# Patient Record
Sex: Female | Born: 1940 | Race: White | Hispanic: No | Smoking: Former smoker
Health system: Southern US, Community
[De-identification: ages and names within clinical notes are randomized; demographics above are authoritative.]

## PROBLEM LIST (undated history)

## (undated) DIAGNOSIS — C73 Malignant neoplasm of thyroid gland: Secondary | ICD-10-CM

## (undated) DIAGNOSIS — I10 Essential (primary) hypertension: Secondary | ICD-10-CM

## (undated) DIAGNOSIS — J45909 Unspecified asthma, uncomplicated: Secondary | ICD-10-CM

## (undated) DIAGNOSIS — M1990 Unspecified osteoarthritis, unspecified site: Secondary | ICD-10-CM

## (undated) DIAGNOSIS — K625 Hemorrhage of anus and rectum: Secondary | ICD-10-CM

## (undated) DIAGNOSIS — K900 Celiac disease: Principal | ICD-10-CM

## (undated) DIAGNOSIS — J449 Chronic obstructive pulmonary disease, unspecified: Secondary | ICD-10-CM

## (undated) DIAGNOSIS — D649 Anemia, unspecified: Secondary | ICD-10-CM

## (undated) HISTORY — PX: ABDOMINAL HYSTERECTOMY: SHX81

## (undated) HISTORY — PX: TONSILLECTOMY: SUR1361

## (undated) HISTORY — PX: CHOLECYSTECTOMY: SHX55

## (undated) HISTORY — PX: BREAST EXCISIONAL BIOPSY: SUR124

## (undated) HISTORY — DX: Celiac disease: K90.0

## (undated) SURGERY — Surgical Case
Anesthesia: *Unknown

---

## 1999-09-28 ENCOUNTER — Encounter: Admission: RE | Admit: 1999-09-28 | Discharge: 1999-09-28 | Payer: Self-pay | Admitting: Family Medicine

## 1999-09-28 ENCOUNTER — Encounter: Payer: Self-pay | Admitting: Family Medicine

## 2000-08-07 ENCOUNTER — Encounter: Admission: RE | Admit: 2000-08-07 | Discharge: 2000-08-07 | Payer: Self-pay | Admitting: Family Medicine

## 2000-08-07 ENCOUNTER — Encounter: Payer: Self-pay | Admitting: Family Medicine

## 2002-03-12 ENCOUNTER — Encounter: Admission: RE | Admit: 2002-03-12 | Discharge: 2002-03-12 | Payer: Self-pay | Admitting: Family Medicine

## 2002-03-12 ENCOUNTER — Encounter: Payer: Self-pay | Admitting: Family Medicine

## 2002-07-08 ENCOUNTER — Encounter (INDEPENDENT_AMBULATORY_CARE_PROVIDER_SITE_OTHER): Payer: Self-pay | Admitting: *Deleted

## 2002-07-08 ENCOUNTER — Ambulatory Visit (HOSPITAL_COMMUNITY): Admission: RE | Admit: 2002-07-08 | Discharge: 2002-07-08 | Payer: Self-pay | Admitting: Gastroenterology

## 2004-02-25 ENCOUNTER — Encounter: Admission: RE | Admit: 2004-02-25 | Discharge: 2004-02-25 | Payer: Self-pay | Admitting: Family Medicine

## 2004-10-26 ENCOUNTER — Encounter: Admission: RE | Admit: 2004-10-26 | Discharge: 2004-10-26 | Payer: Self-pay | Admitting: Family Medicine

## 2005-11-17 ENCOUNTER — Encounter: Admission: RE | Admit: 2005-11-17 | Discharge: 2005-11-17 | Payer: Self-pay | Admitting: Family Medicine

## 2006-02-13 ENCOUNTER — Encounter: Admission: RE | Admit: 2006-02-13 | Discharge: 2006-02-13 | Payer: Self-pay | Admitting: Family Medicine

## 2006-12-14 ENCOUNTER — Encounter: Admission: RE | Admit: 2006-12-14 | Discharge: 2006-12-14 | Payer: Self-pay | Admitting: Family Medicine

## 2007-12-11 ENCOUNTER — Encounter: Admission: RE | Admit: 2007-12-11 | Discharge: 2007-12-11 | Payer: Self-pay | Admitting: Family Medicine

## 2007-12-25 ENCOUNTER — Other Ambulatory Visit: Admission: RE | Admit: 2007-12-25 | Discharge: 2007-12-25 | Payer: Self-pay | Admitting: Interventional Radiology

## 2007-12-25 ENCOUNTER — Encounter: Admission: RE | Admit: 2007-12-25 | Discharge: 2007-12-25 | Payer: Self-pay | Admitting: Family Medicine

## 2007-12-25 ENCOUNTER — Encounter (INDEPENDENT_AMBULATORY_CARE_PROVIDER_SITE_OTHER): Payer: Self-pay | Admitting: Interventional Radiology

## 2008-01-16 ENCOUNTER — Encounter: Admission: RE | Admit: 2008-01-16 | Discharge: 2008-01-16 | Payer: Self-pay | Admitting: Family Medicine

## 2008-03-25 ENCOUNTER — Encounter (INDEPENDENT_AMBULATORY_CARE_PROVIDER_SITE_OTHER): Payer: Self-pay | Admitting: Surgery

## 2008-03-25 ENCOUNTER — Ambulatory Visit (HOSPITAL_COMMUNITY): Admission: RE | Admit: 2008-03-25 | Discharge: 2008-03-26 | Payer: Self-pay | Admitting: Surgery

## 2008-03-31 ENCOUNTER — Encounter: Admission: RE | Admit: 2008-03-31 | Discharge: 2008-03-31 | Payer: Self-pay | Admitting: Family Medicine

## 2008-06-08 ENCOUNTER — Encounter (HOSPITAL_COMMUNITY): Admission: RE | Admit: 2008-06-08 | Discharge: 2008-09-02 | Payer: Self-pay | Admitting: Internal Medicine

## 2008-08-11 ENCOUNTER — Encounter: Admission: RE | Admit: 2008-08-11 | Discharge: 2008-08-11 | Payer: Self-pay | Admitting: Internal Medicine

## 2008-10-05 ENCOUNTER — Encounter: Admission: RE | Admit: 2008-10-05 | Discharge: 2008-10-05 | Payer: Self-pay | Admitting: Internal Medicine

## 2008-11-26 ENCOUNTER — Ambulatory Visit (HOSPITAL_BASED_OUTPATIENT_CLINIC_OR_DEPARTMENT_OTHER): Admission: RE | Admit: 2008-11-26 | Discharge: 2008-11-26 | Payer: Self-pay | Admitting: Orthopedic Surgery

## 2009-02-04 ENCOUNTER — Ambulatory Visit (HOSPITAL_COMMUNITY): Admission: RE | Admit: 2009-02-04 | Discharge: 2009-02-05 | Payer: Self-pay | Admitting: Obstetrics and Gynecology

## 2009-03-02 ENCOUNTER — Encounter: Admission: RE | Admit: 2009-03-02 | Discharge: 2009-03-02 | Payer: Self-pay | Admitting: Family Medicine

## 2009-04-28 ENCOUNTER — Encounter: Admission: RE | Admit: 2009-04-28 | Discharge: 2009-06-04 | Payer: Self-pay | Admitting: Sports Medicine

## 2009-04-29 ENCOUNTER — Encounter: Admission: RE | Admit: 2009-04-29 | Discharge: 2009-04-29 | Payer: Self-pay | Admitting: Internal Medicine

## 2009-06-19 ENCOUNTER — Emergency Department (HOSPITAL_COMMUNITY): Admission: EM | Admit: 2009-06-19 | Discharge: 2009-06-19 | Payer: Self-pay | Admitting: Emergency Medicine

## 2009-07-21 ENCOUNTER — Ambulatory Visit (HOSPITAL_BASED_OUTPATIENT_CLINIC_OR_DEPARTMENT_OTHER): Admission: RE | Admit: 2009-07-21 | Discharge: 2009-07-22 | Payer: Self-pay | Admitting: Orthopedic Surgery

## 2009-09-03 ENCOUNTER — Encounter: Admission: RE | Admit: 2009-09-03 | Discharge: 2009-09-23 | Payer: Self-pay | Admitting: Orthopedic Surgery

## 2009-09-27 ENCOUNTER — Encounter: Admission: RE | Admit: 2009-09-27 | Discharge: 2009-10-01 | Payer: Self-pay | Admitting: Orthopedic Surgery

## 2009-10-25 ENCOUNTER — Encounter (HOSPITAL_COMMUNITY): Admission: RE | Admit: 2009-10-25 | Discharge: 2009-12-29 | Payer: Self-pay | Admitting: Internal Medicine

## 2010-03-03 ENCOUNTER — Encounter: Admission: RE | Admit: 2010-03-03 | Discharge: 2010-03-03 | Payer: Self-pay | Admitting: Advanced Practice Midwife

## 2010-07-19 ENCOUNTER — Encounter: Admission: RE | Admit: 2010-07-19 | Discharge: 2010-07-19 | Payer: Self-pay | Admitting: Orthopedic Surgery

## 2010-07-20 ENCOUNTER — Ambulatory Visit (HOSPITAL_BASED_OUTPATIENT_CLINIC_OR_DEPARTMENT_OTHER): Admission: RE | Admit: 2010-07-20 | Discharge: 2010-07-20 | Payer: Self-pay | Admitting: Orthopedic Surgery

## 2010-08-01 ENCOUNTER — Encounter
Admission: RE | Admit: 2010-08-01 | Discharge: 2010-09-20 | Payer: Self-pay | Source: Home / Self Care | Attending: Orthopedic Surgery | Admitting: Orthopedic Surgery

## 2010-09-27 ENCOUNTER — Encounter: Admission: RE | Admit: 2010-09-27 | Payer: Self-pay | Source: Home / Self Care | Admitting: Orthopedic Surgery

## 2010-10-16 ENCOUNTER — Encounter: Payer: Self-pay | Admitting: Internal Medicine

## 2010-11-18 ENCOUNTER — Other Ambulatory Visit: Payer: Self-pay | Admitting: Internal Medicine

## 2010-11-18 DIAGNOSIS — C73 Malignant neoplasm of thyroid gland: Secondary | ICD-10-CM

## 2010-11-24 ENCOUNTER — Ambulatory Visit
Admission: RE | Admit: 2010-11-24 | Discharge: 2010-11-24 | Disposition: A | Discharge status: Home / Self Care | Payer: Medicare Other | Source: Ambulatory Visit | Attending: Internal Medicine | Admitting: Internal Medicine

## 2010-11-24 DIAGNOSIS — C73 Malignant neoplasm of thyroid gland: Secondary | ICD-10-CM

## 2010-12-07 LAB — BASIC METABOLIC PANEL
Calcium: 8.7 mg/dL (ref 8.4–10.5)
Creatinine, Ser: 0.71 mg/dL (ref 0.4–1.2)
GFR calc Af Amer: >60 mL/min (ref >60)
GFR calc non Af Amer: >60 mL/min (ref >60)
Glucose, Bld: 132 mg/dL — ABNORMAL HIGH (ref 70–99)
Sodium: 138 mEq/L (ref 135–145)

## 2010-12-07 LAB — POCT HEMOGLOBIN-HEMACUE: Hemoglobin: 10.5 g/dL — ABNORMAL LOW (ref 12.0–15.0)

## 2010-12-14 LAB — THYROGLOBULIN LEVEL
Antithyroglobulin Ab: <20 IU/mL (ref <20)
Thyroglobulin: <0.2 ng/mL — ABNORMAL LOW (ref 2.0–35.0)

## 2010-12-29 LAB — BASIC METABOLIC PANEL
Chloride: 104 mEq/L (ref 96–112)
GFR calc non Af Amer: >60 mL/min (ref >60)

## 2010-12-30 LAB — URINE MICROSCOPIC-ADD ON

## 2010-12-30 LAB — DIFFERENTIAL
Eosinophils Absolute: 0.2 10*3/uL (ref 0.0–0.7)
Lymphocytes Relative: 34 % (ref 12–46)
Lymphs Abs: 2.5 10*3/uL (ref 0.7–4.0)
Monocytes Relative: 9 % (ref 3–12)
Neutrophils Relative %: 54 % (ref 43–77)

## 2010-12-30 LAB — BASIC METABOLIC PANEL
Chloride: 98 mEq/L (ref 96–112)
Creatinine, Ser: 0.65 mg/dL (ref 0.4–1.2)
GFR calc Af Amer: >60 mL/min (ref >60)
Potassium: 3.7 mEq/L (ref 3.5–5.1)

## 2010-12-30 LAB — URINALYSIS, ROUTINE W REFLEX MICROSCOPIC
Bilirubin Urine: NEGATIVE
Glucose, UA: NEGATIVE mg/dL
Ketones, ur: NEGATIVE mg/dL
pH: 6.5 (ref 5.0–8.0)

## 2010-12-30 LAB — CBC
MCV: 74.2 fL — ABNORMAL LOW (ref 78.0–100.0)
RBC: 4.73 MIL/uL (ref 3.87–5.11)
WBC: 7.3 10*3/uL (ref 4.0–10.5)

## 2011-01-03 LAB — CBC
HCT: 34.8 % — ABNORMAL LOW (ref 36.0–46.0)
HCT: 38.7 % (ref 36.0–46.0)
Hemoglobin: 11.9 g/dL — ABNORMAL LOW (ref 12.0–15.0)
Hemoglobin: 13.1 g/dL (ref 12.0–15.0)
MCHC: 33.8 g/dL (ref 30.0–36.0)
MCV: 85.3 fL (ref 78.0–100.0)
RBC: 4.54 MIL/uL (ref 3.87–5.11)
WBC: 11.3 10*3/uL — ABNORMAL HIGH (ref 4.0–10.5)

## 2011-01-03 LAB — COMPREHENSIVE METABOLIC PANEL
BUN: 11 mg/dL (ref 6–23)
CO2: 32 mEq/L (ref 19–32)
Calcium: 9 mg/dL (ref 8.4–10.5)
Creatinine, Ser: 0.75 mg/dL (ref 0.4–1.2)
GFR calc non Af Amer: >60 mL/min (ref >60)
Glucose, Bld: 105 mg/dL — ABNORMAL HIGH (ref 70–99)
Total Bilirubin: 0.7 mg/dL (ref 0.3–1.2)

## 2011-01-05 LAB — BASIC METABOLIC PANEL
BUN: 13 mg/dL (ref 6–23)
Chloride: 101 mEq/L (ref 96–112)
Glucose, Bld: 93 mg/dL (ref 70–99)
Potassium: 3.9 mEq/L (ref 3.5–5.1)
Sodium: 137 mEq/L (ref 135–145)

## 2011-01-05 LAB — POCT HEMOGLOBIN-HEMACUE: Hemoglobin: 12.8 g/dL (ref 12.0–15.0)

## 2011-02-07 NOTE — Op Note (Signed)
NAME:  Angelica [name], Angelica [name]                 ACCOUNT NO.:  [id number]   MEDICAL RECORD NO.:  [id number]          PATIENT TYPE:  OIB   LOCATION:  3887                          FACILITY:  [facility]   PHYSICIAN:  Charles A. Delcambre, MDDATE OF BIRTH:  09-08-1941   DATE OF PROCEDURE:  02/04/2009  DATE OF DISCHARGE:                               OPERATIVE REPORT   PREOPERATIVE DIAGNOSIS:  Cystocele.   POSTOPERATIVE DIAGNOSIS:  Cystocele.   PROCEDURE:  Anterior repair.   SURGEON:  Charles A. Bing Matter, MD.   ASSISTANT:  Dr. Landry Mellow.   ANESTHESIA:  General, via the endotracheal route.   FINDINGS:  Large cystocele.   SPECIMENS:  None.   COMPLICATIONS:  None.   ESTIMATED BLOOD LOSS:  Less than or equal to 50 mL.   Instrument, sponge, and needle counts were correct x2.   DESCRIPTION OF PROCEDURE:  The patient was taken to the operating room  and placed in supine position.  General anesthetic was induced without  difficulty and she was then placed in dorsal lithotomy position.  Sterile prep and drape was undertaken.  Foley catheter was placed.  Cuff  was grasped with Allis clamps.  10 mL of 1% lidocaine with epinephrine  was injected subcutaneously in the vaginal mucosa for hemostasis.  A  knife was used to cut across from corner to corner and bladder mucosa  was undermined with Metzenbaum scissors.  Vaginal mucosa was incised in  the midline, bladder was not injured.  The bladder was taken off the  vaginal mucosa with the knife and Metzenbaum scissors, easily reducing  the hard cystocele and without injury to the bladder.  Endopelvic fascia  was then implicated with 7 interrupted 2-0 Vicryl sutures giving a nice  support to the bladder and reduction of the  cystocele.  The bladder mucosa was then excised and the bladder mucosa  was closed with running, locking 2-0 Vicryl with good hemostasis.  1  inch gauze with Estrace cream was placed in the vagina for pack.  The  patient was awakened,  extubated, and taken to recovery with physician in  attendance having tolerated the procedure well.      Charles A. Bing Matter, MD  Electronically Signed     CAD/MEDQ  D:  02/04/2009  T:  02/05/2009  Job:  [id number]

## 2011-02-07 NOTE — Op Note (Signed)
NAME:  DELORIA, BRASSFIELD                 ACCOUNT NO.:  [id number]   MEDICAL RECORD NO.:  [id number]          PATIENT TYPE:  OIB   LOCATION:  5123                         FACILITY:  [facility]   PHYSICIAN:  Earnstine Regal, MD      DATE OF BIRTH:  [birth year]   DATE OF PROCEDURE:  03/25/2008  DATE OF DISCHARGE:                               OPERATIVE REPORT   PREOPERATIVE DIAGNOSES:  Left thyroid nodule with cytologic atypia,  thyroid goiter.   POSTOPERATIVE DIAGNOSES:  Left thyroid nodule with cytologic atypia,  thyroid goiter.   PROCEDURE:  Total thyroidectomy.   SURGEON:  Earnstine Regal, MD, FACS   ASSISTANT:  Sharyn Dross, RNFA   ANESTHESIA:  General per Dr. Lillia Abed.   ESTIMATED BLOOD LOSS:  Minimal.   PREPARATION:  Betadine.   COMPLICATIONS:  None.   INDICATIONS:  The patient is a 70 year old white female from [place],  [place].  She is referred by Dr. Osvaldo Human for evaluation of  thyroid goiter with dominant left side thyroid nodule.  This had been  found on routine physical examination.  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  The patient was referred for  definitive diagnosis by thyroidectomy.   BODY OF REPORT:  Procedure was done in OR #60 at the [facility].  The patient was brought to the operating room,  placed in supine position on the operating room table.  Following  administration of general anesthesia, the patient was positioned and  then prepped and draped in the usual strict aseptic fashion.  After  ascertaining that an adequate level of anesthesia been achieved, a  Kocher incision was made with a #15 blade.  Dissection was carried  through subcutaneous tissues and platysma.  Hemostasis was obtained with  the electrocautery.  Skin flaps were elevated cephalad and caudad from  the thyroid notch to the sternal notch.  A Muller  self-retaining  retractor was placed for exposure.  The strap muscles were incised in  the midline.  Dissection was begun on the left side of the neck.  Left  thyroid lobe was markedly enlarged.  It contains several nodules, some  of which were quite hard and firm.  Using the gentle blunt dissection,  the lobe was mobilized.  Venous tributaries are divided between medium  hemoclips.  Superior pole vessels were dissected out and ligated in  continuity with 2-0 silk ties and divided with the harmonic scalpel.  The inferior venous tributaries were ligated in continuity with 2-0 silk  ties and divided with the harmonic scalpel.  Branches of the inferior  thyroid artery are divided between small and medium Ligaclip with the  harmonic scalpel.  Superior and inferior parathyroid glands were  identified and preserved.  Gland was rolled anteriorly.  Ligament of  Gwenlyn Found was transected with the electrocautery.  Gland was mobilized up  and onto the anterior surface of the trachea.  There was a small  pyramidal lobe which was included with the isthmus.  The isthmus was  mobilized across the midline.   Next, we turned our attention to the right thyroid lobe.  The right  thyroid lobe was much smaller.  It has subtle nodularity but no dominant  masses.  Lobe was gently mobilized with blunt dissection.  Superior pole  vessels were divided between medium Ligaclip with the harmonic scalpel.  Gland was rolled anteriorly.  Inferior venous tributaries were ligated  in continuity with 2-0 silk ties and divided with the harmonic scalpel.  Branches of the inferior thyroid artery were divided between small  Ligaclip with the harmonic scalpel.  Superior both parathyroid glands on  the right were identified and preserved on their vascular pedicle.  Ligament of Gwenlyn Found was transected with the electrocautery.  The remainder  of the gland was excised off the trachea using the harmonic scalpel.  The gland was completely  excised.  The suture was used to mark the left  superior pole.  Neck was irrigated with warm saline.  Surgicel was  placed in the base of the wound bilaterally.  Good hemostasis was noted.  Strap muscles were reapproximated in the midline with interrupted 3-0  Vicryl sutures.  Platysma was closed with interrupted 3-0 Vicryl  sutures.  Skin was closed with a running 4-0 Monocryl subcuticular  suture.  Wound was washed and dried.  Benzoin and Steri-Strips are  applied.  Sterile dressings were applied.  The patient was awakened from  anesthesia and brought to the recovery room in stable condition.  The  patient tolerated the procedure well.      Earnstine Regal, MD  Electronically Signed     TMG/MEDQ  D:  03/25/2008  T:  03/25/2008  Job:  [id number]   cc:   Osvaldo Human, M.D.

## 2011-02-07 NOTE — Op Note (Signed)
NAME:  Angelica [name], Angelica [name]                 ACCOUNT NO.:  [id number]   MEDICAL RECORD NO.:  [id number]          PATIENT TYPE:  AMB   LOCATION:  DSC                          FACILITY:  [facility]   PHYSICIAN:  Daryll Brod, M.D.       DATE OF BIRTH:  20-Aug-1941   DATE OF PROCEDURE:  DATE OF DISCHARGE:                               OPERATIVE REPORT   PREOPERATIVE DIAGNOSIS:  Carpal tunnel syndrome, right hand.   POSTOPERATIVE DIAGNOSIS:  Carpal tunnel syndrome, right hand.   OPERATION:  Decompression of right median nerve.   SURGEON:  Daryll Brod, MD   ASSISTANT:  None.   ANESTHESIA:  Forearm-based IV regional.   ANESTHESIOLOGIST:  Ala Dach, MD   HISTORY:  The patient is a 70 year old female with a history of carpal  tunnel syndrome, EMG nerve conductions positive which has not responded  to conservative treatment.  She has elected to undergo surgical  decompression of the median nerve.  Pre, peri, and postoperative course  have been discussed along with risks and complications.  She is aware  that there is no guarantee with surgery, possibility of infection,  recurrence of injury to arteries, nerves, tendons, complete relief of  symptoms, dystrophy.  In the preoperative area, the patient is seen, the  extremity marked by both the patient and surgeon, antibiotic given.   PROCEDURE:  The patient was brought to the operating room where a  forearm-based IV regional anesthetic was carried out without difficulty.  She was prepped using DuraPrep, supine position with the right arm free.  A time-out was taken.  A longitudinal incision was made in the palm,  carried down through subcutaneous tissue.  Bleeders were  electrocauterized.  Palmar fascia was split.  Superficial palmar arch  identified.  The flexor tendon to the ring little finger identified to  the ulnar side of the median nerve.  The carpal retinaculum was incised  with sharp dissection.  A right angle and Sewell retractor were  placed  between skin and forearm fascia.  The fascia released for approximately  a centimeter and half proximal to the wrist crease under direct vision.  Area of compression to the nerve was apparent with an hourglass  deformity.  No further lesions were identified.  The wound was  irrigated.  The skin was  then closed with interrupted 5-0 Vicryl Rapide sutures.  A sterile  compressive dressing and splint to the wrist was applied.  The patient  tolerated the procedure well and was taken to the recovery room for  observation in satisfactory condition.  She will be discharged home to  return to the [facility] in 1 week on Vicodin.           ______________________________  Daryll Brod, M.D.     GK/MEDQ  D:  11/26/2008  T:  11/27/2008  Job:  [id number]   cc:   Osvaldo Human, M.D.

## 2011-02-14 ENCOUNTER — Other Ambulatory Visit: Payer: Self-pay | Admitting: Family Medicine

## 2011-02-14 DIAGNOSIS — Z1231 Encounter for screening mammogram for malignant neoplasm of breast: Secondary | ICD-10-CM

## 2011-02-14 NOTE — Op Note (Signed)
NAME:  Angelica [name], Angelica [name]                           ACCOUNT NO.:  [id number]   MEDICAL RECORD NO.:  [id number]                   PATIENT TYPE:  AMB   LOCATION:  ENDO                                 FACILITY:  [facility]   PHYSICIAN:  Earle Gell, M.D.                DATE OF BIRTH:  1940/10/15   DATE OF PROCEDURE:  07/08/2002  DATE OF DISCHARGE:                                 OPERATIVE REPORT   PROCEDURE:  Screening colonoscopy with rectal polypectomy.   PROCEDURE INDICATION:  [name] is [name] 70 year old female born  12-19-1940.  [name] is scheduled to undergo her first screening  colonoscopy with polypectomy to prevent colon cancer.  I discussed with her  the complications associated with colonoscopy and polypectomy, including [name]  15 per 1,000 risk of bleeding and 4 per 1,000 risk of colon perforation  requiring surgical repair.  [name] has signed the operative permit.   ENDOSCOPIST:  Earle Gell, M.D.   PREMEDICATION:  Versed 7 mg, fentanyl 50 mcg.   ENDOSCOPE:  Olympus pediatric colonoscope.   PROCEDURE:  After obtaining informed consent, [name] was placed in the  left lateral decubitus position.  I administered intravenous fentanyl and  intravenous Versed to achieve conscious sedation for the procedure.  The  patient's blood pressure, oxygen saturation, and cardiac rhythm were  monitored throughout the procedure and documented in the medical record.   Anal inspection was normal.  Digital rectal exam was normal.  The Olympus  pediatric video colonoscope was introduced into the rectum and advanced to  the cecum.  Colonic preparation for the exam today was excellent.   RECTUM:  [name] 3 mm sessile polyp was removed from the distal rectum with the  electrocautery snare after saline lift; two 1 mm sessile polyps were removed  from the proximal rectum with the cold biopsy forceps.  All polyps were  submitted in one bottle for pathologic evaluation.   SIGMOID  COLON AND DESCENDING COLON:  Left colonic diverticulosis.   SPLENIC FLEXURE:  Normal.   TRANSVERSE COLON:  Normal.   HEPATIC FLEXURE:  Normal.   ASCENDING COLON:  Normal.   CECUM AND ILEOCECAL VALVE:  Normal.   ASSESSMENT:  1. Left colonic diverticulosis.  2. Three small sessile polyps were removed from the rectum and submitted in     one bottle for pathologic evaluation.   RECOMMENDATIONS:  If rectal polyps return neoplastic pathologically, Ms.  Angelica [name] should undergo [name] repeat colonoscopy in 3-5 years.                                               Earle Gell, M.D.    MJ/MEDQ  D:  07/08/2002  T:  07/08/2002  Job:  [id number]   cc:  Osvaldo Human, MD

## 2011-02-27 ENCOUNTER — Ambulatory Visit
Admission: RE | Admit: 2011-02-27 | Discharge: 2011-02-27 | Disposition: A | Discharge status: Home / Self Care | Payer: Medicare Other | Source: Ambulatory Visit | Attending: Family Medicine | Admitting: Family Medicine

## 2011-02-27 DIAGNOSIS — Z1231 Encounter for screening mammogram for malignant neoplasm of breast: Secondary | ICD-10-CM

## 2011-03-06 ENCOUNTER — Ambulatory Visit: Payer: Medicare Other

## 2011-06-22 LAB — CBC
HCT: 43.7
Hemoglobin: 14.8
RBC: 4.87
RDW: 13.6

## 2011-06-22 LAB — DIFFERENTIAL
Basophils Absolute: 0.1
Eosinophils Relative: 4
Lymphocytes Relative: 38
Monocytes Absolute: 0.7
Monocytes Relative: 8
Neutro Abs: 4.2

## 2011-06-22 LAB — BASIC METABOLIC PANEL
BUN: 11
Chloride: 99
GFR calc non Af Amer: >60
Glucose, Bld: 100 — ABNORMAL HIGH
Potassium: 3.5
Sodium: 138

## 2011-06-22 LAB — URINALYSIS, ROUTINE W REFLEX MICROSCOPIC
Glucose, UA: NEGATIVE
Hgb urine dipstick: NEGATIVE
Protein, ur: NEGATIVE
Specific Gravity, Urine: 1.016
pH: 6

## 2011-06-22 LAB — CALCIUM
Calcium: 9
Calcium: 9

## 2011-06-22 LAB — CALCITONIN: Calcitonin: <2 pg/mL (ref <13.0)

## 2011-10-04 DIAGNOSIS — J441 Chronic obstructive pulmonary disease with (acute) exacerbation: Secondary | ICD-10-CM | POA: Diagnosis not present

## 2011-11-02 DIAGNOSIS — D509 Iron deficiency anemia, unspecified: Secondary | ICD-10-CM | POA: Diagnosis not present

## 2011-11-06 ENCOUNTER — Other Ambulatory Visit: Payer: Self-pay | Admitting: Internal Medicine

## 2011-11-06 DIAGNOSIS — C73 Malignant neoplasm of thyroid gland: Secondary | ICD-10-CM

## 2011-11-10 ENCOUNTER — Ambulatory Visit
Admission: RE | Admit: 2011-11-10 | Discharge: 2011-11-10 | Disposition: A | Discharge status: Home / Self Care | Payer: Medicare Other | Source: Ambulatory Visit | Attending: Internal Medicine | Admitting: Internal Medicine

## 2011-11-10 DIAGNOSIS — C73 Malignant neoplasm of thyroid gland: Secondary | ICD-10-CM | POA: Diagnosis not present

## 2011-11-10 DIAGNOSIS — Z09 Encounter for follow-up examination after completed treatment for conditions other than malignant neoplasm: Secondary | ICD-10-CM | POA: Diagnosis not present

## 2011-11-13 DIAGNOSIS — C73 Malignant neoplasm of thyroid gland: Secondary | ICD-10-CM | POA: Diagnosis not present

## 2011-11-13 DIAGNOSIS — E890 Postprocedural hypothyroidism: Secondary | ICD-10-CM | POA: Diagnosis not present

## 2011-11-15 DIAGNOSIS — C73 Malignant neoplasm of thyroid gland: Secondary | ICD-10-CM | POA: Diagnosis not present

## 2011-11-15 DIAGNOSIS — Z833 Family history of diabetes mellitus: Secondary | ICD-10-CM | POA: Diagnosis not present

## 2011-11-15 DIAGNOSIS — R21 Rash and other nonspecific skin eruption: Secondary | ICD-10-CM | POA: Diagnosis not present

## 2011-11-15 DIAGNOSIS — E890 Postprocedural hypothyroidism: Secondary | ICD-10-CM | POA: Diagnosis not present

## 2011-11-15 DIAGNOSIS — R7301 Impaired fasting glucose: Secondary | ICD-10-CM | POA: Diagnosis not present

## 2012-01-08 DIAGNOSIS — F329 Major depressive disorder, single episode, unspecified: Secondary | ICD-10-CM | POA: Diagnosis not present

## 2012-01-08 DIAGNOSIS — E7800 Pure hypercholesterolemia, unspecified: Secondary | ICD-10-CM | POA: Diagnosis not present

## 2012-01-08 DIAGNOSIS — I10 Essential (primary) hypertension: Secondary | ICD-10-CM | POA: Diagnosis not present

## 2012-01-08 DIAGNOSIS — E669 Obesity, unspecified: Secondary | ICD-10-CM | POA: Diagnosis not present

## 2012-02-12 ENCOUNTER — Other Ambulatory Visit: Payer: Self-pay | Admitting: Family Medicine

## 2012-02-12 DIAGNOSIS — Z1231 Encounter for screening mammogram for malignant neoplasm of breast: Secondary | ICD-10-CM

## 2012-02-29 ENCOUNTER — Ambulatory Visit
Admission: RE | Admit: 2012-02-29 | Discharge: 2012-02-29 | Disposition: A | Discharge status: Home / Self Care | Payer: Medicare Other | Source: Ambulatory Visit | Attending: Family Medicine | Admitting: Family Medicine

## 2012-02-29 DIAGNOSIS — Z1231 Encounter for screening mammogram for malignant neoplasm of breast: Secondary | ICD-10-CM

## 2012-05-14 DIAGNOSIS — R7301 Impaired fasting glucose: Secondary | ICD-10-CM | POA: Diagnosis not present

## 2012-05-14 DIAGNOSIS — E890 Postprocedural hypothyroidism: Secondary | ICD-10-CM | POA: Diagnosis not present

## 2012-05-14 DIAGNOSIS — C73 Malignant neoplasm of thyroid gland: Secondary | ICD-10-CM | POA: Diagnosis not present

## 2012-05-17 DIAGNOSIS — Z833 Family history of diabetes mellitus: Secondary | ICD-10-CM | POA: Diagnosis not present

## 2012-05-17 DIAGNOSIS — R0789 Other chest pain: Secondary | ICD-10-CM | POA: Diagnosis not present

## 2012-05-17 DIAGNOSIS — C73 Malignant neoplasm of thyroid gland: Secondary | ICD-10-CM | POA: Diagnosis not present

## 2012-05-17 DIAGNOSIS — R002 Palpitations: Secondary | ICD-10-CM | POA: Diagnosis not present

## 2012-05-17 DIAGNOSIS — R7301 Impaired fasting glucose: Secondary | ICD-10-CM | POA: Diagnosis not present

## 2012-05-17 DIAGNOSIS — E890 Postprocedural hypothyroidism: Secondary | ICD-10-CM | POA: Diagnosis not present

## 2012-06-12 DIAGNOSIS — R002 Palpitations: Secondary | ICD-10-CM | POA: Diagnosis not present

## 2012-06-12 DIAGNOSIS — R0789 Other chest pain: Secondary | ICD-10-CM | POA: Diagnosis not present

## 2012-06-12 DIAGNOSIS — E7800 Pure hypercholesterolemia, unspecified: Secondary | ICD-10-CM | POA: Diagnosis not present

## 2012-06-12 DIAGNOSIS — R9431 Abnormal electrocardiogram [ECG] [EKG]: Secondary | ICD-10-CM | POA: Diagnosis not present

## 2012-06-12 DIAGNOSIS — J449 Chronic obstructive pulmonary disease, unspecified: Secondary | ICD-10-CM | POA: Diagnosis not present

## 2012-06-12 DIAGNOSIS — I10 Essential (primary) hypertension: Secondary | ICD-10-CM | POA: Diagnosis not present

## 2012-06-20 DIAGNOSIS — R002 Palpitations: Secondary | ICD-10-CM | POA: Diagnosis not present

## 2012-06-20 DIAGNOSIS — R9431 Abnormal electrocardiogram [ECG] [EKG]: Secondary | ICD-10-CM | POA: Diagnosis not present

## 2012-06-20 DIAGNOSIS — R0789 Other chest pain: Secondary | ICD-10-CM | POA: Diagnosis not present

## 2012-06-20 DIAGNOSIS — J449 Chronic obstructive pulmonary disease, unspecified: Secondary | ICD-10-CM | POA: Diagnosis not present

## 2012-06-20 DIAGNOSIS — E7800 Pure hypercholesterolemia, unspecified: Secondary | ICD-10-CM | POA: Diagnosis not present

## 2012-06-20 DIAGNOSIS — I10 Essential (primary) hypertension: Secondary | ICD-10-CM | POA: Diagnosis not present

## 2012-06-28 DIAGNOSIS — C73 Malignant neoplasm of thyroid gland: Secondary | ICD-10-CM | POA: Diagnosis not present

## 2012-07-03 ENCOUNTER — Encounter (HOSPITAL_COMMUNITY): Payer: Self-pay | Admitting: Emergency Medicine

## 2012-07-03 ENCOUNTER — Inpatient Hospital Stay (HOSPITAL_COMMUNITY)
Admission: EM | Admit: 2012-07-03 | Discharge: 2012-07-06 | DRG: 812 | Disposition: A | Discharge status: Home / Self Care | Payer: Medicare Other | Attending: Internal Medicine | Admitting: Internal Medicine

## 2012-07-03 DIAGNOSIS — K449 Diaphragmatic hernia without obstruction or gangrene: Secondary | ICD-10-CM | POA: Diagnosis present

## 2012-07-03 DIAGNOSIS — E876 Hypokalemia: Secondary | ICD-10-CM | POA: Diagnosis not present

## 2012-07-03 DIAGNOSIS — J449 Chronic obstructive pulmonary disease, unspecified: Secondary | ICD-10-CM | POA: Diagnosis present

## 2012-07-03 DIAGNOSIS — Z85850 Personal history of malignant neoplasm of thyroid: Secondary | ICD-10-CM

## 2012-07-03 DIAGNOSIS — D649 Anemia, unspecified: Secondary | ICD-10-CM | POA: Diagnosis not present

## 2012-07-03 DIAGNOSIS — E669 Obesity, unspecified: Secondary | ICD-10-CM | POA: Diagnosis not present

## 2012-07-03 DIAGNOSIS — Z9981 Dependence on supplemental oxygen: Secondary | ICD-10-CM

## 2012-07-03 DIAGNOSIS — K921 Melena: Secondary | ICD-10-CM | POA: Diagnosis not present

## 2012-07-03 DIAGNOSIS — R531 Weakness: Secondary | ICD-10-CM

## 2012-07-03 DIAGNOSIS — Z66 Do not resuscitate: Secondary | ICD-10-CM | POA: Diagnosis present

## 2012-07-03 DIAGNOSIS — K219 Gastro-esophageal reflux disease without esophagitis: Secondary | ICD-10-CM | POA: Diagnosis present

## 2012-07-03 DIAGNOSIS — K5730 Diverticulosis of large intestine without perforation or abscess without bleeding: Secondary | ICD-10-CM | POA: Diagnosis present

## 2012-07-03 DIAGNOSIS — D509 Iron deficiency anemia, unspecified: Secondary | ICD-10-CM | POA: Diagnosis not present

## 2012-07-03 DIAGNOSIS — R079 Chest pain, unspecified: Secondary | ICD-10-CM | POA: Diagnosis not present

## 2012-07-03 DIAGNOSIS — R9430 Abnormal result of cardiovascular function study, unspecified: Secondary | ICD-10-CM | POA: Diagnosis not present

## 2012-07-03 DIAGNOSIS — J4489 Other specified chronic obstructive pulmonary disease: Secondary | ICD-10-CM | POA: Diagnosis present

## 2012-07-03 DIAGNOSIS — R5383 Other fatigue: Secondary | ICD-10-CM

## 2012-07-03 DIAGNOSIS — E7800 Pure hypercholesterolemia, unspecified: Secondary | ICD-10-CM | POA: Diagnosis not present

## 2012-07-03 DIAGNOSIS — I10 Essential (primary) hypertension: Secondary | ICD-10-CM

## 2012-07-03 HISTORY — DX: Chronic obstructive pulmonary disease, unspecified: J44.9

## 2012-07-03 HISTORY — DX: Unspecified asthma, uncomplicated: J45.909

## 2012-07-03 HISTORY — DX: Unspecified osteoarthritis, unspecified site: M19.90

## 2012-07-03 HISTORY — DX: Anemia, unspecified: D64.9

## 2012-07-03 HISTORY — DX: Essential (primary) hypertension: I10

## 2012-07-03 LAB — COMPREHENSIVE METABOLIC PANEL
ALT: 11 U/L (ref 0–35)
BUN: 11 mg/dL (ref 6–23)
CO2: 28 mEq/L (ref 19–32)
Calcium: 9.2 mg/dL (ref 8.4–10.5)
Creatinine, Ser: 0.68 mg/dL (ref 0.50–1.10)
GFR calc Af Amer: >90 mL/min (ref >90)
GFR calc non Af Amer: 85 mL/min — ABNORMAL LOW (ref >90)
Glucose, Bld: 101 mg/dL — ABNORMAL HIGH (ref 70–99)
Sodium: 137 mEq/L (ref 135–145)

## 2012-07-03 LAB — CBC
HCT: 28 % — ABNORMAL LOW (ref 36.0–46.0)
Hemoglobin: 7.5 g/dL — ABNORMAL LOW (ref 12.0–15.0)
MCH: 17.4 pg — ABNORMAL LOW (ref 26.0–34.0)
MCV: 65.1 fL — ABNORMAL LOW (ref 78.0–100.0)
RBC: 4.3 MIL/uL (ref 3.87–5.11)

## 2012-07-03 LAB — PRO B NATRIURETIC PEPTIDE: Pro B Natriuretic peptide (BNP): 73.7 pg/mL (ref 0–125)

## 2012-07-03 LAB — OCCULT BLOOD, POC DEVICE: Fecal Occult Bld: POSITIVE

## 2012-07-03 MED ORDER — PANTOPRAZOLE SODIUM 40 MG IV SOLR
40.0000 mg | Freq: Once | INTRAVENOUS | Status: AC
  Administered 2012-07-03: 40 mg via INTRAVENOUS
  Filled 2012-07-03: qty 40

## 2012-07-03 NOTE — ED Notes (Signed)
Pt states she went to her cardiologist for pre-op on Friday for a cardiac cath, the doctor cancelled procedure and referred her to ED to receive blood because she was anemic. States her hemoglobin is "7 something." Denies dizziness.

## 2012-07-03 NOTE — ED Notes (Signed)
Pt denies vomiting blood or having any blood in her stool

## 2012-07-03 NOTE — H&P (Signed)
PCP:   Mayra Neer, MD    Chief Complaint:   Abnormal labs  HPI: Angelica [name] is a 71 y.o. female   has a past medical history of Hypertension; Arthritis; Asthma; Cancer; COPD (chronic obstructive pulmonary disease); and Thyroid disease.   Presented with  She has had some shortness of breath for good while and was getting ready to get cardiac catheterization done to evaluate that. Some screening labs were done showing hg of 7.5. She never had any blood in stool denies black stool except if she eats chocolate. She is hemoccult positive.  Denies any weight loss. No abdominal pain. Last time she had a colonoscopy she cannot recollect thinks she had hx of polyps.    She has been eating lots of ICE and has craving for liver.   Review of Systems:    Pertinent positives include: fatigue, shortness of breath at rest.  dyspnea on exertion,   Constitutional:  No weight loss, night sweats, Fevers, chills, weight loss  HEENT:  No headaches, Difficulty swallowing,Tooth/dental problems,Sore throat,  No sneezing, itching, ear ache, nasal congestion, post nasal drip,  Cardio-vascular:  No chest pain, Orthopnea, PND, anasarca, dizziness, palpitations.no Bilateral lower extremity swelling  GI:  No heartburn, indigestion, abdominal pain, nausea, vomiting, diarrhea, change in bowel habits, loss of appetite, melena, blood in stool, hematemesis Resp:  No excess mucus, no productive cough, No non-productive cough, No coughing up of blood.No change in color of mucus.No wheezing. Skin:  no rash or lesions. No jaundice GU:  no dysuria, change in color of urine, no urgency or frequency. No straining to urinate.  No flank pain.  Musculoskeletal:  No joint pain or no joint swelling. No decreased range of motion. No back pain.  Psych:  No change in mood or affect. No depression or anxiety. No memory loss.  Neuro: no localizing neurological complaints, no tingling, no weakness, no double vision, no  gait abnormality, no slurred speech, no confusion  Otherwise ROS are negative except for above, 10 systems were reviewed  Past Medical History: Past Medical History  Diagnosis Date  . Hypertension   . Arthritis   . Asthma   . Cancer     thyroid  . COPD (chronic obstructive pulmonary disease)   . Thyroid disease    Past Surgical History  Procedure Date  . Abdominal hysterectomy   . Tonsillectomy   . Cholecystectomy      Medications: Prior to Admission medications   Medication Sig Start Date End Date Taking? Authorizing Provider  amLODipine (NORVASC) 2.5 MG tablet Take 2.5 mg by mouth daily.   Yes Historical Provider, MD  levothyroxine (SYNTHROID, LEVOTHROID) 125 MCG tablet Take 125 mcg by mouth daily.   Yes Historical Provider, MD    Allergies:   Allergies  Allergen Reactions  . Fosamax (Alendronate Sodium)   . Other     Pt states she has several other allergies but can't recall the names   . Penicillins   . Sulfa Antibiotics     Social History:  Ambulatory  independently  Lives at  [place] and [place] [phone] call in case of emergency   reports that she quit smoking about 4 years ago. She has never used smokeless tobacco. She reports that she does not drink alcohol or use illicit drugs.   Family History: family history includes Heart disease in her mother and Kidney failure in her father.    Physical Exam: Patient Vitals for the past 24 hrs:  BP Temp Temp src Pulse Resp SpO2  07/03/12 2330 129/80 mmHg 98 F (36.7 C) Axillary 89  20  -  07/03/12 2315 140/67 mmHg - - 84  24  98 %  07/03/12 2306 141/80 mmHg 97.8 F (36.6 C) Oral 88  20  -  07/03/12 2231 141/80 mmHg - - 97  - -  07/03/12 2230 140/76 mmHg - - 90  - -  07/03/12 2230 140/76 mmHg - - 95  24  96 %  07/03/12 2228 135/67 mmHg - - 82  - -  07/03/12 2145 115/79 mmHg - - 85  26  98 %  07/03/12 1833 - 97.7 F (36.5 C) Oral - - -  07/03/12 1830 185/75 mmHg - Oral 98  18   96 %    1. General:  in No Acute distress 2. Psychological: Alert and Oriented 3. Head/ENT:   Moist  Mucous Membranes                          Head Non traumatic, neck supple                          Normal Dentition 4. SKIN: normal  Skin turgor,  Skin clean Dry and intact no rash, appears pale 5. Heart: Regular rate and rhythm no Murmur, Rub or gallop 6. Lungs: Clear to auscultation bilaterally, no wheezes or crackles   7. Abdomen: Soft, non-tender, Non distended 8. Lower extremities: no clubbing, cyanosis, or edema 9. Neurologically Grossly intact, moving all 4 extremities equally 10. MSK: Normal range of motion  body mass index is unknown because there is no height or weight on file.   Labs on Admission:   [facility] 07/03/12 1836  NA 137  K 3.3*  CL 99  CO2 28  GLUCOSE 101*  BUN 11  CREATININE 0.68  CALCIUM 9.2  MG --  PHOS --    Basename 07/03/12 1836  AST 17  ALT 11  ALKPHOS 77  BILITOT 0.6  PROT 7.3  ALBUMIN 3.7   No results found for this basename: LIPASE:2,AMYLASE:2 in the last 72 hours  Basename 07/03/12 1836  WBC 8.6  NEUTROABS --  HGB 7.5*  HCT 28.0*  MCV 65.1*  PLT 427*    Basename 07/03/12 2123  CKTOTAL --  CKMB --  CKMBINDEX --  TROPONINI <0.30   No results found for this basename: TSH,T4TOTAL,FREET3,T3FREE,THYROIDAB in the last 72 hours No results found for this basename: VITAMINB12:2,FOLATE:2,FERRITIN:2,TIBC:2,IRON:2,RETICCTPCT:2 in the last 72 hours No results found for this basename: HGBA1C    CrCl is unknown because there is no height on file for the current visit. ABG No results found for this basename: phart, pco2, po2, hco3, tco2, acidbasedef, o2sat     No results found for this basename: DDIMER     Other results:  I have pearsonaly reviewed this: ECG REPORT  Rate: 80  Rhythm: NSR ST&T Change: t wave inv in lead A1  BNP 73  Cultures: No results found for this basename: sdes, specrequest, cult, reptstatus         Radiological Exams on Admission: No results found.  Chart has been reviewed  Assessment/Plan  71 yo w no sig prior hx here with sever symptomatic anemia and hemoccult pos stool  Present on Admission:  .Anemia - patient is actively being transfused currently will give total of 2 units and follow HG, would initiate protonix IV BID,  suspect upper source. Please call GI in AM, given degree of anemia will watch in step down.  Hypokalemia - will replace .Hypertension - hold norvasc .Blood in stool - Call GI in AM or sooner if decompensates, NPO   Prophylaxis: SCD Protonix  CODE STATUS: DNR/ DNI as per patient  Other plan as per orders.  I have spent a total of 55 min on this admission  Suesan Mohrmann 07/03/2012, 11:57 PM

## 2012-07-03 NOTE — ED Provider Notes (Signed)
History     CSN: [id number]  Arrival date & time 07/03/12  1820   First MD Initiated Contact with Patient 07/03/12 2046      Chief Complaint  Patient presents with  . Referral    (Consider location/radiation/quality/duration/timing/severity/associated sxs/prior treatment) HPI Comments: Sent by Dr. Anne Fu with anemia.  Patient was being prepared for cardiac cath to "see if she had a heart attack in the past".  Hb was 7.5.  She denies chest pain, SOB, dizziness, lightheadedness, fever, chills, vomiting.  She denies any blood in her stools.  No history of GI bleeding.   The history is provided by the patient.    Past Medical History  Diagnosis Date  . Hypertension   . Arthritis   . Asthma   . Cancer     thyroid  . COPD (chronic obstructive pulmonary disease)   . Thyroid disease     Past Surgical History  Procedure Date  . Abdominal hysterectomy   . Tonsillectomy   . Cholecystectomy     Family History  Problem Relation Age of Onset  . Heart disease Mother   . Kidney failure Father     History  Substance Use Topics  . Smoking status: Former Smoker    Quit date: 07/03/2008  . Smokeless tobacco: Never Used  . Alcohol Use: No    OB History    Grav Para Term Preterm Abortions TAB SAB Ect Mult Living                  Review of Systems  Constitutional: Negative for fever, activity change and appetite change.  HENT: Negative for congestion and rhinorrhea.   Respiratory: Positive for shortness of breath. Negative for cough and chest tightness.   Cardiovascular: Negative for chest pain.  Gastrointestinal: Negative for nausea, vomiting, abdominal pain and blood in stool.  Genitourinary: Negative for dysuria, vaginal bleeding and vaginal discharge.  Musculoskeletal: Negative for back pain.  Skin: Negative for rash.  Neurological: Positive for light-headedness. Negative for dizziness, weakness and headaches.    Allergies  Fosamax; Other; Penicillins; and Sulfa  antibiotics  Home Medications   No current outpatient prescriptions on file.  BP 138/91  Pulse 81  Temp 98.6 F (37 C) (Oral)  Resp 13  Ht 5\' 2"  (1.575 m)  Wt 211 lb 10.3 oz (96 kg)  BMI 38.71 kg/m2  SpO2 98%  Physical Exam  Constitutional: She is oriented to person, place, and time. She appears well-developed and well-nourished. No distress.  HENT:  Head: Normocephalic and atraumatic.  Mouth/Throat: Oropharynx is clear and moist. No oropharyngeal exudate.  Eyes: Conjunctivae normal and EOM are normal. Pupils are equal, round, and reactive to light.  Neck: Normal range of motion. Neck supple.  Cardiovascular: Normal rate, regular rhythm and normal heart sounds.   No murmur heard. Pulmonary/Chest: Effort normal and breath sounds normal. No respiratory distress.  Abdominal: Soft. There is no tenderness. There is no rebound and no guarding.  Genitourinary: Guaiac positive stool.       Small external hemorrhoids. No fissures.   Neurological: She is alert and oriented to person, place, and time. No cranial nerve deficit.  Skin: Skin is warm.    ED Course  Procedures (including critical care time)  Labs Reviewed  CBC - Abnormal; Notable for the following:    Hemoglobin 7.5 (*)     HCT 28.0 (*)     MCV 65.1 (*)     MCH 17.4 (*)  MCHC 26.8 (*)     RDW 17.9 (*)     Platelets 427 (*)     All other components within normal limits  COMPREHENSIVE METABOLIC PANEL - Abnormal; Notable for the following:    Potassium 3.3 (*)     Glucose, Bld 101 (*)     GFR calc non Af Amer 85 (*)     All other components within normal limits  TYPE AND SCREEN  ABO/RH  TROPONIN I  PRO B NATRIURETIC PEPTIDE  OCCULT BLOOD, POC DEVICE  PREPARE RBC (CROSSMATCH)  MRSA PCR SCREENING  MAGNESIUM  PHOSPHORUS  TSH  COMPREHENSIVE METABOLIC PANEL  CBC  CBC  CBC  CBC   No results found.   1. Anemia   2. Weakness   3. Hypertension   4. Hypokalemia   5. Blood in stool       MDM    Anemia with chronic SOB.  Vitals stable. No distress  Hb 7.5 from baseline of 11, but no checks in 2 years.  FOBT positive. PPI, transfuse given SOB. Admission d/w hospitalist. GI to consult in AM.       Date: 07/03/2012  Rate: 80  Rhythm: normal sinus rhythm  QRS Axis: normal  Intervals: normal  ST/T Wave abnormalities: nonspecific ST/T changes  Conduction Disutrbances:none  Narrative Interpretation:   Old EKG Reviewed: unchanged    Glynn Octave, MD 07/04/12 [phone]

## 2012-07-03 NOTE — ED Notes (Signed)
Pt not in room.

## 2012-07-04 DIAGNOSIS — J449 Chronic obstructive pulmonary disease, unspecified: Secondary | ICD-10-CM

## 2012-07-04 DIAGNOSIS — I10 Essential (primary) hypertension: Secondary | ICD-10-CM | POA: Diagnosis not present

## 2012-07-04 DIAGNOSIS — D649 Anemia, unspecified: Secondary | ICD-10-CM | POA: Diagnosis not present

## 2012-07-04 DIAGNOSIS — Z66 Do not resuscitate: Secondary | ICD-10-CM | POA: Diagnosis present

## 2012-07-04 DIAGNOSIS — D509 Iron deficiency anemia, unspecified: Secondary | ICD-10-CM | POA: Diagnosis present

## 2012-07-04 DIAGNOSIS — K219 Gastro-esophageal reflux disease without esophagitis: Secondary | ICD-10-CM | POA: Diagnosis present

## 2012-07-04 DIAGNOSIS — R5381 Other malaise: Secondary | ICD-10-CM

## 2012-07-04 DIAGNOSIS — Z85850 Personal history of malignant neoplasm of thyroid: Secondary | ICD-10-CM

## 2012-07-04 DIAGNOSIS — E876 Hypokalemia: Secondary | ICD-10-CM | POA: Diagnosis not present

## 2012-07-04 DIAGNOSIS — R5383 Other fatigue: Secondary | ICD-10-CM | POA: Diagnosis present

## 2012-07-04 DIAGNOSIS — K921 Melena: Secondary | ICD-10-CM | POA: Diagnosis present

## 2012-07-04 DIAGNOSIS — J4489 Other specified chronic obstructive pulmonary disease: Secondary | ICD-10-CM

## 2012-07-04 DIAGNOSIS — Z9981 Dependence on supplemental oxygen: Secondary | ICD-10-CM | POA: Diagnosis not present

## 2012-07-04 DIAGNOSIS — K5730 Diverticulosis of large intestine without perforation or abscess without bleeding: Secondary | ICD-10-CM | POA: Diagnosis not present

## 2012-07-04 DIAGNOSIS — K449 Diaphragmatic hernia without obstruction or gangrene: Secondary | ICD-10-CM | POA: Diagnosis not present

## 2012-07-04 LAB — CBC
HCT: 30.9 % — ABNORMAL LOW (ref 36.0–46.0)
MCH: 19.9 pg — ABNORMAL LOW (ref 26.0–34.0)
MCH: 19.6 pg — ABNORMAL LOW (ref 26.0–34.0)
MCH: 20.2 pg — ABNORMAL LOW (ref 26.0–34.0)
MCHC: 29.1 g/dL — ABNORMAL LOW (ref 30.0–36.0)
MCHC: 28.8 g/dL — ABNORMAL LOW (ref 30.0–36.0)
MCV: 68.4 fL — ABNORMAL LOW (ref 78.0–100.0)
MCV: 68.5 fL — ABNORMAL LOW (ref 78.0–100.0)
Platelets: 356 10*3/uL (ref 150–400)
Platelets: 354 10*3/uL (ref 150–400)
RBC: 4.74 MIL/uL (ref 3.87–5.11)
RDW: 20.2 % — ABNORMAL HIGH (ref 11.5–15.5)
RDW: 20 % — ABNORMAL HIGH (ref 11.5–15.5)
RDW: 20.4 % — ABNORMAL HIGH (ref 11.5–15.5)

## 2012-07-04 LAB — COMPREHENSIVE METABOLIC PANEL
ALT: 10 U/L (ref 0–35)
AST: 13 U/L (ref 0–37)
Alkaline Phosphatase: 70 U/L (ref 39–117)
GFR calc Af Amer: >90 mL/min (ref >90)
Glucose, Bld: 99 mg/dL (ref 70–99)
Potassium: 3.3 mEq/L — ABNORMAL LOW (ref 3.5–5.1)
Sodium: 141 mEq/L (ref 135–145)
Total Protein: 6.6 g/dL (ref 6.0–8.3)

## 2012-07-04 LAB — MAGNESIUM: Magnesium: 1.9 mg/dL (ref 1.5–2.5)

## 2012-07-04 MED ORDER — POTASSIUM CHLORIDE 20 MEQ/15ML (10%) PO LIQD
ORAL | Status: AC
  Filled 2012-07-04: qty 30

## 2012-07-04 MED ORDER — GUAIFENESIN-DM 100-10 MG/5ML PO SYRP
5.0000 mL | ORAL_SOLUTION | ORAL | Status: DC | PRN
  Filled 2012-07-04: qty 5

## 2012-07-04 MED ORDER — POTASSIUM CHLORIDE 10 MEQ/100ML IV SOLN
INTRAVENOUS | Status: AC
  Filled 2012-07-04: qty 100

## 2012-07-04 MED ORDER — HYDROCODONE-ACETAMINOPHEN 5-325 MG PO TABS: 1.0000 | ORAL_TABLET | ORAL | Status: DC | PRN

## 2012-07-04 MED ORDER — DOCUSATE SODIUM 100 MG PO CAPS
100.0000 mg | ORAL_CAPSULE | Freq: Two times a day (BID) | ORAL | Status: DC
  Administered 2012-07-04 – 2012-07-06 (×5): 100 mg via ORAL
  Filled 2012-07-04 (×8): qty 1

## 2012-07-04 MED ORDER — ACETAMINOPHEN 325 MG PO TABS: 650.0000 mg | ORAL_TABLET | Freq: Four times a day (QID) | ORAL | Status: DC | PRN

## 2012-07-04 MED ORDER — SODIUM CHLORIDE 0.9 % IV SOLN: INTRAVENOUS | Status: DC

## 2012-07-04 MED ORDER — PANTOPRAZOLE SODIUM 40 MG IV SOLR
40.0000 mg | Freq: Once | INTRAVENOUS | Status: AC
  Administered 2012-07-04: 40 mg via INTRAVENOUS
  Filled 2012-07-04: qty 40

## 2012-07-04 MED ORDER — ACETAMINOPHEN 650 MG RE SUPP: 650.0000 mg | Freq: Four times a day (QID) | RECTAL | Status: DC | PRN

## 2012-07-04 MED ORDER — ALUM & MAG HYDROXIDE-SIMETH 200-200-20 MG/5ML PO SUSP: 30.0000 mL | Freq: Four times a day (QID) | ORAL | Status: DC | PRN

## 2012-07-04 MED ORDER — ALBUTEROL SULFATE (5 MG/ML) 0.5% IN NEBU: 2.5000 mg | INHALATION_SOLUTION | RESPIRATORY_TRACT | Status: DC | PRN

## 2012-07-04 MED ORDER — ONDANSETRON HCL 4 MG/2ML IJ SOLN: 4.0000 mg | Freq: Three times a day (TID) | INTRAMUSCULAR | Status: AC | PRN

## 2012-07-04 MED ORDER — PEG 3350-KCL-NA BICARB-NACL 420 G PO SOLR
4000.0000 mL | Freq: Once | ORAL | Status: AC
  Administered 2012-07-04: 4000 mL via ORAL
  Filled 2012-07-04 (×2): qty 4000

## 2012-07-04 MED ORDER — SODIUM CHLORIDE 0.9 % IV SOLN
INTRAVENOUS | Status: DC
  Administered 2012-07-04: 06:00:00 via INTRAVENOUS

## 2012-07-04 MED ORDER — SODIUM CHLORIDE 0.9 % IV SOLN
INTRAVENOUS | Status: DC
  Administered 2012-07-04 – 2012-07-05 (×2): via INTRAVENOUS

## 2012-07-04 MED ORDER — POTASSIUM CHLORIDE CRYS ER 20 MEQ PO TBCR
40.0000 meq | EXTENDED_RELEASE_TABLET | Freq: Once | ORAL | Status: DC
  Filled 2012-07-04: qty 2

## 2012-07-04 MED ORDER — ONDANSETRON HCL 4 MG PO TABS: 4.0000 mg | ORAL_TABLET | Freq: Four times a day (QID) | ORAL | Status: DC | PRN

## 2012-07-04 MED ORDER — ONDANSETRON HCL 4 MG/2ML IJ SOLN: 4.0000 mg | Freq: Four times a day (QID) | INTRAMUSCULAR | Status: DC | PRN

## 2012-07-04 MED ORDER — POTASSIUM CHLORIDE 10 MEQ/100ML IV SOLN
10.0000 meq | INTRAVENOUS | Status: AC
  Administered 2012-07-04 (×3): 10 meq via INTRAVENOUS
  Filled 2012-07-04 (×3): qty 100

## 2012-07-04 MED ORDER — POTASSIUM CHLORIDE 10 MEQ/100ML IV SOLN
10.0000 meq | INTRAVENOUS | Status: AC
  Administered 2012-07-04 (×2): 10 meq via INTRAVENOUS
  Filled 2012-07-04 (×2): qty 100

## 2012-07-04 MED ORDER — PANTOPRAZOLE SODIUM 40 MG IV SOLR
40.0000 mg | Freq: Two times a day (BID) | INTRAVENOUS | Status: DC
  Administered 2012-07-04 – 2012-07-05 (×4): 40 mg via INTRAVENOUS
  Filled 2012-07-04 (×6): qty 40

## 2012-07-04 MED ORDER — SODIUM CHLORIDE 0.9 % IJ SOLN
3.0000 mL | Freq: Two times a day (BID) | INTRAMUSCULAR | Status: DC
  Administered 2012-07-04 – 2012-07-05 (×4): 3 mL via INTRAVENOUS

## 2012-07-04 MED ORDER — LEVOTHYROXINE SODIUM 125 MCG PO TABS
125.0000 ug | ORAL_TABLET | Freq: Every day | ORAL | Status: DC
  Administered 2012-07-04 – 2012-07-06 (×3): 125 ug via ORAL
  Filled 2012-07-04 (×3): qty 1

## 2012-07-04 MED ORDER — SODIUM CHLORIDE 0.9 % IJ SOLN
INTRAMUSCULAR | Status: AC
  Filled 2012-07-04: qty 10

## 2012-07-04 NOTE — Progress Notes (Signed)
[facility] Progress Note [place] TEAM 1 - Stepdown/ICU TEAM   Angelica [name] WLN:[id number] DOB: [birth year] DOA: 07/03/2012 PCP: Mayra Neer, MD  Brief narrative: 71 year old female patient with multiple medical problems including total thyroidectomy in 2012 for thyroid cancer as well as COPD on nocturnal oxygen. Patient had been experiencing fatigue that was felt to be a cardiac ischemic equivalent and she was in the process of her preoperative evaluation for planned cardiac catheterization when she was found to have a hemoglobin of 7.5. She was sent to the ER for evaluation and was also found to have Hemoccult positive stools although denies frank red blood her stools. Because of concerns for GI bleeding patient was admitted to the step down unit for further monitoring and treatment  Assessment/Plan: Principal Problem:  *Chronic - Iron deficiency anemia (symptomatic)/Hemoccult positive *She is status post 2 units of packed red blood cells since admission and follow-up hemoglobin is up to 9.3 *Today patient endorses that she had previously been placed on iron supplementation by her primary care physician in the past year but because her blood counts have improved she stopped the iron tablets *Prior to admission has not had any frank red bleeding and has had none since admission *Gastroenterology/Dr. Oletta Lamas has evaluated the patient and is planning on proceeding with EGD and colonoscopy on 07/05/2012 *Follow CBC every 12 hours  Active Problems:  GERD *Continue PPI *Patient endorses issues with difficulty swallowing and occasional postprandial emesis   Hypokalemia *Oral and IV repletion *Follow electrolytes   Hypertension *Blood pressure well controlled *Home Norvasc on hold   Fatigue *Felt to be an ischemic equivalent and an outpatient cardiac catheterization is pending. Dr. Marlou Porch with cardiology has made a courtesy visit with the patient today and is recommending  deferring catheterization until anemia issue resolved and patient can followup with him as an outpatient regarding timing of catheterization *Fatigue could also be warning sign of her anemia   History of thyroid cancer- s/p total thyroidectomy 2012 *TSH was obtained at admission and as would be expected is very low at 0.044 *Continue home Synthroid   COPD (chronic obstructive pulmonary disease)- on nocturnal oxygen at home *Compensated without wheezing or dyspnea   DVT prophylaxis: SCDs since admitted with heme positive stool Code Status: DO NOT RESUSCITATE Family Communication: Spoke directly to patient Disposition Plan: Transfer to floor  Consultants: Gastroenterology  Procedures: None  Antibiotics: None  HPI/Subjective: Currently denying chest pain or shortness of breath. No black or bloody stools. Complaining of being hungry.   Objective: Blood pressure 136/53, pulse 70, temperature 98.7 F (37.1 C), temperature source Oral, resp. rate 19, height 5' 2"  (1.575 m), weight 96 kg (211 lb 10.3 oz), SpO2 94.00%.  Intake/Output Summary (Last 24 hours) at 07/04/12 1431 Last data filed at 07/04/12 1200  Gross per 24 hour  Intake   1076 ml  Output    750 ml  Net    326 ml     Exam: General: No acute respiratory distress Lungs: Clear to auscultation bilaterally without wheezes or crackles Cardiovascular: Regular rate and rhythm without murmur gallop or rub normal S1 and S2 Abdomen: Nontender, nondistended, soft, bowel sounds positive, no rebound, no ascites, no appreciable mass Musculoskeletal: No significant cyanosis, clubbing of bilateral lower extremities Neurological: Patient is alert and oriented x3, moves all extremities x4 and ambulates easily in the room, exam nonfocal  Data Reviewed: Basic Metabolic Panel:  Lab 74/08/14 0455 07/03/12 1836  NA 141 137  K 3.3* 3.3*  CL 101 99  CO2 30 28  GLUCOSE 99 101*  BUN 9 11  CREATININE 0.71 0.68  CALCIUM 9.1 9.2  MG  1.9 --  PHOS 4.7* --   Liver Function Tests:  Lab 07/04/12 0455 07/03/12 1836  AST 13 17  ALT 10 11  ALKPHOS 70 77  BILITOT 1.3* 0.6  PROT 6.6 7.3  ALBUMIN 3.5 3.7   No results found for this basename: LIPASE:5,AMYLASE:5 in the last 168 hours No results found for this basename: AMMONIA:5 in the last 168 hours CBC:  Lab 07/04/12 1005 07/04/12 0455 07/03/12 1836  WBC 8.2 8.7 8.6  NEUTROABS -- -- --  HGB 9.3* 9.0* 7.5*  HCT 32.3* 30.9* 28.0*  MCV 68.1* 68.4* 65.1*  PLT 356 367 427*   Cardiac Enzymes:  Lab 07/03/12 2123  CKTOTAL --  CKMB --  CKMBINDEX --  TROPONINI <0.30   BNP (last 3 results)  Basename 07/03/12 2123  PROBNP 73.7   CBG: No results found for this basename: GLUCAP:5 in the last 168 hours  Recent Results (from the past 240 hour(s))  MRSA PCR SCREENING     Status: Normal   Collection Time   07/04/12  1:53 AM      Component Value Range Status Comment   MRSA by PCR NEGATIVE  NEGATIVE Final      Studies:  Recent x-ray studies have been reviewed in detail by the Attending Physician  Scheduled Meds:  Reviewed in detail by the Attending Physician   Erin Hearing, ANP [facility] Office  [phone] Pager [phone]  On-Call/Text Page:      [email or web address]      password [email or web address]  If 7PM-7AM, please contact night-coverage [email or web address] Password [email or web address] 07/04/2012, 2:31 PM   LOS: 1 day   I have examined the patient, reviewed the chart and agree with the above note which I have modified.   Debbe Odea, MD [phone]

## 2012-07-04 NOTE — Progress Notes (Signed)
Courtesy visit  Appreciate hospitalist team care  Transfusion  GI consult.   Cath can wait until anemia issue resolved.   Will eval as outpatient for timing on cath.  Dr. Brigitte Pulse (PCP) notified.

## 2012-07-04 NOTE — Progress Notes (Signed)
07/04/2012 5:07 PM Barbara Cower  [id number]  Report called to Liborio Nixon, RN on 5500. VSS. Transferred with belongings to new room 5531.  Celesta Gentile 10/10/20135:07 PM

## 2012-07-04 NOTE — Progress Notes (Signed)
Utilization review completed.  

## 2012-07-04 NOTE — Progress Notes (Signed)
Angelica [name] is a 71 y.o. female patient who transferred  from 3311 awake, alert  & orientated  X 3, DNR, VSS - Blood pressure 188/89, pulse 79, temperature 98.2 F (36.8 C), temperature source Oral, resp. rate 20, height 5\' 2"  (1.575 m), weight 96 kg (211 lb 10.3 oz), SpO2 99.00%., R/A, no c/o shortness of breath, no c/o chest pain, no distress noted.   IV site WDL: antecubital left NSL & right forearm, condition patent and no redness with a transparent dsg that's clean dry and intact.  Allergies:   Allergies  Allergen Reactions  . Fosamax (Alendronate Sodium)   . Other     Pt states she has several other allergies but can't recall the names   . Penicillins   . Sulfa Antibiotics      Past Medical History  Diagnosis Date  . Hypertension   . Arthritis   . Asthma   . Cancer     thyroid  . COPD (chronic obstructive pulmonary disease)   . Thyroid disease     Pt orientation to unit, room and routine. SR up x 2, fall risk assessment complete with Patient and family verbalizing understanding of risks associated with falls. Pt verbalizes an understanding of how to use the call bell and to call for help before getting out of bed.  Skin, clean-dry- intact without evidence of bruising, or skin tears.   No evidence of skin break down noted on exam. no rashes    Will cont to monitor and assist as needed.  Joana Reamer, RN 07/04/2012 6:21 PM

## 2012-07-04 NOTE — Consult Note (Signed)
[facility] CONSULT Reason for consult: Anemia and heme positive stool Referring Physician: [facility]. Cardiologist: Dr. Anne Fu. PCP: Dr. Jarrett Ables is an 71 y.o. female.  HPI:  A very nice woman who has had a fairly long history of anemia. She apparently was found to be anemic and started on iron this year and her ports that her blood count came back to normal and she stopped taking the iron. She saw Dr. Anne Fu for evaluation for fatigue and a question of heart disease. He felt she probably did have some prior cardiac disease. There was a question of an old infarct on EKG although the patient has no knowledge of previous infarct. Routine lab work revealed hemoglobin 7.2 with normal PT. Patient does have COPD and was admitted for transfusion. She was found to have positive stool Hemoccults. She does have a history of heartburn and takes Zantac twice a day which completely resolves her symptoms. She's never had prior EGD. She had a colonoscopy for screening purposes 4/09 by Dr. Laural Benes had diverticulosis and a small hyperplastic polyp. No family history of colon cancer or upper GI cancer. She adamantly denies any dysphagia, hematochezia, melena. She denies the use of any NSAIDs.  Past Medical History  Diagnosis Date  . Hypertension   . Arthritis   . Asthma   . Cancer     thyroid  . COPD (chronic obstructive pulmonary disease)   . Thyroid disease     Past Surgical History  Procedure Date  . Abdominal hysterectomy   . Tonsillectomy   . Cholecystectomy     Family History  Problem Relation Age of Onset  . Heart disease Mother   . Kidney failure Father     Social History:  reports that she quit smoking about 4 years ago. She has never used smokeless tobacco. She reports that she does not drink alcohol or use illicit drugs.  Allergies:  Allergies  Allergen Reactions  . Fosamax (Alendronate Sodium)   . Other     Pt states she has several other allergies but  can't recall the names   . Penicillins   . Sulfa Antibiotics     Medications;    . sodium chloride   Intravenous STAT  . docusate sodium  100 mg Oral BID  . levothyroxine  125 mcg Oral Daily  . pantoprazole (PROTONIX) IV  40 mg Intravenous Once  . pantoprazole (PROTONIX) IV  40 mg Intravenous Once  . pantoprazole (PROTONIX) IV  40 mg Intravenous Q12H  . potassium chloride  10 mEq Intravenous Q1 Hr x 2  . sodium chloride  3 mL Intravenous Q12H  . sodium chloride      . DISCONTD: sodium chloride   Intravenous STAT   PRN Meds acetaminophen, acetaminophen, albuterol, alum & mag hydroxide-simeth, guaiFENesin-dextromethorphan, HYDROcodone-acetaminophen, ondansetron (ZOFRAN) IV, ondansetron (ZOFRAN) IV, ondansetron Results for orders placed during the hospital encounter of 07/03/12 (from the past 48 hour(s))  CBC     Status: Abnormal   Collection Time   07/03/12  6:36 PM      Component Value Range Comment   WBC 8.6  4.0 - 10.5 K/uL    RBC 4.30  3.87 - 5.11 MIL/uL    Hemoglobin 7.5 (*) 12.0 - 15.0 g/dL    HCT 16.1 (*) 09.6 - 46.0 %    MCV 65.1 (*) 78.0 - 100.0 fL    MCH 17.4 (*) 26.0 - 34.0 pg    MCHC 26.8 (*) 30.0 - 36.0  g/dL    RDW 16.1 (*) 09.6 - 15.5 %    Platelets 427 (*) 150 - 400 K/uL   COMPREHENSIVE METABOLIC PANEL     Status: Abnormal   Collection Time   07/03/12  6:36 PM      Component Value Range Comment   Sodium 137  135 - 145 mEq/L    Potassium 3.3 (*) 3.5 - 5.1 mEq/L    Chloride 99  96 - 112 mEq/L    CO2 28  19 - 32 mEq/L    Glucose, Bld 101 (*) 70 - 99 mg/dL    BUN 11  6 - 23 mg/dL    Creatinine, Ser 0.45  0.50 - 1.10 mg/dL    Calcium 9.2  8.4 - 40.9 mg/dL    Total Protein 7.3  6.0 - 8.3 g/dL    Albumin 3.7  3.5 - 5.2 g/dL    AST 17  0 - 37 U/L    ALT 11  0 - 35 U/L    Alkaline Phosphatase 77  39 - 117 U/L    Total Bilirubin 0.6  0.3 - 1.2 mg/dL    GFR calc non Af Amer 85 (*) >90 mL/min    GFR calc Af Amer >90  >90 mL/min   TYPE AND SCREEN     Status:  Normal (Preliminary result)   Collection Time   07/03/12  6:37 PM      Component Value Range Comment   ABO/RH(D) A POS      Antibody Screen NEG      Sample Expiration 07/06/2012      Unit Number [id number]      Blood Component Type RED CELLS,LR      Unit division 00      Status of Unit ISSUED,FINAL      Transfusion Status OK TO TRANSFUSE      Crossmatch Result Compatible      Unit Number [id number]      Blood Component Type RED CELLS,LR      Unit division 00      Status of Unit ISSUED      Transfusion Status OK TO TRANSFUSE      Crossmatch Result Compatible     ABO/RH     Status: Normal   Collection Time   07/03/12  6:37 PM      Component Value Range Comment   ABO/RH(D) A POS     TROPONIN I     Status: Normal   Collection Time   07/03/12  9:23 PM      Component Value Range Comment   Troponin I <0.30  <0.30 ng/mL   PRO B NATRIURETIC PEPTIDE     Status: Normal   Collection Time   07/03/12  9:23 PM      Component Value Range Comment   Pro B Natriuretic peptide (BNP) 73.7  0 - 125 pg/mL   OCCULT BLOOD, POC DEVICE     Status: Normal   Collection Time   07/03/12  9:36 PM      Component Value Range Comment   Fecal Occult Bld POSITIVE     PREPARE RBC (CROSSMATCH)     Status: Normal   Collection Time   07/03/12 11:00 PM      Component Value Range Comment   Order Confirmation ORDER PROCESSED BY BLOOD BANK     MRSA PCR SCREENING     Status: Normal   Collection Time   07/04/12  1:53 AM  Component Value Range Comment   MRSA by PCR NEGATIVE  NEGATIVE   MAGNESIUM     Status: Normal   Collection Time   07/04/12  4:55 AM      Component Value Range Comment   Magnesium 1.9  1.5 - 2.5 mg/dL   PHOSPHORUS     Status: Abnormal   Collection Time   07/04/12  4:55 AM      Component Value Range Comment   Phosphorus 4.7 (*) 2.3 - 4.6 mg/dL   TSH     Status: Abnormal   Collection Time   07/04/12  4:55 AM      Component Value Range Comment   TSH 0.044 (*) 0.350 - 4.500 uIU/mL     COMPREHENSIVE METABOLIC PANEL     Status: Abnormal   Collection Time   07/04/12  4:55 AM      Component Value Range Comment   Sodium 141  135 - 145 mEq/L    Potassium 3.3 (*) 3.5 - 5.1 mEq/L    Chloride 101  96 - 112 mEq/L    CO2 30  19 - 32 mEq/L    Glucose, Bld 99  70 - 99 mg/dL    BUN 9  6 - 23 mg/dL    Creatinine, Ser 1.61  0.50 - 1.10 mg/dL    Calcium 9.1  8.4 - 09.6 mg/dL    Total Protein 6.6  6.0 - 8.3 g/dL    Albumin 3.5  3.5 - 5.2 g/dL    AST 13  0 - 37 U/L    ALT 10  0 - 35 U/L    Alkaline Phosphatase 70  39 - 117 U/L    Total Bilirubin 1.3 (*) 0.3 - 1.2 mg/dL    GFR calc non Af Amer 84 (*) >90 mL/min    GFR calc Af Amer >90  >90 mL/min   CBC     Status: Abnormal   Collection Time   07/04/12  4:55 AM      Component Value Range Comment   WBC 8.7  4.0 - 10.5 K/uL    RBC 4.52  3.87 - 5.11 MIL/uL    Hemoglobin 9.0 (*) 12.0 - 15.0 g/dL    HCT 04.5 (*) 40.9 - 46.0 %    MCV 68.4 (*) 78.0 - 100.0 fL    MCH 19.9 (*) 26.0 - 34.0 pg    MCHC 29.1 (*) 30.0 - 36.0 g/dL    RDW 81.1 (*) 91.4 - 15.5 %    Platelets 367  150 - 400 K/uL   CBC     Status: Abnormal   Collection Time   07/04/12 10:05 AM      Component Value Range Comment   WBC 8.2  4.0 - 10.5 K/uL    RBC 4.74  3.87 - 5.11 MIL/uL    Hemoglobin 9.3 (*) 12.0 - 15.0 g/dL    HCT 78.2 (*) 95.6 - 46.0 %    MCV 68.1 (*) 78.0 - 100.0 fL    MCH 19.6 (*) 26.0 - 34.0 pg    MCHC 28.8 (*) 30.0 - 36.0 g/dL    RDW 21.3 (*) 08.6 - 15.5 %    Platelets 356  150 - 400 K/uL     No results found. ROS: Constitutional: Marked weakness as noted above HEENT: Negative Cardiovascular: Denies chest pain cardiac workup noted above Respiratory: Prior smoker quit 4 years ago on home oxygen at night GI: As above GU: Musculoskeletal: Neuro/Psychiatric: Endocrine/Heme:  Blood pressure 136/53, pulse 70, temperature 98.7 F (37.1 C), temperature source Oral, resp. rate 19, height 5\' 2"  (1.575 m), weight 96 kg (211 lb  10.3 oz), SpO2 94.00%.  Physical exam:   Gen.-somewhat obese white female in a clear liquid lunch very talkative in no distress Lungs-clear Heart-regular in rhythm without murmurs or gallops Abdomen-obese but soft and nontender  Assessment: 1. Heme positive stools and anemia. Patient had colonoscopy 4 years ago. In view of the degree of her anemia and positive stools I think that should be repeated and we will plan on EGD at the same time.  Plan: We have discussed EGD and colonoscopy with the patient and her sister we'll plan on going ahead with this 11:30 tomorrow.   Tung Pustejovsky JR,Labrina Lines L 07/04/2012, 1:08 PM

## 2012-07-05 ENCOUNTER — Encounter (HOSPITAL_COMMUNITY)
Admission: EM | Disposition: A | Discharge status: Home / Self Care | Payer: Self-pay | Source: Home / Self Care | Attending: Internal Medicine

## 2012-07-05 ENCOUNTER — Inpatient Hospital Stay (HOSPITAL_BASED_OUTPATIENT_CLINIC_OR_DEPARTMENT_OTHER): Admission: RE | Admit: 2012-07-05 | Payer: Medicare Other | Source: Ambulatory Visit | Admitting: Cardiology

## 2012-07-05 ENCOUNTER — Encounter (HOSPITAL_COMMUNITY): Payer: Self-pay | Admitting: *Deleted

## 2012-07-05 ENCOUNTER — Encounter (HOSPITAL_BASED_OUTPATIENT_CLINIC_OR_DEPARTMENT_OTHER): Admission: RE | Payer: Self-pay | Source: Ambulatory Visit

## 2012-07-05 DIAGNOSIS — K921 Melena: Secondary | ICD-10-CM | POA: Diagnosis not present

## 2012-07-05 DIAGNOSIS — D649 Anemia, unspecified: Secondary | ICD-10-CM | POA: Diagnosis not present

## 2012-07-05 DIAGNOSIS — J449 Chronic obstructive pulmonary disease, unspecified: Secondary | ICD-10-CM | POA: Diagnosis not present

## 2012-07-05 DIAGNOSIS — E876 Hypokalemia: Secondary | ICD-10-CM | POA: Diagnosis not present

## 2012-07-05 DIAGNOSIS — D509 Iron deficiency anemia, unspecified: Secondary | ICD-10-CM | POA: Diagnosis not present

## 2012-07-05 DIAGNOSIS — K5730 Diverticulosis of large intestine without perforation or abscess without bleeding: Secondary | ICD-10-CM | POA: Diagnosis not present

## 2012-07-05 DIAGNOSIS — K449 Diaphragmatic hernia without obstruction or gangrene: Secondary | ICD-10-CM | POA: Diagnosis not present

## 2012-07-05 HISTORY — PX: ESOPHAGOGASTRODUODENOSCOPY: SHX5428

## 2012-07-05 HISTORY — PX: COLONOSCOPY: SHX5424

## 2012-07-05 LAB — CBC
MCV: 69.6 fL — ABNORMAL LOW (ref 78.0–100.0)
Platelets: 357 10*3/uL (ref 150–400)
RBC: 4.73 MIL/uL (ref 3.87–5.11)
RDW: 20.7 % — ABNORMAL HIGH (ref 11.5–15.5)
WBC: 7.8 10*3/uL (ref 4.0–10.5)

## 2012-07-05 LAB — TYPE AND SCREEN
ABO/RH(D): A POS
Unit division: 0

## 2012-07-05 LAB — BASIC METABOLIC PANEL
Calcium: 9.1 mg/dL (ref 8.4–10.5)
Creatinine, Ser: 0.69 mg/dL (ref 0.50–1.10)
GFR calc Af Amer: >90 mL/min (ref >90)
GFR calc non Af Amer: 84 mL/min — ABNORMAL LOW (ref >90)
Sodium: 142 mEq/L (ref 135–145)

## 2012-07-05 SURGERY — JV LEFT AND RIGHT HEART CATHETERIZATION WITH CORONARY ANGIOGRAM
Anesthesia: Moderate Sedation

## 2012-07-05 SURGERY — EGD (ESOPHAGOGASTRODUODENOSCOPY)
Anesthesia: Moderate Sedation

## 2012-07-05 MED ORDER — FENTANYL CITRATE 0.05 MG/ML IJ SOLN
INTRAMUSCULAR | Status: DC | PRN
  Administered 2012-07-05 (×3): 25 ug via INTRAVENOUS

## 2012-07-05 MED ORDER — FERROUS SULFATE 300 (60 FE) MG/5ML PO SYRP
300.0000 mg | ORAL_SOLUTION | Freq: Three times a day (TID) | ORAL | Status: DC
  Administered 2012-07-06 (×2): 300 mg via ORAL
  Filled 2012-07-05 (×6): qty 5

## 2012-07-05 MED ORDER — MIDAZOLAM HCL 5 MG/5ML IJ SOLN
INTRAMUSCULAR | Status: DC | PRN
  Administered 2012-07-05 (×4): 2 mg via INTRAVENOUS

## 2012-07-05 MED ORDER — MIDAZOLAM HCL 5 MG/ML IJ SOLN
INTRAMUSCULAR | Status: AC
  Filled 2012-07-05: qty 2

## 2012-07-05 MED ORDER — DIPHENHYDRAMINE HCL 50 MG/ML IJ SOLN
INTRAMUSCULAR | Status: AC
  Filled 2012-07-05: qty 1

## 2012-07-05 MED ORDER — FENTANYL CITRATE 0.05 MG/ML IJ SOLN
INTRAMUSCULAR | Status: AC
  Filled 2012-07-05: qty 4

## 2012-07-05 MED ORDER — BUTAMBEN-TETRACAINE-BENZOCAINE 2-2-14 % EX AERO
INHALATION_SPRAY | CUTANEOUS | Status: DC | PRN
  Administered 2012-07-05: 1 via TOPICAL

## 2012-07-05 NOTE — Op Note (Signed)
[facility] [address] [place], [address]   COLONOSCOPY PROCEDURE REPORT  PATIENT: Angelica [name], Angelica [name]  MR#: [id number] BIRTHDATE: [birth year] , 71  yrs. old GENDER: Female ENDOSCOPIST: Carman Ching, MD REFERRED BY:   [facility] PCP: Dr. Clelia Croft PROCEDURE DATE:  07/05/2012 PROCEDURE:   Colonoscopy ASA CLASS:   class II INDICATIONS:  iron deficiency anemia. Positive stool. EGD just before this procedure revealed massive hiatal hernia MEDICATIONS:   fentanyl 100 mcg Versed 8 mg IV  DESCRIPTION OF PROCEDURE: Procedure explained to the patient and consent obtained. Following the EGD a digital rectal exam was performed and the Pentax pediatric colonoscope was inserted due to her known history of diverticular disease. The prep was excellent we able to reach the cecum and the appendiceal orifice identified as well as the ileocecal valve. No AVMs were seen. The scope was withdrawn and the colon was free of tumors, masses, polyps or other gross lesions other than diverticulosis of moderate nature in the sigmoid and descending colon. Some hemorrhoids were seen in the rectum on retroflex view.     COMPLICATIONS: None  ENDOSCOPIC IMPRESSION: 1.Diverticulosis of the Left Colon 2. Hemorrhoids 3. Iron deficiency anemia/heme positive stools  RECOMMENDATIONS: 1. We'll go ahead and continue current therapy. Patient will need followup to make certain that her hemoglobin has improved.    _______________________________ Rosalie DoctorCarman Ching, MD 07/05/2012 12:51 PM   CC: Dr. Clelia Croft

## 2012-07-05 NOTE — Progress Notes (Signed)
[facility] PROGRESS NOTE Interim History: 71 year old female patient with multiple medical problems including total thyroidectomy in 2012 for thyroid cancer as well as COPD on nocturnal oxygen. Patient had been experiencing fatigue that was felt to be a cardiac ischemic equivalent and she was in the process of her preoperative evaluation for planned cardiac catheterization when she was found to have a hemoglobin of 7.5. She was sent to the ER for evaluation and was also found to have Hemoccult positive stools although denies frank red blood her stools. Because of concerns for GI bleeding patient was admitted to the step down unit for further monitoring and treatment  Assessment/Plan: Chronic anemia- symptomatic/Iron deficiency anemia (07/04/2012) (07/03/2012) -She is status post 2 units of packed red blood cells since admission and follow-up hemoglobin is up to 9.3. -she stopped the iron tablets at home. -Gastroenterology/Dr. Oletta Lamas has evaluated the patient and is planning on proceeding with EGD and colonoscopy on 07/05/2012. -Fecal occult positive. -Follow CBC every 12 hours.  Hypokalemia (07/03/2012) -Oral and IV repletion. -Follow electrolytes.  Hypertension (07/03/2012) -Blood pressure well controlled. -Home Norvasc on hold.  History of thyroid cancer- s/p total thyroidectomy 2012 (07/04/2012) -TSH was obtained at admission and as would be expected is very low at 0.044  -Continue home Synthroid  COPD (chronic obstructive pulmonary disease)- on nocturnal oxygen at home (07/04/2012) -Compensated without wheezing or dyspnea    Code Status: full Family Communication:  Disposition Plan: home   Consultants:  Dr. Oletta Lamas  Procedures:  Colonoscopy  Antibiotics:  none (indicate start date, and stop date if known)  HPI/Subjective: No complains  Objective: Filed Vitals:   07/04/12 1826 07/04/12 2220 07/05/12 0620 07/05/12 1103  BP:  133/93 109/62 163/99  Pulse:   77 62   Temp:  97.4 F (36.3 C) 98.3 F (36.8 C) 98 F (36.7 C)  TempSrc:  Oral Oral Oral  Resp:  20 20 21   Height: 5' 3"  (1.6 m)     Weight: 95.9 kg (211 lb 6.7 oz)     SpO2:  97% 96% 96%    Intake/Output Summary (Last 24 hours) at 07/05/12 1144 Last data filed at 07/05/12 0629  Gross per 24 hour  Intake 439.83 ml  Output    450 ml  Net -10.17 ml   Filed Weights   07/04/12 0211 07/04/12 1826  Weight: 96 kg (211 lb 10.3 oz) 95.9 kg (211 lb 6.7 oz)    Exam:  General: Alert, awake, oriented x3, in no acute distress.  HEENT: No bruits, no goiter.  Heart: Regular rate and rhythm, without murmurs, rubs, gallops.  Lungs: Good air movement, bilateral air movement.  Abdomen: Soft, nontender, nondistended, positive bowel sounds.  Neuro: Grossly intact, nonfocal.   Data Reviewed: Basic Metabolic Panel:  Lab 02/54/27 0620 07/04/12 0455 07/03/12 1836  NA 142 141 137  K 3.7 3.3* 3.3*  CL 104 101 99  CO2 27 30 28   GLUCOSE 104* 99 101*  BUN 7 9 11   CREATININE 0.69 0.71 0.68  CALCIUM 9.1 9.1 9.2  MG -- 1.9 --  PHOS -- 4.7* --   Liver Function Tests:  Lab 07/04/12 0455 07/03/12 1836  AST 13 17  ALT 10 11  ALKPHOS 70 77  BILITOT 1.3* 0.6  PROT 6.6 7.3  ALBUMIN 3.5 3.7   No results found for this basename: LIPASE:5,AMYLASE:5 in the last 168 hours No results found for this basename: AMMONIA:5 in the last 168 hours CBC:  Lab 07/04/12 2326 07/04/12 1005 07/04/12  0455 07/03/12 1836  WBC 8.4 8.2 8.7 8.6  NEUTROABS -- -- -- --  HGB 10.5* 9.3* 9.0* 7.5*  HCT 35.7* 32.3* 30.9* 28.0*  MCV 68.5* 68.1* 68.4* 65.1*  PLT 354 356 367 427*   Cardiac Enzymes:  Lab 07/03/12 2123  CKTOTAL --  CKMB --  CKMBINDEX --  TROPONINI <0.30   BNP (last 3 results)  Basename 07/03/12 2123  PROBNP 73.7   CBG: No results found for this basename: GLUCAP:5 in the last 168 hours  Recent Results (from the past 240 hour(s))  MRSA PCR SCREENING     Status: Normal   Collection Time    07/04/12  1:53 AM      Component Value Range Status Comment   MRSA by PCR NEGATIVE  NEGATIVE Final      Studies: No results found.  Scheduled Meds:   . docusate sodium  100 mg Oral BID  . levothyroxine  125 mcg Oral Daily  . pantoprazole (PROTONIX) IV  40 mg Intravenous Q12H  . polyethylene glycol-electrolytes  4,000 mL Oral Once  . potassium chloride  10 mEq Intravenous Q1 Hr x 4  . potassium chloride      . sodium chloride  3 mL Intravenous Q12H  . sodium chloride      . DISCONTD: sodium chloride   Intravenous STAT  . DISCONTD: potassium chloride  40 mEq Oral Once   Continuous Infusions:   . sodium chloride    . sodium chloride 20 mL/hr at 07/05/12 [place], [place]  [facility] Pager [phone]. If 8PM-8AM, please contact night-coverage at [email or web address], password [facility] 07/05/2012, 11:44 AM  LOS: 2 days

## 2012-07-05 NOTE — Op Note (Signed)
[facility] [address] [place], [address]   ENDOSCOPY PROCEDURE REPORT  PATIENT: [name], Angelica  MR#: [id number] BIRTHDATE: 1941/02/24 , 71  yrs. old GENDER: Female ENDOSCOPIST:Natane Heward Randa Evens, MD REFERRED BY:  [facility]. PCP: Dr. Cam Hai. Cardiologist: Dr. Anne Fu PROCEDURE DATE:  07/05/2012 PROCEDURE:   EGD and Biopsy ASA CLASS:   class II INDICATIONS:  woman with previous history of diverticulosis and hyperplastic polyps on colonoscopy presented with marked anemia. Apparently has had history of previous iron deficiency anemia in the past. MEDICATION:    fentanyl 75 mcg Versed 6 mg IV TOPICAL ANESTHETIC:    Cetacaine spray  DESCRIPTION OF PROCEDURE: Procedure explained to the patient consent obtained. In the left lateral decubitus position Pentax upper scope inserted blindly into the esophagus and advanced under direct visualization. The esophagus was normal. 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 The scope did pass with gentle pressure. There was a small amount of heme. She had a very large hiatal hernia with no obvious inflammation or Cameron ulcers. There was no active bleeding. Diaphragm located at 40 cm and GE junction at 32 cm. She does have an 8 cm hiatal hernia. Stomach was carefully examined in the forward and retroflex view and was found to be normal. The port channel was normal and was passed. The duodenal bulb and second duodenum were endoscopically normal. Several biopsies were taken to evaluate for celiac disease. The scope was withdrawn and the initial findings were confirmed.     COMPLICATIONS: None  ENDOSCOPIC IMPRESSION: 1. Iron deficiency anemia probably secondary to massive hiatal hernia. Celiac biopsies obtained 2. Massive hiatal hernia probably due to esophageal reflux.  RECOMMENDATIONS: 1. Will proceed with colonoscopy at this  time. 2. We'll go ahead and maximize her antireflux therapy.We'll Begin PPI therapy. 3. We will consider dilation of stricture in the future if patient develops clinical dysphagia.    _______________________________ Rosalie DoctorCarman Ching, MD 07/05/2012 12:45 PM CC: Dr. Clelia Croft    PATIENT NAME:  [name], Angelica MR#: [id number]

## 2012-07-05 NOTE — Plan of Care (Signed)
Problem: Phase I Progression Outcomes Goal: Initial discharge plan identified Outcome: Completed/Met Date Met:  07/05/12 To return home

## 2012-07-05 NOTE — H&P (View-Only) (Signed)
[facility] Progress Note [place] TEAM 1 - Stepdown/ICU TEAM   Angelica [name] YIA:[id number] DOB: Jun 02, 1941 DOA: 07/03/2012 PCP: Mayra Neer, MD  Brief narrative: 71 year old female patient with multiple medical problems including total thyroidectomy in 2012 for thyroid cancer as well as COPD on nocturnal oxygen. Patient had been experiencing fatigue that was felt to be a cardiac ischemic equivalent and she was in the process of her preoperative evaluation for planned cardiac catheterization when she was found to have a hemoglobin of 7.5. She was sent to the ER for evaluation and was also found to have Hemoccult positive stools although denies frank red blood her stools. Because of concerns for GI bleeding patient was admitted to the step down unit for further monitoring and treatment  Assessment/Plan: Principal Problem:  *Chronic - Iron deficiency anemia (symptomatic)/Hemoccult positive *She is status post 2 units of packed red blood cells since admission and follow-up hemoglobin is up to 9.3 *Today patient endorses that she had previously been placed on iron supplementation by her primary care physician in the past year but because her blood counts have improved she stopped the iron tablets *Prior to admission has not had any frank red bleeding and has had none since admission *Gastroenterology/Dr. Oletta Lamas has evaluated the patient and is planning on proceeding with EGD and colonoscopy on 07/05/2012 *Follow CBC every 12 hours  Active Problems:  GERD *Continue PPI *Patient endorses issues with difficulty swallowing and occasional postprandial emesis   Hypokalemia *Oral and IV repletion *Follow electrolytes   Hypertension *Blood pressure well controlled *Home Norvasc on hold   Fatigue *Felt to be an ischemic equivalent and an outpatient cardiac catheterization is pending. Dr. Marlou Porch with cardiology has made a courtesy visit with the patient today and is recommending  deferring catheterization until anemia issue resolved and patient can followup with him as an outpatient regarding timing of catheterization *Fatigue could also be warning sign of her anemia   History of thyroid cancer- s/p total thyroidectomy 2012 *TSH was obtained at admission and as would be expected is very low at 0.044 *Continue home Synthroid   COPD (chronic obstructive pulmonary disease)- on nocturnal oxygen at home *Compensated without wheezing or dyspnea   DVT prophylaxis: SCDs since admitted with heme positive stool Code Status: DO NOT RESUSCITATE Family Communication: Spoke directly to patient Disposition Plan: Transfer to floor  Consultants: Gastroenterology  Procedures: None  Antibiotics: None  HPI/Subjective: Currently denying chest pain or shortness of breath. No black or bloody stools. Complaining of being hungry.   Objective: Blood pressure 136/53, pulse 70, temperature 98.7 F (37.1 C), temperature source Oral, resp. rate 19, height 5' 2"  (1.575 m), weight 96 kg (211 lb 10.3 oz), SpO2 94.00%.  Intake/Output Summary (Last 24 hours) at 07/04/12 1431 Last data filed at 07/04/12 1200  Gross per 24 hour  Intake   1076 ml  Output    750 ml  Net    326 ml     Exam: General: No acute respiratory distress Lungs: Clear to auscultation bilaterally without wheezes or crackles Cardiovascular: Regular rate and rhythm without murmur gallop or rub normal S1 and S2 Abdomen: Nontender, nondistended, soft, bowel sounds positive, no rebound, no ascites, no appreciable mass Musculoskeletal: No significant cyanosis, clubbing of bilateral lower extremities Neurological: Patient is alert and oriented x3, moves all extremities x4 and ambulates easily in the room, exam nonfocal  Data Reviewed: Basic Metabolic Panel:  Lab 70/78/67 0455 07/03/12 1836  NA 141 137  K 3.3* 3.3*  CL 101 99  CO2 30 28  GLUCOSE 99 101*  BUN 9 11  CREATININE 0.71 0.68  CALCIUM 9.1 9.2  MG  1.9 --  PHOS 4.7* --   Liver Function Tests:  Lab 07/04/12 0455 07/03/12 1836  AST 13 17  ALT 10 11  ALKPHOS 70 77  BILITOT 1.3* 0.6  PROT 6.6 7.3  ALBUMIN 3.5 3.7   No results found for this basename: LIPASE:5,AMYLASE:5 in the last 168 hours No results found for this basename: AMMONIA:5 in the last 168 hours CBC:  Lab 07/04/12 1005 07/04/12 0455 07/03/12 1836  WBC 8.2 8.7 8.6  NEUTROABS -- -- --  HGB 9.3* 9.0* 7.5*  HCT 32.3* 30.9* 28.0*  MCV 68.1* 68.4* 65.1*  PLT 356 367 427*   Cardiac Enzymes:  Lab 07/03/12 2123  CKTOTAL --  CKMB --  CKMBINDEX --  TROPONINI <0.30   BNP (last 3 results)  Basename 07/03/12 2123  PROBNP 73.7   CBG: No results found for this basename: GLUCAP:5 in the last 168 hours  Recent Results (from the past 240 hour(s))  MRSA PCR SCREENING     Status: Normal   Collection Time   07/04/12  1:53 AM      Component Value Range Status Comment   MRSA by PCR NEGATIVE  NEGATIVE Final      Studies:  Recent x-ray studies have been reviewed in detail by the Attending Physician  Scheduled Meds:  Reviewed in detail by the Attending Physician   Erin Hearing, ANP [facility] Office  [phone] Pager [phone]  On-Call/Text Page:      [email or web address]      password [email or web address]  If 7PM-7AM, please contact night-coverage [email or web address] Password [email or web address] 07/04/2012, 2:31 PM   LOS: 1 day   I have examined the patient, reviewed the chart and agree with the above note which I have modified.   Debbe Odea, MD [phone]

## 2012-07-05 NOTE — Interval H&P Note (Signed)
History and Physical Interval Note:  07/05/2012 11:47 AM  Angelica [name]  has presented today for surgery, with the diagnosis of anemia pos stool  The various methods of treatment have been discussed with the patient and family. After consideration of risks, benefits and other options for treatment, the patient has consented to  Procedure(s) (LRB) with comments: ESOPHAGOGASTRODUODENOSCOPY (EGD) (N/A) COLONOSCOPY (N/A) as a surgical intervention .  The patient's history has been reviewed, patient examined, no change in status, stable for surgery.  I have reviewed the patient's chart and labs.  Questions were answered to the patient's satisfaction.     Duy Lemming JR,Dyshawn Cangelosi L

## 2012-07-06 DIAGNOSIS — R5381 Other malaise: Secondary | ICD-10-CM | POA: Diagnosis not present

## 2012-07-06 DIAGNOSIS — K921 Melena: Secondary | ICD-10-CM | POA: Diagnosis not present

## 2012-07-06 DIAGNOSIS — I10 Essential (primary) hypertension: Secondary | ICD-10-CM | POA: Diagnosis not present

## 2012-07-06 DIAGNOSIS — D509 Iron deficiency anemia, unspecified: Principal | ICD-10-CM

## 2012-07-06 DIAGNOSIS — D649 Anemia, unspecified: Secondary | ICD-10-CM | POA: Diagnosis not present

## 2012-07-06 MED ORDER — FERROUS SULFATE 300 (60 FE) MG/5ML PO SYRP: 300.0000 mg | ORAL_SOLUTION | Freq: Three times a day (TID) | ORAL | Status: DC

## 2012-07-06 MED ORDER — PANTOPRAZOLE SODIUM 40 MG PO PACK: 40.0000 mg | PACK | Freq: Two times a day (BID) | ORAL | Status: DC

## 2012-07-06 NOTE — Progress Notes (Signed)
Pt. discharge to floor,verbalized understanding of discharged instruction,medication,restriction,diet and follow up appointment.Baseline Vitals sign stable,Pt comfortable,no sign and symptom of distress. 

## 2012-07-06 NOTE — Discharge Summary (Signed)
Physician Discharge Summary  Angelica [name] QDI:[id number] DOB: Dec 23, 1940 DOA: 07/03/2012  PCP: Mayra Neer, MD  Admit date: 07/03/2012 Discharge date: 07/06/2012  Recommendations for Outpatient Follow-up:  1. CBC to see if hemoglobin is trending up. 2. Taking her iron supplements. 3. Follow up with Dr. Buford Dresser for repeated EGD  Discharge Diagnoses:  Principal Problem:  *Chronic anemia- symptomatic Active Problems:  Hypokalemia  Hypertension  Blood in stool  History of thyroid cancer- s/p total thyroidectomy 2012  Fatigue  Iron deficiency anemia  COPD (chronic obstructive pulmonary disease)- on nocturnal oxygen at home  GERD (gastroesophageal reflux disease)   Discharge Condition: Stable  Diet recommendation: Alert  Filed Weights   07/04/12 0211 07/04/12 1826  Weight: 96 kg (211 lb 10.3 oz) 95.9 kg (211 lb 6.7 oz)    History of present illness:  Angelica [name] is a 71 y.o. female has a past medical history of Hypertension; Arthritis; Asthma; Cancer; COPD (chronic obstructive pulmonary disease); and Thyroid disease.  Presented with She has had some shortness of breath for good while and was getting ready to get cardiac catheterization done to evaluate that. Some screening labs were done showing hg of 7.5. She never had any blood in stool denies black stool except if she eats chocolate. She is hemoccult positive.  Denies any weight loss. No abdominal pain. Last time she had a colonoscopy she cannot recollect thinks she had hx of polyps.  She has been eating lots of ICE and has craving for liver.    Hospital Course:  Chronic anemia- symptomatic/Iron deficiency anemia (07/04/2012) (07/03/2012) -She is status post 2 units of packed red blood cells since admission and follow-up hemoglobin is up. -she stopped the iron tablets at home.  -Gastroenterology/Dr. Oletta Lamas has evaluated the patient and is planning on proceeding with EGD and colonoscopy on 07/05/2012 that showed a hiatal  hernia,  biopsies were taking, GERD; diverticulosis and hemorrhoids. -Fecal occult positive.  -Follow CBC every 12 hours.   Hypokalemia (07/03/2012) -Oral repletion.  -Follow electrolytes.   Hypertension (07/03/2012) -Blood pressure well controlled.  -No changes were made  History of thyroid cancer- s/p total thyroidectomy 2012 (07/04/2012) -TSH was obtained at admission and as would be expected is very low at 0.044  -Continue home Synthroid   COPD (chronic obstructive pulmonary disease)- on nocturnal oxygen at home (07/04/2012) -Compensated without wheezing or dyspnea   Procedures:  EGD and colonoscopy 07/05/2012 (i.e. Studies not automatically included, echos, thoracentesis, etc; not x-rays)  Consultations:   Dr. Oletta Lamas  Discharge Exam: Filed Vitals:   07/05/12 1300 07/05/12 1406 07/05/12 2058 07/06/12 0540  BP: 138/92 119/72 122/79 117/48  Pulse:      Temp:  97.5 F (36.4 C) 98.1 F (36.7 C) 98.5 F (36.9 C)  TempSrc:  Oral Oral Oral  Resp: 20 20 16 20   Height:      Weight:      SpO2: 95% 95% 95% 94%    General: Awake alert and oriented x3 Cardiovascular: Regular rate and rhythm Respiratory: Good air movement clear to auscultation  Discharge Instructions  Discharge Orders    Future Orders Please Complete By Expires   Diet - low sodium heart healthy      Increase activity slowly          Medication List     As of 07/06/2012  7:05 AM    TAKE these medications         amLODipine 2.5 MG tablet   Commonly known as: NORVASC  Take 2.5 mg by mouth daily.      ferrous sulfate 300 (60 FE) MG/5ML syrup   Take 5 mLs (300 mg total) by mouth 3 (three) times daily with meals.      levothyroxine 125 MCG tablet   Commonly known as: SYNTHROID, LEVOTHROID   Take 125 mcg by mouth daily.      pantoprazole sodium 40 mg/20 mL Pack   Commonly known as: PROTONIX   Place 20 mLs (40 mg total) into feeding tube 2 (two) times daily.           Follow-up  Information    Follow up with SHAW,KIMBERLEE, MD. In 2 weeks.   Contact information:   [place]. [address] [place] [address] [phone]       Follow up with EDWARDS JR,JAMES L, MD. In 2 weeks. (hospital follow up)    Contact information:   [place]  [address] [phone]           The results of significant diagnostics from this hospitalization (including imaging, microbiology, ancillary and laboratory) are listed below for reference.    Significant Diagnostic Studies: No results found.  Microbiology: Recent Results (from the past 240 hour(s))  MRSA PCR SCREENING     Status: Normal   Collection Time   07/04/12  1:53 AM      Component Value Range Status Comment   MRSA by PCR NEGATIVE  NEGATIVE Final      Labs: Basic Metabolic Panel:  Lab 90/21/11 0620 07/04/12 0455 07/03/12 1836  NA 142 141 137  K 3.7 3.3* 3.3*  CL 104 101 99  CO2 27 30 28   GLUCOSE 104* 99 101*  BUN 7 9 11   CREATININE 0.69 0.71 0.68  CALCIUM 9.1 9.1 9.2  MG -- 1.9 --  PHOS -- 4.7* --   Liver Function Tests:  Lab 07/04/12 0455 07/03/12 1836  AST 13 17  ALT 10 11  ALKPHOS 70 77  BILITOT 1.3* 0.6  PROT 6.6 7.3  ALBUMIN 3.5 3.7   No results found for this basename: LIPASE:5,AMYLASE:5 in the last 168 hours No results found for this basename: AMMONIA:5 in the last 168 hours CBC:  Lab 07/05/12 1547 07/04/12 2326 07/04/12 1005 07/04/12 0455 07/03/12 1836  WBC 7.8 8.4 8.2 8.7 8.6  NEUTROABS -- -- -- -- --  HGB 9.4* 10.5* 9.3* 9.0* 7.5*  HCT 32.9* 35.7* 32.3* 30.9* 28.0*  MCV 69.6* 68.5* 68.1* 68.4* 65.1*  PLT 357 354 356 367 427*   Cardiac Enzymes:  Lab 07/03/12 2123  CKTOTAL --  CKMB --  CKMBINDEX --  TROPONINI <0.30   BNP: BNP (last 3 results)  Basename 07/03/12 2123  PROBNP 73.7   CBG: No results found for this basename: GLUCAP:5 in the last 168 hours  Time coordinating discharge: *40  minutes   Signed:  Charlynne Cousins  [facility] 07/06/2012, 7:05 AM

## 2012-07-07 NOTE — Progress Notes (Signed)
No NCM needs identified. Darnel Mchan RN CCM Case Mgmt phone [phone] 

## 2012-07-08 ENCOUNTER — Encounter (HOSPITAL_COMMUNITY): Payer: Self-pay | Admitting: Gastroenterology

## 2012-07-15 DIAGNOSIS — Z1331 Encounter for screening for depression: Secondary | ICD-10-CM | POA: Diagnosis not present

## 2012-07-15 DIAGNOSIS — K922 Gastrointestinal hemorrhage, unspecified: Secondary | ICD-10-CM | POA: Diagnosis not present

## 2012-07-15 DIAGNOSIS — E7800 Pure hypercholesterolemia, unspecified: Secondary | ICD-10-CM | POA: Diagnosis not present

## 2012-07-15 DIAGNOSIS — Z0000 Encounter for general adult medical examination without abnormal findings: Secondary | ICD-10-CM | POA: Diagnosis not present

## 2012-07-15 DIAGNOSIS — I10 Essential (primary) hypertension: Secondary | ICD-10-CM | POA: Diagnosis not present

## 2012-07-15 DIAGNOSIS — D649 Anemia, unspecified: Secondary | ICD-10-CM | POA: Diagnosis not present

## 2012-07-15 DIAGNOSIS — M810 Age-related osteoporosis without current pathological fracture: Secondary | ICD-10-CM | POA: Diagnosis not present

## 2012-07-18 DIAGNOSIS — D649 Anemia, unspecified: Secondary | ICD-10-CM | POA: Diagnosis not present

## 2012-07-18 DIAGNOSIS — K449 Diaphragmatic hernia without obstruction or gangrene: Secondary | ICD-10-CM | POA: Diagnosis not present

## 2012-08-19 DIAGNOSIS — E890 Postprocedural hypothyroidism: Secondary | ICD-10-CM | POA: Diagnosis not present

## 2012-08-27 DIAGNOSIS — M810 Age-related osteoporosis without current pathological fracture: Secondary | ICD-10-CM | POA: Diagnosis not present

## 2012-08-29 DIAGNOSIS — D509 Iron deficiency anemia, unspecified: Secondary | ICD-10-CM | POA: Diagnosis not present

## 2012-09-24 DIAGNOSIS — I10 Essential (primary) hypertension: Secondary | ICD-10-CM | POA: Diagnosis not present

## 2012-09-24 DIAGNOSIS — E669 Obesity, unspecified: Secondary | ICD-10-CM | POA: Diagnosis not present

## 2012-09-24 DIAGNOSIS — J449 Chronic obstructive pulmonary disease, unspecified: Secondary | ICD-10-CM | POA: Diagnosis not present

## 2012-09-24 DIAGNOSIS — D509 Iron deficiency anemia, unspecified: Secondary | ICD-10-CM | POA: Diagnosis not present

## 2012-09-24 DIAGNOSIS — R9439 Abnormal result of other cardiovascular function study: Secondary | ICD-10-CM | POA: Diagnosis not present

## 2012-09-26 ENCOUNTER — Other Ambulatory Visit: Payer: Self-pay | Admitting: Cardiology

## 2012-09-26 NOTE — H&P (Signed)
Office Visit     Patient: Angelica [name], Angelica [name] Provider: [name], MD  DOB: October 11, 1940 Age: 72 Y Sex: Female Date: 09/24/2012  Phone: [phone]   Address: [address], [place], [place]  Pcp: [name]       Subjective:     CC:    1. OV TO DISCUSS POSSIBLE CATH.        HPI:  General:  72 year old with abnormal nuclear stress test demonstrating a distal inferolateral wall ischemia with possible infarct. Her overall ejection fraction was normal on echocardiogram. She has a history of hypertension, COPD, obesity. Previously, cardiac catheterization was canceled because of anemia discovered during precardiac cath workup. Her hemoglobin was 7.2. This has been addressed. Dr. Oletta Lamas with gastroenterology. Overall cause of the patient's anemia is unclear. According to Dr. Brigitte Pulse, her stools are negative and she is on iron replacement. Her hemoglobin is now 13. overall she is not experiencing chest discomfort but she does have occasional bilateral arm/shoulder and occasional leg discomfort. She does feel better now that she is repleted from a hemoglobin standpoint. No active bleeding.  ECHO:  1. There is mild to moderate concentric left ventricle hypertrophy. 2. Left ventricular ejection fraction estimated by 2D at 60-65 percent. 3. There were no regional wall motion abnormalities. 4. Mild to moderate right ventricular dilatation. 5. Normal right ventricular systolic function. 6. Mild left atrial enlargement. 7. Mild right atrial enlargement. 8. Mildly thickened mitral valve. 9. Trace mitral valve regurgitation. 10. Trivial tricuspid regurgitation. 11. Moderately elevated estimated right ventricular systolic pressure. 12. Mild calcification of the aortic valve. 13. Analysis of mitral valve inflow, pulmonary vein Doppler and tissue Doppler suggests grade I diastolic dysfunction without elevated left atrial pressure. .        ROS:  The other elements of the review of  systems are negative (12 total elements).       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        Family History: Father: deceased Stroke, Hypertension Mother: deceased Diabetes Mellitus, CHF Paternal [place] Father: deceased Unknown Paternal [place] Mother: deceased Asthma Maternal Grand Father: deceased Stroke Maternal Grand Mother: deceased Stroke Brother 1: deceased Leukemia Brother2: deceased Liver problems Brother 3: deceased borderline Diabetes, kidney failure Sister 1: alive Lung problems, possible heart problems,Thyroid Cancer Sister 2: alive Hypertension, Hyperlipidemia 4 brother(s) .  Brother #4 -Living Hypertension;  Negative family hx of colon cancer, colon polyps or liver disease.       Social History:  General:  History of smoking cigarettes: Former smoker, Quit in year 2009.  no Smoking.  no Tobacco Exposure.  no Alcohol.  Caffeine: yes, 4-5 servings per day - tea.  no Recreational drug use.  no Exercise.  Occupation: unemployed, retired.  Marital Status: single, widowed.  Children: 2 son (both sons died - MVA, drowned).        Medications: Ferrous Sulfate 300 (60 Fe) MG/5ML Syrup 5 ml Twice a day, Pantoprazole Sodium 40 MG Tablet Delayed Release 1 tablet Once a day, Levothyroxine Sodium 125 MCG Tablet 1 tablet every morning on an empty stomach Once a day, Amlodipine Besylate 2.5 MG Tablet 1 tablet once a day, Medication List reviewed and reconciled with the patient       Allergies: Codeine Phosphate: rash: Allergy, Fosamax: rash: Allergy, Penicillin V  Potassium: rash: Allergy, Sulfamethoxazole: rash: Allergy, Nystatin: rash: Allergy.       Objective:     Vitals: Wt 209.6, Wt change 3.6 lb, Ht 62, BMI 38.33, Pulse sitting 82, BP sitting 140/90.        Examination:  General Examination:  GENERAL APPEARANCE alert, oriented, NAD, pleasant.  SKIN: normal, no rash.  HEENT: normal.  HEAD: [place]/AT.  EYES: EOMI, Conjunctiva clear.  NECK: supple, FROM, without evidence of thyromegaly, adenopathy, or bruits, no jugular venous distention (JVD).  LUNGS: clear to auscultation bilaterally, no wheezes, rhonchi, rales, regular breathing rate and effort.  HEART: regular rate and rhythm, no S3, S4, soft systolic right upper sternal border murmur or rub, point of maximul impulse (PMI) normal.  ABDOMEN: soft, non-tender/non-distended, bowel sounds present, no masses palpated, no bruit, obese.  EXTREMITIES: no clubbing, no edema, pulses 2 plus bilaterally.  NEUROLOGIC EXAM: non-focal exam, alert and oriented x 3.  PERIPHERAL PULSES: normal (2+) bilaterally.  LYMPH NODES: no cervical adenopathy.  PSYCH affect normal.  Exam performed in completion at prior consultation.       Assessment:     Assessment:  1. Other nonspecific abnormal cardiovascular system function study - 794.39 (Primary)  2. Essential hypertension, benign - 401.1  3. COPD, nos - 496  4. Obesity, unspecified - 278.00, Encouraged weight loss.  5. Iron deficiency anemia - 280.9    Plan:     1. Other nonspecific abnormal cardiovascular system function study  LAB: Basic Metabolic creatinine 0.8    GLUCOSE 106 70-99 - mg/dL H   BUN 14 6-26 - mg/dL    CREATININE 0.82 0.60-1.30 - mg/dl    eGFR (NON-AFRICAN AMERICAN) 68 >60 - calc    eGFR (AFRICAN AMERICAN) 83 >60 - calc    SODIUM 140 136-145 - mmol/L    POTASSIUM 4.0 3.5-5.5 - mmol/L    CHLORIDE 102 98-107 - mmol/L    C02 33 22-32 - mmol/L H   ANION GAP 9.7 6.0-20.0 - mmol/L    CALCIUM 9.3 8.6-10.3 - mg/dL     [facility] 09/24/2012 04:18:31 PM > okay for catheterization    LAB: CBC with Diff hemoglobin 13.8    WBC 6.2 4.0-11.0 - K/ul    RBC 5.23 4.20-5.40 - M/uL    HGB 13.8 12.0-16.0 - g/dL    HCT 42.5 37.0-47.0 - %    MCH  26.3 27.0-33.0 - pg L   MPV 7.8 7.5-10.7 - fL    MCV 81.2 81.0-99.0 - fL    MCHC 32.4 32.0-36.0 - g/dL    RDW 24.4 11.5-15.5 - % H   NRBC# 0.00 -    PLT 228 150-400 - K/uL    NEUT % 41.0 43.3-71.9 - % L   NRBC% 0.00 - %    LYMPH% 42.5 16.8-43.5 - %    MONO % 10.7 4.6-12.4 - %    EOS % 4.4 0.0-7.8 - %    BASO % 1.4 0.0-1.0 - % H   NEUT # 2.5 1.9-7.2 - K/uL    LYMPH# 2.60 1.10-2.70 - K/uL    MONO # 0.7 0.3-0.8 - K/uL    EOS # 0.3 0.0-0.6 - K/uL    BASO # 0.1 0.0-0.1 - K/uL     SKAINS,MARK 09/24/2012 04:18:13 PM > okay for catheterization    LAB: PT (Prothrombin Time) ([id number])    Prothrombin Time 11.4 9.1-12.0 - SEC    INR 1.1 0.8-1.2 -     Given the abnormality on the stress  test, we'll proceed with heart catheterization to ensure that she does not have any evidence of significant coronary artery disease. We will proceed with radial artery approach. Risks and benefits of cardiac catheterization have been reviewed including risk of stroke, heart attack, death, bleeding, renal impariment and arterial damage. There was ample oppurtuny to answer questions. Alternatives were discussed. Patient understands and wishes to proceed.        Procedures:  Venipuncture:  Venipuncture: Gasanova,Svetlana 09/24/2012 11:27:36 AM > , performed in right arm.        Immunizations:        Labs:        Procedure Codes: 82574 ECL BMP, 93552 ECL CBC PLATELET DIFF, 17471 BLOOD COLLECTION ROUTINE VENIPUNCTURE       Preventive:         Follow Up: post cath      Provider: [name], MD  Patient: Angelica [name], Angelica [name] DOB: 1941-03-12 Date: 09/24/2012

## 2012-09-27 ENCOUNTER — Inpatient Hospital Stay (HOSPITAL_BASED_OUTPATIENT_CLINIC_OR_DEPARTMENT_OTHER)
Admission: RE | Admit: 2012-09-27 | Discharge: 2012-09-27 | Disposition: A | Discharge status: Home / Self Care | Payer: Medicare Other | Source: Ambulatory Visit | Attending: Cardiology | Admitting: Cardiology

## 2012-09-27 ENCOUNTER — Encounter (HOSPITAL_BASED_OUTPATIENT_CLINIC_OR_DEPARTMENT_OTHER)
Admission: RE | Disposition: A | Discharge status: Home / Self Care | Payer: Self-pay | Source: Ambulatory Visit | Attending: Cardiology

## 2012-09-27 DIAGNOSIS — R9439 Abnormal result of other cardiovascular function study: Secondary | ICD-10-CM | POA: Diagnosis not present

## 2012-09-27 DIAGNOSIS — F17200 Nicotine dependence, unspecified, uncomplicated: Secondary | ICD-10-CM | POA: Insufficient documentation

## 2012-09-27 DIAGNOSIS — I059 Rheumatic mitral valve disease, unspecified: Secondary | ICD-10-CM | POA: Insufficient documentation

## 2012-09-27 DIAGNOSIS — I10 Essential (primary) hypertension: Secondary | ICD-10-CM | POA: Diagnosis not present

## 2012-09-27 DIAGNOSIS — J449 Chronic obstructive pulmonary disease, unspecified: Secondary | ICD-10-CM | POA: Insufficient documentation

## 2012-09-27 DIAGNOSIS — I079 Rheumatic tricuspid valve disease, unspecified: Secondary | ICD-10-CM | POA: Insufficient documentation

## 2012-09-27 DIAGNOSIS — I272 Pulmonary hypertension, unspecified: Secondary | ICD-10-CM

## 2012-09-27 DIAGNOSIS — J4489 Other specified chronic obstructive pulmonary disease: Secondary | ICD-10-CM | POA: Insufficient documentation

## 2012-09-27 DIAGNOSIS — I2789 Other specified pulmonary heart diseases: Secondary | ICD-10-CM | POA: Diagnosis not present

## 2012-09-27 DIAGNOSIS — E669 Obesity, unspecified: Secondary | ICD-10-CM | POA: Diagnosis not present

## 2012-09-27 DIAGNOSIS — I700 Atherosclerosis of aorta: Secondary | ICD-10-CM | POA: Insufficient documentation

## 2012-09-27 LAB — POCT I-STAT 3, ART BLOOD GAS (G3+)
Acid-Base Excess: 2 mmol/L (ref 0.0–2.0)
Bicarbonate: 27.3 mEq/L — ABNORMAL HIGH (ref 20.0–24.0)
O2 Saturation: 93 %
TCO2: 29 mmol/L (ref 0–100)

## 2012-09-27 LAB — POCT I-STAT 3, VENOUS BLOOD GAS (G3P V)
Acid-Base Excess: 2 mmol/L (ref 0.0–2.0)
Bicarbonate: 28.2 mEq/L — ABNORMAL HIGH (ref 20.0–24.0)

## 2012-09-27 SURGERY — JV LEFT HEART CATHETERIZATION WITH CORONARY ANGIOGRAM
Anesthesia: Moderate Sedation

## 2012-09-27 MED ORDER — SODIUM CHLORIDE 0.9 % IV SOLN: INTRAVENOUS | Status: DC

## 2012-09-27 MED ORDER — ACETAMINOPHEN 325 MG PO TABS: 650.0000 mg | ORAL_TABLET | ORAL | Status: DC | PRN

## 2012-09-27 MED ORDER — SODIUM CHLORIDE 0.9 % IV SOLN: 250.0000 mL | INTRAVENOUS | Status: DC | PRN

## 2012-09-27 MED ORDER — SODIUM CHLORIDE 0.9 % IJ SOLN: 3.0000 mL | Freq: Two times a day (BID) | INTRAMUSCULAR | Status: DC

## 2012-09-27 MED ORDER — SODIUM CHLORIDE 0.9 % IJ SOLN: 3.0000 mL | INTRAMUSCULAR | Status: DC | PRN

## 2012-09-27 MED ORDER — ASPIRIN 81 MG PO CHEW
324.0000 mg | CHEWABLE_TABLET | ORAL | Status: AC
  Administered 2012-09-27: 324 mg via ORAL

## 2012-09-27 MED ORDER — DIAZEPAM 5 MG PO TABS
5.0000 mg | ORAL_TABLET | ORAL | Status: AC
  Administered 2012-09-27: 5 mg via ORAL

## 2012-09-27 MED ORDER — SODIUM CHLORIDE 0.9 % IV SOLN: 1.0000 mL/kg/h | INTRAVENOUS | Status: DC

## 2012-09-27 MED ORDER — ONDANSETRON HCL 4 MG/2ML IJ SOLN: 4.0000 mg | Freq: Four times a day (QID) | INTRAMUSCULAR | Status: DC | PRN

## 2012-09-27 NOTE — OR Nursing (Signed)
Dr Skains at bedside to discuss results and treatment plan with pt and family 

## 2012-09-27 NOTE — OR Nursing (Signed)
Meal served 

## 2012-09-27 NOTE — Interval H&P Note (Signed)
History and Physical Interval Note:  09/27/2012 9:35 AM  Angelica [name]  has presented today for surgery, with the diagnosis of chest pain  The various methods of treatment have been discussed with the patient and family. After consideration of risks, benefits and other options for treatment, the patient has consented to  Procedure(s) (LRB) with comments: JV LEFT HEART CATHETERIZATION WITH CORONARY ANGIOGRAM (N/A) as a surgical intervention .  The patient's history has been reviewed, patient examined, no change in status, stable for surgery.  I have reviewed the patient's chart and labs.  Questions were answered to the patient's satisfaction.     Embry Huss

## 2012-09-27 NOTE — OR Nursing (Signed)
Discharge instructions reviewed and signed, pt stated understanding, ambulated in hall without difficulty, site level 0, transported to relative's car via wheelchair

## 2012-09-27 NOTE — CV Procedure (Signed)
CARDIAC CATHETERIZATION  PROCEDURE:  Right and left heart catheterization with selective coronary angiography, left ventriculogram, cardiac outputs, selective oxygen saturations.  INDICATIONS:  72 year old female with abnormal nuclear stress test demonstrating distal inferolateral wall ischemia with possible infarct pattern. Ejection fraction normal. Elevated estimated right ventricular systolic pressure 48 mmHg. Has a history of smoking, COPD, obesity, hypertension. Recent anemia that has been resolved.  The risks, benefits, and details of the procedure were explained to the patient, including stroke, heart attack, death, renal impairment, arterial damage, bleeding.  The patient verbalized understanding and wanted to proceed.  Informed written consent was obtained.  PROCEDURE TECHNIQUE:  After Xylocaine anesthesia and visualization of the femoral head via fluoroscopy, a 18F sheath was placed in the right femoral artery using the modified Seldinger technique.  A 7 French sheath was inserted into the right femoral vein using the modified Seldinger technique. Left coronary angiography was done using a Judkins L4 catheter.  Right coronary angiography was done using a Judkins R4 catheter. Multiple views with hand injection of Omnipaque were obtained. Left ventriculography was done using a pigtail catheter in the RAO position with power injection. Right heart catheterization was performed with a balloon tipped Swan-Ganz catheter traversing the right-sided heart structures to the wedge position. Oxygen saturation was drawn in the pulmonary artery as well as femoral artery. Hemodynamics were obtained. Cardiac outputs were obtained utilizing the Fick and thermodilution methods. Following the procedure, sheaths were removed and patient was hemodynamically stable.   CONTRAST:  Total of 70 ml.  FLOUROSCOPY TIME: 3.2 minutes.  COMPLICATIONS:  None.    HEMODYNAMICS:    Right atrium (RA): 6/2/2 mmHg Right  ventricle (RV): 27/4/7 mmHg Pulmonary artery (PA): 24/8/16 mmHg Pulmonary capillary wedge pressure (PCWP): 5/4/3 mmHg  Cardiac output: Fick 5.1 liters/min Cardiac index: Fick 2.6  PA saturation: 67 % FA saturation: 93 %   Aortic pressure: 152/82 mmHg LV pressure: 381 mmHg systolic; LVEDP 12 mmHg.   There was no gradient between the left ventricle and aorta.    ANGIOGRAPHIC DATA:    Left main: Branches into LAD and circumflex, no angiographically significant disease  Left anterior descending (LAD): 1 Maj. branching diagonal, LAD then continues to be tortuous to the apex. No angiographically significant disease present.   Circumflex artery (CIRC): There is one large obtuse marginal branch no significant luminal irregularities.  Right coronary artery (RCA): Dominant vessel giving rise to the posterior descending artery large acute marginal branch supplying lateral wall area tortuous vessel distally but no angiographically significant disease present.  LEFT VENTRICULOGRAM:  Left ventricular angiogram was done in the 30 RAO projection and revealed normal left ventricular wall motion and systolic function with an estimated ejection fraction of 60%.   IMPRESSIONS:  1. No angiographically significant coronary artery disease. 2. Normal left ventricular systolic function.  LVEDP 12 mmHg.  Ejection fraction 60%. 3. Normal right-sided heart pressures. 4. Normal cardiac output.  RECOMMENDATION:  Very reassuring cardiac catheterization. Reassuring right heart catheterization with no evidence of pulmonary hypertension as was suggested on echocardiogram. Continue to promote blood pressure with goal of less than 829 systolic. Continue with activity/exercise. She states that she is very active woman. Continue to monitor for signs of anemia. It is likely that her initial anemic episode precipitated her prior symptoms.

## 2012-09-27 NOTE — H&P (View-Only) (Signed)
Office Visit     Patient: Angelica [name], Loudenslager Provider: [name], MD  DOB: 03-28-1941 Age: 72 Y Sex: Female Date: 09/24/2012  Phone: [phone]   Address: [address], [place], [place]  Pcp: [name]       Subjective:     CC:    1. OV TO DISCUSS POSSIBLE CATH.        HPI:  General:  72 year old with abnormal nuclear stress test demonstrating a distal inferolateral wall ischemia with possible infarct. Her overall ejection fraction was normal on echocardiogram. She has a history of hypertension, COPD, obesity. Previously, cardiac catheterization was canceled because of anemia discovered during precardiac cath workup. Her hemoglobin was 7.2. This has been addressed. Dr. Oletta Lamas with gastroenterology. Overall cause of the patient's anemia is unclear. According to Dr. Brigitte Pulse, her stools are negative and she is on iron replacement. Her hemoglobin is now 13. overall she is not experiencing chest discomfort but she does have occasional bilateral arm/shoulder and occasional leg discomfort. She does feel better now that she is repleted from a hemoglobin standpoint. No active bleeding.  ECHO:  1. There is mild to moderate concentric left ventricle hypertrophy. 2. Left ventricular ejection fraction estimated by 2D at 60-65 percent. 3. There were no regional wall motion abnormalities. 4. Mild to moderate right ventricular dilatation. 5. Normal right ventricular systolic function. 6. Mild left atrial enlargement. 7. Mild right atrial enlargement. 8. Mildly thickened mitral valve. 9. Trace mitral valve regurgitation. 10. Trivial tricuspid regurgitation. 11. Moderately elevated estimated right ventricular systolic pressure. 12. Mild calcification of the aortic valve. 13. Analysis of mitral valve inflow, pulmonary vein Doppler and tissue Doppler suggests grade I diastolic dysfunction without elevated left atrial pressure. .        ROS:  The other elements of the review of  systems are negative (12 total elements).       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        Family History: Father: deceased Stroke, Hypertension Mother: deceased Diabetes Mellitus, CHF Paternal Downs Father: deceased Unknown Paternal [place] Mother: deceased Asthma Maternal Grand Father: deceased Stroke Maternal Grand Mother: deceased Stroke Brother 1: deceased Leukemia Brother2: deceased Liver problems Brother 3: deceased borderline Diabetes, kidney failure Sister 1: alive Lung problems, possible heart problems,Thyroid Cancer Sister 2: alive Hypertension, Hyperlipidemia 4 brother(s) .  Brother #4 -Living Hypertension;  Negative family hx of colon cancer, colon polyps or liver disease.       Social History:  General:  History of smoking cigarettes: Former smoker, Quit in year 2009.  no Smoking.  no Tobacco Exposure.  no Alcohol.  Caffeine: yes, 4-5 servings per day - tea.  no Recreational drug use.  no Exercise.  Occupation: unemployed, retired.  Marital Status: single, widowed.  Children: 2 son (both sons died - MVA, drowned).        Medications: Ferrous Sulfate 300 (60 Fe) MG/5ML Syrup 5 ml Twice a day, Pantoprazole Sodium 40 MG Tablet Delayed Release 1 tablet Once a day, Levothyroxine Sodium 125 MCG Tablet 1 tablet every morning on an empty stomach Once a day, Amlodipine Besylate 2.5 MG Tablet 1 tablet once a day, Medication List reviewed and reconciled with the patient       Allergies: Codeine Phosphate: rash: Allergy, Fosamax: rash: Allergy, Penicillin V  Potassium: rash: Allergy, Sulfamethoxazole: rash: Allergy, Nystatin: rash: Allergy.       Objective:     Vitals: Wt 209.6, Wt change 3.6 lb, Ht 62, BMI 38.33, Pulse sitting 82, BP sitting 140/90.        Examination:  General Examination:  GENERAL APPEARANCE alert, oriented, NAD, pleasant.  SKIN: normal, no rash.  HEENT: normal.  HEAD: [place]/AT.  EYES: EOMI, Conjunctiva clear.  NECK: supple, FROM, without evidence of thyromegaly, adenopathy, or bruits, no jugular venous distention (JVD).  LUNGS: clear to auscultation bilaterally, no wheezes, rhonchi, rales, regular breathing rate and effort.  HEART: regular rate and rhythm, no S3, S4, soft systolic right upper sternal border murmur or rub, point of maximul impulse (PMI) normal.  ABDOMEN: soft, non-tender/non-distended, bowel sounds present, no masses palpated, no bruit, obese.  EXTREMITIES: no clubbing, no edema, pulses 2 plus bilaterally.  NEUROLOGIC EXAM: non-focal exam, alert and oriented x 3.  PERIPHERAL PULSES: normal (2+) bilaterally.  LYMPH NODES: no cervical adenopathy.  PSYCH affect normal.  Exam performed in completion at prior consultation.       Assessment:     Assessment:  1. Other nonspecific abnormal cardiovascular system function study - 794.39 (Primary)  2. Essential hypertension, benign - 401.1  3. COPD, nos - 496  4. Obesity, unspecified - 278.00, Encouraged weight loss.  5. Iron deficiency anemia - 280.9    Plan:     1. Other nonspecific abnormal cardiovascular system function study  LAB: Basic Metabolic creatinine 0.8    GLUCOSE 106 70-99 - mg/dL H   BUN 14 6-26 - mg/dL    CREATININE 0.82 0.60-1.30 - mg/dl    eGFR (NON-AFRICAN AMERICAN) 68 >60 - calc    eGFR (AFRICAN AMERICAN) 83 >60 - calc    SODIUM 140 136-145 - mmol/L    POTASSIUM 4.0 3.5-5.5 - mmol/L    CHLORIDE 102 98-107 - mmol/L    C02 33 22-32 - mmol/L H   ANION GAP 9.7 6.0-20.0 - mmol/L    CALCIUM 9.3 8.6-10.3 - mg/dL     [facility] 09/24/2012 04:18:31 PM > okay for catheterization    LAB: CBC with Diff hemoglobin 13.8    WBC 6.2 4.0-11.0 - K/ul    RBC 5.23 4.20-5.40 - M/uL    HGB 13.8 12.0-16.0 - g/dL    HCT 42.5 37.0-47.0 - %    MCH  26.3 27.0-33.0 - pg L   MPV 7.8 7.5-10.7 - fL    MCV 81.2 81.0-99.0 - fL    MCHC 32.4 32.0-36.0 - g/dL    RDW 24.4 11.5-15.5 - % H   NRBC# 0.00 -    PLT 228 150-400 - K/uL    NEUT % 41.0 43.3-71.9 - % L   NRBC% 0.00 - %    LYMPH% 42.5 16.8-43.5 - %    MONO % 10.7 4.6-12.4 - %    EOS % 4.4 0.0-7.8 - %    BASO % 1.4 0.0-1.0 - % H   NEUT # 2.5 1.9-7.2 - K/uL    LYMPH# 2.60 1.10-2.70 - K/uL    MONO # 0.7 0.3-0.8 - K/uL    EOS # 0.3 0.0-0.6 - K/uL    BASO # 0.1 0.0-0.1 - K/uL     Ayva Veilleux 09/24/2012 04:18:13 PM > okay for catheterization    LAB: PT (Prothrombin Time) (882800)    Prothrombin Time 11.4 9.1-12.0 - SEC    INR 1.1 0.8-1.2 -     Given the abnormality on the stress  test, we'll proceed with heart catheterization to ensure that she does not have any evidence of significant coronary artery disease. We will proceed with radial artery approach. Risks and benefits of cardiac catheterization have been reviewed including risk of stroke, heart attack, death, bleeding, renal impariment and arterial damage. There was ample oppurtuny to answer questions. Alternatives were discussed. Patient understands and wishes to proceed.        Procedures:  Venipuncture:  Venipuncture: Gasanova,Svetlana 09/24/2012 11:27:36 AM > , performed in right arm.        Immunizations:        Labs:        Procedure Codes: 02890 ECL BMP, 22840 ECL CBC PLATELET DIFF, 69861 BLOOD COLLECTION ROUTINE VENIPUNCTURE       Preventive:         Follow Up: post cath      Provider: [name], MD  Patient: [place], Grieb DOB: [birth year] Date: 09/24/2012

## 2012-09-27 NOTE — OR Nursing (Signed)
Tegaderm dressing applied, site level 0, bedrest begins at 1030

## 2012-09-27 NOTE — OR Nursing (Signed)
+  Allen's test right hand 

## 2012-09-27 NOTE — Progress Notes (Signed)
Pt voided 200cc of amber color urine.  Tolerating fluids well.

## 2012-10-04 DIAGNOSIS — I10 Essential (primary) hypertension: Secondary | ICD-10-CM | POA: Diagnosis not present

## 2012-10-04 DIAGNOSIS — Z48812 Encounter for surgical aftercare following surgery on the circulatory system: Secondary | ICD-10-CM | POA: Diagnosis not present

## 2012-11-20 DIAGNOSIS — E890 Postprocedural hypothyroidism: Secondary | ICD-10-CM | POA: Diagnosis not present

## 2012-12-11 DIAGNOSIS — D509 Iron deficiency anemia, unspecified: Secondary | ICD-10-CM | POA: Diagnosis not present

## 2013-01-13 DIAGNOSIS — J449 Chronic obstructive pulmonary disease, unspecified: Secondary | ICD-10-CM | POA: Diagnosis not present

## 2013-01-13 DIAGNOSIS — M1990 Unspecified osteoarthritis, unspecified site: Secondary | ICD-10-CM | POA: Diagnosis not present

## 2013-01-13 DIAGNOSIS — D509 Iron deficiency anemia, unspecified: Secondary | ICD-10-CM | POA: Diagnosis not present

## 2013-01-13 DIAGNOSIS — E7800 Pure hypercholesterolemia, unspecified: Secondary | ICD-10-CM | POA: Diagnosis not present

## 2013-01-13 DIAGNOSIS — Z6838 Body mass index (BMI) 38.0-38.9, adult: Secondary | ICD-10-CM | POA: Diagnosis not present

## 2013-01-13 DIAGNOSIS — I10 Essential (primary) hypertension: Secondary | ICD-10-CM | POA: Diagnosis not present

## 2013-01-13 DIAGNOSIS — K222 Esophageal obstruction: Secondary | ICD-10-CM | POA: Diagnosis not present

## 2013-01-29 DIAGNOSIS — R21 Rash and other nonspecific skin eruption: Secondary | ICD-10-CM | POA: Diagnosis not present

## 2013-01-31 ENCOUNTER — Other Ambulatory Visit: Payer: Self-pay

## 2013-01-31 DIAGNOSIS — Z1231 Encounter for screening mammogram for malignant neoplasm of breast: Secondary | ICD-10-CM

## 2013-03-06 ENCOUNTER — Ambulatory Visit: Payer: Medicare Other

## 2013-03-06 ENCOUNTER — Ambulatory Visit
Admission: RE | Admit: 2013-03-06 | Discharge: 2013-03-06 | Disposition: A | Discharge status: Home / Self Care | Payer: Medicare Other | Source: Ambulatory Visit

## 2013-03-06 DIAGNOSIS — Z1231 Encounter for screening mammogram for malignant neoplasm of breast: Secondary | ICD-10-CM | POA: Diagnosis not present

## 2013-05-22 DIAGNOSIS — D509 Iron deficiency anemia, unspecified: Secondary | ICD-10-CM | POA: Diagnosis not present

## 2013-05-22 DIAGNOSIS — C73 Malignant neoplasm of thyroid gland: Secondary | ICD-10-CM | POA: Diagnosis not present

## 2013-05-23 DIAGNOSIS — E890 Postprocedural hypothyroidism: Secondary | ICD-10-CM | POA: Diagnosis not present

## 2013-05-23 DIAGNOSIS — Z833 Family history of diabetes mellitus: Secondary | ICD-10-CM | POA: Diagnosis not present

## 2013-05-23 DIAGNOSIS — C73 Malignant neoplasm of thyroid gland: Secondary | ICD-10-CM | POA: Diagnosis not present

## 2013-06-23 DIAGNOSIS — J069 Acute upper respiratory infection, unspecified: Secondary | ICD-10-CM | POA: Diagnosis not present

## 2013-06-23 DIAGNOSIS — I10 Essential (primary) hypertension: Secondary | ICD-10-CM | POA: Diagnosis not present

## 2013-06-23 DIAGNOSIS — D509 Iron deficiency anemia, unspecified: Secondary | ICD-10-CM | POA: Diagnosis not present

## 2013-06-23 DIAGNOSIS — E890 Postprocedural hypothyroidism: Secondary | ICD-10-CM | POA: Diagnosis not present

## 2013-06-23 DIAGNOSIS — J449 Chronic obstructive pulmonary disease, unspecified: Secondary | ICD-10-CM | POA: Diagnosis not present

## 2013-07-11 DIAGNOSIS — C73 Malignant neoplasm of thyroid gland: Secondary | ICD-10-CM | POA: Diagnosis not present

## 2013-08-25 DIAGNOSIS — E890 Postprocedural hypothyroidism: Secondary | ICD-10-CM | POA: Diagnosis not present

## 2013-09-26 DIAGNOSIS — D649 Anemia, unspecified: Secondary | ICD-10-CM | POA: Diagnosis not present

## 2013-09-26 DIAGNOSIS — I10 Essential (primary) hypertension: Secondary | ICD-10-CM | POA: Diagnosis not present

## 2013-09-26 DIAGNOSIS — L909 Atrophic disorder of skin, unspecified: Secondary | ICD-10-CM | POA: Diagnosis not present

## 2013-09-26 DIAGNOSIS — L919 Hypertrophic disorder of the skin, unspecified: Secondary | ICD-10-CM | POA: Diagnosis not present

## 2013-09-26 DIAGNOSIS — J449 Chronic obstructive pulmonary disease, unspecified: Secondary | ICD-10-CM | POA: Diagnosis not present

## 2013-09-26 DIAGNOSIS — M810 Age-related osteoporosis without current pathological fracture: Secondary | ICD-10-CM | POA: Diagnosis not present

## 2013-09-26 DIAGNOSIS — R7989 Other specified abnormal findings of blood chemistry: Secondary | ICD-10-CM | POA: Diagnosis not present

## 2013-10-22 ENCOUNTER — Telehealth: Payer: Self-pay | Admitting: Cardiology

## 2013-10-22 NOTE — Telephone Encounter (Signed)
Oxygen renewal

## 2013-11-06 DIAGNOSIS — D509 Iron deficiency anemia, unspecified: Secondary | ICD-10-CM | POA: Diagnosis not present

## 2013-11-06 DIAGNOSIS — K120 Recurrent oral aphthae: Secondary | ICD-10-CM | POA: Diagnosis not present

## 2013-11-07 ENCOUNTER — Telehealth: Payer: Self-pay | Admitting: Hematology and Oncology

## 2013-11-07 NOTE — Telephone Encounter (Signed)
C/D 11/07/13 for appt. 11/10/13

## 2013-11-07 NOTE — Telephone Encounter (Signed)
S/W [facility] W/CHRISTINA @ DR. SHAMLEFFER OFFCE AND GAVE NEW PATIENT APPT 02/16 @ 1:30 W/DR. [place].  REFERRING DR. SHAMLEFFER DX- CHRONIC HX IDA. FOBT NEG LAST YEAR, UNABLE TOLERATE ORAL IRON

## 2013-11-10 ENCOUNTER — Ambulatory Visit: Payer: Medicare Other

## 2013-11-10 ENCOUNTER — Ambulatory Visit (HOSPITAL_BASED_OUTPATIENT_CLINIC_OR_DEPARTMENT_OTHER): Payer: Medicare Other | Admitting: Hematology and Oncology

## 2013-11-10 ENCOUNTER — Encounter: Payer: Self-pay | Admitting: Hematology and Oncology

## 2013-11-10 ENCOUNTER — Ambulatory Visit (HOSPITAL_BASED_OUTPATIENT_CLINIC_OR_DEPARTMENT_OTHER): Payer: Medicare Other

## 2013-11-10 ENCOUNTER — Telehealth: Payer: Self-pay | Admitting: Hematology and Oncology

## 2013-11-10 VITALS — BP 140/86 | HR 84 | Temp 97.1°F | Resp 18 | Ht 63.0 in | Wt 222.1 lb

## 2013-11-10 DIAGNOSIS — D509 Iron deficiency anemia, unspecified: Secondary | ICD-10-CM | POA: Diagnosis not present

## 2013-11-10 DIAGNOSIS — R5383 Other fatigue: Secondary | ICD-10-CM

## 2013-11-10 DIAGNOSIS — R5381 Other malaise: Secondary | ICD-10-CM | POA: Diagnosis not present

## 2013-11-10 DIAGNOSIS — K219 Gastro-esophageal reflux disease without esophagitis: Secondary | ICD-10-CM | POA: Diagnosis not present

## 2013-11-10 DIAGNOSIS — D649 Anemia, unspecified: Secondary | ICD-10-CM | POA: Diagnosis not present

## 2013-11-10 LAB — CBC & DIFF AND RETIC
BASO%: 0.8 % (ref 0.0–2.0)
BASOS ABS: 0.1 10*3/uL (ref 0.0–0.1)
EOS%: 3.4 % (ref 0.0–7.0)
Eosinophils Absolute: 0.3 10*3/uL (ref 0.0–0.5)
HCT: 27.7 % — ABNORMAL LOW (ref 34.8–46.6)
HEMOGLOBIN: 7.6 g/dL — AB (ref 11.6–15.9)
Immature Retic Fract: 17.4 % — ABNORMAL HIGH (ref 1.60–10.00)
LYMPH%: 30.9 % (ref 14.0–49.7)
MCH: 20.1 pg — AB (ref 25.1–34.0)
MCHC: 27.4 g/dL — ABNORMAL LOW (ref 31.5–36.0)
MCV: 73.3 fL — AB (ref 79.5–101.0)
MONO#: 0.6 10*3/uL (ref 0.1–0.9)
MONO%: 7.9 % (ref 0.0–14.0)
NEUT#: 4.2 10*3/uL (ref 1.5–6.5)
NEUT%: 57 % (ref 38.4–76.8)
Platelets: 411 10*3/uL — ABNORMAL HIGH (ref 145–400)
RBC: 3.78 10*6/uL (ref 3.70–5.45)
RDW: 16.6 % — AB (ref 11.2–14.5)
RETIC CT ABS: 71.06 10*3/uL (ref 33.70–90.70)
Retic %: 1.88 % (ref 0.70–2.10)
WBC: 7.3 10*3/uL (ref 3.9–10.3)
lymph#: 2.3 10*3/uL (ref 0.9–3.3)

## 2013-11-10 LAB — COMPREHENSIVE METABOLIC PANEL (CC13)
ALBUMIN: 3.8 g/dL (ref 3.5–5.0)
ALK PHOS: 76 U/L (ref 40–150)
ALT: 17 U/L (ref 0–55)
AST: 19 U/L (ref 5–34)
Anion Gap: 11 mEq/L (ref 3–11)
BILIRUBIN TOTAL: 0.74 mg/dL (ref 0.20–1.20)
BUN: 13.2 mg/dL (ref 7.0–26.0)
CO2: 27 mEq/L (ref 22–29)
CREATININE: 0.8 mg/dL (ref 0.6–1.1)
Calcium: 9.1 mg/dL (ref 8.4–10.4)
Chloride: 103 mEq/L (ref 98–109)
Glucose: 116 mg/dl (ref 70–140)
Potassium: 3.7 mEq/L (ref 3.5–5.1)
Sodium: 141 mEq/L (ref 136–145)
Total Protein: 7.1 g/dL (ref 6.4–8.3)

## 2013-11-10 LAB — HOLD TUBE, BLOOD BANK

## 2013-11-10 LAB — LACTATE DEHYDROGENASE (CC13): LDH: 195 U/L (ref 125–245)

## 2013-11-10 LAB — IRON AND TIBC CHCC
%SAT: 3 % — ABNORMAL LOW (ref 21–57)
Iron: 16 ug/dL — ABNORMAL LOW (ref 41–142)
TIBC: 470 ug/dL — ABNORMAL HIGH (ref 236–444)
UIBC: 454 ug/dL — AB (ref 120–384)

## 2013-11-10 LAB — FERRITIN CHCC: FERRITIN: 4 ng/mL — AB (ref 9–269)

## 2013-11-10 NOTE — Progress Notes (Signed)
Checked in new patient with no financial issues. She said she goes to [facility] on [address] and can't think of her dr name. Brigitte Pulse works ptime now.

## 2013-11-10 NOTE — Telephone Encounter (Signed)
Gave pt appt for  md for 1 wk, sent pt to labs today

## 2013-11-11 LAB — GLIA (IGA/G) + TTG IGA
Gliadin IgA: 6.2 U/mL (ref <20)
Gliadin IgG: 69.1 U/mL — ABNORMAL HIGH (ref <20)
TISSUE TRANSGLUTAMINASE AB, IGA: 3.7 U/mL (ref <20)

## 2013-11-11 LAB — DIRECT ANTIGLOBULIN TEST (NOT AT ARMC)
DAT (Complement): NEGATIVE
DAT IgG: NEGATIVE

## 2013-11-11 LAB — SEDIMENTATION RATE: SED RATE: 13 mm/h (ref 0–22)

## 2013-11-11 NOTE — Progress Notes (Signed)
[facility] NOTE  Patient Care Team: Mayra Neer, MD as PCP - General (Family Medicine)  CHIEF COMPLAINTS/PURPOSE OF CONSULTATION:  Severe iron deficiency anemia, inability to tolerate oral ion supplements  HISTORY OF PRESENTING ILLNESS:  Angelica [name] 73 y.o. female is here because of progressive iron deficiency anemia. This patient has been anemic for many years. I was able to review her CBC from 2010 and noted that she had progressive microcytic anemia. In 2013, she has severe anemia requiring blood transfusion. She underwent EGD and colonoscopy which only showed a large hiatal hernia but no evidence of ulcer or source of bleeding. She had significant pica with excessive chewing of ice.  She denies recent chest pain on exertion, shortness of breath on minimal exertion, pre-syncopal episodes, or palpitations. She had not noticed any recent bleeding such as epistaxis, hematuria or hematochezia The patient denies over the counter NSAID ingestion. She is not on antiplatelets agents. Her last colonoscopy was in 2013 She had prior history of thyroid cancer and was treated with radioactive iodine treatment after surgery. Her age appropriate screening programs are up-to-date. She eats a variety of diet. She never donated blood  The patient was prescribed oral iron supplements and she takes liquid iron and that caused significant mouth sore. Recently she was put on another iron formula for cough which she take it with meals. She was referred here specifically for possible iron infusion due to inability to tolerate oral iron.  MEDICAL HISTORY:  Past Medical History  Diagnosis Date  . Hypertension   . Arthritis   . Asthma   . Cancer     thyroid  . COPD (chronic obstructive pulmonary disease)   . Thyroid disease   . Anemia     SURGICAL HISTORY: Past Surgical History  Procedure Laterality Date  . Abdominal hysterectomy    . Tonsillectomy    . Cholecystectomy     . Esophagogastroduodenoscopy  07/05/2012    Procedure: ESOPHAGOGASTRODUODENOSCOPY (EGD);  Surgeon: Winfield Cunas., MD;  Location: [facility] ENDOSCOPY;  Service: Endoscopy;  Laterality: N/A;  . Colonoscopy  07/05/2012    Procedure: COLONOSCOPY;  Surgeon: Winfield Cunas., MD;  Location: [facility] ENDOSCOPY;  Service: Endoscopy;  Laterality: N/A;    SOCIAL HISTORY: History   Social History  . Marital Status: Widowed    Spouse Name: N/A    Number of Children: N/A  . Years of Education: N/A   Occupational History  . Not on file.   Social History Main Topics  . Smoking status: Former Smoker    Quit date: 07/03/2008  . Smokeless tobacco: Never Used  . Alcohol Use: No  . Drug Use: No  . Sexual Activity: Not on file   Other Topics Concern  . Not on file   Social History Narrative  . No narrative on file    FAMILY HISTORY: Family History  Problem Relation Age of Onset  . Heart disease Mother   . Kidney failure Father   . Cancer Sister     thyroid cancer    ALLERGIES:  is allergic to other; fosamax; penicillins; and sulfa antibiotics.  MEDICATIONS:  Current Outpatient Prescriptions  Medication Sig Dispense Refill  . amLODipine (NORVASC) 2.5 MG tablet Take 2.5 mg by mouth daily.      . ferrous sulfate 300 (60 FE) MG/5ML syrup Take 5 mLs (300 mg total) by mouth 3 (three) times daily with meals.  150 mL  5  . levothyroxine (SYNTHROID, LEVOTHROID)  125 MCG tablet Take 125 mcg by mouth daily.      . pantoprazole sodium (PROTONIX) 40 mg/20 mL PACK Place 20 mLs (40 mg total) into feeding tube 2 (two) times daily.  60 each  3  . diazepam (VALIUM) 5 MG tablet Take 5 mg by mouth every 6 (six) hours as needed for anxiety.       No current facility-administered medications for this visit.    REVIEW OF SYSTEMS:   Constitutional: Denies fevers, chills or abnormal night sweats Eyes: Denies blurriness of vision, double vision or watery eyes Ears, nose, mouth, throat, and face: Denies  mucositis or sore throat Respiratory: Denies cough, dyspnea or wheezes Cardiovascular: Denies palpitation, chest discomfort or lower extremity swelling Gastrointestinal:  Denies nausea, heartburn or change in bowel habits Skin: Denies abnormal skin rashes Lymphatics: Denies new lymphadenopathy or easy bruising Neurological:Denies numbness, tingling or new weaknesses Behavioral/Psych: Mood is stable, no new changes  All other systems were reviewed with the patient and are negative.  PHYSICAL EXAMINATION: ECOG PERFORMANCE STATUS: 1 - Symptomatic but completely ambulatory  Filed Vitals:   11/10/13 1353  BP: 140/86  Pulse: 84  Temp: 97.1 F (36.2 C)  Resp: 18   Filed Weights   11/10/13 1353  Weight: 222 lb 1.6 oz (100.744 kg)    GENERAL:alert, no distress and comfortable. She is obese SKIN: skin color overall is pale, texture, turgor are normal, no rashes or significant lesions EYES: normal, conjunctiva are pale and non-injected, sclera clear OROPHARYNX:no exudate, no erythema and lips, buccal mucosa, and tongue normal  NECK: supple, noted thyroid surgery scar LYMPH:  no palpable lymphadenopathy in the cervical, axillary or inguinal LUNGS: clear to auscultation and percussion with normal breathing effort HEART: regular rate & rhythm and no murmurs and no lower extremity edema ABDOMEN:abdomen soft, non-tender and normal bowel sounds Musculoskeletal:no cyanosis of digits and no clubbing  PSYCH: alert & oriented x 3 with fluent speech NEURO: no focal motor/sensory deficits  LABORATORY DATA:  I have reviewed the data as listed Recent Results (from the past 2160 hour(s))  IRON AND TIBC [facility]     Status: Abnormal   Collection Time    11/10/13  2:49 PM      Result Value Ref Range   Iron 16 (*) 41 - 142 ug/dL   TIBC 470 (*) 236 - 444 ug/dL   UIBC 454 (*) 120 - 384 ug/dL   %SAT 3 (*) 21 - 57 %  HOLD TUBE, BLOOD BANK     Status: None   Collection Time    11/10/13  2:49 PM       Result Value Ref Range   Hold Tube, Blood Bank Blood Bank Order Cancelled    CBC & DIFF AND RETIC     Status: Abnormal   Collection Time    11/10/13  2:49 PM      Result Value Ref Range   WBC 7.3  3.9 - 10.3 10e3/uL   NEUT# 4.2  1.5 - 6.5 10e3/uL   HGB 7.6 (*) 11.6 - 15.9 g/dL   HCT 27.7 (*) 34.8 - 46.6 %   Platelets 411 (*) 145 - 400 10e3/uL   MCV 73.3 (*) 79.5 - 101.0 fL   MCH 20.1 (*) 25.1 - 34.0 pg   MCHC 27.4 (*) 31.5 - 36.0 g/dL   RBC 3.78  3.70 - 5.45 10e6/uL   RDW 16.6 (*) 11.2 - 14.5 %   lymph# 2.3  0.9 - 3.3 10e3/uL  MONO# 0.6  0.1 - 0.9 10e3/uL   Eosinophils Absolute 0.3  0.0 - 0.5 10e3/uL   Basophils Absolute 0.1  0.0 - 0.1 10e3/uL   NEUT% 57.0  38.4 - 76.8 %   LYMPH% 30.9  14.0 - 49.7 %   MONO% 7.9  0.0 - 14.0 %   EOS% 3.4  0.0 - 7.0 %   BASO% 0.8  0.0 - 2.0 %   Retic % 1.88  0.70 - 2.10 %   Retic Ct Abs 71.06  33.70 - 90.70 10e3/uL   Immature Retic Fract 17.40 (*) 1.60 - 10.00 %  COMPREHENSIVE METABOLIC PANEL (MY11)     Status: None   Collection Time    11/10/13  2:49 PM      Result Value Ref Range   Sodium 141  136 - 145 mEq/L   Potassium 3.7  3.5 - 5.1 mEq/L   Chloride 103  98 - 109 mEq/L   CO2 27  22 - 29 mEq/L   Glucose 116  70 - 140 mg/dl   BUN 13.2  7.0 - 26.0 mg/dL   Creatinine 0.8  0.6 - 1.1 mg/dL   Total Bilirubin 0.74  0.20 - 1.20 mg/dL   Alkaline Phosphatase 76  40 - 150 U/L   AST 19  5 - 34 U/L   ALT 17  0 - 55 U/L   Total Protein 7.1  6.4 - 8.3 g/dL   Albumin 3.8  3.5 - 5.0 g/dL   Calcium 9.1  8.4 - 10.4 mg/dL   Anion Gap 11  3 - 11 mEq/L  LACTATE DEHYDROGENASE (CC13)     Status: None   Collection Time    11/10/13  2:49 PM      Result Value Ref Range   LDH 195  125 - 245 U/L  FERRITIN [facility]     Status: Abnormal   Collection Time    11/10/13  2:49 PM      Result Value Ref Range   Ferritin 4 (*) 9 - 269 ng/ml   ASSESSMENT:  Severe microcytic anemia due to iron deficiency  PLAN #1 Anemia The cause is due to iron deficiency. I  suspect the patient had small GI bleed somewhere that it is not detected by conventional EGD or colonoscopy. She tolerated oral iron poorly. I discussed with her the risk and benefit of iron infusion. For some reason, she was under the impression that her doctor told her iron infusion is "not good for her". I discussed with her the risk and benefit of blood transfusion and she declined as well. She is currently trying the new oral iron formulation. I advised her to take oral iron supplement 30 minutes before food. I will order other additional panel to rule out celiac disease that could be causing her a mild absorption of iron from the diet. I will see her back next week for further assessment.

## 2013-11-13 ENCOUNTER — Telehealth: Payer: Self-pay | Admitting: *Deleted

## 2013-11-13 DIAGNOSIS — Z1211 Encounter for screening for malignant neoplasm of colon: Secondary | ICD-10-CM | POA: Diagnosis not present

## 2013-11-13 NOTE — Telephone Encounter (Signed)
Call from [place] at Dr. Quin Hoop office.  She requested office note faxed to them at fax [phone].  Note faxed.

## 2013-11-17 ENCOUNTER — Telehealth: Payer: Self-pay | Admitting: Hematology and Oncology

## 2013-11-17 ENCOUNTER — Encounter: Payer: Self-pay | Admitting: Hematology and Oncology

## 2013-11-17 ENCOUNTER — Ambulatory Visit (HOSPITAL_BASED_OUTPATIENT_CLINIC_OR_DEPARTMENT_OTHER): Payer: Medicare Other | Admitting: Hematology and Oncology

## 2013-11-17 ENCOUNTER — Telehealth: Payer: Self-pay | Admitting: *Deleted

## 2013-11-17 VITALS — BP 138/68 | HR 100 | Temp 97.6°F | Resp 20 | Ht 63.0 in | Wt 221.3 lb

## 2013-11-17 DIAGNOSIS — D508 Other iron deficiency anemias: Secondary | ICD-10-CM

## 2013-11-17 DIAGNOSIS — K909 Intestinal malabsorption, unspecified: Secondary | ICD-10-CM

## 2013-11-17 DIAGNOSIS — K900 Celiac disease: Secondary | ICD-10-CM | POA: Insufficient documentation

## 2013-11-17 DIAGNOSIS — D649 Anemia, unspecified: Secondary | ICD-10-CM

## 2013-11-17 HISTORY — DX: Celiac disease: K90.0

## 2013-11-17 NOTE — Progress Notes (Signed)
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 I was able to review her CBC from 2010 and noted that she had progressive microcytic anemia. In 2013, she has severe anemia requiring blood transfusion. She underwent EGD and colonoscopy which only showed a large hiatal hernia but no evidence of ulcer or source of bleeding. She had significant pica with excessive chewing of ice. Her last colonoscopy in 2013 was negative  INTERVAL HISTORY: Angelica [name] 73 y.o. female returns for further followup. She complained of profound fatigue. She is taking iron supplement twice a day and is causing her abdominqal discomfort. The patient denies any recent signs or symptoms of bleeding such as spontaneous epistaxis, hematuria or hematochezia. I have reviewed the past medical history, past surgical history, social history and family history with the patient and they are unchanged from previous note.  ALLERGIES:  is allergic to other; fosamax; penicillins; and sulfa antibiotics.  MEDICATIONS:  Current Outpatient Prescriptions  Medication Sig Dispense Refill  . amLODipine (NORVASC) 2.5 MG tablet Take 2.5 mg by mouth daily.      . diazepam (VALIUM) 5 MG tablet Take 5 mg by mouth every 6 (six) hours as needed for anxiety.      . ferrous sulfate 300 (60 FE) MG/5ML syrup Take 5 mLs (300 mg total) by mouth 3 (three) times daily with meals.  150 mL  5  . levothyroxine (SYNTHROID, LEVOTHROID) 125 MCG tablet Take 125 mcg by mouth daily.      . pantoprazole sodium (PROTONIX) 40 mg/20 mL PACK Place 20 mLs (40 mg total) into feeding tube 2 (two) times daily.  60 each  3   No current facility-administered medications for this visit.     REVIEW OF SYSTEMS:   Constitutional: Denies fevers, chills or night sweats Eyes: Denies  blurriness of vision Ears, nose, mouth, throat, and face: Denies mucositis or sore throat Respiratory: Denies cough, dyspnea or wheezes Cardiovascular: Denies palpitation, chest discomfort or lower extremity swelling Gastrointestinal:  Denies nausea, heartburn or change in bowel habits Skin: Denies abnormal skin rashes Lymphatics: Denies new lymphadenopathy or easy bruising Neurological:Denies numbness, tingling or new weaknesses Behavioral/Psych: Mood is stable, no new changes  All other systems were reviewed with the patient and are negative.  PHYSICAL EXAMINATION: ECOG PERFORMANCE STATUS: 1 - Symptomatic but completely ambulatory  Filed Vitals:   11/17/13 0947  BP: 138/68  Pulse: 100  Temp: 97.6 F (36.4 C)  Resp: 20   Filed Weights   11/17/13 0947  Weight: 221 lb 4.8 oz (100.381 kg)    GENERAL:alert, no distress and comfortable. She is morbidly obese and appear pale SKIN: skin color, texture, turgor are normal, no rashes or significant lesions Musculoskeletal:no cyanosis of digits and no clubbing  NEURO: alert & oriented x 3 with fluent speech, no focal motor/sensory deficits  LABORATORY DATA:  I have reviewed the data as listed No results found for this or any previous visit (from the past 48 hour(s)).  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 We discussed some of the risks, benefits, and alternatives of intravenous iron infusions. The patient is symptomatic from anemia and the iron level is critically low. She tolerated oral iron supplement poorly and desires to achieved higher levels of iron faster for adequate hematopoesis. Some of the side-effects to be expected including  risks of infusion reactions, phlebitis, headaches, nausea and fatigue.  The patient is willing to proceed. Patient education material was dispensed.  Goal is to keep ferritin level greater than 50 #2 celiac disease I will consult dietitian for gluten-free diet  I recommend she has a blood work checked with her primary care physician in 3 months and I will see her back in 6 months with repeat blood work. The patient is made aware that she may need further intravenous iron infusion in the future. All questions were answered. The patient knows to call the clinic with any problems, questions or concerns. No barriers to learning was detected.  I spent 25 minutes counseling the patient face to face. The total time spent in the appointment was 40 minutes and more than 50% was on counseling.     Seymour, [place], MD 11/17/2013 10:08 AM

## 2013-11-17 NOTE — Telephone Encounter (Signed)
Gave pt appt for lab and Md for August 2015, emailed michelle regarding IV Iron this Friday

## 2013-11-17 NOTE — Telephone Encounter (Signed)
Per staff message and POF I have scheduled appts. Advised scheduler that the dietitian will see her in chemo  JMW

## 2013-11-17 NOTE — Telephone Encounter (Signed)
Talked to pt and gave her appt for Friday IV Iron and nutrition appt

## 2013-11-19 DIAGNOSIS — C73 Malignant neoplasm of thyroid gland: Secondary | ICD-10-CM | POA: Diagnosis not present

## 2013-11-19 DIAGNOSIS — D509 Iron deficiency anemia, unspecified: Secondary | ICD-10-CM | POA: Diagnosis not present

## 2013-11-19 DIAGNOSIS — E890 Postprocedural hypothyroidism: Secondary | ICD-10-CM | POA: Diagnosis not present

## 2013-11-19 DIAGNOSIS — Z131 Encounter for screening for diabetes mellitus: Secondary | ICD-10-CM | POA: Diagnosis not present

## 2013-11-19 DIAGNOSIS — R7989 Other specified abnormal findings of blood chemistry: Secondary | ICD-10-CM | POA: Diagnosis not present

## 2013-11-20 ENCOUNTER — Telehealth: Payer: Self-pay | Admitting: Hematology and Oncology

## 2013-11-20 NOTE — Telephone Encounter (Signed)
pt called to r/s 2/27 appt to 3/4 due to weather - pt has new d/t

## 2013-11-21 ENCOUNTER — Encounter: Payer: Medicare Other | Admitting: Nutrition

## 2013-11-21 ENCOUNTER — Ambulatory Visit: Payer: Medicare Other

## 2013-11-24 DIAGNOSIS — E890 Postprocedural hypothyroidism: Secondary | ICD-10-CM | POA: Diagnosis not present

## 2013-11-24 DIAGNOSIS — C73 Malignant neoplasm of thyroid gland: Secondary | ICD-10-CM | POA: Diagnosis not present

## 2013-11-24 DIAGNOSIS — Z833 Family history of diabetes mellitus: Secondary | ICD-10-CM | POA: Diagnosis not present

## 2013-11-26 ENCOUNTER — Ambulatory Visit (HOSPITAL_BASED_OUTPATIENT_CLINIC_OR_DEPARTMENT_OTHER): Payer: Medicare Other

## 2013-11-26 ENCOUNTER — Ambulatory Visit: Payer: Medicare Other | Admitting: Nutrition

## 2013-11-26 VITALS — BP 116/67 | HR 88 | Temp 98.6°F | Resp 18

## 2013-11-26 DIAGNOSIS — D509 Iron deficiency anemia, unspecified: Secondary | ICD-10-CM | POA: Diagnosis not present

## 2013-11-26 MED ORDER — SODIUM CHLORIDE 0.9 % IV SOLN
1020.0000 mg | Freq: Once | INTRAVENOUS | Status: AC
  Administered 2013-11-26: 1020 mg via INTRAVENOUS
  Filled 2013-11-26: qty 34

## 2013-11-26 MED ORDER — SODIUM CHLORIDE 0.9 % IV SOLN
Freq: Once | INTRAVENOUS | Status: AC
  Administered 2013-11-26: 14:00:00 via INTRAVENOUS

## 2013-11-26 NOTE — Patient Instructions (Signed)

## 2013-11-26 NOTE — Progress Notes (Signed)
73 year old female diagnosed with iron deficiency anemia.  She is a patient of Dr. Alvy Bimler.  Past medical history includes celiac disease and PICA.  Medications include ferrous sulfate, Synthroid, and Protonix.  Labs were reviewed.  Height: 63 inches. Weight: 221.3 pounds. Usual body weight: 211 pounds January 2014. BMI: 39.21.  Patient referred for diet education secondary to new diagnosis of celiac disease.  Nutrition diagnosis: Food and nutrition related knowledge deficit related to new diagnosis of celiac disease as evidenced by no prior need for nutrition related information.  Intervention: Patient was educated on gluten-free diet.  I have reviewed diet with patient in detail.  I educated her on strategies for utilizing gluten-free flour for cooking and baking.  She was educated on gluten free products she could purchase in the grocery store. Patient educated to read labels to be sure products do not contain ingredients with gluten.  Teach back method used.  Diet education fact sheet provided.   Monitoring, evaluation, goals: Patient will tolerate gluten-free diet for improved symptoms from celiac disease.  Next visit: Patient to contact me with questions.

## 2013-11-26 NOTE — Progress Notes (Signed)
Pt here for first time Feraheme.  Pt tolerated infusion with complaints of occasional nose itching;  Complained one episode of mild tightness in chest but resolved quickly.  VSs stable, O2 sat 98 - 100 on room air.  Pt was monitored very closely during entire iron transfusion.  Pt completed infusion without further complaints.  Pt was monitored for 45 minutes post transfusion.  Pt aware of having lab checked with her primary md in 3 months as instructed by Dr. Alvy Bimler.   Pt understood if symptoms of SOB worsens, pt needs to go to ER for further evaluation. Pt was stable at discharge with niece to home via wheelchair.

## 2014-01-30 DIAGNOSIS — K900 Celiac disease: Secondary | ICD-10-CM | POA: Diagnosis not present

## 2014-01-30 DIAGNOSIS — L259 Unspecified contact dermatitis, unspecified cause: Secondary | ICD-10-CM | POA: Diagnosis not present

## 2014-01-30 DIAGNOSIS — R7309 Other abnormal glucose: Secondary | ICD-10-CM | POA: Diagnosis not present

## 2014-01-30 DIAGNOSIS — J449 Chronic obstructive pulmonary disease, unspecified: Secondary | ICD-10-CM | POA: Diagnosis not present

## 2014-01-30 DIAGNOSIS — I10 Essential (primary) hypertension: Secondary | ICD-10-CM | POA: Diagnosis not present

## 2014-03-09 ENCOUNTER — Other Ambulatory Visit: Payer: Self-pay

## 2014-03-09 DIAGNOSIS — Z1231 Encounter for screening mammogram for malignant neoplasm of breast: Secondary | ICD-10-CM

## 2014-03-17 ENCOUNTER — Ambulatory Visit
Admission: RE | Admit: 2014-03-17 | Discharge: 2014-03-17 | Disposition: A | Discharge status: Home / Self Care | Payer: Medicare Other | Source: Ambulatory Visit

## 2014-03-17 ENCOUNTER — Encounter (INDEPENDENT_AMBULATORY_CARE_PROVIDER_SITE_OTHER): Payer: Self-pay

## 2014-03-17 DIAGNOSIS — Z1231 Encounter for screening mammogram for malignant neoplasm of breast: Secondary | ICD-10-CM | POA: Diagnosis not present

## 2014-03-24 DIAGNOSIS — J449 Chronic obstructive pulmonary disease, unspecified: Secondary | ICD-10-CM | POA: Diagnosis not present

## 2014-04-08 ENCOUNTER — Other Ambulatory Visit: Payer: Self-pay | Admitting: Internal Medicine

## 2014-04-08 ENCOUNTER — Ambulatory Visit
Admission: RE | Admit: 2014-04-08 | Discharge: 2014-04-08 | Disposition: A | Discharge status: Home / Self Care | Payer: Medicare Other | Source: Ambulatory Visit | Attending: Internal Medicine | Admitting: Internal Medicine

## 2014-04-08 DIAGNOSIS — R5381 Other malaise: Secondary | ICD-10-CM

## 2014-04-08 DIAGNOSIS — R5383 Other fatigue: Principal | ICD-10-CM

## 2014-04-08 DIAGNOSIS — H01009 Unspecified blepharitis unspecified eye, unspecified eyelid: Secondary | ICD-10-CM | POA: Diagnosis not present

## 2014-04-08 DIAGNOSIS — R059 Cough, unspecified: Secondary | ICD-10-CM | POA: Diagnosis not present

## 2014-04-08 DIAGNOSIS — R0602 Shortness of breath: Secondary | ICD-10-CM | POA: Diagnosis not present

## 2014-04-08 DIAGNOSIS — R05 Cough: Secondary | ICD-10-CM | POA: Diagnosis not present

## 2014-04-09 ENCOUNTER — Telehealth: Payer: Self-pay | Admitting: *Deleted

## 2014-04-09 ENCOUNTER — Ambulatory Visit (HOSPITAL_COMMUNITY)
Admission: RE | Admit: 2014-04-09 | Discharge: 2014-04-09 | Disposition: A | Discharge status: Home / Self Care | Payer: Medicare Other | Source: Ambulatory Visit | Attending: Hematology and Oncology | Admitting: Hematology and Oncology

## 2014-04-09 ENCOUNTER — Ambulatory Visit (HOSPITAL_BASED_OUTPATIENT_CLINIC_OR_DEPARTMENT_OTHER): Payer: Medicare Other

## 2014-04-09 ENCOUNTER — Other Ambulatory Visit: Payer: Self-pay | Admitting: Hematology and Oncology

## 2014-04-09 VITALS — BP 126/68 | HR 80 | Temp 98.6°F | Resp 20

## 2014-04-09 DIAGNOSIS — D509 Iron deficiency anemia, unspecified: Secondary | ICD-10-CM

## 2014-04-09 DIAGNOSIS — D649 Anemia, unspecified: Secondary | ICD-10-CM | POA: Diagnosis not present

## 2014-04-09 LAB — CBC & DIFF AND RETIC
BASO%: 0.4 % (ref 0.0–2.0)
BASOS ABS: 0 10*3/uL (ref 0.0–0.1)
EOS%: 8 % — ABNORMAL HIGH (ref 0.0–7.0)
Eosinophils Absolute: 0.7 10*3/uL — ABNORMAL HIGH (ref 0.0–0.5)
HCT: 25.7 % — ABNORMAL LOW (ref 34.8–46.6)
HEMOGLOBIN: 6.9 g/dL — AB (ref 11.6–15.9)
IMMATURE RETIC FRACT: 24.1 % — AB (ref 1.60–10.00)
LYMPH#: 2.5 10*3/uL (ref 0.9–3.3)
LYMPH%: 31 % (ref 14.0–49.7)
MCH: 17.9 pg — ABNORMAL LOW (ref 25.1–34.0)
MCHC: 26.8 g/dL — ABNORMAL LOW (ref 31.5–36.0)
MCV: 66.8 fL — AB (ref 79.5–101.0)
MONO#: 0.8 10*3/uL (ref 0.1–0.9)
MONO%: 10.1 % (ref 0.0–14.0)
NEUT#: 4.1 10*3/uL (ref 1.5–6.5)
NEUT%: 50.5 % (ref 38.4–76.8)
Platelets: 436 10*3/uL — ABNORMAL HIGH (ref 145–400)
RBC: 3.85 10*6/uL (ref 3.70–5.45)
RDW: 17.7 % — AB (ref 11.2–14.5)
RETIC CT ABS: 73.92 10*3/uL (ref 33.70–90.70)
Retic %: 1.92 % (ref 0.70–2.10)
WBC: 8.2 10*3/uL (ref 3.9–10.3)

## 2014-04-09 LAB — PREPARE RBC (CROSSMATCH)

## 2014-04-09 LAB — ABO/RH: ABO/RH(D): A POS

## 2014-04-09 MED ORDER — HEPARIN SOD (PORK) LOCK FLUSH 100 UNIT/ML IV SOLN
250.0000 [IU] | Freq: Once | INTRAVENOUS | Status: DC | PRN
  Filled 2014-04-09: qty 5

## 2014-04-09 MED ORDER — SODIUM CHLORIDE 0.9 % IV SOLN
Freq: Once | INTRAVENOUS | Status: AC
  Administered 2014-04-09: 14:00:00 via INTRAVENOUS

## 2014-04-09 MED ORDER — SODIUM CHLORIDE 0.9 % IV SOLN
1020.0000 mg | Freq: Once | INTRAVENOUS | Status: AC
  Administered 2014-04-09: 1020 mg via INTRAVENOUS
  Filled 2014-04-09: qty 34

## 2014-04-09 MED ORDER — SODIUM CHLORIDE 0.9 % IJ SOLN
10.0000 mL | INTRAMUSCULAR | Status: DC | PRN
  Filled 2014-04-09: qty 10

## 2014-04-09 MED ORDER — SODIUM CHLORIDE 0.9 % IV SOLN
Freq: Once | INTRAVENOUS | Status: AC
  Administered 2014-04-09: 15:00:00 via INTRAVENOUS

## 2014-04-09 NOTE — Patient Instructions (Signed)
Ferumoxytol injection What is this medicine? FERUMOXYTOL is an iron complex. Iron is used to make healthy red blood cells, which carry oxygen and nutrients throughout the body. This medicine is used to treat iron deficiency anemia in people with chronic kidney disease. This medicine may be used for other purposes; ask your health care provider or pharmacist if you have questions. COMMON BRAND NAME(S): Feraheme What should I tell my health care provider before I take this medicine? They need to know if you have any of these conditions: -anemia not caused by low iron levels -high levels of iron in the blood -magnetic resonance imaging (MRI) test scheduled -an unusual or allergic reaction to iron, other medicines, foods, dyes, or preservatives -pregnant or trying to get pregnant -breast-feeding How should I use this medicine? This medicine is for injection into a vein. It is given by a health care professional in a hospital or clinic setting. Talk to your pediatrician regarding the use of this medicine in children. Special care may be needed. Overdosage: If you think you've taken too much of this medicine contact a poison control center or emergency room at once. Overdosage: If you think you have taken too much of this medicine contact a poison control center or emergency room at once. NOTE: This medicine is only for you. Do not share this medicine with others. What if I miss a dose? It is important not to miss your dose. Call your doctor or health care professional if you are unable to keep an appointment. What may interact with this medicine? This medicine may interact with the following medications: -other iron products This list may not describe all possible interactions. Give your health care provider a list of all the medicines, herbs, non-prescription drugs, or dietary supplements you use. Also tell them if you smoke, drink alcohol, or use illegal drugs. Some items may interact with your  medicine. What should I watch for while using this medicine? Visit your doctor or healthcare professional regularly. Tell your doctor or healthcare professional if your symptoms do not start to get better or if they get worse. You may need blood work done while you are taking this medicine. You may need to follow a special diet. Talk to your doctor. Foods that contain iron include: whole grains/cereals, dried fruits, beans, or peas, leafy green vegetables, and organ meats (liver, kidney). What side effects may I notice from receiving this medicine? Side effects that you should report to your doctor or health care professional as soon as possible: -allergic reactions like skin rash, itching or hives, swelling of the face, lips, or tongue -breathing problems -changes in blood pressure -feeling faint or lightheaded, falls -fever or chills -flushing, sweating, or hot feelings -swelling of the ankles or feet Side effects that usually do not require medical attention (Report these to your doctor or health care professional if they continue or are bothersome.): -diarrhea -headache -nausea, vomiting -stomach pain This list may not describe all possible side effects. Call your doctor for medical advice about side effects. You may report side effects to FDA at 1-800-FDA-1088. Where should I keep my medicine? This drug is given in a hospital or clinic and will not be stored at home. NOTE: This sheet is a summary. It may not cover all possible information. If you have questions about this medicine, talk to your doctor, pharmacist, or health care provider.  2015, Elsevier/Gold Standard. (2012-04-26 15:23:36)  Blood Transfusion Information WHAT IS A BLOOD TRANSFUSION? A transfusion is the replacement  of blood or some of its parts. Blood is made up of multiple cells which provide different functions.  Red blood cells carry oxygen and are used for blood loss replacement.  White blood cells fight  against infection.  Platelets control bleeding.  Plasma helps clot blood.  Other blood products are available for specialized needs, such as hemophilia or other clotting disorders. BEFORE THE TRANSFUSION  Who gives blood for transfusions?   You may be able to donate blood to be used at a later date on yourself (autologous donation).  Relatives can be asked to donate blood. This is generally not any safer than if you have received blood from a stranger. The same precautions are taken to ensure safety when a relative's blood is donated.  Healthy volunteers who are fully evaluated to make sure their blood is safe. This is blood bank blood. Transfusion therapy is the safest it has ever been in the practice of medicine. Before blood is taken from a donor, a complete history is taken to make sure that person has no history of diseases nor engages in risky social behavior (examples are intravenous drug use or sexual activity with multiple partners). The donor's travel history is screened to minimize risk of transmitting infections, such as malaria. The donated blood is tested for signs of infectious diseases, such as HIV and hepatitis. The blood is then tested to be sure it is compatible with you in order to minimize the chance of a transfusion reaction. If you or a relative donates blood, this is often done in anticipation of surgery and is not appropriate for emergency situations. It takes many days to process the donated blood. RISKS AND COMPLICATIONS Although transfusion therapy is very safe and saves many lives, the main dangers of transfusion include:   Getting an infectious disease.  Developing a transfusion reaction. This is an allergic reaction to something in the blood you were given. Every precaution is taken to prevent this. The decision to have a blood transfusion has been considered carefully by your caregiver before blood is given. Blood is not given unless the benefits outweigh the  risks. AFTER THE TRANSFUSION  Right after receiving a blood transfusion, you will usually feel much better and more energetic. This is especially true if your red blood cells have gotten low (anemic). The transfusion raises the level of the red blood cells which carry oxygen, and this usually causes an energy increase.  The nurse administering the transfusion will monitor you carefully for complications. HOME CARE INSTRUCTIONS  No special instructions are needed after a transfusion. You may find your energy is better. Speak with your caregiver about any limitations on activity for underlying diseases you may have. SEEK MEDICAL CARE IF:   Your condition is not improving after your transfusion.  You develop redness or irritation at the intravenous (IV) site. SEEK IMMEDIATE MEDICAL CARE IF:  Any of the following symptoms occur over the next 12 hours:  Shaking chills.  You have a temperature by mouth above 102 F (38.9 C), not controlled by medicine.  Chest, back, or muscle pain.  People around you feel you are not acting correctly or are confused.  Shortness of breath or difficulty breathing.  Dizziness and fainting.  You get a rash or develop hives.  You have a decrease in urine output.  Your urine turns a dark color or changes to pink, red, or brown. Any of the following symptoms occur over the next 10 days:  You have a temperature  by mouth above 102 F (38.9 C), not controlled by medicine.  Shortness of breath.  Weakness after normal activity.  The white part of the eye turns yellow (jaundice).  You have a decrease in the amount of urine or are urinating less often.  Your urine turns a dark color or changes to pink, red, or brown. Document Released: 09/08/2000 Document Revised: 12/04/2011 Document Reviewed: 04/27/2008 [facility] Patient Information 2015 [place], [place]. This information is not intended to replace advice given to you by your health care provider. Make  sure you discuss any questions you have with your health care provider.

## 2014-04-09 NOTE — Telephone Encounter (Signed)
Angelica [name] reports pt Hgb 6.8 and Dr. Requests pt have transfusion by Dr. Alvy Bimler. Copy of labs received by fax and Dr. Alvy Bimler reviewed.  Dr. Alvy Bimler ordered one unit of blood and IV iron today.  Called pt at 12:05 pm and informed of order for transfusion and iron.  Asked if she can be here by 1 pm for lab for type and cross.  Pt states she will try to find transportation and call us if she cannot make it.  She is going to get ready now.

## 2014-04-10 LAB — TYPE AND SCREEN
ABO/RH(D): A POS
ANTIBODY SCREEN: NEGATIVE
Unit division: 0

## 2014-05-12 DIAGNOSIS — H0019 Chalazion unspecified eye, unspecified eyelid: Secondary | ICD-10-CM | POA: Diagnosis not present

## 2014-05-12 DIAGNOSIS — Z961 Presence of intraocular lens: Secondary | ICD-10-CM | POA: Diagnosis not present

## 2014-05-12 DIAGNOSIS — H04129 Dry eye syndrome of unspecified lacrimal gland: Secondary | ICD-10-CM | POA: Diagnosis not present

## 2014-05-12 DIAGNOSIS — H02839 Dermatochalasis of unspecified eye, unspecified eyelid: Secondary | ICD-10-CM | POA: Diagnosis not present

## 2014-05-18 ENCOUNTER — Ambulatory Visit (HOSPITAL_BASED_OUTPATIENT_CLINIC_OR_DEPARTMENT_OTHER): Payer: Medicare Other | Admitting: Hematology and Oncology

## 2014-05-18 ENCOUNTER — Encounter: Payer: Self-pay | Admitting: Hematology and Oncology

## 2014-05-18 ENCOUNTER — Telehealth: Payer: Self-pay | Admitting: Hematology and Oncology

## 2014-05-18 ENCOUNTER — Other Ambulatory Visit (HOSPITAL_BASED_OUTPATIENT_CLINIC_OR_DEPARTMENT_OTHER): Payer: Medicare Other

## 2014-05-18 VITALS — BP 129/82 | HR 70 | Temp 97.7°F | Resp 18 | Ht 63.0 in | Wt 209.8 lb

## 2014-05-18 DIAGNOSIS — D649 Anemia, unspecified: Secondary | ICD-10-CM

## 2014-05-18 DIAGNOSIS — K900 Celiac disease: Secondary | ICD-10-CM | POA: Diagnosis not present

## 2014-05-18 DIAGNOSIS — D509 Iron deficiency anemia, unspecified: Secondary | ICD-10-CM

## 2014-05-18 LAB — CBC & DIFF AND RETIC
BASO%: 1.2 % (ref 0.0–2.0)
BASOS ABS: 0.1 10*3/uL (ref 0.0–0.1)
EOS%: 8.2 % — ABNORMAL HIGH (ref 0.0–7.0)
Eosinophils Absolute: 0.6 10*3/uL — ABNORMAL HIGH (ref 0.0–0.5)
HCT: 40.9 % (ref 34.8–46.6)
HGB: 12.3 g/dL (ref 11.6–15.9)
IMMATURE RETIC FRACT: 10.2 % — AB (ref 1.60–10.00)
LYMPH#: 2.5 10*3/uL (ref 0.9–3.3)
LYMPH%: 33.2 % (ref 14.0–49.7)
MCH: 25.4 pg (ref 25.1–34.0)
MCHC: 30.1 g/dL — AB (ref 31.5–36.0)
MCV: 84.3 fL (ref 79.5–101.0)
MONO#: 0.7 10*3/uL (ref 0.1–0.9)
MONO%: 8.6 % (ref 0.0–14.0)
NEUT#: 3.7 10*3/uL (ref 1.5–6.5)
NEUT%: 48.8 % (ref 38.4–76.8)
Platelets: 227 10*3/uL (ref 145–400)
RBC: 4.85 10*6/uL (ref 3.70–5.45)
RETIC %: 0.96 % (ref 0.70–2.10)
Retic Ct Abs: 46.56 10*3/uL (ref 33.70–90.70)
WBC: 7.6 10*3/uL (ref 3.9–10.3)

## 2014-05-18 LAB — FERRITIN CHCC: Ferritin: 28 ng/ml (ref 9–269)

## 2014-05-18 NOTE — Telephone Encounter (Signed)
gv and printed appt sched and avs for pt for NOV

## 2014-05-18 NOTE — Assessment & Plan Note (Addendum)
I recommend the patient to appear to gluten free diet if possible. The patient is craving bread and felt that she may not be able to adhere to her diet.

## 2014-05-18 NOTE — Progress Notes (Signed)
[facility] NOTE  [name], [name], MD SUMMARY OF HEMATOLOGIC HISTORY: This patient has been anemic for many years. I was able to review her CBC from 2010 and noted that she had progressive microcytic anemia. In 2013, she has severe anemia requiring blood transfusion. She underwent EGD and colonoscopy which only showed a large hiatal hernia but no evidence of ulcer or source of bleeding. She had significant pica with excessive chewing of ice. Her last colonoscopy in 2013 was negative She received intravenous iron infusion on 11/26/2013 and 04/09/2014. She also received blood transfusion on 04/09/2014 for severe anemia. INTERVAL HISTORY: Angelica [name] 73 y.o. female returns for further followup. The patient denies any recent signs or symptoms of bleeding such as spontaneous epistaxis, hematuria or hematochezia. She had good energy level. Denies any pica.  I have reviewed the past medical history, past surgical history, social history and family history with the patient and they are unchanged from previous note.  ALLERGIES:  is allergic to other; fosamax; penicillins; and sulfa antibiotics.  MEDICATIONS:  Current Outpatient Prescriptions  Medication Sig Dispense Refill  . amLODipine (NORVASC) 2.5 MG tablet Take 2.5 mg by mouth daily.      . diazepam (VALIUM) 5 MG tablet Take 5 mg by mouth every 6 (six) hours as needed for anxiety.      Marland Kitchen levothyroxine (SYNTHROID, LEVOTHROID) 125 MCG tablet Take 125 mcg by mouth daily.      . pantoprazole sodium (PROTONIX) 40 mg/20 mL PACK Place 20 mLs (40 mg total) into feeding tube 2 (two) times daily.  60 each  3   No current facility-administered medications for this visit.     REVIEW OF SYSTEMS:   Constitutional: Denies fevers, chills or night sweats Eyes: Denies blurriness of vision Ears, nose, mouth, throat, and face: Denies mucositis or sore throat Respiratory: Denies cough, dyspnea or  wheezes Cardiovascular: Denies palpitation, chest discomfort or lower extremity swelling Gastrointestinal:  Denies nausea, heartburn or change in bowel habits Skin: Denies abnormal skin rashes Lymphatics: Denies new lymphadenopathy or easy bruising Neurological:Denies numbness, tingling or new weaknesses Behavioral/Psych: Mood is stable, no new changes  All other systems were reviewed with the patient and are negative.  PHYSICAL EXAMINATION: ECOG PERFORMANCE STATUS: 0 - Asymptomatic  Filed Vitals:   05/18/14 1258  BP: 129/82  Pulse: 70  Temp: 97.7 F (36.5 C)  Resp: 18   Filed Weights   05/18/14 1258  Weight: 209 lb 12.8 oz (95.165 kg)    GENERAL:alert, no distress and comfortable SKIN: skin color, texture, turgor are normal, no rashes or significant lesions EYES: normal, Conjunctiva are pink and non-injected, sclera clear Musculoskeletal:no cyanosis of digits and no clubbing  NEURO: alert & oriented x 3 with fluent speech, no focal motor/sensory deficits  LABORATORY DATA:  I have reviewed the data as listed Results for orders placed in visit on 05/18/14 (from the past 48 hour(s))  CBC & DIFF AND RETIC     Status: Abnormal   Collection Time    05/18/14 12:39 PM      Result Value Ref Range   WBC 7.6  3.9 - 10.3 10e3/uL   NEUT# 3.7  1.5 - 6.5 10e3/uL   HGB 12.3  11.6 - 15.9 g/dL   HCT 40.9  34.8 - 46.6 %   Platelets 227  145 - 400 10e3/uL   MCV 84.3  79.5 - 101.0 fL   MCH 25.4  25.1 - 34.0 pg   MCHC  30.1 (*) 31.5 - 36.0 g/dL   RBC 4.85  3.70 - 5.45 10e6/uL   lymph# 2.5  0.9 - 3.3 10e3/uL   MONO# 0.7  0.1 - 0.9 10e3/uL   Eosinophils Absolute 0.6 (*) 0.0 - 0.5 10e3/uL   Basophils Absolute 0.1  0.0 - 0.1 10e3/uL   NEUT% 48.8  38.4 - 76.8 %   LYMPH% 33.2  14.0 - 49.7 %   MONO% 8.6  0.0 - 14.0 %   EOS% 8.2 (*) 0.0 - 7.0 %   BASO% 1.2  0.0 - 2.0 %   Retic % 0.96  0.70 - 2.10 %   Retic Ct Abs 46.56  33.70 - 90.70 10e3/uL   Immature Retic Fract 10.20 (*) 1.60 - 10.00 %     Lab Results  Component Value Date   WBC 7.6 05/18/2014   HGB 12.3 05/18/2014   HCT 40.9 05/18/2014   MCV 84.3 05/18/2014   PLT 227 05/18/2014    ASSESSMENT & PLAN:  Iron deficiency anemia This has resolved with 2 doses of intravenous iron infusion. I recommend repeat blood work in 3 months along with iron studies. I recommend the patient to adhere to celiac disease diet if possible.  Celiac disease I recommend the patient to appear to gluten free diet if possible. The patient is craving bread and felt that she may not be able to adhere to her diet.    All questions were answered. The patient knows to call the clinic with any problems, questions or concerns. No barriers to learning was detected.  I spent 15 minutes counseling the patient face to face. The total time spent in the appointment was 20 minutes and more than 50% was on counseling.     Scalp Level, [place], MD 05/18/2014 1:15 PM

## 2014-05-18 NOTE — Assessment & Plan Note (Signed)
This has resolved with 2 doses of intravenous iron infusion. I recommend repeat blood work in 3 months along with iron studies. I recommend the patient to adhere to celiac disease diet if possible.

## 2014-05-27 DIAGNOSIS — E890 Postprocedural hypothyroidism: Secondary | ICD-10-CM | POA: Diagnosis not present

## 2014-05-27 DIAGNOSIS — C73 Malignant neoplasm of thyroid gland: Secondary | ICD-10-CM | POA: Diagnosis not present

## 2014-05-29 DIAGNOSIS — Z87442 Personal history of urinary calculi: Secondary | ICD-10-CM | POA: Diagnosis not present

## 2014-05-29 DIAGNOSIS — E890 Postprocedural hypothyroidism: Secondary | ICD-10-CM | POA: Diagnosis not present

## 2014-05-29 DIAGNOSIS — Z85850 Personal history of malignant neoplasm of thyroid: Secondary | ICD-10-CM | POA: Diagnosis not present

## 2014-06-02 DIAGNOSIS — Z87442 Personal history of urinary calculi: Secondary | ICD-10-CM | POA: Diagnosis not present

## 2014-06-04 ENCOUNTER — Other Ambulatory Visit (HOSPITAL_COMMUNITY): Payer: Self-pay | Admitting: Respiratory Therapy

## 2014-06-04 DIAGNOSIS — Z0000 Encounter for general adult medical examination without abnormal findings: Secondary | ICD-10-CM | POA: Diagnosis not present

## 2014-06-04 DIAGNOSIS — M810 Age-related osteoporosis without current pathological fracture: Secondary | ICD-10-CM | POA: Diagnosis not present

## 2014-06-04 DIAGNOSIS — R0989 Other specified symptoms and signs involving the circulatory and respiratory systems: Secondary | ICD-10-CM | POA: Diagnosis not present

## 2014-06-04 DIAGNOSIS — Z23 Encounter for immunization: Secondary | ICD-10-CM | POA: Diagnosis not present

## 2014-06-04 DIAGNOSIS — R0609 Other forms of dyspnea: Secondary | ICD-10-CM | POA: Diagnosis not present

## 2014-06-04 DIAGNOSIS — I10 Essential (primary) hypertension: Secondary | ICD-10-CM | POA: Diagnosis not present

## 2014-06-04 DIAGNOSIS — R0600 Dyspnea, unspecified: Secondary | ICD-10-CM

## 2014-06-04 DIAGNOSIS — Z1331 Encounter for screening for depression: Secondary | ICD-10-CM | POA: Diagnosis not present

## 2014-06-04 DIAGNOSIS — K900 Celiac disease: Secondary | ICD-10-CM | POA: Diagnosis not present

## 2014-06-16 ENCOUNTER — Ambulatory Visit (HOSPITAL_COMMUNITY)
Admission: RE | Admit: 2014-06-16 | Discharge: 2014-06-16 | Disposition: A | Discharge status: Home / Self Care | Payer: Medicare Other | Source: Ambulatory Visit | Attending: Internal Medicine | Admitting: Internal Medicine

## 2014-06-16 DIAGNOSIS — Z87891 Personal history of nicotine dependence: Secondary | ICD-10-CM | POA: Insufficient documentation

## 2014-06-16 DIAGNOSIS — J4489 Other specified chronic obstructive pulmonary disease: Secondary | ICD-10-CM | POA: Diagnosis not present

## 2014-06-16 DIAGNOSIS — J449 Chronic obstructive pulmonary disease, unspecified: Secondary | ICD-10-CM | POA: Diagnosis not present

## 2014-06-16 DIAGNOSIS — R0602 Shortness of breath: Secondary | ICD-10-CM | POA: Diagnosis present

## 2014-06-16 LAB — PULMONARY FUNCTION TEST
DL/VA % PRED: 122 %
DL/VA: 5.56 ml/min/mmHg/L
DLCO COR: 18.91 ml/min/mmHg
DLCO cor % pred: 87 %
DLCO unc % pred: 87 %
DLCO unc: 18.91 ml/min/mmHg
FEF 25-75 PRE: 0.72 L/s
FEF 25-75 Post: 1.13 L/sec
FEF2575-%CHANGE-POST: 56 %
FEF2575-%PRED-POST: 73 %
FEF2575-%Pred-Pre: 46 %
FEV1-%Change-Post: 15 %
FEV1-%PRED-POST: 77 %
FEV1-%PRED-PRE: 67 %
FEV1-Post: 1.49 L
FEV1-Pre: 1.29 L
FEV1FVC-%CHANGE-POST: 8 %
FEV1FVC-%Pred-Pre: 84 %
FEV6-%Change-Post: 5 %
FEV6-%PRED-POST: 87 %
FEV6-%Pred-Pre: 82 %
FEV6-PRE: 2.02 L
FEV6-Post: 2.13 L
FEV6FVC-%Change-Post: 0 %
FEV6FVC-%PRED-PRE: 104 %
FEV6FVC-%Pred-Post: 104 %
FVC-%Change-Post: 5 %
FVC-%PRED-PRE: 78 %
FVC-%Pred-Post: 83 %
FVC-POST: 2.14 L
FVC-Pre: 2.03 L
POST FEV6/FVC RATIO: 99 %
Post FEV1/FVC ratio: 69 %
Pre FEV1/FVC ratio: 64 %
Pre FEV6/FVC Ratio: 99 %
RV % PRED: 115 %
RV: 2.53 L
TLC % PRED: 95 %
TLC: 4.52 L

## 2014-06-16 MED ORDER — ALBUTEROL SULFATE (2.5 MG/3ML) 0.083% IN NEBU
2.5000 mg | INHALATION_SOLUTION | Freq: Once | RESPIRATORY_TRACT | Status: AC
  Administered 2014-06-16: 2.5 mg via RESPIRATORY_TRACT

## 2014-07-09 ENCOUNTER — Telehealth: Payer: Self-pay | Admitting: *Deleted

## 2014-07-09 ENCOUNTER — Telehealth: Payer: Self-pay | Admitting: Hematology and Oncology

## 2014-07-09 ENCOUNTER — Other Ambulatory Visit: Payer: Self-pay | Admitting: Hematology and Oncology

## 2014-07-09 NOTE — Telephone Encounter (Signed)
S/w pt confirmed labs for 10/16 per 10/15 POF..... Angelica [name]

## 2014-07-09 NOTE — Telephone Encounter (Signed)
Pt left VM states she feels like her "blood is low."  She asks if it can be checked?  Called pt back for more detail and she was not available.  Left her VM to call nurse back.

## 2014-07-09 NOTE — Telephone Encounter (Signed)
Informed pt will make lab appt for tomorrow. She requests around lunch time tomorrow if possible. Informed her scheduler will call her back w/ the time of appt.  She verbalized understanding.

## 2014-07-09 NOTE — Telephone Encounter (Signed)
Please make her a lab appt tomorrow

## 2014-07-10 ENCOUNTER — Other Ambulatory Visit (HOSPITAL_BASED_OUTPATIENT_CLINIC_OR_DEPARTMENT_OTHER): Payer: Medicare Other

## 2014-07-10 DIAGNOSIS — D509 Iron deficiency anemia, unspecified: Secondary | ICD-10-CM

## 2014-07-10 DIAGNOSIS — D649 Anemia, unspecified: Secondary | ICD-10-CM | POA: Diagnosis not present

## 2014-07-10 LAB — CBC & DIFF AND RETIC
BASO%: 1.1 % (ref 0.0–2.0)
Basophils Absolute: 0.1 10*3/uL (ref 0.0–0.1)
EOS%: 4.2 % (ref 0.0–7.0)
Eosinophils Absolute: 0.4 10*3/uL (ref 0.0–0.5)
HCT: 33.8 % — ABNORMAL LOW (ref 34.8–46.6)
HGB: 10.4 g/dL — ABNORMAL LOW (ref 11.6–15.9)
LYMPH%: 30.9 % (ref 14.0–49.7)
MCH: 24.6 pg — ABNORMAL LOW (ref 25.1–34.0)
MCHC: 30.9 g/dL — ABNORMAL LOW (ref 31.5–36.0)
MCV: 79.7 fL (ref 79.5–101.0)
MONO#: 0.9 10*3/uL (ref 0.1–0.9)
MONO%: 9.7 % (ref 0.0–14.0)
NEUT#: 4.9 10*3/uL (ref 1.5–6.5)
NEUT%: 54.1 % (ref 38.4–76.8)
Platelets: 429 10*3/uL — ABNORMAL HIGH (ref 145–400)
RBC: 4.24 10*6/uL (ref 3.70–5.45)
RDW: 19.6 % — ABNORMAL HIGH (ref 11.2–14.5)
WBC: 9 10*3/uL (ref 3.9–10.3)
lymph#: 2.8 10*3/uL (ref 0.9–3.3)
nRBC: 0 % (ref 0–0)

## 2014-07-10 LAB — RETICULOCYTES (CHCC)
ABS Retic: 63.5 10*3/uL (ref 19.0–186.0)
RBC.: 4.23 MIL/uL (ref 3.87–5.11)
Retic Ct Pct: 1.5 % (ref 0.4–2.3)

## 2014-07-10 LAB — FERRITIN CHCC: FERRITIN: 6 ng/mL — AB (ref 9–269)

## 2014-07-10 LAB — IRON AND TIBC CHCC
%SAT: 4 % — ABNORMAL LOW (ref 21–57)
Iron: 16 ug/dL — ABNORMAL LOW (ref 41–142)
TIBC: 425 ug/dL (ref 236–444)
UIBC: 409 ug/dL — ABNORMAL HIGH (ref 120–384)

## 2014-07-18 ENCOUNTER — Encounter: Payer: Self-pay | Admitting: *Deleted

## 2014-08-05 ENCOUNTER — Other Ambulatory Visit: Payer: Self-pay | Admitting: Hematology and Oncology

## 2014-08-17 ENCOUNTER — Other Ambulatory Visit: Payer: Self-pay | Admitting: Hematology and Oncology

## 2014-08-17 DIAGNOSIS — D509 Iron deficiency anemia, unspecified: Secondary | ICD-10-CM

## 2014-08-18 ENCOUNTER — Other Ambulatory Visit: Payer: Self-pay | Admitting: Hematology and Oncology

## 2014-08-18 ENCOUNTER — Other Ambulatory Visit: Payer: Self-pay | Admitting: *Deleted

## 2014-08-18 ENCOUNTER — Ambulatory Visit (HOSPITAL_COMMUNITY)
Admission: RE | Admit: 2014-08-18 | Discharge: 2014-08-18 | Disposition: A | Discharge status: Home / Self Care | Payer: Medicare Other | Source: Ambulatory Visit | Attending: Hematology and Oncology | Admitting: Hematology and Oncology

## 2014-08-18 ENCOUNTER — Other Ambulatory Visit (HOSPITAL_BASED_OUTPATIENT_CLINIC_OR_DEPARTMENT_OTHER): Payer: Medicare Other

## 2014-08-18 DIAGNOSIS — D509 Iron deficiency anemia, unspecified: Secondary | ICD-10-CM

## 2014-08-18 LAB — CBC & DIFF AND RETIC
BASO%: 0.5 % (ref 0.0–2.0)
Basophils Absolute: 0 10*3/uL (ref 0.0–0.1)
EOS ABS: 0.3 10*3/uL (ref 0.0–0.5)
EOS%: 4.4 % (ref 0.0–7.0)
HEMATOCRIT: 25.9 % — AB (ref 34.8–46.6)
HGB: 7 g/dL — ABNORMAL LOW (ref 11.6–15.9)
IMMATURE RETIC FRACT: 21.2 % — AB (ref 1.60–10.00)
LYMPH%: 24.5 % (ref 14.0–49.7)
MCH: 19.8 pg — ABNORMAL LOW (ref 25.1–34.0)
MCHC: 27 g/dL — ABNORMAL LOW (ref 31.5–36.0)
MCV: 73.2 fL — AB (ref 79.5–101.0)
MONO#: 0.6 10*3/uL (ref 0.1–0.9)
MONO%: 8.1 % (ref 0.0–14.0)
NEUT%: 62.5 % (ref 38.4–76.8)
NEUTROS ABS: 4.6 10*3/uL (ref 1.5–6.5)
PLATELETS: 458 10*3/uL — AB (ref 145–400)
RBC: 3.54 10*6/uL — ABNORMAL LOW (ref 3.70–5.45)
RDW: 18.5 % — ABNORMAL HIGH (ref 11.2–14.5)
Retic %: 2.57 % — ABNORMAL HIGH (ref 0.70–2.10)
Retic Ct Abs: 90.98 10*3/uL — ABNORMAL HIGH (ref 33.70–90.70)
WBC: 7.4 10*3/uL (ref 3.9–10.3)
lymph#: 1.8 10*3/uL (ref 0.9–3.3)
nRBC: 0 % (ref 0–0)

## 2014-08-18 LAB — PREPARE RBC (CROSSMATCH)

## 2014-08-18 LAB — FERRITIN CHCC: Ferritin: <4 ng/ml — ABNORMAL LOW (ref 9–269)

## 2014-08-18 LAB — HOLD TUBE, BLOOD BANK

## 2014-08-18 NOTE — Progress Notes (Signed)
S/w pt in lobby and gave results of CBC.  Informed her of need for 2 units of PRBCs transfusion tomorrow at [facility] at 9 am.  Gave pt a map of campus and directions to get to [facility].  Informed her she will also need IV Iron and we will have to call her with that appt.. She verbalized understanding.

## 2014-08-19 ENCOUNTER — Telehealth (HOSPITAL_COMMUNITY): Payer: Self-pay | Admitting: Hematology

## 2014-08-19 ENCOUNTER — Non-Acute Institutional Stay (HOSPITAL_COMMUNITY)
Admission: AD | Admit: 2014-08-19 | Discharge: 2014-08-19 | Disposition: A | Discharge status: Home / Self Care | Payer: Medicare Other | Source: Ambulatory Visit | Attending: Hematology and Oncology | Admitting: Hematology and Oncology

## 2014-08-19 DIAGNOSIS — D509 Iron deficiency anemia, unspecified: Secondary | ICD-10-CM | POA: Diagnosis not present

## 2014-08-19 LAB — TYPE AND SCREEN
ABO/RH(D): A POS
ANTIBODY SCREEN: NEGATIVE
Unit division: 0
Unit division: 0

## 2014-08-19 LAB — PREPARE RBC (CROSSMATCH)

## 2014-08-19 MED ORDER — SODIUM CHLORIDE 0.9 % IV SOLN
250.0000 mL | Freq: Once | INTRAVENOUS | Status: AC
  Administered 2014-08-19: 250 mL via INTRAVENOUS

## 2014-08-19 MED ORDER — SODIUM CHLORIDE 0.9 % IV SOLN
Freq: Once | INTRAVENOUS | Status: AC
  Administered 2014-08-19: 13:00:00 via INTRAVENOUS

## 2014-08-19 MED ORDER — SODIUM CHLORIDE 0.9 % IJ SOLN: 10.0000 mL | INTRAMUSCULAR | Status: DC | PRN

## 2014-08-19 MED ORDER — HEPARIN SOD (PORK) LOCK FLUSH 100 UNIT/ML IV SOLN: 500.0000 [IU] | Freq: Every day | INTRAVENOUS | Status: DC | PRN

## 2014-08-19 MED ORDER — SODIUM CHLORIDE 0.9 % IJ SOLN: 3.0000 mL | INTRAMUSCULAR | Status: DC | PRN

## 2014-08-19 MED ORDER — HEPARIN SOD (PORK) LOCK FLUSH 100 UNIT/ML IV SOLN: 250.0000 [IU] | INTRAVENOUS | Status: DC | PRN

## 2014-08-19 NOTE — Procedures (Signed)
  PCP: [name], MD   Associated Diagnosis: Anemia, iron deficiency  Procedure Note: Transfusion of 2 units PRBCs   Condition During Procedure: Pt tolerated well   Condition at Discharge: No complications noted   Clovia Cuff, [facility]

## 2014-08-19 NOTE — Telephone Encounter (Signed)
Opened in error

## 2014-08-19 NOTE — Progress Notes (Signed)
Patient arrived to [facility] without blue blood bank band.  Adult blood administration order set reordered because patient needs to be typed and crossed again.  Patient re-educated the importance of keeping blood band on until after administration.  Labs will be redrawn.  This RN will continue to monitor.

## 2014-08-19 NOTE — H&P (Signed)
The patient is here for transfusion only

## 2014-08-20 LAB — TYPE AND SCREEN
ABO/RH(D): A POS
Antibody Screen: NEGATIVE
UNIT DIVISION: 0
Unit division: 0

## 2014-08-24 ENCOUNTER — Ambulatory Visit (HOSPITAL_BASED_OUTPATIENT_CLINIC_OR_DEPARTMENT_OTHER): Payer: Medicare Other | Admitting: Hematology and Oncology

## 2014-08-24 ENCOUNTER — Telehealth: Payer: Self-pay | Admitting: Hematology and Oncology

## 2014-08-24 ENCOUNTER — Ambulatory Visit (HOSPITAL_BASED_OUTPATIENT_CLINIC_OR_DEPARTMENT_OTHER): Payer: Medicare Other

## 2014-08-24 VITALS — BP 126/73 | HR 73 | Temp 97.3°F | Resp 18 | Ht 63.0 in | Wt 212.9 lb

## 2014-08-24 DIAGNOSIS — R7989 Other specified abnormal findings of blood chemistry: Secondary | ICD-10-CM | POA: Diagnosis not present

## 2014-08-24 DIAGNOSIS — D509 Iron deficiency anemia, unspecified: Secondary | ICD-10-CM

## 2014-08-24 DIAGNOSIS — D75838 Other thrombocytosis: Secondary | ICD-10-CM | POA: Insufficient documentation

## 2014-08-24 MED ORDER — SODIUM CHLORIDE 0.9 % IV SOLN
1020.0000 mg | Freq: Once | INTRAVENOUS | Status: AC
  Administered 2014-08-24: 1020 mg via INTRAVENOUS
  Filled 2014-08-24: qty 34

## 2014-08-24 MED ORDER — SODIUM CHLORIDE 0.9 % IV SOLN
Freq: Once | INTRAVENOUS | Status: AC
  Administered 2014-08-24: 16:00:00 via INTRAVENOUS

## 2014-08-24 NOTE — Assessment & Plan Note (Signed)
This is multifactorial, likely due to ongoing GI bleed and malabsorption from celiac disease. Last week, she needed to get blood transfusion due to severe, symptomatic anemia. I will attempt to contact her gastroenterologist to see if repeat endoscopy would be appropriate. She complained of intermittent melena and I suspect she have ongoing GI bleed that warrant a repeat endoscopy at this point. I will repeat bloodwork again at the end of the month and continue IV iron infusion as needed to keep her ferritin greater than 50. I recommend the patient to adhere to celiac disease diet if possible.

## 2014-08-24 NOTE — Progress Notes (Signed)
[place] NOTE  Angelica [name], [name], MD SUMMARY OF HEMATOLOGIC HISTORY: This patient has been anemic for many years. I was able to review her CBC from 2010 and noted that she had progressive microcytic anemia. In 2013, she has severe anemia requiring blood transfusion. She underwent EGD and colonoscopy in 2013 which only showed a large hiatal hernia but no evidence of ulcer or source of bleeding. She had significant pica with excessive chewing of ice. Her last colonoscopy in 2013 was negative She received intravenous iron infusion on 11/26/2013 and 04/09/2014. She also received blood transfusion on 04/09/2014 and 08/19/14 for severe anemia. INTERVAL HISTORY: Angelica [name] 73 y.o. female returns for further follow-up. She complained of fatigue. She had excessive pica we chewing of ice. She noted intermittent melena but no hematochezia. The patient denies any recent signs or symptoms of bleeding such as spontaneous epistaxis, hematuria or hematochezia. She denies reflux symptoms. She denies taking over-the-counter nonsteroidal anti-inflammatory medications.  I have reviewed the past medical history, past surgical history, social history and family history with the patient and they are unchanged from previous note.  ALLERGIES:  is allergic to other; fosamax; penicillins; and sulfa antibiotics.  MEDICATIONS:  Current Outpatient Prescriptions  Medication Sig Dispense Refill  . amLODipine (NORVASC) 2.5 MG tablet Take 2.5 mg by mouth daily.    . diazepam (VALIUM) 5 MG tablet Take 5 mg by mouth every 6 (six) hours as needed for anxiety.    Marland Kitchen levothyroxine (SYNTHROID, LEVOTHROID) 125 MCG tablet Take 125 mcg by mouth daily.    . pantoprazole sodium (PROTONIX) 40 mg/20 mL PACK Place 20 mLs (40 mg total) into feeding tube 2 (two) times daily. 60 each 3   No current facility-administered medications for this visit.     REVIEW OF SYSTEMS:   Constitutional:  Denies fevers, chills or night sweats Eyes: Denies blurriness of vision Ears, nose, mouth, throat, and face: Denies mucositis or sore throat Respiratory: Denies cough, dyspnea or wheezes Cardiovascular: Denies palpitation, chest discomfort or lower extremity swelling Gastrointestinal:  Denies nausea, heartburn or change in bowel habits Skin: Denies abnormal skin rashes Lymphatics: Denies new lymphadenopathy or easy bruising Neurological:Denies numbness, tingling or new weaknesses Behavioral/Psych: Mood is stable, no new changes  All other systems were reviewed with the patient and are negative.  PHYSICAL EXAMINATION: ECOG PERFORMANCE STATUS: 0 - Asymptomatic  Filed Vitals:   08/24/14 1304  BP: 126/73  Pulse: 73  Temp: 97.3 F (36.3 C)  Resp: 18   Filed Weights   08/24/14 1249 08/24/14 1304  Weight: 212 lb 14.4 oz (96.571 kg) 212 lb 14.4 oz (96.571 kg)    GENERAL:alert, no distress and comfortable SKIN: skin color is pale, texture, turgor are normal, no rashes or significant lesions EYES: normal, Conjunctiva are pale and non-injected, sclera clear OROPHARYNX:no exudate, no erythema and lips, buccal mucosa, and tongue normal  NECK: supple, thyroid normal size, non-tender, without nodularity LYMPH:  no palpable lymphadenopathy in the cervical, axillary or inguinal LUNGS: clear to auscultation and percussion with normal breathing effort HEART: regular rate & rhythm and no murmurs and no lower extremity edema ABDOMEN:abdomen soft, non-tender and normal bowel sounds. Mild tenderness on deep palpation on the right upper quadrant with no rebound or guarding Musculoskeletal:no cyanosis of digits and no clubbing  NEURO: alert & oriented x 3 with fluent speech, no focal motor/sensory deficits  LABORATORY DATA:  I have reviewed the data as listed No results found for this or  any previous visit (from the past 48 hour(s)).  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 Last week, she needed to get blood transfusion due to severe, symptomatic anemia. I will attempt to contact her gastroenterologist to see if repeat endoscopy would be appropriate. She complained of intermittent melena and I suspect she have ongoing GI bleed that warrant a repeat endoscopy at this point. I will repeat bloodwork again at the end of the month and continue IV iron infusion as needed to keep her ferritin greater than 50. I recommend the patient to adhere to celiac disease diet if possible.    Reactive thrombocytosis This is likely related to severe iron deficiency anemia. I will recheck again at the end of next month.      All questions were answered. The patient knows to call the clinic with any problems, questions or concerns. No barriers to learning was detected.  I spent 30 minutes counseling the patient face to face. The total time spent in the appointment was 40 minutes and more than 50% was on counseling.     [place], Davis, MD 08/24/2014 3:02 PM

## 2014-08-24 NOTE — Patient Instructions (Signed)

## 2014-08-24 NOTE — Assessment & Plan Note (Signed)
This is likely related to severe iron deficiency anemia. I will recheck again at the end of next month.

## 2014-08-24 NOTE — Telephone Encounter (Signed)
Gave avs & cal for Dec.

## 2014-08-27 ENCOUNTER — Telehealth: Payer: Self-pay | Admitting: Hematology and Oncology

## 2014-08-27 ENCOUNTER — Other Ambulatory Visit: Payer: Self-pay | Admitting: Hematology and Oncology

## 2014-08-27 DIAGNOSIS — D509 Iron deficiency anemia, unspecified: Secondary | ICD-10-CM

## 2014-08-27 NOTE — Telephone Encounter (Signed)
s.w. pt sister and advised on todays appt for Dr.  Oletta Lamas @ 3pm..Marland KitchenMarland KitchenMarland Kitchenpt ok and aware

## 2014-08-28 ENCOUNTER — Telehealth: Payer: Self-pay | Admitting: Hematology and Oncology

## 2014-08-28 NOTE — Telephone Encounter (Signed)
Faxed pt medical records to [phone]

## 2014-09-08 DIAGNOSIS — D509 Iron deficiency anemia, unspecified: Secondary | ICD-10-CM | POA: Diagnosis not present

## 2014-09-10 ENCOUNTER — Other Ambulatory Visit: Payer: Self-pay | Admitting: Gastroenterology

## 2014-09-10 DIAGNOSIS — K3189 Other diseases of stomach and duodenum: Secondary | ICD-10-CM | POA: Diagnosis not present

## 2014-09-10 DIAGNOSIS — D509 Iron deficiency anemia, unspecified: Secondary | ICD-10-CM | POA: Diagnosis not present

## 2014-09-10 DIAGNOSIS — K449 Diaphragmatic hernia without obstruction or gangrene: Secondary | ICD-10-CM | POA: Diagnosis not present

## 2014-09-21 ENCOUNTER — Other Ambulatory Visit (HOSPITAL_BASED_OUTPATIENT_CLINIC_OR_DEPARTMENT_OTHER): Payer: Medicare Other

## 2014-09-21 ENCOUNTER — Encounter: Payer: Self-pay | Admitting: Hematology and Oncology

## 2014-09-21 ENCOUNTER — Telehealth: Payer: Self-pay | Admitting: Hematology and Oncology

## 2014-09-21 ENCOUNTER — Ambulatory Visit (HOSPITAL_BASED_OUTPATIENT_CLINIC_OR_DEPARTMENT_OTHER): Payer: Medicare Other | Admitting: Hematology and Oncology

## 2014-09-21 ENCOUNTER — Telehealth: Payer: Self-pay | Admitting: *Deleted

## 2014-09-21 VITALS — BP 154/74 | HR 67 | Temp 97.5°F | Resp 18 | Ht 63.0 in | Wt 216.3 lb

## 2014-09-21 DIAGNOSIS — D509 Iron deficiency anemia, unspecified: Secondary | ICD-10-CM | POA: Diagnosis not present

## 2014-09-21 DIAGNOSIS — R7989 Other specified abnormal findings of blood chemistry: Secondary | ICD-10-CM

## 2014-09-21 LAB — CBC & DIFF AND RETIC
BASO%: 0.5 % (ref 0.0–2.0)
BASOS ABS: 0 10*3/uL (ref 0.0–0.1)
EOS%: 4.2 % (ref 0.0–7.0)
Eosinophils Absolute: 0.3 10*3/uL (ref 0.0–0.5)
HCT: 37.3 % (ref 34.8–46.6)
HEMOGLOBIN: 11.2 g/dL — AB (ref 11.6–15.9)
Immature Retic Fract: 16 % — ABNORMAL HIGH (ref 1.60–10.00)
LYMPH%: 35.1 % (ref 14.0–49.7)
MCH: 26 pg (ref 25.1–34.0)
MCHC: 30 g/dL — ABNORMAL LOW (ref 31.5–36.0)
MCV: 86.5 fL (ref 79.5–101.0)
MONO#: 0.6 10*3/uL (ref 0.1–0.9)
MONO%: 10.7 % (ref 0.0–14.0)
NEUT#: 3 10*3/uL (ref 1.5–6.5)
NEUT%: 49.5 % (ref 38.4–76.8)
Platelets: 268 10*3/uL (ref 145–400)
RBC: 4.31 10*6/uL (ref 3.70–5.45)
RDW: 25.6 % — AB (ref 11.2–14.5)
RETIC %: 1.67 % (ref 0.70–2.10)
Retic Ct Abs: 71.98 10*3/uL (ref 33.70–90.70)
WBC: 6 10*3/uL (ref 3.9–10.3)
lymph#: 2.1 10*3/uL (ref 0.9–3.3)
nRBC: 0 % (ref 0–0)

## 2014-09-21 LAB — FERRITIN CHCC: Ferritin: 72 ng/ml (ref 9–269)

## 2014-09-21 LAB — IRON AND TIBC CHCC
%SAT: 12 % — AB (ref 21–57)
IRON: 40 ug/dL — AB (ref 41–142)
TIBC: 334 ug/dL (ref 236–444)
UIBC: 294 ug/dL (ref 120–384)

## 2014-09-21 NOTE — Telephone Encounter (Signed)
Notified of results below 

## 2014-09-21 NOTE — Telephone Encounter (Signed)
-----   Message from [name], MD sent at 09/21/2014  1:40 PM EST ----- Regarding: iron studies Please let her know ferritin/iron level is good. Checking cbc next month as scheduled. ----- Message -----    From: [facility]    Sent: 09/21/2014  12:42 PM      To: [name], MD

## 2014-09-21 NOTE — Telephone Encounter (Signed)
Per staff message and POF I have scheduled appts. Advised scheduler of appts. JMW  

## 2014-09-21 NOTE — Telephone Encounter (Signed)
Gave avs & cal for Jan-March.

## 2014-09-22 LAB — HOLD TUBE, BLOOD BANK

## 2014-09-22 NOTE — Progress Notes (Signed)
[place] NOTE  Angelica [name], [name], MD SUMMARY OF HEMATOLOGIC HISTORY:  This patient has been anemic for many years. I was able to review her CBC from 2010 and noted that she had progressive microcytic anemia. In 2013, she has severe anemia requiring blood transfusion. She underwent EGD and colonoscopy in 2013 which only showed a large hiatal hernia but no evidence of ulcer or source of bleeding. She had significant pica with excessive chewing of ice. Her last colonoscopy in 2013 was negative She received intravenous iron infusion on 11/26/2013, 04/09/2014 and 08/24/14. She also received blood transfusion on 04/09/2014 and 08/19/14 for severe anemia. INTERVAL HISTORY: Angelica [name] 73 y.o. female returns for further follow-up. She denies pica. She had recent EGD and colonoscopy. The patient denies any recent signs or symptoms of bleeding such as spontaneous epistaxis, hematuria or hematochezia.   I have reviewed the past medical history, past surgical history, social history and family history with the patient and they are unchanged from previous note.  ALLERGIES:  is allergic to other; fosamax; penicillins; and sulfa antibiotics.  MEDICATIONS:  Current Outpatient Prescriptions  Medication Sig Dispense Refill  . amLODipine (NORVASC) 2.5 MG tablet Take 2.5 mg by mouth daily.    . diazepam (VALIUM) 5 MG tablet Take 5 mg by mouth every 6 (six) hours as needed for anxiety.    Marland Kitchen levothyroxine (SYNTHROID, LEVOTHROID) 125 MCG tablet Take 125 mcg by mouth daily.    . pantoprazole sodium (PROTONIX) 40 mg/20 mL PACK Place 20 mLs (40 mg total) into feeding tube 2 (two) times daily. 60 each 3   No current facility-administered medications for this visit.     REVIEW OF SYSTEMS:   Constitutional: Denies fevers, chills or night sweats Eyes: Denies blurriness of vision Ears, nose, mouth, throat, and face: Denies mucositis or sore throat Respiratory: Denies  cough, dyspnea or wheezes Cardiovascular: Denies palpitation, chest discomfort or lower extremity swelling Gastrointestinal:  Denies nausea, heartburn or change in bowel habits Skin: Denies abnormal skin rashes Lymphatics: Denies new lymphadenopathy or easy bruising Neurological:Denies numbness, tingling or new weaknesses Behavioral/Psych: Mood is stable, no new changes  All other systems were reviewed with the patient and are negative.  PHYSICAL EXAMINATION: ECOG PERFORMANCE STATUS: 0 - Asymptomatic  Filed Vitals:   09/21/14 1338  BP: 154/74  Pulse: 67  Temp: 97.5 F (36.4 C)  Resp: 18   Filed Weights   09/21/14 1315 09/21/14 1338  Weight: 216 lb 4.8 oz (98.113 kg) 216 lb 4.8 oz (98.113 kg)    GENERAL:alert, no distress and comfortable SKIN: skin color, texture, turgor are normal, no rashes or significant lesions EYES: normal, Conjunctiva are pink and non-injected, sclera clear Musculoskeletal:no cyanosis of digits and no clubbing  NEURO: alert & oriented x 3 with fluent speech, no focal motor/sensory deficits  LABORATORY DATA:  I have reviewed the data as listed Results for orders placed or performed in visit on 09/21/14 (from the past 48 hour(s))  CBC & Diff and Retic     Status: Abnormal   Collection Time: 09/21/14 12:28 PM  Result Value Ref Range   WBC 6.0 3.9 - 10.3 10e3/uL   NEUT# 3.0 1.5 - 6.5 10e3/uL   HGB 11.2 (L) 11.6 - 15.9 g/dL   HCT 37.3 34.8 - 46.6 %   Platelets 268 145 - 400 10e3/uL   MCV 86.5 79.5 - 101.0 fL   MCH 26.0 25.1 - 34.0 pg   MCHC 30.0 (L) 31.5 -  36.0 g/dL   RBC 4.31 3.70 - 5.45 10e6/uL   RDW 25.6 (H) 11.2 - 14.5 %   lymph# 2.1 0.9 - 3.3 10e3/uL   MONO# 0.6 0.1 - 0.9 10e3/uL   Eosinophils Absolute 0.3 0.0 - 0.5 10e3/uL   Basophils Absolute 0.0 0.0 - 0.1 10e3/uL   NEUT% 49.5 38.4 - 76.8 %   LYMPH% 35.1 14.0 - 49.7 %   MONO% 10.7 0.0 - 14.0 %   EOS% 4.2 0.0 - 7.0 %   BASO% 0.5 0.0 - 2.0 %   nRBC 0 0 - 0 %   Retic % 1.67 0.70 - 2.10 %    Retic Ct Abs 71.98 33.70 - 90.70 10e3/uL   Immature Retic Fract 16.00 (H) 1.60 - 10.00 %  Iron and TIBC     Status: Abnormal   Collection Time: 09/21/14 12:28 PM  Result Value Ref Range   Iron 40 (L) 41 - 142 ug/dL   TIBC 334 236 - 444 ug/dL   UIBC 294 120 - 384 ug/dL   %SAT 12 (L) 21 - 57 %  Ferritin     Status: None   Collection Time: 09/21/14 12:28 PM  Result Value Ref Range   Ferritin 72 9 - 269 ng/ml  Hold Tube, Blood Bank     Status: None   Collection Time: 09/21/14 12:28 PM  Result Value Ref Range   Hold Tube, Blood Bank Blood Bank Order Cancelled     Lab Results  Component Value Date   WBC 6.0 09/21/2014   HGB 11.2* 09/21/2014   HCT 37.3 09/21/2014   MCV 86.5 09/21/2014   PLT 268 09/21/2014    ASSESSMENT & PLAN:  Iron deficiency anemia This is multifactorial, likely due to ongoing GI bleed  She had EGD and colonoscopy, reports not available. Apparently, according to the patient, she was told the results were good. Biopsy did not show any evidence of celiac disease. She is still mildly anemic but iron studies are adequate. I recommend she returns on a monthly basis for repeat blood work and to continue close monitoring of her iron level. The plan would be to get her iron level above 100 with IV iron and to avoid blood transfusion if needed. She agreed with the plan.   All questions were answered. The patient knows to call the clinic with any problems, questions or concerns. No barriers to learning was detected.  I spent 15 minutes counseling the patient face to face. The total time spent in the appointment was 20 minutes and more than 50% was on counseling.     [facility], [place], MD 09/22/2014 7:57 PM

## 2014-09-22 NOTE — Assessment & Plan Note (Signed)
This is multifactorial, likely due to ongoing GI bleed  She had EGD and colonoscopy, reports not available. Apparently, according to the patient, she was told the results were good. Biopsy did not show any evidence of celiac disease. She is still mildly anemic but iron studies are adequate. I recommend she returns on a monthly basis for repeat blood work and to continue close monitoring of her iron level. The plan would be to get her iron level above 100 with IV iron and to avoid blood transfusion if needed. She agreed with the plan.

## 2014-10-19 ENCOUNTER — Other Ambulatory Visit: Payer: Medicare Other

## 2014-10-19 ENCOUNTER — Telehealth: Payer: Self-pay | Admitting: Hematology and Oncology

## 2014-10-19 NOTE — Telephone Encounter (Signed)
pt called to r/s lab...done...pt ok and aware of new d.t

## 2014-10-21 DIAGNOSIS — D509 Iron deficiency anemia, unspecified: Secondary | ICD-10-CM | POA: Diagnosis not present

## 2014-10-22 ENCOUNTER — Telehealth: Payer: Self-pay | Admitting: *Deleted

## 2014-10-22 ENCOUNTER — Other Ambulatory Visit: Payer: Self-pay | Admitting: Hematology and Oncology

## 2014-10-22 ENCOUNTER — Other Ambulatory Visit (HOSPITAL_BASED_OUTPATIENT_CLINIC_OR_DEPARTMENT_OTHER): Payer: Medicare Other

## 2014-10-22 DIAGNOSIS — D509 Iron deficiency anemia, unspecified: Secondary | ICD-10-CM

## 2014-10-22 LAB — CBC & DIFF AND RETIC
BASO%: 0.3 % (ref 0.0–2.0)
Basophils Absolute: 0 10*3/uL (ref 0.0–0.1)
EOS ABS: 0.2 10*3/uL (ref 0.0–0.5)
EOS%: 4 % (ref 0.0–7.0)
HCT: 31.6 % — ABNORMAL LOW (ref 34.8–46.6)
HEMOGLOBIN: 9.2 g/dL — AB (ref 11.6–15.9)
Immature Retic Fract: 24 % — ABNORMAL HIGH (ref 1.60–10.00)
LYMPH%: 33 % (ref 14.0–49.7)
MCH: 24.7 pg — ABNORMAL LOW (ref 25.1–34.0)
MCHC: 29.1 g/dL — ABNORMAL LOW (ref 31.5–36.0)
MCV: 84.9 fL (ref 79.5–101.0)
MONO#: 0.5 10*3/uL (ref 0.1–0.9)
MONO%: 8.7 % (ref 0.0–14.0)
NEUT#: 3.3 10*3/uL (ref 1.5–6.5)
NEUT%: 54 % (ref 38.4–76.8)
Platelets: 294 10*3/uL (ref 145–400)
RBC: 3.72 10*6/uL (ref 3.70–5.45)
RDW: 21 % — ABNORMAL HIGH (ref 11.2–14.5)
RETIC %: 2.23 % — AB (ref 0.70–2.10)
Retic Ct Abs: 82.96 10*3/uL (ref 33.70–90.70)
WBC: 6.1 10*3/uL (ref 3.9–10.3)
lymph#: 2 10*3/uL (ref 0.9–3.3)

## 2014-10-22 LAB — HOLD TUBE, BLOOD BANK

## 2014-10-22 LAB — FERRITIN CHCC: FERRITIN: 12 ng/mL (ref 9–269)

## 2014-10-22 NOTE — Telephone Encounter (Signed)
Per staff message and POF I have scheduled appts. Advised scheduler of appts. JMW  

## 2014-10-22 NOTE — Telephone Encounter (Signed)
-----   Message from [name], MD sent at 10/22/2014 11:59 AM EST ----- Regarding: iron def anemia She needs repeat IV iron weekly X 2, please schedule tomorrow ----- Message -----    From: [facility]    Sent: 10/22/2014  11:32 AM      To: [name], MD

## 2014-10-22 NOTE — Telephone Encounter (Signed)
Informed pt of Ferritin level low and she is scheduled for IV iron tomorrow at 2:45 pm and the following Friday.  Pt verbalized understanding and will come for Iron tomorrow as scheduled.

## 2014-10-23 ENCOUNTER — Ambulatory Visit (HOSPITAL_BASED_OUTPATIENT_CLINIC_OR_DEPARTMENT_OTHER): Payer: Medicare Other

## 2014-10-23 VITALS — BP 123/71 | HR 87 | Temp 98.8°F | Resp 18

## 2014-10-23 DIAGNOSIS — D509 Iron deficiency anemia, unspecified: Secondary | ICD-10-CM | POA: Diagnosis not present

## 2014-10-23 DIAGNOSIS — D75838 Other thrombocytosis: Secondary | ICD-10-CM

## 2014-10-23 DIAGNOSIS — R7989 Other specified abnormal findings of blood chemistry: Secondary | ICD-10-CM

## 2014-10-23 MED ORDER — SODIUM CHLORIDE 0.9 % IV SOLN
510.0000 mg | Freq: Once | INTRAVENOUS | Status: AC
  Administered 2014-10-23: 510 mg via INTRAVENOUS
  Filled 2014-10-23: qty 17

## 2014-10-23 MED ORDER — SODIUM CHLORIDE 0.9 % IV SOLN
INTRAVENOUS | Status: DC
  Administered 2014-10-23: 15:00:00 via INTRAVENOUS

## 2014-10-23 NOTE — Patient Instructions (Signed)

## 2014-10-30 ENCOUNTER — Ambulatory Visit (HOSPITAL_BASED_OUTPATIENT_CLINIC_OR_DEPARTMENT_OTHER): Payer: Medicare Other

## 2014-10-30 DIAGNOSIS — D509 Iron deficiency anemia, unspecified: Secondary | ICD-10-CM

## 2014-10-30 MED ORDER — FERUMOXYTOL INJECTION 510 MG/17 ML
510.0000 mg | Freq: Once | INTRAVENOUS | Status: AC
  Administered 2014-10-30: 510 mg via INTRAVENOUS
  Filled 2014-10-30: qty 17

## 2014-10-30 NOTE — Patient Instructions (Signed)

## 2014-11-16 ENCOUNTER — Other Ambulatory Visit: Payer: Self-pay | Admitting: Hematology and Oncology

## 2014-11-16 ENCOUNTER — Telehealth: Payer: Self-pay | Admitting: *Deleted

## 2014-11-16 ENCOUNTER — Other Ambulatory Visit (HOSPITAL_BASED_OUTPATIENT_CLINIC_OR_DEPARTMENT_OTHER): Payer: Medicare Other

## 2014-11-16 DIAGNOSIS — D509 Iron deficiency anemia, unspecified: Secondary | ICD-10-CM

## 2014-11-16 LAB — CBC & DIFF AND RETIC
BASO%: 0.7 % (ref 0.0–2.0)
Basophils Absolute: 0 10*3/uL (ref 0.0–0.1)
EOS%: 3.2 % (ref 0.0–7.0)
Eosinophils Absolute: 0.2 10*3/uL (ref 0.0–0.5)
HCT: 34.7 % — ABNORMAL LOW (ref 34.8–46.6)
HEMOGLOBIN: 10.5 g/dL — AB (ref 11.6–15.9)
Immature Retic Fract: 13.8 % — ABNORMAL HIGH (ref 1.60–10.00)
LYMPH#: 1.8 10*3/uL (ref 0.9–3.3)
LYMPH%: 34.1 % (ref 14.0–49.7)
MCH: 28.1 pg (ref 25.1–34.0)
MCHC: 30.3 g/dL — ABNORMAL LOW (ref 31.5–36.0)
MCV: 92.8 fL (ref 79.5–101.0)
MONO#: 0.5 10*3/uL (ref 0.1–0.9)
MONO%: 8.6 % (ref 0.0–14.0)
NEUT%: 53.4 % (ref 38.4–76.8)
NEUTROS ABS: 2.9 10*3/uL (ref 1.5–6.5)
Platelets: 293 10*3/uL (ref 145–400)
RBC: 3.74 10*6/uL (ref 3.70–5.45)
RDW: 21.2 % — AB (ref 11.2–14.5)
RETIC %: 2.62 % — AB (ref 0.70–2.10)
Retic Ct Abs: 97.99 10*3/uL — ABNORMAL HIGH (ref 33.70–90.70)
WBC: 5.4 10*3/uL (ref 3.9–10.3)

## 2014-11-16 LAB — FERRITIN CHCC: Ferritin: 94 ng/ml (ref 9–269)

## 2014-11-16 LAB — HOLD TUBE, BLOOD BANK

## 2014-11-16 NOTE — Telephone Encounter (Signed)
Pt notified of message below. States she can come in for infusion on Friday

## 2014-11-16 NOTE — Telephone Encounter (Signed)
-----   Message from [name], MD sent at 11/16/2014  3:18 PM EST ----- Regarding: labs Per last discussion, we want ferritin >100. Her repeat labs showed she is still anemic and ferritin <100. Can she come in Friday for iv iron? ----- Message -----    From: [facility]    Sent: 11/16/2014   1:46 PM      To: [name], MD

## 2014-11-17 ENCOUNTER — Telehealth: Payer: Self-pay | Admitting: Hematology and Oncology

## 2014-11-17 NOTE — Telephone Encounter (Signed)
s.w. pt and advised on 2.26 appt....pt ok and aware

## 2014-11-20 ENCOUNTER — Ambulatory Visit (HOSPITAL_BASED_OUTPATIENT_CLINIC_OR_DEPARTMENT_OTHER): Payer: Medicare Other

## 2014-11-20 DIAGNOSIS — D509 Iron deficiency anemia, unspecified: Secondary | ICD-10-CM | POA: Diagnosis not present

## 2014-11-20 MED ORDER — SODIUM CHLORIDE 0.9 % IV SOLN
510.0000 mg | Freq: Once | INTRAVENOUS | Status: AC
  Administered 2014-11-20: 510 mg via INTRAVENOUS
  Filled 2014-11-20: qty 17

## 2014-11-20 MED ORDER — SODIUM CHLORIDE 0.9 % IV SOLN
Freq: Once | INTRAVENOUS | Status: AC
  Administered 2014-11-20: 12:00:00 via INTRAVENOUS

## 2014-11-20 MED ORDER — SODIUM CHLORIDE 0.9 % IV SOLN
510.0000 mg | Freq: Once | INTRAVENOUS | Status: DC
  Filled 2014-11-20: qty 17

## 2014-11-20 NOTE — Patient Instructions (Signed)

## 2014-11-27 ENCOUNTER — Ambulatory Visit (HOSPITAL_BASED_OUTPATIENT_CLINIC_OR_DEPARTMENT_OTHER): Payer: Medicare Other

## 2014-11-27 DIAGNOSIS — D509 Iron deficiency anemia, unspecified: Secondary | ICD-10-CM | POA: Diagnosis not present

## 2014-11-27 MED ORDER — FERUMOXYTOL INJECTION 510 MG/17 ML
510.0000 mg | Freq: Once | INTRAVENOUS | Status: AC
  Administered 2014-11-27: 510 mg via INTRAVENOUS
  Filled 2014-11-27: qty 17

## 2014-11-27 NOTE — Patient Instructions (Signed)

## 2014-12-03 DIAGNOSIS — I10 Essential (primary) hypertension: Secondary | ICD-10-CM | POA: Diagnosis not present

## 2014-12-03 DIAGNOSIS — J449 Chronic obstructive pulmonary disease, unspecified: Secondary | ICD-10-CM | POA: Diagnosis not present

## 2014-12-03 DIAGNOSIS — D500 Iron deficiency anemia secondary to blood loss (chronic): Secondary | ICD-10-CM | POA: Diagnosis not present

## 2014-12-14 ENCOUNTER — Other Ambulatory Visit (HOSPITAL_BASED_OUTPATIENT_CLINIC_OR_DEPARTMENT_OTHER): Payer: Medicare Other

## 2014-12-14 DIAGNOSIS — D509 Iron deficiency anemia, unspecified: Secondary | ICD-10-CM

## 2014-12-14 LAB — CBC & DIFF AND RETIC
BASO%: 0.5 % (ref 0.0–2.0)
Basophils Absolute: 0 10*3/uL (ref 0.0–0.1)
EOS%: 2.9 % (ref 0.0–7.0)
Eosinophils Absolute: 0.2 10*3/uL (ref 0.0–0.5)
HCT: 37.4 % (ref 34.8–46.6)
HGB: 11.7 g/dL (ref 11.6–15.9)
Immature Retic Fract: 11.4 % — ABNORMAL HIGH (ref 1.60–10.00)
LYMPH%: 30.1 % (ref 14.0–49.7)
MCH: 30.2 pg (ref 25.1–34.0)
MCHC: 31.3 g/dL — ABNORMAL LOW (ref 31.5–36.0)
MCV: 96.4 fL (ref 79.5–101.0)
MONO#: 0.5 10*3/uL (ref 0.1–0.9)
MONO%: 9.5 % (ref 0.0–14.0)
NEUT#: 3.2 10*3/uL (ref 1.5–6.5)
NEUT%: 57 % (ref 38.4–76.8)
PLATELETS: 281 10*3/uL (ref 145–400)
RBC: 3.88 10*6/uL (ref 3.70–5.45)
RDW: 18.5 % — AB (ref 11.2–14.5)
Retic %: 2.15 % — ABNORMAL HIGH (ref 0.70–2.10)
Retic Ct Abs: 83.42 10*3/uL (ref 33.70–90.70)
WBC: 5.6 10*3/uL (ref 3.9–10.3)
lymph#: 1.7 10*3/uL (ref 0.9–3.3)

## 2014-12-14 LAB — HOLD TUBE, BLOOD BANK

## 2014-12-14 LAB — FERRITIN CHCC: Ferritin: 153 ng/ml (ref 9–269)

## 2014-12-15 ENCOUNTER — Telehealth: Payer: Self-pay | Admitting: Hematology and Oncology

## 2014-12-15 ENCOUNTER — Telehealth: Payer: Self-pay | Admitting: *Deleted

## 2014-12-15 NOTE — Telephone Encounter (Signed)
Informed pt of labs good, no longer anemic and Iron level wnl.  Informed her Dr. Alvy Bimler says she doesn't need to come back for 3 months.  Expect a call from Scheduler for lab and office visit one week later to be done in June.  Pt verbalized understanding and asks what she should do if she starts feeling bad before June?  Instructed pt to call us if she has any changes or concerns, we can always schedule her to check her labs sooner if indicated.

## 2014-12-15 NOTE — Telephone Encounter (Signed)
spoke with patient and advised on March appt cx per MD staff....sched pt for June....patient ok and aware

## 2014-12-15 NOTE — Telephone Encounter (Signed)
-----   Message from [name], MD sent at 12/15/2014  7:35 AM EDT ----- Regarding: labs She is no longer anemic and iron studies are adequate. I suggest repeat labs 1 week before I see her in 3 months with iv feraheme same day. Can you call the patient if this is OK? If so, please proceed to cancel her appt this month and reschedule to 3 months

## 2014-12-21 ENCOUNTER — Ambulatory Visit: Payer: Medicare Other

## 2014-12-21 ENCOUNTER — Ambulatory Visit: Payer: Medicare Other | Admitting: Hematology and Oncology

## 2014-12-28 ENCOUNTER — Ambulatory Visit: Payer: Medicare Other

## 2015-02-02 DIAGNOSIS — D509 Iron deficiency anemia, unspecified: Secondary | ICD-10-CM | POA: Diagnosis not present

## 2015-02-02 DIAGNOSIS — K921 Melena: Secondary | ICD-10-CM | POA: Diagnosis not present

## 2015-02-02 DIAGNOSIS — R109 Unspecified abdominal pain: Secondary | ICD-10-CM | POA: Diagnosis not present

## 2015-02-10 ENCOUNTER — Other Ambulatory Visit: Payer: Self-pay

## 2015-02-10 DIAGNOSIS — Z1231 Encounter for screening mammogram for malignant neoplasm of breast: Secondary | ICD-10-CM

## 2015-03-03 ENCOUNTER — Other Ambulatory Visit: Payer: Self-pay | Admitting: Hematology and Oncology

## 2015-03-03 ENCOUNTER — Telehealth: Payer: Self-pay | Admitting: Hematology and Oncology

## 2015-03-03 ENCOUNTER — Telehealth: Payer: Self-pay | Admitting: *Deleted

## 2015-03-03 DIAGNOSIS — D509 Iron deficiency anemia, unspecified: Secondary | ICD-10-CM

## 2015-03-03 NOTE — Telephone Encounter (Signed)
I placed POF for labs, see me and iv infusion on Friday [place], please alert precert of urgent request and hope we can still give her iron on Friday

## 2015-03-03 NOTE — Telephone Encounter (Signed)
Patient aware of 6/10 appointments

## 2015-03-03 NOTE — Telephone Encounter (Signed)
Patient called requesting appointment moved up due to difficulty walking and trouble breathing.  "I know I have lab appointment 03-09-2015 but I don't think I can make it until then.  I think my iron level is low.  Dr. Oletta Lamas told me my iron = 24 when I saw him in May so I think it's worse.  I give out of breath easy and trouble walking without sitting down.  I give out going to the bath room and back."   Return number [phone].  This nurse will notify Dr. Alvy Bimler.  Patient aware it may be tomorrow before return call.  Advised to go to ER if symptoms do not improve or worsen before this office notifies her of further instructions.  Asked if we can view her lab results.  No but she may call [facility], ask Dr. Oletta Lamas to send lab results.

## 2015-03-05 ENCOUNTER — Telehealth: Payer: Self-pay | Admitting: Hematology and Oncology

## 2015-03-05 ENCOUNTER — Other Ambulatory Visit (HOSPITAL_BASED_OUTPATIENT_CLINIC_OR_DEPARTMENT_OTHER): Payer: Medicare Other

## 2015-03-05 ENCOUNTER — Ambulatory Visit (HOSPITAL_BASED_OUTPATIENT_CLINIC_OR_DEPARTMENT_OTHER): Payer: Medicare Other

## 2015-03-05 ENCOUNTER — Encounter: Payer: Self-pay | Admitting: Hematology and Oncology

## 2015-03-05 ENCOUNTER — Ambulatory Visit (HOSPITAL_BASED_OUTPATIENT_CLINIC_OR_DEPARTMENT_OTHER): Payer: Medicare Other | Admitting: Hematology and Oncology

## 2015-03-05 ENCOUNTER — Other Ambulatory Visit: Payer: Self-pay | Admitting: Hematology and Oncology

## 2015-03-05 ENCOUNTER — Ambulatory Visit (HOSPITAL_COMMUNITY)
Admission: RE | Admit: 2015-03-05 | Discharge: 2015-03-05 | Disposition: A | Discharge status: Home / Self Care | Payer: Medicare Other | Source: Ambulatory Visit | Attending: Hematology and Oncology | Admitting: Hematology and Oncology

## 2015-03-05 VITALS — BP 152/73 | HR 77 | Temp 97.5°F | Resp 19 | Ht 63.0 in | Wt 223.8 lb

## 2015-03-05 VITALS — BP 135/64 | HR 76 | Temp 98.8°F | Resp 18

## 2015-03-05 DIAGNOSIS — F411 Generalized anxiety disorder: Secondary | ICD-10-CM

## 2015-03-05 DIAGNOSIS — D509 Iron deficiency anemia, unspecified: Secondary | ICD-10-CM | POA: Diagnosis not present

## 2015-03-05 DIAGNOSIS — K909 Intestinal malabsorption, unspecified: Secondary | ICD-10-CM | POA: Diagnosis not present

## 2015-03-05 DIAGNOSIS — K922 Gastrointestinal hemorrhage, unspecified: Secondary | ICD-10-CM | POA: Diagnosis not present

## 2015-03-05 DIAGNOSIS — I10 Essential (primary) hypertension: Secondary | ICD-10-CM | POA: Diagnosis not present

## 2015-03-05 LAB — CBC & DIFF AND RETIC
BASO%: 0.6 % (ref 0.0–2.0)
Basophils Absolute: 0 10*3/uL (ref 0.0–0.1)
EOS%: 3.9 % (ref 0.0–7.0)
Eosinophils Absolute: 0.3 10*3/uL (ref 0.0–0.5)
HCT: 28.3 % — ABNORMAL LOW (ref 34.8–46.6)
HGB: 8.4 g/dL — ABNORMAL LOW (ref 11.6–15.9)
Immature Retic Fract: 21.2 % — ABNORMAL HIGH (ref 1.60–10.00)
LYMPH%: 32.3 % (ref 14.0–49.7)
MCH: 25.3 pg (ref 25.1–34.0)
MCHC: 29.7 g/dL — ABNORMAL LOW (ref 31.5–36.0)
MCV: 85.2 fL (ref 79.5–101.0)
MONO#: 0.7 10*3/uL (ref 0.1–0.9)
MONO%: 10.7 % (ref 0.0–14.0)
NEUT#: 3.5 10*3/uL (ref 1.5–6.5)
NEUT%: 52.5 % (ref 38.4–76.8)
PLATELETS: 308 10*3/uL (ref 145–400)
RBC: 3.32 10*6/uL — AB (ref 3.70–5.45)
RDW: 15.7 % — ABNORMAL HIGH (ref 11.2–14.5)
Retic %: 2.05 % (ref 0.70–2.10)
Retic Ct Abs: 68.06 10*3/uL (ref 33.70–90.70)
WBC: 6.7 10*3/uL (ref 3.9–10.3)
lymph#: 2.2 10*3/uL (ref 0.9–3.3)

## 2015-03-05 LAB — IRON AND TIBC CHCC
Iron: <11 ug/dL — ABNORMAL LOW (ref 41–142)
TIBC: 375 ug/dL (ref 236–444)

## 2015-03-05 LAB — PREPARE RBC (CROSSMATCH)

## 2015-03-05 LAB — FERRITIN CHCC: FERRITIN: 8 ng/mL — AB (ref 9–269)

## 2015-03-05 LAB — HOLD TUBE, BLOOD BANK

## 2015-03-05 MED ORDER — SODIUM CHLORIDE 0.9 % IV SOLN
510.0000 mg | Freq: Once | INTRAVENOUS | Status: AC
  Administered 2015-03-05: 510 mg via INTRAVENOUS
  Filled 2015-03-05: qty 17

## 2015-03-05 MED ORDER — SODIUM CHLORIDE 0.9 % IV SOLN
250.0000 mL | Freq: Once | INTRAVENOUS | Status: AC
  Administered 2015-03-05: 250 mL via INTRAVENOUS

## 2015-03-05 NOTE — Assessment & Plan Note (Signed)
This is multifactorial, likely due to ongoing GI bleed  She had EGD and colonoscopy The plan would be to get her iron level above 100 with IV iron and to give her blood transfusion in anticipation for her procedure next week. We discussed some of the risks, benefits, and alternatives of blood transfusions. The patient is symptomatic from anemia and the hemoglobin level is critically low.  Some of the side-effects to be expected including risks of transfusion reactions, chills, infection, syndrome of volume overload and risk of hospitalization from various reasons and the patient is willing to proceed and went ahead to sign consent today. The most likely cause of her anemia is due to chronic blood loss/malabsorption syndrome. We discussed some of the risks, benefits, and alternatives of intravenous iron infusions. The patient is symptomatic from anemia and the iron level is critically low. She tolerated oral iron supplement poorly and desires to achieved higher levels of iron faster for adequate hematopoesis. Some of the side-effects to be expected including risks of infusion reactions, phlebitis, headaches, nausea and fatigue.  The patient is willing to proceed. Patient education material was dispensed.  Goal is to keep ferritin level greater than 100

## 2015-03-05 NOTE — Patient Instructions (Signed)

## 2015-03-05 NOTE — Assessment & Plan Note (Signed)
Her blood pressure is high which is suspect is due to anxiety. I will monitor for that carefully.

## 2015-03-05 NOTE — Progress Notes (Signed)
[facility] OFFICE PROGRESS NOTE  Shamleffer, Herschell Dimes, MD SUMMARY OF HEMATOLOGIC HISTORY:  This patient has been anemic for many years. I was able to review her CBC from 2010 and noted that she had progressive microcytic anemia. In 2013, she has severe anemia requiring blood transfusion. She underwent EGD and colonoscopy in 2013 which only showed a large hiatal hernia but no evidence of ulcer or source of bleeding. She had significant pica with excessive chewing of ice. Her last colonoscopy in 2013 was negative She received intravenous iron infusion on 11/26/2013, 04/09/2014 and 08/24/14.  She also received blood transfusion on 04/09/2014 and 08/19/14 for severe anemia. From January 2016 to June 2016, she needed iron infusion on a monthly basis. INTERVAL HISTORY: Angelica [name] 74 y.o. female returns for further follow-up. She complained of weakness and shortness of breath. The patient denies any recent signs or symptoms of bleeding such as spontaneous epistaxis, hematuria or hematochezia. She has colonoscopy scheduled for next week for further evaluation of GI bleed.  I have reviewed the past medical history, past surgical history, social history and family history with the patient and they are unchanged from previous note.  ALLERGIES:  is allergic to other; fosamax; penicillins; and sulfa antibiotics.  MEDICATIONS:  Current Outpatient Prescriptions  Medication Sig Dispense Refill  . amLODipine (NORVASC) 2.5 MG tablet Take 2.5 mg by mouth daily.    . diazepam (VALIUM) 5 MG tablet Take 5 mg by mouth every 6 (six) hours as needed for anxiety.    Marland Kitchen levothyroxine (SYNTHROID, LEVOTHROID) 125 MCG tablet Take 125 mcg by mouth daily.    . pantoprazole sodium (PROTONIX) 40 mg/20 mL PACK Place 20 mLs (40 mg total) into feeding tube 2 (two) times daily. 60 each 3   No current facility-administered medications for this visit.   Facility-Administered Medications Ordered in  Other Visits  Medication Dose Route Frequency Provider Last Rate Last Dose  . 0.9 %  sodium chloride infusion  250 mL Intravenous Once [name], MD         REVIEW OF SYSTEMS:   Constitutional: Denies fevers, chills or night sweats Eyes: Denies blurriness of vision Ears, nose, mouth, throat, and face: Denies mucositis or sore throat Respiratory: Denies cough, dyspnea or wheezes Cardiovascular: Denies palpitation, chest discomfort or lower extremity swelling Gastrointestinal:  Denies nausea, heartburn or change in bowel habits Skin: Denies abnormal skin rashes Lymphatics: Denies new lymphadenopathy or easy bruising Neurological:Denies numbness, tingling or new weaknesses Behavioral/Psych: Mood is stable, no new changes  All other systems were reviewed with the patient and are negative.  PHYSICAL EXAMINATION: ECOG PERFORMANCE STATUS: 1 - Symptomatic but completely ambulatory  Filed Vitals:   03/05/15 0927  BP: 152/73  Pulse: 77  Temp: 97.5 F (36.4 C)  Resp: 19   Filed Weights   03/05/15 0927  Weight: 223 lb 12.8 oz (101.515 kg)    GENERAL:alert, no distress and comfortable. She is obese. She looks pale. SKIN: skin color, texture, turgor are normal, no rashes or significant lesions EYES: normal, Conjunctiva are pink and non-injected, sclera clear OROPHARYNX:no exudate, no erythema and lips, buccal mucosa, and tongue normal  NECK: supple, thyroid normal size, non-tender, without nodularity LYMPH:  no palpable lymphadenopathy in the cervical, axillary or inguinal LUNGS: clear to auscultation and percussion with normal breathing effort HEART: regular rate & rhythm and no murmurs and no lower extremity edema ABDOMEN:abdomen soft, non-tender and normal bowel sounds Musculoskeletal:no cyanosis of digits and no clubbing  NEURO: alert & oriented x 3 with fluent speech, no focal motor/sensory deficits  LABORATORY DATA:  I have reviewed the data as listed Results for orders  placed or performed in visit on 03/05/15 (from the past 48 hour(s))  CBC & Diff and Retic     Status: Abnormal   Collection Time: 03/05/15  9:07 AM  Result Value Ref Range   WBC 6.7 3.9 - 10.3 10e3/uL   NEUT# 3.5 1.5 - 6.5 10e3/uL   HGB 8.4 (L) 11.6 - 15.9 g/dL   HCT 28.3 (L) 34.8 - 46.6 %   Platelets 308 145 - 400 10e3/uL   MCV 85.2 79.5 - 101.0 fL   MCH 25.3 25.1 - 34.0 pg   MCHC 29.7 (L) 31.5 - 36.0 g/dL   RBC 3.32 (L) 3.70 - 5.45 10e6/uL   RDW 15.7 (H) 11.2 - 14.5 %   lymph# 2.2 0.9 - 3.3 10e3/uL   MONO# 0.7 0.1 - 0.9 10e3/uL   Eosinophils Absolute 0.3 0.0 - 0.5 10e3/uL   Basophils Absolute 0.0 0.0 - 0.1 10e3/uL   NEUT% 52.5 38.4 - 76.8 %   LYMPH% 32.3 14.0 - 49.7 %   MONO% 10.7 0.0 - 14.0 %   EOS% 3.9 0.0 - 7.0 %   BASO% 0.6 0.0 - 2.0 %   Retic % 2.05 0.70 - 2.10 %   Retic Ct Abs 68.06 33.70 - 90.70 10e3/uL   Immature Retic Fract 21.20 (H) 1.60 - 10.00 %  Ferritin     Status: Abnormal   Collection Time: 03/05/15  9:07 AM  Result Value Ref Range   Ferritin 8 (L) 9 - 269 ng/ml  Iron and TIBC     Status: Abnormal   Collection Time: 03/05/15  9:07 AM  Result Value Ref Range   Iron <11 (L) 41 - 142 ug/dL   TIBC 375 236 - 444 ug/dL   UIBC Not calculated due to Iron < 11 120 - 384 ug/dL   %SAT Not calculated due to Iron < linear range. 21 - 57 %  Hold Tube, Blood Bank     Status: None   Collection Time: 03/05/15  9:07 AM  Result Value Ref Range   Hold Tube, Blood Bank Type and Crossmatch Added     Lab Results  Component Value Date   WBC 6.7 03/05/2015   HGB 8.4* 03/05/2015   HCT 28.3* 03/05/2015   MCV 85.2 03/05/2015   PLT 308 03/05/2015    ASSESSMENT & PLAN:  Iron deficiency anemia This is multifactorial, likely due to ongoing GI bleed  She had EGD and colonoscopy The plan would be to get her iron level above 100 with IV iron and to give her blood transfusion in anticipation for her procedure next week. We discussed some of the risks, benefits, and alternatives  of blood transfusions. The patient is symptomatic from anemia and the hemoglobin level is critically low.  Some of the side-effects to be expected including risks of transfusion reactions, chills, infection, syndrome of volume overload and risk of hospitalization from various reasons and the patient is willing to proceed and went ahead to sign consent today. The most likely cause of her anemia is due to chronic blood loss/malabsorption syndrome. We discussed some of the risks, benefits, and alternatives of intravenous iron infusions. The patient is symptomatic from anemia and the iron level is critically low. She tolerated oral iron supplement poorly and desires to achieved higher levels of iron faster for adequate hematopoesis. Some of  the side-effects to be expected including risks of infusion reactions, phlebitis, headaches, nausea and fatigue.  The patient is willing to proceed. Patient education material was dispensed.  Goal is to keep ferritin level greater than 100  Essential hypertension Her blood pressure is high which is suspect is due to anxiety. I will monitor for that carefully.   All questions were answered. The patient knows to call the clinic with any problems, questions or concerns. No barriers to learning was detected.  I spent 25 minutes counseling the patient face to face. The total time spent in the appointment was 30 minutes and more than 50% was on counseling.     [facility], Alisyn Lequire, MD 6/10/201612:22 PM

## 2015-03-05 NOTE — Telephone Encounter (Signed)
Gave and printed appt sched and avs for pt for [facility]

## 2015-03-08 ENCOUNTER — Other Ambulatory Visit: Payer: Self-pay | Admitting: Gastroenterology

## 2015-03-08 LAB — TYPE AND SCREEN
ABO/RH(D): A POS
ANTIBODY SCREEN: NEGATIVE
UNIT DIVISION: 0

## 2015-03-08 NOTE — Addendum Note (Signed)
Addended by: Vernie Ammons. on: 03/08/2015 11:17 AM   Modules accepted: Orders

## 2015-03-09 ENCOUNTER — Other Ambulatory Visit: Payer: Medicare Other

## 2015-03-10 ENCOUNTER — Telehealth: Payer: Self-pay | Admitting: Hematology and Oncology

## 2015-03-10 NOTE — Telephone Encounter (Signed)
returned call and s.w. pt and cx 6.17 due to colonoscopy

## 2015-03-12 ENCOUNTER — Ambulatory Visit (HOSPITAL_COMMUNITY)
Admission: RE | Admit: 2015-03-12 | Discharge: 2015-03-12 | Disposition: A | Discharge status: Home / Self Care | Payer: Medicare Other | Source: Ambulatory Visit | Attending: Gastroenterology | Admitting: Gastroenterology

## 2015-03-12 ENCOUNTER — Encounter (HOSPITAL_COMMUNITY)
Admission: RE | Disposition: A | Discharge status: Home / Self Care | Payer: Self-pay | Source: Ambulatory Visit | Attending: Gastroenterology

## 2015-03-12 ENCOUNTER — Encounter (HOSPITAL_COMMUNITY): Payer: Self-pay

## 2015-03-12 ENCOUNTER — Other Ambulatory Visit: Payer: Medicare Other

## 2015-03-12 ENCOUNTER — Ambulatory Visit: Payer: Medicare Other

## 2015-03-12 DIAGNOSIS — D509 Iron deficiency anemia, unspecified: Secondary | ICD-10-CM | POA: Diagnosis not present

## 2015-03-12 DIAGNOSIS — K921 Melena: Secondary | ICD-10-CM | POA: Diagnosis not present

## 2015-03-12 DIAGNOSIS — K5730 Diverticulosis of large intestine without perforation or abscess without bleeding: Secondary | ICD-10-CM | POA: Insufficient documentation

## 2015-03-12 DIAGNOSIS — I10 Essential (primary) hypertension: Secondary | ICD-10-CM | POA: Diagnosis not present

## 2015-03-12 DIAGNOSIS — K449 Diaphragmatic hernia without obstruction or gangrene: Secondary | ICD-10-CM | POA: Diagnosis not present

## 2015-03-12 DIAGNOSIS — D649 Anemia, unspecified: Secondary | ICD-10-CM | POA: Diagnosis present

## 2015-03-12 DIAGNOSIS — Z79899 Other long term (current) drug therapy: Secondary | ICD-10-CM | POA: Insufficient documentation

## 2015-03-12 DIAGNOSIS — Z9981 Dependence on supplemental oxygen: Secondary | ICD-10-CM | POA: Insufficient documentation

## 2015-03-12 DIAGNOSIS — K219 Gastro-esophageal reflux disease without esophagitis: Secondary | ICD-10-CM | POA: Insufficient documentation

## 2015-03-12 DIAGNOSIS — Z87891 Personal history of nicotine dependence: Secondary | ICD-10-CM | POA: Diagnosis not present

## 2015-03-12 DIAGNOSIS — Z85850 Personal history of malignant neoplasm of thyroid: Secondary | ICD-10-CM | POA: Insufficient documentation

## 2015-03-12 DIAGNOSIS — J449 Chronic obstructive pulmonary disease, unspecified: Secondary | ICD-10-CM | POA: Diagnosis not present

## 2015-03-12 HISTORY — PX: HOT HEMOSTASIS: SHX5433

## 2015-03-12 HISTORY — PX: COLONOSCOPY: SHX5424

## 2015-03-12 SURGERY — COLONOSCOPY
Anesthesia: Moderate Sedation

## 2015-03-12 MED ORDER — MIDAZOLAM HCL 5 MG/ML IJ SOLN
INTRAMUSCULAR | Status: AC
Start: 2015-03-12 — End: 2015-03-12
  Filled 2015-03-12: qty 2

## 2015-03-12 MED ORDER — FENTANYL CITRATE (PF) 100 MCG/2ML IJ SOLN
INTRAMUSCULAR | Status: DC | PRN
  Administered 2015-03-12 (×4): 25 ug via INTRAVENOUS

## 2015-03-12 MED ORDER — FENTANYL CITRATE (PF) 100 MCG/2ML IJ SOLN
INTRAMUSCULAR | Status: AC
  Filled 2015-03-12: qty 2

## 2015-03-12 MED ORDER — DIPHENHYDRAMINE HCL 50 MG/ML IJ SOLN
INTRAMUSCULAR | Status: AC
  Filled 2015-03-12: qty 1

## 2015-03-12 MED ORDER — SODIUM CHLORIDE 0.9 % IV SOLN: INTRAVENOUS | Status: DC

## 2015-03-12 MED ORDER — MIDAZOLAM HCL 5 MG/5ML IJ SOLN
INTRAMUSCULAR | Status: DC | PRN
  Administered 2015-03-12 (×2): 2 mg via INTRAVENOUS
  Administered 2015-03-12 (×2): 1 mg via INTRAVENOUS
  Administered 2015-03-12: 2 mg via INTRAVENOUS
  Administered 2015-03-12: 1 mg via INTRAVENOUS

## 2015-03-12 NOTE — Op Note (Signed)
[facility] [place], [address]   COLONOSCOPY PROCEDURE REPORT  PATIENT: Angelica, [name]  MR#: [id number] BIRTHDATE: 02-Jun-1941 , 73  yrs. old GENDER: female ENDOSCOPIST: Laurence Spates, MD REFERRED BY: Dr    Kelton Pillar, Dr Alvy Bimler PROCEDURE DATE:  03-18-15 PROCEDURE:   Colonoscopy, diagnostic ASA CLASS:   Class II INDICATIONS:chronic iron deficiency anemia and heme occult positive stools.  Last colonoscopy 3 years ago, EGD shows large hiatal hernia procedure done with plans to use APC to fulgerate any lesions found. MEDICATIONS: Fentanyl 100 mcg IV and Versed 9 mg IV  DESCRIPTION OF PROCEDURE:   After the risks and benefits and of the procedure were explained, informed consent was obtained.  revealed no abnormalities of the rectum.    The Pentax Ped Colon [id number] endoscope was introduced through the anus and advanced to the cecum, which was identified by both the appendix and ileocecal valve .  The quality of the prep was excellent. .  The instrument was then slowly withdrawn as the colon was fully examined. Estimated blood loss is zero unless otherwise noted in this procedure report.     COLON FINDINGS: There was moderate diverticulosis noted in the sigmoid colon.   No polyps, AVMs, or other sources of bleeding were seen.     Retroflexed views revealed no abnormalities.     The scope was then withdrawn from the patient and the procedure completed.  WITHDRAWAL TIME: 15 minutes 0 seconds  COMPLICATIONS: There were no immediate complications. ENDOSCOPIC IMPRESSION: 1.   There was moderate diverticulosis noted in the sigmoid colon 2.   No polyps, AVMs, or other sources of bleeding were seen 3.    No bleeding source seen in the colon RECOMMENDATIONS: Will continue iron replacement and follow patient back in the office and one month.  REPEAT EXAM:  cc:    Dr Kelton Pillar, Dr Alvy Bimler  _______________________________ eSigned:  Laurence Spates, MD 03/18/15 11:10 AM   CPT CODES: ICD CODES:  The ICD and CPT codes recommended by this software are interpretations from the data that the clinical staff has captured with the software.  The verification of the translation of this report to the ICD and CPT codes and modifiers is the sole responsibility of the health care institution and practicing physician where this report was generated.  [employer] will not be held responsible for the validity of the ICD and CPT codes included on this report.  AMA assumes no liability for data contained or not contained herein. CPT is a Designer, television/film set of the [employer].   PATIENT NAME:  Angelica, [name] MR#: [id number]

## 2015-03-12 NOTE — Discharge Instructions (Addendum)
Continue the oral iron pills  Colonoscopy, Care After These instructions give you information on caring for yourself after your procedure. Your doctor may also give you more specific instructions. Call your doctor if you have any problems or questions after your procedure. HOME CARE  Do not drive for 24 hours.  Do not sign important papers or use machinery for 24 hours.  You may shower.  You may go back to your usual activities, but go slower for the first 24 hours.  Take rest breaks often during the first 24 hours.  Walk around or use warm packs on your belly (abdomen) if you have belly cramping or gas.  Drink enough fluids to keep your pee (urine) clear or pale yellow.  Resume your normal diet. Avoid heavy or fried foods.  Avoid drinking alcohol for 24 hours or as told by your doctor.  Only take medicines as told by your doctor. If a tissue sample (biopsy) was taken during the procedure:   Do not take aspirin or blood thinners for 7 days, or as told by your doctor.  Do not drink alcohol for 7 days, or as told by your doctor.  Eat soft foods for the first 24 hours. GET HELP IF: You still have a small amount of blood in your poop (stool) 2-3 days after the procedure. GET HELP RIGHT AWAY IF:  You have more than a small amount of blood in your poop.  You see clumps of tissue (blood clots) in your poop.  Your belly is puffy (swollen).  You feel sick to your stomach (nauseous) or throw up (vomit).  You have a fever.  You have belly pain that gets worse and medicine does not help. MAKE SURE YOU:  Understand these instructions.  Will watch your condition.  Will get help right away if you are not doing well or get worse. Document Released: 10/14/2010 Document Revised: 09/16/2013 Document Reviewed: 05/19/2013 [facility] Patient Information 2015 [place], [place]. This information is not intended to replace advice given to you by your health care provider. Make sure you  discuss any questions you have with your health care provider.

## 2015-03-12 NOTE — H&P (Addendum)
Subjective:   Patient is a 74 y.o. female presents with heme positive stools and chronic anemia. She has been on oral iron and has had have IV iron as well. Colonoscopy 2013 showed hemorrhoids a few diverticula. EGD 12/15 was normal other than a large hiatal hernia. Biopsies were negative for celiac disease. Due to the continued positive stool repeat colonoscopies performed. The patient is on nocturnal 02 at home due to COPD. Procedure including risks and benefits discussed in office.  Patient Active Problem List   Diagnosis Date Noted  . Reactive thrombocytosis 08/24/2014  . Celiac disease 11/17/2013  . Other nonspecific abnormal cardiovascular system function study 09/27/2012  . Pulmonary HTN 09/27/2012  . Blood in stool 07/04/2012  . History of thyroid cancer- s/p total thyroidectomy 2012 07/04/2012  . Fatigue 07/04/2012  . Iron deficiency anemia 07/04/2012  . COPD (chronic obstructive pulmonary disease)- on nocturnal oxygen at home 07/04/2012  . GERD (gastroesophageal reflux disease) 07/04/2012  . Chronic anemia- symptomatic 07/03/2012  . Hypokalemia 07/03/2012  . Essential hypertension 07/03/2012   Past Medical History  Diagnosis Date  . Hypertension   . Arthritis   . Asthma   . Cancer     thyroid  . COPD (chronic obstructive pulmonary disease)   . Thyroid disease   . Anemia   . Celiac disease 11/17/2013    Past Surgical History  Procedure Laterality Date  . Abdominal hysterectomy    . Tonsillectomy    . Cholecystectomy    . Esophagogastroduodenoscopy  07/05/2012    Procedure: ESOPHAGOGASTRODUODENOSCOPY (EGD);  Surgeon: Winfield Cunas., MD;  Location[facility] ENDOSCOPY;  Service: Endoscopy;  Laterality: N/A;  . Colonoscopy  07/05/2012    Procedure: COLONOSCOPY;  Surgeon: Winfield Cunas., MD;  Location: [facility] ENDOSCOPY;  Service: Endoscopy;  Laterality: N/A;    Prescriptions prior to admission  Medication Sig Dispense Refill Last Dose  . amLODipine (NORVASC) 2.5 MG  tablet Take 2.5 mg by mouth daily.   03/11/2015 at 1900  . levothyroxine (SYNTHROID, LEVOTHROID) 125 MCG tablet Take 125 mcg by mouth daily.   03/11/2015 at 0700  . pantoprazole sodium (PROTONIX) 40 mg/20 mL PACK Place 20 mLs (40 mg total) into feeding tube 2 (two) times daily. 60 each 3 Past Month at Unknown time  . diazepam (VALIUM) 5 MG tablet Take 5 mg by mouth every 6 (six) hours as needed for anxiety.   Unknown at Unknown time   Allergies  Allergen Reactions  . Other Hives    Pt states she has several other allergies but can't recall the names, pt recalled the medication to be codeine.  . Fosamax [Alendronate Sodium]   . Penicillins   . Sulfa Antibiotics     History  Substance Use Topics  . Smoking status: Former Smoker    Quit date: 07/03/2008  . Smokeless tobacco: Never Used  . Alcohol Use: No    Family History  Problem Relation Age of Onset  . Heart disease Mother   . Kidney failure Father   . Cancer Sister     thyroid cancer     Objective:   Patient Vitals for the past 8 hrs:  BP Temp Temp src Pulse Resp Height Weight  03/12/15 0903 (!) 171/105 mmHg 98.5 F (36.9 C) Oral 79 17 5\' 3"  (1.6 m) 101.152 kg (223 lb)         See MD Preop evaluation      Assessment:   1. Chronic intermittent heme positive stool and  iron deficiency anemia  Plan:   Repeat colonoscopy today with APC on standby to cauterize any AVMs that could be found.

## 2015-03-15 ENCOUNTER — Encounter (HOSPITAL_COMMUNITY): Payer: Self-pay | Admitting: Gastroenterology

## 2015-03-16 ENCOUNTER — Ambulatory Visit (HOSPITAL_BASED_OUTPATIENT_CLINIC_OR_DEPARTMENT_OTHER): Payer: Medicare Other

## 2015-03-16 ENCOUNTER — Telehealth: Payer: Self-pay | Admitting: Hematology and Oncology

## 2015-03-16 ENCOUNTER — Encounter: Payer: Self-pay | Admitting: Hematology and Oncology

## 2015-03-16 ENCOUNTER — Other Ambulatory Visit (HOSPITAL_BASED_OUTPATIENT_CLINIC_OR_DEPARTMENT_OTHER): Payer: Medicare Other

## 2015-03-16 ENCOUNTER — Ambulatory Visit (HOSPITAL_BASED_OUTPATIENT_CLINIC_OR_DEPARTMENT_OTHER): Payer: Medicare Other | Admitting: Hematology and Oncology

## 2015-03-16 VITALS — BP 175/96 | HR 70 | Temp 97.8°F | Resp 20 | Wt 221.0 lb

## 2015-03-16 DIAGNOSIS — D509 Iron deficiency anemia, unspecified: Secondary | ICD-10-CM | POA: Diagnosis present

## 2015-03-16 LAB — CBC & DIFF AND RETIC
BASO%: 0.5 % (ref 0.0–2.0)
Basophils Absolute: 0 10*3/uL (ref 0.0–0.1)
EOS ABS: 0.3 10*3/uL (ref 0.0–0.5)
EOS%: 4.8 % (ref 0.0–7.0)
HCT: 37.6 % (ref 34.8–46.6)
HGB: 11.2 g/dL — ABNORMAL LOW (ref 11.6–15.9)
Immature Retic Fract: 13.6 % — ABNORMAL HIGH (ref 1.60–10.00)
LYMPH%: 31.2 % (ref 14.0–49.7)
MCH: 26.4 pg (ref 25.1–34.0)
MCHC: 29.8 g/dL — ABNORMAL LOW (ref 31.5–36.0)
MCV: 88.5 fL (ref 79.5–101.0)
MONO#: 0.5 10*3/uL (ref 0.1–0.9)
MONO%: 8.4 % (ref 0.0–14.0)
NEUT%: 55.1 % (ref 38.4–76.8)
NEUTROS ABS: 3.4 10*3/uL (ref 1.5–6.5)
Platelets: 260 10*3/uL (ref 145–400)
RBC: 4.25 10*6/uL (ref 3.70–5.45)
RDW: 19.2 % — AB (ref 11.2–14.5)
Retic %: 2.63 % — ABNORMAL HIGH (ref 0.70–2.10)
Retic Ct Abs: 111.78 10*3/uL — ABNORMAL HIGH (ref 33.70–90.70)
WBC: 6.2 10*3/uL (ref 3.9–10.3)
lymph#: 1.9 10*3/uL (ref 0.9–3.3)
nRBC: 0 % (ref 0–0)

## 2015-03-16 LAB — HOLD TUBE, BLOOD BANK

## 2015-03-16 LAB — FERRITIN CHCC: Ferritin: 98 ng/ml (ref 9–269)

## 2015-03-16 MED ORDER — SODIUM CHLORIDE 0.9 % IV SOLN
Freq: Once | INTRAVENOUS | Status: AC
  Administered 2015-03-16: 13:00:00 via INTRAVENOUS

## 2015-03-16 MED ORDER — SODIUM CHLORIDE 0.9 % IV SOLN
510.0000 mg | Freq: Once | INTRAVENOUS | Status: AC
  Administered 2015-03-16: 510 mg via INTRAVENOUS
  Filled 2015-03-16: qty 17

## 2015-03-16 NOTE — Progress Notes (Signed)
[facility] OFFICE PROGRESS NOTE  Shamleffer, Herschell Dimes, MD SUMMARY OF HEMATOLOGIC HISTORY:  This patient has been anemic for many years. I was able to review her CBC from 2010 and noted that she had progressive microcytic anemia. In 2013, she has severe anemia requiring blood transfusion. She underwent EGD and colonoscopy in 2013 which only showed a large hiatal hernia but no evidence of ulcer or source of bleeding. She had significant pica with excessive chewing of ice. Her last colonoscopy in 2013 was negative She received intravenous iron infusion on 11/26/2013, 04/09/2014 and 08/24/14.  She also received blood transfusion on 04/09/2014 and 08/19/14 for severe anemia. From January 2016 to June 2016, she needed iron infusion on a monthly basis. On 03/05/2015, she received 1 unit of blood. On 03/12/2015, colonoscopy showed no evidence of active bleeding or hemorrhoids. INTERVAL HISTORY: Angelica [name] 74 y.o. female returns for  Further follow-up. She feels well. Denies any chest pain or shortness of breath. The patient denies any recent signs or symptoms of bleeding such as spontaneous epistaxis, hematuria or hematochezia.   I have reviewed the past medical history, past surgical history, social history and family history with the patient and they are unchanged from previous note.  ALLERGIES:  is allergic to other; fosamax; penicillins; and sulfa antibiotics.  MEDICATIONS:  Current Outpatient Prescriptions  Medication Sig Dispense Refill  . amLODipine (NORVASC) 2.5 MG tablet Take 2.5 mg by mouth daily.    . diazepam (VALIUM) 5 MG tablet Take 5 mg by mouth every 6 (six) hours as needed for anxiety.    Marland Kitchen levothyroxine (SYNTHROID, LEVOTHROID) 125 MCG tablet Take 125 mcg by mouth daily.    . pantoprazole sodium (PROTONIX) 40 mg/20 mL PACK Place 20 mLs (40 mg total) into feeding tube 2 (two) times daily. 60 each 3   No current facility-administered medications for  this visit.   Facility-Administered Medications Ordered in Other Visits  Medication Dose Route Frequency Provider Last Rate Last Dose  . [START ON 03/17/2015] ferumoxytol (FERAHEME) 510 mg in sodium chloride 0.9 % 100 mL IVPB  510 mg Intravenous Once [name], MD         REVIEW OF SYSTEMS:   Constitutional: Denies fevers, chills or night sweats Eyes: Denies blurriness of vision Ears, nose, mouth, throat, and face: Denies mucositis or sore throat Respiratory: Denies cough, dyspnea or wheezes Cardiovascular: Denies palpitation, chest discomfort or lower extremity swelling Gastrointestinal:  Denies nausea, heartburn or change in bowel habits Skin: Denies abnormal skin rashes Lymphatics: Denies new lymphadenopathy or easy bruising Neurological:Denies numbness, tingling or new weaknesses Behavioral/Psych: Mood is stable, no new changes  All other systems were reviewed with the patient and are negative.  PHYSICAL EXAMINATION: ECOG PERFORMANCE STATUS: 0 - Asymptomatic  Filed Vitals:   03/16/15 1248  BP: 175/96  Pulse: 70  Temp: 97.8 F (36.6 C)  Resp: 20   Filed Weights   03/16/15 1248  Weight: 221 lb (100.245 kg)    GENERAL:alert, no distress and comfortable SKIN: skin color, texture, turgor are normal, no rashes or significant lesions EYES: normal, Conjunctiva are pink and non-injected, sclera clear Musculoskeletal:no cyanosis of digits and no clubbing  NEURO: alert & oriented x 3 with fluent speech, no focal motor/sensory deficits  LABORATORY DATA:  I have reviewed the data as listed Results for orders placed or performed in visit on 03/16/15 (from the past 48 hour(s))  CBC & Diff and Retic     Status: Abnormal  Collection Time: 03/16/15 12:19 PM  Result Value Ref Range   WBC 6.2 3.9 - 10.3 10e3/uL   NEUT# 3.4 1.5 - 6.5 10e3/uL   HGB 11.2 (L) 11.6 - 15.9 g/dL   HCT 37.6 34.8 - 46.6 %   Platelets 260 145 - 400 10e3/uL   MCV 88.5 79.5 - 101.0 fL   MCH 26.4 25.1 -  34.0 pg   MCHC 29.8 (L) 31.5 - 36.0 g/dL   RBC 4.25 3.70 - 5.45 10e6/uL   RDW 19.2 (H) 11.2 - 14.5 %   lymph# 1.9 0.9 - 3.3 10e3/uL   MONO# 0.5 0.1 - 0.9 10e3/uL   Eosinophils Absolute 0.3 0.0 - 0.5 10e3/uL   Basophils Absolute 0.0 0.0 - 0.1 10e3/uL   NEUT% 55.1 38.4 - 76.8 %   LYMPH% 31.2 14.0 - 49.7 %   MONO% 8.4 0.0 - 14.0 %   EOS% 4.8 0.0 - 7.0 %   BASO% 0.5 0.0 - 2.0 %   nRBC 0 0 - 0 %   Retic % 2.63 (H) 0.70 - 2.10 %   Retic Ct Abs 111.78 (H) 33.70 - 90.70 10e3/uL   Immature Retic Fract 13.60 (H) 1.60 - 10.00 %  Hold Tube, Blood Bank     Status: None   Collection Time: 03/16/15 12:19 PM  Result Value Ref Range   Hold Tube, Blood Bank Blood Bank Order Cancelled     Lab Results  Component Value Date   WBC 6.2 03/16/2015   HGB 11.2* 03/16/2015   HCT 37.6 03/16/2015   MCV 88.5 03/16/2015   PLT 260 03/16/2015   ASSESSMENT & PLAN:  Iron deficiency anemia This is multifactorial, likely due to ongoing GI bleed  She had EGD and colonoscopy The plan would be to get her iron level above 100 with IV iron. We discussed some of the risks, benefits, and alternatives of intravenous iron infusions. The patient is symptomatic from anemia and the iron level is critically low. She tolerated oral iron supplement poorly and desires to achieved higher levels of iron faster for adequate hematopoesis. Some of the side-effects to be expected including risks of infusion reactions, phlebitis, headaches, nausea and fatigue.  The patient is willing to proceed. Patient education material was dispensed.  Goal is to keep ferritin level greater than 100 After IV iron today and next week, she will come back on a monthly basis for blood work monitoring and transfusion support or IV iron as indicated to keep her out of trouble. I will see her back in 3 months.   All questions were answered. The patient knows to call the clinic with any problems, questions or concerns. No barriers to learning was  detected.  I spent 15 minutes counseling the patient face to face. The total time spent in the appointment was 20 minutes and more than 50% was on counseling.     [facility], Dencil Cayson, MD 6/21/20161:02 PM

## 2015-03-16 NOTE — Patient Instructions (Signed)

## 2015-03-16 NOTE — Assessment & Plan Note (Signed)
This is multifactorial, likely due to ongoing GI bleed  She had EGD and colonoscopy The plan would be to get her iron level above 100 with IV iron. We discussed some of the risks, benefits, and alternatives of intravenous iron infusions. The patient is symptomatic from anemia and the iron level is critically low. She tolerated oral iron supplement poorly and desires to achieved higher levels of iron faster for adequate hematopoesis. Some of the side-effects to be expected including risks of infusion reactions, phlebitis, headaches, nausea and fatigue.  The patient is willing to proceed. Patient education material was dispensed.  Goal is to keep ferritin level greater than 100 After IV iron today and next week, she will come back on a monthly basis for blood work monitoring and transfusion support or IV iron as indicated to keep her out of trouble. I will see her back in 3 months.

## 2015-03-16 NOTE — Telephone Encounter (Signed)
Appointments made and patient will get a new avs in chemo with added appointments

## 2015-03-23 ENCOUNTER — Ambulatory Visit (HOSPITAL_BASED_OUTPATIENT_CLINIC_OR_DEPARTMENT_OTHER): Payer: Medicare Other

## 2015-03-23 VITALS — BP 108/64 | HR 76 | Temp 98.0°F | Resp 18

## 2015-03-23 DIAGNOSIS — D509 Iron deficiency anemia, unspecified: Secondary | ICD-10-CM | POA: Diagnosis present

## 2015-03-23 MED ORDER — SODIUM CHLORIDE 0.9 % IV SOLN
INTRAVENOUS | Status: DC
  Administered 2015-03-23: 14:00:00 via INTRAVENOUS

## 2015-03-23 MED ORDER — SODIUM CHLORIDE 0.9 % IV SOLN
510.0000 mg | Freq: Once | INTRAVENOUS | Status: AC
  Administered 2015-03-23: 510 mg via INTRAVENOUS
  Filled 2015-03-23: qty 17

## 2015-03-23 NOTE — Patient Instructions (Signed)

## 2015-03-31 ENCOUNTER — Ambulatory Visit
Admission: RE | Admit: 2015-03-31 | Discharge: 2015-03-31 | Disposition: A | Discharge status: Home / Self Care | Payer: Medicare Other | Source: Ambulatory Visit

## 2015-03-31 DIAGNOSIS — Z1231 Encounter for screening mammogram for malignant neoplasm of breast: Secondary | ICD-10-CM

## 2015-04-12 DIAGNOSIS — D509 Iron deficiency anemia, unspecified: Secondary | ICD-10-CM | POA: Diagnosis not present

## 2015-04-12 DIAGNOSIS — Z85850 Personal history of malignant neoplasm of thyroid: Secondary | ICD-10-CM | POA: Diagnosis not present

## 2015-04-12 DIAGNOSIS — J449 Chronic obstructive pulmonary disease, unspecified: Secondary | ICD-10-CM | POA: Diagnosis not present

## 2015-04-19 DIAGNOSIS — D509 Iron deficiency anemia, unspecified: Secondary | ICD-10-CM | POA: Diagnosis not present

## 2015-04-23 ENCOUNTER — Ambulatory Visit (HOSPITAL_BASED_OUTPATIENT_CLINIC_OR_DEPARTMENT_OTHER): Payer: Medicare Other

## 2015-04-23 ENCOUNTER — Telehealth: Payer: Self-pay | Admitting: *Deleted

## 2015-04-23 ENCOUNTER — Other Ambulatory Visit: Payer: Self-pay | Admitting: Hematology and Oncology

## 2015-04-23 DIAGNOSIS — D509 Iron deficiency anemia, unspecified: Secondary | ICD-10-CM | POA: Diagnosis present

## 2015-04-23 LAB — CBC & DIFF AND RETIC
BASO%: 0.4 % (ref 0.0–2.0)
BASOS ABS: 0 10*3/uL (ref 0.0–0.1)
EOS ABS: 0.2 10*3/uL (ref 0.0–0.5)
EOS%: 3.8 % (ref 0.0–7.0)
HEMATOCRIT: 36.5 % (ref 34.8–46.6)
HGB: 11.6 g/dL (ref 11.6–15.9)
Immature Retic Fract: 6.6 % (ref 1.60–10.00)
LYMPH#: 1.9 10*3/uL (ref 0.9–3.3)
LYMPH%: 34.7 % (ref 14.0–49.7)
MCH: 29.6 pg (ref 25.1–34.0)
MCHC: 31.8 g/dL (ref 31.5–36.0)
MCV: 93.1 fL (ref 79.5–101.0)
MONO#: 0.4 10*3/uL (ref 0.1–0.9)
MONO%: 7.6 % (ref 0.0–14.0)
NEUT#: 3 10*3/uL (ref 1.5–6.5)
NEUT%: 53.5 % (ref 38.4–76.8)
Platelets: 250 10*3/uL (ref 145–400)
RBC: 3.92 10*6/uL (ref 3.70–5.45)
RDW: 20.4 % — AB (ref 11.2–14.5)
RETIC CT ABS: 96.82 10*3/uL — AB (ref 33.70–90.70)
Retic %: 2.47 % — ABNORMAL HIGH (ref 0.70–2.10)
WBC: 5.6 10*3/uL (ref 3.9–10.3)

## 2015-04-23 LAB — FERRITIN CHCC: FERRITIN: 114 ng/mL (ref 9–269)

## 2015-04-23 LAB — HOLD TUBE, BLOOD BANK

## 2015-04-23 NOTE — Telephone Encounter (Signed)
Informed pt of Ferritin level good and she was already aware of CBC results good.  Keep lab appt next month as scheduled.  She verbalized understanding.

## 2015-04-23 NOTE — Telephone Encounter (Signed)
-----   Message from [name], MD sent at 04/23/2015  3:40 PM EDT ----- Regarding: ferritin Level is good, >100 Keep current lab schedule ----- Message -----    From: [facility] in [facility] Interface    Sent: 04/23/2015   1:41 PM      To: [name], MD

## 2015-05-10 DIAGNOSIS — D509 Iron deficiency anemia, unspecified: Secondary | ICD-10-CM | POA: Diagnosis not present

## 2015-05-10 DIAGNOSIS — K921 Melena: Secondary | ICD-10-CM | POA: Diagnosis not present

## 2015-05-24 ENCOUNTER — Other Ambulatory Visit (HOSPITAL_BASED_OUTPATIENT_CLINIC_OR_DEPARTMENT_OTHER): Payer: Medicare Other

## 2015-05-24 ENCOUNTER — Telehealth: Payer: Self-pay | Admitting: *Deleted

## 2015-05-24 ENCOUNTER — Other Ambulatory Visit: Payer: Self-pay | Admitting: Hematology and Oncology

## 2015-05-24 ENCOUNTER — Telehealth: Payer: Self-pay | Admitting: Hematology and Oncology

## 2015-05-24 DIAGNOSIS — D509 Iron deficiency anemia, unspecified: Secondary | ICD-10-CM

## 2015-05-24 LAB — CBC & DIFF AND RETIC
BASO%: 0.3 % (ref 0.0–2.0)
Basophils Absolute: 0 10*3/uL (ref 0.0–0.1)
EOS ABS: 0.2 10*3/uL (ref 0.0–0.5)
EOS%: 3.6 % (ref 0.0–7.0)
HCT: 37.3 % (ref 34.8–46.6)
HEMOGLOBIN: 11.8 g/dL (ref 11.6–15.9)
Immature Retic Fract: 9.9 % (ref 1.60–10.00)
LYMPH%: 34.7 % (ref 14.0–49.7)
MCH: 29.6 pg (ref 25.1–34.0)
MCHC: 31.6 g/dL (ref 31.5–36.0)
MCV: 93.5 fL (ref 79.5–101.0)
MONO#: 0.6 10*3/uL (ref 0.1–0.9)
MONO%: 9.6 % (ref 0.0–14.0)
NEUT#: 3.3 10*3/uL (ref 1.5–6.5)
NEUT%: 51.8 % (ref 38.4–76.8)
Platelets: 249 10*3/uL (ref 145–400)
RBC: 3.99 10*6/uL (ref 3.70–5.45)
RDW: 16.4 % — AB (ref 11.2–14.5)
RETIC %: 1.95 % (ref 0.70–2.10)
RETIC CT ABS: 77.81 10*3/uL (ref 33.70–90.70)
WBC: 6.3 10*3/uL (ref 3.9–10.3)
lymph#: 2.2 10*3/uL (ref 0.9–3.3)

## 2015-05-24 LAB — FERRITIN CHCC: Ferritin: 28 ng/ml (ref 9–269)

## 2015-05-24 LAB — HOLD TUBE, BLOOD BANK

## 2015-05-24 NOTE — Telephone Encounter (Signed)
Informed pt of need for IV iron per Dr. Alvy Bimler.  Order sent to Scheduler for IV iron this Friday 9/2 and next Friday 9/9.   Pt verbalized understanding and will expect a call from Scheduling w/ time of appt for IV iron.   She received copy of her lab work earlier today.

## 2015-05-24 NOTE — Telephone Encounter (Signed)
lvm for pt regarding to Sept appt.Marland KitchenMarland KitchenMarland Kitchen

## 2015-05-28 ENCOUNTER — Ambulatory Visit (HOSPITAL_BASED_OUTPATIENT_CLINIC_OR_DEPARTMENT_OTHER): Payer: Medicare Other

## 2015-05-28 VITALS — BP 141/75 | HR 82 | Temp 98.0°F | Resp 16

## 2015-05-28 DIAGNOSIS — D509 Iron deficiency anemia, unspecified: Secondary | ICD-10-CM | POA: Diagnosis present

## 2015-05-28 DIAGNOSIS — K922 Gastrointestinal hemorrhage, unspecified: Secondary | ICD-10-CM

## 2015-05-28 MED ORDER — SODIUM CHLORIDE 0.9 % IV SOLN
510.0000 mg | Freq: Once | INTRAVENOUS | Status: DC
  Filled 2015-05-28: qty 17

## 2015-05-28 MED ORDER — SODIUM CHLORIDE 0.9 % IV SOLN
Freq: Once | INTRAVENOUS | Status: AC
  Administered 2015-05-28: 14:00:00 via INTRAVENOUS

## 2015-05-28 MED ORDER — SODIUM CHLORIDE 0.9 % IV SOLN
510.0000 mg | Freq: Once | INTRAVENOUS | Status: AC
  Administered 2015-05-28: 510 mg via INTRAVENOUS
  Filled 2015-05-28: qty 17

## 2015-05-28 NOTE — Patient Instructions (Signed)

## 2015-06-04 ENCOUNTER — Ambulatory Visit (HOSPITAL_BASED_OUTPATIENT_CLINIC_OR_DEPARTMENT_OTHER): Payer: Medicare Other

## 2015-06-04 VITALS — BP 120/63 | HR 85 | Temp 98.0°F | Resp 18

## 2015-06-04 DIAGNOSIS — K922 Gastrointestinal hemorrhage, unspecified: Secondary | ICD-10-CM

## 2015-06-04 DIAGNOSIS — D509 Iron deficiency anemia, unspecified: Secondary | ICD-10-CM

## 2015-06-04 MED ORDER — SODIUM CHLORIDE 0.9 % IV SOLN
510.0000 mg | Freq: Once | INTRAVENOUS | Status: AC
  Administered 2015-06-04: 510 mg via INTRAVENOUS
  Filled 2015-06-04: qty 17

## 2015-06-04 NOTE — Patient Instructions (Signed)

## 2015-06-10 DIAGNOSIS — E890 Postprocedural hypothyroidism: Secondary | ICD-10-CM | POA: Diagnosis not present

## 2015-06-10 DIAGNOSIS — Z85850 Personal history of malignant neoplasm of thyroid: Secondary | ICD-10-CM | POA: Diagnosis not present

## 2015-06-21 ENCOUNTER — Other Ambulatory Visit (HOSPITAL_BASED_OUTPATIENT_CLINIC_OR_DEPARTMENT_OTHER): Payer: Medicare Other

## 2015-06-21 ENCOUNTER — Telehealth: Payer: Self-pay | Admitting: *Deleted

## 2015-06-21 ENCOUNTER — Other Ambulatory Visit: Payer: Self-pay | Admitting: *Deleted

## 2015-06-21 DIAGNOSIS — D509 Iron deficiency anemia, unspecified: Secondary | ICD-10-CM | POA: Diagnosis present

## 2015-06-21 LAB — CBC & DIFF AND RETIC
BASO%: 0.6 % (ref 0.0–2.0)
Basophils Absolute: 0 10*3/uL (ref 0.0–0.1)
EOS%: 3.5 % (ref 0.0–7.0)
Eosinophils Absolute: 0.2 10*3/uL (ref 0.0–0.5)
HCT: 38.6 % (ref 34.8–46.6)
HGB: 12.2 g/dL (ref 11.6–15.9)
Immature Retic Fract: 10.2 % — ABNORMAL HIGH (ref 1.60–10.00)
LYMPH%: 31.2 % (ref 14.0–49.7)
MCH: 30.3 pg (ref 25.1–34.0)
MCHC: 31.6 g/dL (ref 31.5–36.0)
MCV: 96 fL (ref 79.5–101.0)
MONO#: 0.6 10*3/uL (ref 0.1–0.9)
MONO%: 9.6 % (ref 0.0–14.0)
NEUT#: 3.6 10*3/uL (ref 1.5–6.5)
NEUT%: 55.1 % (ref 38.4–76.8)
Platelets: 254 10*3/uL (ref 145–400)
RBC: 4.02 10*6/uL (ref 3.70–5.45)
RDW: 15.8 % — ABNORMAL HIGH (ref 11.2–14.5)
Retic %: 2.39 % — ABNORMAL HIGH (ref 0.70–2.10)
Retic Ct Abs: 96.08 10*3/uL — ABNORMAL HIGH (ref 33.70–90.70)
WBC: 6.5 10*3/uL (ref 3.9–10.3)
lymph#: 2 10*3/uL (ref 0.9–3.3)

## 2015-06-21 LAB — FERRITIN CHCC: Ferritin: 207 ng/ml (ref 9–269)

## 2015-06-21 LAB — HOLD TUBE, BLOOD BANK

## 2015-06-21 NOTE — Telephone Encounter (Signed)
-----   Message from [name], MD sent at 06/21/2015  2:55 PM EDT ----- Regarding: labs are good I recommend cancelling her appt this week and reschedule to 2 months If OK, I will place POF

## 2015-06-21 NOTE — Telephone Encounter (Signed)
Informed pt of lab results and no need to keep appt this week.  Dr. Alvy Bimler recommends she return in 2 months with lab a few days before.  Pt agreed.  She will expect call from [place].

## 2015-06-22 ENCOUNTER — Telehealth: Payer: Self-pay | Admitting: Hematology and Oncology

## 2015-06-22 ENCOUNTER — Other Ambulatory Visit: Payer: Self-pay | Admitting: Hematology and Oncology

## 2015-06-22 NOTE — Telephone Encounter (Signed)
s.w. pt and advised on NOV appt....pt ok and aware

## 2015-06-24 ENCOUNTER — Ambulatory Visit: Payer: Medicare Other

## 2015-06-24 ENCOUNTER — Ambulatory Visit: Payer: Medicare Other | Admitting: Hematology and Oncology

## 2015-06-29 DIAGNOSIS — Z85850 Personal history of malignant neoplasm of thyroid: Secondary | ICD-10-CM | POA: Diagnosis not present

## 2015-06-29 DIAGNOSIS — K449 Diaphragmatic hernia without obstruction or gangrene: Secondary | ICD-10-CM | POA: Diagnosis not present

## 2015-06-29 DIAGNOSIS — J45909 Unspecified asthma, uncomplicated: Secondary | ICD-10-CM | POA: Diagnosis not present

## 2015-06-29 DIAGNOSIS — Z23 Encounter for immunization: Secondary | ICD-10-CM | POA: Diagnosis not present

## 2015-06-29 DIAGNOSIS — J449 Chronic obstructive pulmonary disease, unspecified: Secondary | ICD-10-CM | POA: Diagnosis not present

## 2015-06-29 DIAGNOSIS — I10 Essential (primary) hypertension: Secondary | ICD-10-CM | POA: Diagnosis not present

## 2015-06-29 DIAGNOSIS — D509 Iron deficiency anemia, unspecified: Secondary | ICD-10-CM | POA: Diagnosis not present

## 2015-06-29 DIAGNOSIS — K219 Gastro-esophageal reflux disease without esophagitis: Secondary | ICD-10-CM | POA: Diagnosis not present

## 2015-06-29 DIAGNOSIS — E890 Postprocedural hypothyroidism: Secondary | ICD-10-CM | POA: Diagnosis not present

## 2015-06-29 DIAGNOSIS — Z6839 Body mass index (BMI) 39.0-39.9, adult: Secondary | ICD-10-CM | POA: Diagnosis not present

## 2015-07-01 ENCOUNTER — Ambulatory Visit: Payer: Medicare Other

## 2015-07-06 ENCOUNTER — Telehealth: Payer: Self-pay | Admitting: Hematology and Oncology

## 2015-07-06 NOTE — Telephone Encounter (Signed)
Faxed pt medical records to [place]

## 2015-08-13 ENCOUNTER — Other Ambulatory Visit: Payer: Self-pay | Admitting: Hematology and Oncology

## 2015-08-16 ENCOUNTER — Other Ambulatory Visit: Payer: Self-pay | Admitting: Hematology and Oncology

## 2015-08-16 ENCOUNTER — Other Ambulatory Visit (HOSPITAL_BASED_OUTPATIENT_CLINIC_OR_DEPARTMENT_OTHER): Payer: Medicare Other

## 2015-08-16 ENCOUNTER — Other Ambulatory Visit: Payer: Medicare Other

## 2015-08-16 DIAGNOSIS — D509 Iron deficiency anemia, unspecified: Secondary | ICD-10-CM | POA: Diagnosis present

## 2015-08-16 LAB — CBC & DIFF AND RETIC
BASO%: 0.7 % (ref 0.0–2.0)
BASOS ABS: 0.1 10*3/uL (ref 0.0–0.1)
EOS ABS: 0.2 10*3/uL (ref 0.0–0.5)
EOS%: 3.5 % (ref 0.0–7.0)
HEMATOCRIT: 38.9 % (ref 34.8–46.6)
HEMOGLOBIN: 12.1 g/dL (ref 11.6–15.9)
Immature Retic Fract: 12.7 % — ABNORMAL HIGH (ref 1.60–10.00)
LYMPH%: 31.7 % (ref 14.0–49.7)
MCH: 28.3 pg (ref 25.1–34.0)
MCHC: 31.1 g/dL — ABNORMAL LOW (ref 31.5–36.0)
MCV: 91.1 fL (ref 79.5–101.0)
MONO#: 0.7 10*3/uL (ref 0.1–0.9)
MONO%: 9.4 % (ref 0.0–14.0)
NEUT#: 3.8 10*3/uL (ref 1.5–6.5)
NEUT%: 54.7 % (ref 38.4–76.8)
Platelets: 263 10*3/uL (ref 145–400)
RBC: 4.27 10*6/uL (ref 3.70–5.45)
RDW: 14.8 % — AB (ref 11.2–14.5)
Retic %: 1.5 % (ref 0.70–2.10)
Retic Ct Abs: 64.05 10*3/uL (ref 33.70–90.70)
WBC: 6.9 10*3/uL (ref 3.9–10.3)
lymph#: 2.2 10*3/uL (ref 0.9–3.3)

## 2015-08-16 LAB — FERRITIN CHCC: Ferritin: 19 ng/ml (ref 9–269)

## 2015-08-16 LAB — HOLD TUBE, BLOOD BANK

## 2015-08-23 ENCOUNTER — Other Ambulatory Visit: Payer: Self-pay | Admitting: Hematology and Oncology

## 2015-08-23 ENCOUNTER — Encounter: Payer: Self-pay | Admitting: Hematology and Oncology

## 2015-08-23 ENCOUNTER — Ambulatory Visit (HOSPITAL_BASED_OUTPATIENT_CLINIC_OR_DEPARTMENT_OTHER): Payer: Medicare Other | Admitting: Hematology and Oncology

## 2015-08-23 ENCOUNTER — Telehealth: Payer: Self-pay | Admitting: *Deleted

## 2015-08-23 ENCOUNTER — Ambulatory Visit (HOSPITAL_BASED_OUTPATIENT_CLINIC_OR_DEPARTMENT_OTHER): Payer: Medicare Other

## 2015-08-23 VITALS — BP 156/80 | HR 84 | Temp 97.9°F | Resp 18

## 2015-08-23 VITALS — BP 170/64 | HR 76 | Temp 97.8°F | Resp 18 | Ht 63.0 in | Wt 227.7 lb

## 2015-08-23 DIAGNOSIS — D509 Iron deficiency anemia, unspecified: Secondary | ICD-10-CM

## 2015-08-23 DIAGNOSIS — I10 Essential (primary) hypertension: Secondary | ICD-10-CM | POA: Diagnosis not present

## 2015-08-23 MED ORDER — SODIUM CHLORIDE 0.9 % IV SOLN
Freq: Once | INTRAVENOUS | Status: AC
  Administered 2015-08-23: 12:00:00 via INTRAVENOUS

## 2015-08-23 MED ORDER — SODIUM CHLORIDE 0.9 % IV SOLN
510.0000 mg | Freq: Once | INTRAVENOUS | Status: AC
  Administered 2015-08-23: 510 mg via INTRAVENOUS
  Filled 2015-08-23: qty 17

## 2015-08-23 NOTE — Progress Notes (Signed)
[name]  Angelica Hatchet, MD SUMMARY OF HEMATOLOGIC HISTORY:  This patient has been anemic for many years. I was able to review her CBC from 2010 and noted that she had progressive microcytic anemia. In 2013, she has severe anemia requiring blood transfusion. She underwent EGD and colonoscopy in 2013 which only showed a large hiatal hernia but no evidence of ulcer or source of bleeding. She had significant pica with excessive chewing of ice. Her last colonoscopy in 2013 was negative She received intravenous iron infusion on 11/26/2013, 04/09/2014 and 08/24/14.  She also received blood transfusion on 04/09/2014 and 08/19/14 for severe anemia. From January 2016 to June 2016, she needed iron infusion on a monthly basis. On 03/05/2015, she received 1 unit of blood. On 03/12/2015, colonoscopy showed no evidence of active bleeding or hemorrhoids.  from June to September 2016, she received multiple doses of IV iron INTERVAL HISTORY: Angelica [name] 74 y.o. female returns for  Further follow-up. She complained of fatigue. She does not tolerate oral iron supplement at all.  she denies pica. The patient denies any recent signs or symptoms of bleeding such as spontaneous epistaxis, hematuria or hematochezia.   I have reviewed the past medical history, past surgical history, social history and family history with the patient and they are unchanged from previous note.  ALLERGIES:  is allergic to other; fosamax; penicillins; and sulfa antibiotics.  MEDICATIONS:  Current Outpatient Prescriptions  Medication Sig Dispense Refill  . amLODipine (NORVASC) 2.5 MG tablet Take 2.5 mg by mouth daily.    . diazepam (VALIUM) 5 MG tablet Take 5 mg by mouth every 6 (six) hours as needed for anxiety.    Marland Kitchen levothyroxine (SYNTHROID, LEVOTHROID) 125 MCG tablet Take 125 mcg by mouth daily.    . pantoprazole sodium (PROTONIX) 40 mg/20 mL PACK Place 20 mLs (40 mg total) into  feeding tube 2 (two) times daily. 60 each 3   No current facility-administered medications for this visit.   Facility-Administered Medications Ordered in Other Visits  Medication Dose Route Frequency Provider Last Rate Last Dose  . ferumoxytol (FERAHEME) 510 mg in sodium chloride 0.9 % 100 mL IVPB  510 mg Intravenous Once [name], MD   510 mg at 08/23/15 1212     REVIEW OF SYSTEMS:   Constitutional: Denies fevers, chills or night sweats Eyes: Denies blurriness of vision Ears, nose, mouth, throat, and face: Denies mucositis or sore throat Respiratory: Denies cough, dyspnea or wheezes Cardiovascular: Denies palpitation, chest discomfort or lower extremity swelling Gastrointestinal:  Denies nausea, heartburn or change in bowel habits Skin: Denies abnormal skin rashes Lymphatics: Denies new lymphadenopathy or easy bruising Neurological:Denies numbness, tingling or new weaknesses Behavioral/Psych: Mood is stable, no new changes  All other systems were reviewed with the patient and are negative.  PHYSICAL EXAMINATION: ECOG PERFORMANCE STATUS: 0 - Asymptomatic  Filed Vitals:   08/23/15 1050  BP: 170/64  Pulse: 76  Temp: 97.8 F (36.6 C)  Resp: 18   Filed Weights   08/23/15 1050  Weight: 227 lb 11.2 oz (103.284 kg)    GENERAL:alert, no distress and comfortable SKIN: skin color, texture, turgor are normal, no rashes or significant lesions EYES: normal, Conjunctiva are pink and non-injected, sclera clear Musculoskeletal:no cyanosis of digits and no clubbing  NEURO: alert & oriented x 3 with fluent speech, no focal motor/sensory deficits  LABORATORY DATA:  I have reviewed the data as listed No results found for this or any previous  visit (from the past 48 hour(s)).  Lab Results  Component Value Date   WBC 6.9 08/16/2015   HGB 12.1 08/16/2015   HCT 38.9 08/16/2015   MCV 91.1 08/16/2015   PLT 263 08/16/2015    ASSESSMENT & PLAN:  Iron deficiency anemia This is  multifactorial, likely due to ongoing GI bleed  Technically she is not anemic today, her ferritin level dropped by almost 100 points since last iron infusion. If I do not give her IV iron today, chances are she will become anemic very soon. She had EGD and colonoscopy which showed no overt bleeding. We discussed some of the risks, benefits, and alternatives of intravenous iron infusions. The patient is symptomatic from anemia and the iron level is critically low. She tolerated oral iron supplement poorly and desires to achieved higher levels of iron faster for adequate hematopoesis. Some of the side-effects to be expected including risks of infusion reactions, phlebitis, headaches, nausea and fatigue.  The patient is willing to proceed. Patient education material was dispensed.  Goal is to keep ferritin level greater than 50  I plan to give her only one dose of IV iron and then stop as she feels well. I will recheck her blood count and see her back in 2 months  Essential hypertension Her blood pressure is high which is suspect is due to anxiety. I will monitor for that carefully.       All questions were answered. The patient knows to call the clinic with any problems, questions or concerns. No barriers to learning was detected.  I spent 15 minutes counseling the patient face to face. The total time spent in the appointment was 20 minutes and more than 50% was on counseling.     [facility], [place], MD 11/28/201612:23 PM

## 2015-08-23 NOTE — Telephone Encounter (Signed)
Per staff message and POF I have scheduled appts. Advised scheduler of appts. JMW  

## 2015-08-23 NOTE — Assessment & Plan Note (Addendum)
This is multifactorial, likely due to ongoing GI bleed  Technically she is not anemic today, her ferritin level dropped by almost 100 points since last iron infusion. If I do not give her IV iron today, chances are she will become anemic very soon. She had EGD and colonoscopy which showed no overt bleeding. We discussed some of the risks, benefits, and alternatives of intravenous iron infusions. The patient is symptomatic from anemia and the iron level is critically low. She tolerated oral iron supplement poorly and desires to achieved higher levels of iron faster for adequate hematopoesis. Some of the side-effects to be expected including risks of infusion reactions, phlebitis, headaches, nausea and fatigue.  The patient is willing to proceed. Patient education material was dispensed.  Goal is to keep ferritin level greater than 50  I plan to give her only one dose of IV iron and then stop as she feels well. I will recheck her blood count and see her back in 2 months

## 2015-08-23 NOTE — Patient Instructions (Signed)
Ferumoxytol injection °What is this medicine? °FERUMOXYTOL is an iron complex. Iron is used to make healthy red blood cells, which carry oxygen and nutrients throughout the body. This medicine is used to treat iron deficiency anemia in people with chronic kidney disease. °This medicine may be used for other purposes; ask your health care provider or pharmacist if you have questions. °What should I tell my health care provider before I take this medicine? °They need to know if you have any of these conditions: °-anemia not caused by low iron levels °-high levels of iron in the blood °-magnetic resonance imaging (MRI) test scheduled °-an unusual or allergic reaction to iron, other medicines, foods, dyes, or preservatives °-pregnant or trying to get pregnant °-breast-feeding °How should I use this medicine? °This medicine is for injection into a vein. It is given by a health care professional in a hospital or clinic setting. °Talk to your pediatrician regarding the use of this medicine in children. Special care may be needed. °Overdosage: If you think you have taken too much of this medicine contact a poison control center or emergency room at once. °NOTE: This medicine is only for you. Do not share this medicine with others. °What if I miss a dose? °It is important not to miss your dose. Call your doctor or health care professional if you are unable to keep an appointment. °What may interact with this medicine? °This medicine may interact with the following medications: °-other iron products °This list may not describe all possible interactions. Give your health care provider a list of all the medicines, herbs, non-prescription drugs, or dietary supplements you use. Also tell them if you smoke, drink alcohol, or use illegal drugs. Some items may interact with your medicine. °What should I watch for while using this medicine? °Visit your doctor or healthcare professional regularly. Tell your doctor or healthcare  professional if your symptoms do not start to get better or if they get worse. You may need blood work done while you are taking this medicine. °You may need to follow a special diet. Talk to your doctor. Foods that contain iron include: whole grains/cereals, dried fruits, beans, or peas, leafy green vegetables, and organ meats (liver, kidney). °What side effects may I notice from receiving this medicine? °Side effects that you should report to your doctor or health care professional as soon as possible: °-allergic reactions like skin rash, itching or hives, swelling of the face, lips, or tongue °-breathing problems °-changes in blood pressure °-feeling faint or lightheaded, falls °-fever or chills °-flushing, sweating, or hot feelings °-swelling of the ankles or feet °Side effects that usually do not require medical attention (Report these to your doctor or health care professional if they continue or are bothersome.): °-diarrhea °-headache °-nausea, vomiting °-stomach pain °This list may not describe all possible side effects. Call your doctor for medical advice about side effects. You may report side effects to FDA at 1-800-FDA-1088. °Where should I keep my medicine? °This drug is given in a hospital or clinic and will not be stored at home. °NOTE: This sheet is a summary. It may not cover all possible information. If you have questions about this medicine, talk to your doctor, pharmacist, or health care provider. °  °© 2016, Elsevier/Gold Standard. (2012-04-26 15:23:36) ° °Anemia, Nonspecific °Anemia is a condition in which the concentration of red blood cells or hemoglobin in the blood is below normal. Hemoglobin is a substance in red blood cells that carries oxygen to the tissues   of the body. Anemia results in not enough oxygen reaching these tissues.  °CAUSES  °Common causes of anemia include:  °· Excessive bleeding. Bleeding may be internal or external. This includes excessive bleeding from periods (in  women) or from the intestine.   °· Poor nutrition.   °· Chronic kidney, thyroid, and liver disease.  °· Bone marrow disorders that decrease red blood cell production. °· Cancer and treatments for cancer. °· HIV, AIDS, and their treatments. °· Spleen problems that increase red blood cell destruction. °· Blood disorders. °· Excess destruction of red blood cells due to infection, medicines, and autoimmune disorders. °SIGNS AND SYMPTOMS  °· Minor weakness.   °· Dizziness.   °· Headache. °· Palpitations.   °· Shortness of breath, especially with exercise.   °· Paleness. °· Cold sensitivity. °· Indigestion. °· Nausea. °· Difficulty sleeping. °· Difficulty concentrating. °Symptoms may occur suddenly or they may develop slowly.  °DIAGNOSIS  °Additional blood tests are often needed. These help your health care provider determine the best treatment. Your health care provider will check your stool for blood and look for other causes of blood loss.  °TREATMENT  °Treatment varies depending on the cause of the anemia. Treatment can include:  °· Supplements of iron, vitamin B12, or folic acid.   °· Hormone medicines.   °· A blood transfusion. This may be needed if blood loss is severe.   °· Hospitalization. This may be needed if there is significant continual blood loss.   °· Dietary changes. °· Spleen removal. °HOME CARE INSTRUCTIONS °Keep all follow-up appointments. It often takes many weeks to correct anemia, and having your health care provider check on your condition and your response to treatment is very important. °SEEK IMMEDIATE MEDICAL CARE IF:  °· You develop extreme weakness, shortness of breath, or chest pain.   °· You become dizzy or have trouble concentrating. °· You develop heavy vaginal bleeding.   °· You develop a rash.   °· You have bloody or black, tarry stools.   °· You faint.   °· You vomit up blood.   °· You vomit repeatedly.   °· You have abdominal pain. °· You have a fever or persistent symptoms for more  than 2-3 days.   °· You have a fever and your symptoms suddenly get worse.   °· You are dehydrated.   °MAKE SURE YOU: °· Understand these instructions. °· Will watch your condition. °· Will get help right away if you are not doing well or get worse. °  °This information is not intended to replace advice given to you by your health care provider. Make sure you discuss any questions you have with your health care provider. °  °Document Released: 10/19/2004 Document Revised: 05/14/2013 Document Reviewed: 03/07/2013 °Elsevier Interactive Patient Education ©2016 Elsevier Inc. ° ° °

## 2015-08-23 NOTE — Assessment & Plan Note (Signed)
Her blood pressure is high which is suspect is due to anxiety. I will monitor for that carefully.

## 2015-08-27 ENCOUNTER — Telehealth: Payer: Self-pay | Admitting: Hematology

## 2015-08-27 NOTE — Telephone Encounter (Signed)
Appointments complete per 11/28 pof. Called patient to confirmed and was unable to reach her. Schedule mailed for January and february. Also added an additional tx for 2/14 as fera is done in 2 doses 1 week apart.

## 2015-08-30 ENCOUNTER — Ambulatory Visit: Payer: Medicare Other

## 2015-09-15 ENCOUNTER — Other Ambulatory Visit: Payer: Self-pay | Admitting: Hematology and Oncology

## 2015-09-21 DIAGNOSIS — I10 Essential (primary) hypertension: Secondary | ICD-10-CM | POA: Diagnosis not present

## 2015-09-21 DIAGNOSIS — E890 Postprocedural hypothyroidism: Secondary | ICD-10-CM | POA: Diagnosis not present

## 2015-09-30 DIAGNOSIS — E559 Vitamin D deficiency, unspecified: Secondary | ICD-10-CM | POA: Diagnosis not present

## 2015-09-30 DIAGNOSIS — R5383 Other fatigue: Secondary | ICD-10-CM | POA: Diagnosis not present

## 2015-09-30 DIAGNOSIS — J3489 Other specified disorders of nose and nasal sinuses: Secondary | ICD-10-CM | POA: Diagnosis not present

## 2015-09-30 DIAGNOSIS — B079 Viral wart, unspecified: Secondary | ICD-10-CM | POA: Diagnosis not present

## 2015-09-30 DIAGNOSIS — R739 Hyperglycemia, unspecified: Secondary | ICD-10-CM | POA: Diagnosis not present

## 2015-09-30 DIAGNOSIS — M549 Dorsalgia, unspecified: Secondary | ICD-10-CM | POA: Diagnosis not present

## 2015-09-30 DIAGNOSIS — E538 Deficiency of other specified B group vitamins: Secondary | ICD-10-CM | POA: Diagnosis not present

## 2015-09-30 DIAGNOSIS — J449 Chronic obstructive pulmonary disease, unspecified: Secondary | ICD-10-CM | POA: Diagnosis not present

## 2015-09-30 DIAGNOSIS — Z1389 Encounter for screening for other disorder: Secondary | ICD-10-CM | POA: Diagnosis not present

## 2015-09-30 DIAGNOSIS — E890 Postprocedural hypothyroidism: Secondary | ICD-10-CM | POA: Diagnosis not present

## 2015-09-30 DIAGNOSIS — I10 Essential (primary) hypertension: Secondary | ICD-10-CM | POA: Diagnosis not present

## 2015-09-30 DIAGNOSIS — D509 Iron deficiency anemia, unspecified: Secondary | ICD-10-CM | POA: Diagnosis not present

## 2015-09-30 DIAGNOSIS — Z6841 Body Mass Index (BMI) 40.0 and over, adult: Secondary | ICD-10-CM | POA: Diagnosis not present

## 2015-10-06 ENCOUNTER — Telehealth: Payer: Self-pay | Admitting: *Deleted

## 2015-10-06 ENCOUNTER — Other Ambulatory Visit: Payer: Self-pay | Admitting: Hematology and Oncology

## 2015-10-06 NOTE — Telephone Encounter (Signed)
Per POF I have called and spoke with the patient. I gave her appt time for tomorrow.

## 2015-10-06 NOTE — Telephone Encounter (Signed)
Pt left VM states she feels like her blood is low. Would like to be checked.

## 2015-10-06 NOTE — Telephone Encounter (Signed)
I placed POF for labs tomorrow Will call her once results are available to see if she needs IV iron

## 2015-10-07 ENCOUNTER — Other Ambulatory Visit: Payer: Self-pay | Admitting: Hematology and Oncology

## 2015-10-07 ENCOUNTER — Telehealth: Payer: Self-pay | Admitting: *Deleted

## 2015-10-07 ENCOUNTER — Other Ambulatory Visit (HOSPITAL_BASED_OUTPATIENT_CLINIC_OR_DEPARTMENT_OTHER): Payer: Medicare Other

## 2015-10-07 DIAGNOSIS — D509 Iron deficiency anemia, unspecified: Secondary | ICD-10-CM

## 2015-10-07 DIAGNOSIS — E538 Deficiency of other specified B group vitamins: Secondary | ICD-10-CM | POA: Diagnosis not present

## 2015-10-07 LAB — CBC & DIFF AND RETIC
BASO%: 0.4 % (ref 0.0–2.0)
Basophils Absolute: 0 10*3/uL (ref 0.0–0.1)
EOS ABS: 0.2 10*3/uL (ref 0.0–0.5)
EOS%: 3.1 % (ref 0.0–7.0)
HEMATOCRIT: 31 % — AB (ref 34.8–46.6)
HGB: 9.4 g/dL — ABNORMAL LOW (ref 11.6–15.9)
IMMATURE RETIC FRACT: 24.7 % — AB (ref 1.60–10.00)
LYMPH%: 29.9 % (ref 14.0–49.7)
MCH: 26.6 pg (ref 25.1–34.0)
MCHC: 30.3 g/dL — ABNORMAL LOW (ref 31.5–36.0)
MCV: 87.6 fL (ref 79.5–101.0)
MONO#: 0.7 10*3/uL (ref 0.1–0.9)
MONO%: 10.3 % (ref 0.0–14.0)
NEUT#: 4.1 10*3/uL (ref 1.5–6.5)
NEUT%: 56.3 % (ref 38.4–76.8)
NRBC: 0 % (ref 0–0)
Platelets: 301 10*3/uL (ref 145–400)
RBC: 3.54 10*6/uL — ABNORMAL LOW (ref 3.70–5.45)
RDW: 16.2 % — AB (ref 11.2–14.5)
Retic %: 2.64 % — ABNORMAL HIGH (ref 0.70–2.10)
Retic Ct Abs: 93.46 10*3/uL — ABNORMAL HIGH (ref 33.70–90.70)
WBC: 7.2 10*3/uL (ref 3.9–10.3)
lymph#: 2.2 10*3/uL (ref 0.9–3.3)

## 2015-10-07 LAB — FERRITIN: FERRITIN: 17 ng/mL (ref 9–269)

## 2015-10-07 NOTE — Telephone Encounter (Signed)
Per staff message and POF I have scheduled appts. Advised scheduler of appts. JMW  

## 2015-10-11 NOTE — Telephone Encounter (Signed)
Spoke with patient to confirm 1/19 appointment - patient to get new schedule 1/19.

## 2015-10-14 ENCOUNTER — Ambulatory Visit (HOSPITAL_BASED_OUTPATIENT_CLINIC_OR_DEPARTMENT_OTHER): Payer: Medicare Other | Admitting: Hematology and Oncology

## 2015-10-14 ENCOUNTER — Ambulatory Visit (HOSPITAL_BASED_OUTPATIENT_CLINIC_OR_DEPARTMENT_OTHER): Payer: Medicare Other

## 2015-10-14 ENCOUNTER — Encounter: Payer: Self-pay | Admitting: Hematology and Oncology

## 2015-10-14 ENCOUNTER — Telehealth: Payer: Self-pay | Admitting: Hematology and Oncology

## 2015-10-14 VITALS — BP 135/70 | HR 79 | Temp 98.0°F | Resp 18 | Ht 63.0 in | Wt 231.3 lb

## 2015-10-14 DIAGNOSIS — D509 Iron deficiency anemia, unspecified: Secondary | ICD-10-CM

## 2015-10-14 MED ORDER — SODIUM CHLORIDE 0.9 % IV SOLN
Freq: Once | INTRAVENOUS | Status: AC
  Administered 2015-10-14: 12:00:00 via INTRAVENOUS

## 2015-10-14 MED ORDER — SODIUM CHLORIDE 0.9 % IV SOLN
510.0000 mg | Freq: Once | INTRAVENOUS | Status: AC
  Administered 2015-10-14: 510 mg via INTRAVENOUS
  Filled 2015-10-14: qty 17

## 2015-10-14 NOTE — Patient Instructions (Signed)

## 2015-10-14 NOTE — Telephone Encounter (Signed)
per pof to sch pt appt-gave pt copy of avs °

## 2015-10-15 NOTE — Assessment & Plan Note (Signed)
This is multifactorial, likely due to ongoing GI bleed  She had EGD and colonoscopy which showed no overt bleeding. We discussed some of the risks, benefits, and alternatives of intravenous iron infusions. The patient is symptomatic from anemia and the iron level is critically low. She tolerated oral iron supplement poorly and desires to achieved higher levels of iron faster for adequate hematopoesis. Some of the side-effects to be expected including risks of infusion reactions, phlebitis, headaches, nausea and fatigue.  The patient is willing to proceed. Patient education material was dispensed.  Goal is to keep ferritin level greater than 50 I will recheck her blood count monthly She will continue to receive vitamin B-12 injection through her primary care physician office.

## 2015-10-15 NOTE — Progress Notes (Signed)
[place] NOTE  Angelica Hatchet, MD SUMMARY OF HEMATOLOGIC HISTORY:  This patient has been anemic for many years. I was able to review her CBC from 2010 and noted that she had progressive microcytic anemia. In 2013, she has severe anemia requiring blood transfusion. She underwent EGD and colonoscopy in 2013 which only showed a large hiatal hernia but no evidence of ulcer or source of bleeding. She had significant pica with excessive chewing of ice. Her last colonoscopy in 2013 was negative She received intravenous iron infusion on 11/26/2013, 04/09/2014 and 08/24/14.  She also received blood transfusion on 04/09/2014 and 08/19/14 for severe anemia. From January 2016 to June 2016, she needed iron infusion on a monthly basis. On 03/05/2015, she received 1 unit of blood. On 03/12/2015, colonoscopy showed no evidence of active bleeding or hemorrhoids.  from June to November 2016, she received multiple doses of IV iron INTERVAL HISTORY: Angelica [name] 75 y.o. female returns for  Further follow-up. She complained of fatigue. She does not tolerate oral iron supplement at all.  she has pica. The patient denies any recent signs or symptoms of bleeding such as spontaneous epistaxis, hematuria or hematochezia. Her primary care doctor has prescribed vitamin B-12 injection.  I have reviewed the past medical history, past surgical history, social history and family history with the patient and they are unchanged from previous note.  ALLERGIES:  is allergic to other; fosamax; penicillins; and sulfa antibiotics.  MEDICATIONS:  Current Outpatient Prescriptions  Medication Sig Dispense Refill  . amLODipine (NORVASC) 2.5 MG tablet Take 2.5 mg by mouth daily.    . diazepam (VALIUM) 5 MG tablet Take 5 mg by mouth every 6 (six) hours as needed for anxiety.    Marland Kitchen levothyroxine (SYNTHROID, LEVOTHROID) 125 MCG tablet Take 125 mcg by mouth daily.    . pantoprazole sodium  (PROTONIX) 40 mg/20 mL PACK Place 20 mLs (40 mg total) into feeding tube 2 (two) times daily. 60 each 3  . Vitamin D, Ergocalciferol, (DRISDOL) 50000 units CAPS capsule Take 50,000 Units by mouth every 7 (seven) days.     No current facility-administered medications for this visit.     REVIEW OF SYSTEMS:   Constitutional: Denies fevers, chills or night sweats Eyes: Denies blurriness of vision Ears, nose, mouth, throat, and face: Denies mucositis or sore throat Respiratory: Denies cough, dyspnea or wheezes Cardiovascular: Denies palpitation, chest discomfort or lower extremity swelling Gastrointestinal:  Denies nausea, heartburn or change in bowel habits Skin: Denies abnormal skin rashes Lymphatics: Denies new lymphadenopathy or easy bruising Neurological:Denies numbness, tingling or new weaknesses Behavioral/Psych: Mood is stable, no new changes  All other systems were reviewed with the patient and are negative.  PHYSICAL EXAMINATION: ECOG PERFORMANCE STATUS: 1 - Symptomatic but completely ambulatory  Filed Vitals:   10/14/15 0958  BP: 135/70  Pulse: 79  Temp: 98 F (36.7 C)  Resp: 18   Filed Weights   10/14/15 0958  Weight: 231 lb 4.8 oz (104.917 kg)    GENERAL:alert, no distress and comfortable. She is obese and looked pale SKIN: skin color, texture, turgor are normal, no rashes or significant lesions EYES: normal, Conjunctiva are pink and non-injected, sclera clear Musculoskeletal:no cyanosis of digits and no clubbing  NEURO: alert & oriented x 3 with fluent speech, no focal motor/sensory deficits  LABORATORY DATA:  I have reviewed the data as listed No results found for this or any previous visit (from the past 48 hour(s)).  Lab Results  Component Value Date   WBC 7.2 10/07/2015   HGB 9.4* 10/07/2015   HCT 31.0* 10/07/2015   MCV 87.6 10/07/2015   PLT 301 10/07/2015    ASSESSMENT & PLAN:  Iron deficiency anemia This is multifactorial, likely due to ongoing GI  bleed  She had EGD and colonoscopy which showed no overt bleeding. We discussed some of the risks, benefits, and alternatives of intravenous iron infusions. The patient is symptomatic from anemia and the iron level is critically low. She tolerated oral iron supplement poorly and desires to achieved higher levels of iron faster for adequate hematopoesis. Some of the side-effects to be expected including risks of infusion reactions, phlebitis, headaches, nausea and fatigue.  The patient is willing to proceed. Patient education material was dispensed.  Goal is to keep ferritin level greater than 50 I will recheck her blood count monthly She will continue to receive vitamin B-12 injection through her primary care physician office.   All questions were answered. The patient knows to call the clinic with any problems, questions or concerns. No barriers to learning was detected.  I spent 15 minutes counseling the patient face to face. The total time spent in the appointment was 20 minutes and more than 50% was on counseling.     Aydin Hink, MD 1/20/20178:50 AM

## 2015-10-21 ENCOUNTER — Ambulatory Visit (HOSPITAL_BASED_OUTPATIENT_CLINIC_OR_DEPARTMENT_OTHER): Payer: Medicare Other

## 2015-10-21 VITALS — BP 140/70 | HR 74 | Temp 98.3°F | Resp 26

## 2015-10-21 DIAGNOSIS — D509 Iron deficiency anemia, unspecified: Secondary | ICD-10-CM | POA: Diagnosis not present

## 2015-10-21 MED ORDER — SODIUM CHLORIDE 0.9 % IV SOLN
510.0000 mg | Freq: Once | INTRAVENOUS | Status: AC
  Administered 2015-10-21: 510 mg via INTRAVENOUS
  Filled 2015-10-21: qty 17

## 2015-10-21 MED ORDER — SODIUM CHLORIDE 0.9 % IV SOLN
Freq: Once | INTRAVENOUS | Status: AC
  Administered 2015-10-21: 11:00:00 via INTRAVENOUS

## 2015-10-26 ENCOUNTER — Other Ambulatory Visit: Payer: Medicare Other

## 2015-11-02 ENCOUNTER — Ambulatory Visit: Payer: Medicare Other | Admitting: Hematology and Oncology

## 2015-11-02 ENCOUNTER — Ambulatory Visit: Payer: Medicare Other

## 2015-11-02 DIAGNOSIS — Z780 Asymptomatic menopausal state: Secondary | ICD-10-CM | POA: Diagnosis not present

## 2015-11-02 DIAGNOSIS — E559 Vitamin D deficiency, unspecified: Secondary | ICD-10-CM | POA: Diagnosis not present

## 2015-11-02 DIAGNOSIS — E538 Deficiency of other specified B group vitamins: Secondary | ICD-10-CM | POA: Diagnosis not present

## 2015-11-08 DIAGNOSIS — D220 Melanocytic nevi of lip: Secondary | ICD-10-CM | POA: Diagnosis not present

## 2015-11-08 DIAGNOSIS — D3617 Benign neoplasm of peripheral nerves and autonomic nervous system of trunk, unspecified: Secondary | ICD-10-CM | POA: Diagnosis not present

## 2015-11-08 DIAGNOSIS — D3611 Benign neoplasm of peripheral nerves and autonomic nervous system of face, head, and neck: Secondary | ICD-10-CM | POA: Diagnosis not present

## 2015-11-08 DIAGNOSIS — L218 Other seborrheic dermatitis: Secondary | ICD-10-CM | POA: Diagnosis not present

## 2015-11-09 ENCOUNTER — Ambulatory Visit: Payer: Medicare Other

## 2015-11-10 ENCOUNTER — Other Ambulatory Visit (HOSPITAL_COMMUNITY): Payer: Self-pay

## 2015-11-11 ENCOUNTER — Ambulatory Visit (HOSPITAL_COMMUNITY)
Admission: RE | Admit: 2015-11-11 | Discharge: 2015-11-11 | Disposition: A | Discharge status: Home / Self Care | Payer: Medicare Other | Source: Ambulatory Visit | Attending: Internal Medicine | Admitting: Internal Medicine

## 2015-11-11 DIAGNOSIS — M810 Age-related osteoporosis without current pathological fracture: Secondary | ICD-10-CM | POA: Insufficient documentation

## 2015-11-11 MED ORDER — DENOSUMAB 60 MG/ML ~~LOC~~ SOLN
60.0000 mg | Freq: Once | SUBCUTANEOUS | Status: AC
  Administered 2015-11-11: 60 mg via SUBCUTANEOUS
  Filled 2015-11-11: qty 1

## 2015-11-11 NOTE — Discharge Instructions (Signed)
Denosumab injection  What is this medicine?  DENOSUMAB (den oh sue mab) slows bone breakdown. Prolia is used to treat osteoporosis in women after menopause and in men. Xgeva is used to prevent bone fractures and other bone problems caused by cancer bone metastases. Xgeva is also used to treat giant cell tumor of the bone.  This medicine may be used for other purposes; ask your health care provider or pharmacist if you have questions.  What should I tell my health care provider before I take this medicine?  They need to know if you have any of these conditions:  -dental disease  -eczema  -infection or history of infections  -kidney disease or on dialysis  -low blood calcium or vitamin D  -malabsorption syndrome  -scheduled to have surgery or tooth extraction  -taking medicine that contains denosumab  -thyroid or parathyroid disease  -an unusual reaction to denosumab, other medicines, foods, dyes, or preservatives  -pregnant or trying to get pregnant  -breast-feeding  How should I use this medicine?  This medicine is for injection under the skin. It is given by a health care professional in a hospital or clinic setting.  If you are getting Prolia, a special MedGuide will be given to you by the pharmacist with each prescription and refill. Be sure to read this information carefully each time.  For Prolia, talk to your pediatrician regarding the use of this medicine in children. Special care may be needed. For Xgeva, talk to your pediatrician regarding the use of this medicine in children. While this drug may be prescribed for children as young as 13 years for selected conditions, precautions do apply.  Overdosage: If you think you have taken too much of this medicine contact a poison control center or emergency room at once.  NOTE: This medicine is only for you. Do not share this medicine with others.  What if I miss a dose?  It is important not to miss your dose. Call your doctor or health care professional if you are  unable to keep an appointment.  What may interact with this medicine?  Do not take this medicine with any of the following medications:  -other medicines containing denosumab  This medicine may also interact with the following medications:  -medicines that suppress the immune system  -medicines that treat cancer  -steroid medicines like prednisone or cortisone  This list may not describe all possible interactions. Give your health care provider a list of all the medicines, herbs, non-prescription drugs, or dietary supplements you use. Also tell them if you smoke, drink alcohol, or use illegal drugs. Some items may interact with your medicine.  What should I watch for while using this medicine?  Visit your doctor or health care professional for regular checks on your progress. Your doctor or health care professional may order blood tests and other tests to see how you are doing.  Call your doctor or health care professional if you get a cold or other infection while receiving this medicine. Do not treat yourself. This medicine may decrease your body's ability to fight infection.  You should make sure you get enough calcium and vitamin D while you are taking this medicine, unless your doctor tells you not to. Discuss the foods you eat and the vitamins you take with your health care professional.  See your dentist regularly. Brush and floss your teeth as directed. Before you have any dental work done, tell your dentist you are receiving this medicine.  Do   not become pregnant while taking this medicine or for 5 months after stopping it. Women should inform their doctor if they wish to become pregnant or think they might be pregnant. There is a potential for serious side effects to an unborn child. Talk to your health care professional or pharmacist for more information.  What side effects may I notice from receiving this medicine?  Side effects that you should report to your doctor or health care professional as soon as  possible:  -allergic reactions like skin rash, itching or hives, swelling of the face, lips, or tongue  -breathing problems  -chest pain  -fast, irregular heartbeat  -feeling faint or lightheaded, falls  -fever, chills, or any other sign of infection  -muscle spasms, tightening, or twitches  -numbness or tingling  -skin blisters or bumps, or is dry, peels, or red  -slow healing or unexplained pain in the mouth or jaw  -unusual bleeding or bruising  Side effects that usually do not require medical attention (Report these to your doctor or health care professional if they continue or are bothersome.):  -muscle pain  -stomach upset, gas  This list may not describe all possible side effects. Call your doctor for medical advice about side effects. You may report side effects to FDA at 1-800-FDA-1088.  Where should I keep my medicine?  This medicine is only given in a clinic, doctor's office, or other health care setting and will not be stored at home.  NOTE: This sheet is a summary. It may not cover all possible information. If you have questions about this medicine, talk to your doctor, pharmacist, or health care provider.      2016, Elsevier/Gold Standard. (2012-03-11 12:37:47)

## 2015-11-12 ENCOUNTER — Other Ambulatory Visit: Payer: Self-pay | Admitting: Hematology and Oncology

## 2015-11-18 ENCOUNTER — Telehealth: Payer: Self-pay | Admitting: *Deleted

## 2015-11-18 ENCOUNTER — Other Ambulatory Visit (HOSPITAL_BASED_OUTPATIENT_CLINIC_OR_DEPARTMENT_OTHER): Payer: Medicare Other

## 2015-11-18 DIAGNOSIS — D509 Iron deficiency anemia, unspecified: Secondary | ICD-10-CM | POA: Diagnosis not present

## 2015-11-18 LAB — CBC & DIFF AND RETIC
BASO%: 0.5 % (ref 0.0–2.0)
BASOS ABS: 0 10*3/uL (ref 0.0–0.1)
EOS%: 3.4 % (ref 0.0–7.0)
Eosinophils Absolute: 0.2 10*3/uL (ref 0.0–0.5)
HEMATOCRIT: 42.6 % (ref 34.8–46.6)
HGB: 13.2 g/dL (ref 11.6–15.9)
Immature Retic Fract: 7 % (ref 1.60–10.00)
LYMPH%: 33.6 % (ref 14.0–49.7)
MCH: 27.8 pg (ref 25.1–34.0)
MCHC: 31 g/dL — AB (ref 31.5–36.0)
MCV: 89.7 fL (ref 79.5–101.0)
MONO#: 0.6 10*3/uL (ref 0.1–0.9)
MONO%: 11.2 % (ref 0.0–14.0)
NEUT#: 2.9 10*3/uL (ref 1.5–6.5)
NEUT%: 51.3 % (ref 38.4–76.8)
PLATELETS: 198 10*3/uL (ref 145–400)
RBC: 4.75 10*6/uL (ref 3.70–5.45)
RDW: 18.8 % — AB (ref 11.2–14.5)
RETIC %: 0.76 % (ref 0.70–2.10)
Retic Ct Abs: 36.1 10*3/uL (ref 33.70–90.70)
WBC: 5.6 10*3/uL (ref 3.9–10.3)
lymph#: 1.9 10*3/uL (ref 0.9–3.3)

## 2015-11-18 LAB — FERRITIN: Ferritin: 65 ng/ml (ref 9–269)

## 2015-11-18 NOTE — Telephone Encounter (Signed)
-----   Message from [name], MD sent at 11/18/2015  2:56 PM EST ----- Regarding: lab results Pls let her know labs are good and she is not anemic ----- Message -----    From: [facility] in [facility] Interface    Sent: 11/18/2015   1:37 PM      To: [name], MD

## 2015-11-18 NOTE — Telephone Encounter (Signed)
LVM for pt informing of lab results good.  Call us back if any questions.  Keep lab next month as scheduled.

## 2015-11-22 DIAGNOSIS — M25562 Pain in left knee: Secondary | ICD-10-CM | POA: Diagnosis not present

## 2015-11-22 DIAGNOSIS — M810 Age-related osteoporosis without current pathological fracture: Secondary | ICD-10-CM | POA: Diagnosis not present

## 2015-11-22 DIAGNOSIS — Z6841 Body Mass Index (BMI) 40.0 and over, adult: Secondary | ICD-10-CM | POA: Diagnosis not present

## 2015-11-29 DIAGNOSIS — M170 Bilateral primary osteoarthritis of knee: Secondary | ICD-10-CM | POA: Diagnosis not present

## 2015-11-29 DIAGNOSIS — M25562 Pain in left knee: Secondary | ICD-10-CM | POA: Diagnosis not present

## 2015-11-29 DIAGNOSIS — M25561 Pain in right knee: Secondary | ICD-10-CM | POA: Diagnosis not present

## 2015-12-09 DIAGNOSIS — E538 Deficiency of other specified B group vitamins: Secondary | ICD-10-CM | POA: Diagnosis not present

## 2015-12-16 ENCOUNTER — Telehealth: Payer: Self-pay | Admitting: *Deleted

## 2015-12-16 ENCOUNTER — Other Ambulatory Visit: Payer: Self-pay | Admitting: Hematology and Oncology

## 2015-12-16 ENCOUNTER — Other Ambulatory Visit (HOSPITAL_BASED_OUTPATIENT_CLINIC_OR_DEPARTMENT_OTHER): Payer: Medicare Other

## 2015-12-16 DIAGNOSIS — D509 Iron deficiency anemia, unspecified: Secondary | ICD-10-CM

## 2015-12-16 LAB — CBC & DIFF AND RETIC
BASO%: 0.8 % (ref 0.0–2.0)
Basophils Absolute: 0.1 10*3/uL (ref 0.0–0.1)
EOS%: 3.4 % (ref 0.0–7.0)
Eosinophils Absolute: 0.2 10*3/uL (ref 0.0–0.5)
HCT: 43.2 % (ref 34.8–46.6)
HGB: 13.6 g/dL (ref 11.6–15.9)
Immature Retic Fract: 5.8 % (ref 1.60–10.00)
LYMPH%: 32.9 % (ref 14.0–49.7)
MCH: 27.9 pg (ref 25.1–34.0)
MCHC: 31.5 g/dL (ref 31.5–36.0)
MCV: 88.5 fL (ref 79.5–101.0)
MONO#: 0.7 10*3/uL (ref 0.1–0.9)
MONO%: 10 % (ref 0.0–14.0)
NEUT%: 52.9 % (ref 38.4–76.8)
NEUTROS ABS: 3.5 10*3/uL (ref 1.5–6.5)
PLATELETS: 210 10*3/uL (ref 145–400)
RBC: 4.88 10*6/uL (ref 3.70–5.45)
RDW: 17.4 % — ABNORMAL HIGH (ref 11.2–14.5)
Retic %: 0.74 % (ref 0.70–2.10)
Retic Ct Abs: 36.11 10*3/uL (ref 33.70–90.70)
WBC: 6.5 10*3/uL (ref 3.9–10.3)
lymph#: 2.1 10*3/uL (ref 0.9–3.3)

## 2015-12-16 LAB — FERRITIN: FERRITIN: 33 ng/mL (ref 9–269)

## 2015-12-16 NOTE — Telephone Encounter (Signed)
-----   Message from [name], MD sent at 12/16/2015  3:29 PM EDT ----- Regarding: labs Even though ferritin is dropping, since she is not anemic, no Rx needed. I will add iv iron in her next lab draw and if hemoglobin is low she will get IV iron next month ----- Message -----    From: [facility] in [facility] Interface    Sent: 12/16/2015   1:51 PM      To: [name], MD

## 2015-12-16 NOTE — Telephone Encounter (Signed)
LVM for pt to return nurse's call regarding Dr. Gorsuch's message below.  

## 2015-12-17 ENCOUNTER — Telehealth: Payer: Self-pay | Admitting: *Deleted

## 2015-12-17 DIAGNOSIS — M25561 Pain in right knee: Secondary | ICD-10-CM | POA: Diagnosis not present

## 2015-12-17 DIAGNOSIS — M545 Low back pain: Secondary | ICD-10-CM | POA: Diagnosis not present

## 2015-12-17 DIAGNOSIS — M25562 Pain in left knee: Secondary | ICD-10-CM | POA: Diagnosis not present

## 2015-12-17 DIAGNOSIS — M170 Bilateral primary osteoarthritis of knee: Secondary | ICD-10-CM | POA: Diagnosis not present

## 2015-12-17 NOTE — Telephone Encounter (Signed)
Informed pt of Dr. Calton Dach message below.  She asks if she should call us if she starts feeling bad before her next appt?  Informed pt she can call us if she feels bad and we can check her CBC before next visit if needed.  She verbalized understanding.

## 2015-12-17 NOTE — Telephone Encounter (Signed)
-----   Message from Ni Gorsuch, MD sent at 12/16/2015  3:29 PM EDT ----- Regarding: labs Even though ferritin is dropping, since she is not anemic, no Rx needed. I will add iv iron in her next lab draw and if hemoglobin is low she will get IV iron next month ----- Message -----    From: [facility] in [facility] Interface    Sent: 12/16/2015   1:51 PM      To: Ni Gorsuch, MD    

## 2015-12-31 DIAGNOSIS — M810 Age-related osteoporosis without current pathological fracture: Secondary | ICD-10-CM | POA: Diagnosis not present

## 2015-12-31 DIAGNOSIS — M25561 Pain in right knee: Secondary | ICD-10-CM | POA: Diagnosis not present

## 2015-12-31 DIAGNOSIS — M11261 Other chondrocalcinosis, right knee: Secondary | ICD-10-CM | POA: Diagnosis not present

## 2016-01-12 DIAGNOSIS — E538 Deficiency of other specified B group vitamins: Secondary | ICD-10-CM | POA: Diagnosis not present

## 2016-01-20 ENCOUNTER — Other Ambulatory Visit (HOSPITAL_BASED_OUTPATIENT_CLINIC_OR_DEPARTMENT_OTHER): Payer: Medicare Other

## 2016-01-20 ENCOUNTER — Telehealth: Payer: Self-pay | Admitting: Hematology and Oncology

## 2016-01-20 ENCOUNTER — Ambulatory Visit (HOSPITAL_BASED_OUTPATIENT_CLINIC_OR_DEPARTMENT_OTHER): Payer: Medicare Other

## 2016-01-20 ENCOUNTER — Other Ambulatory Visit: Payer: Self-pay | Admitting: Hematology and Oncology

## 2016-01-20 VITALS — BP 147/77 | HR 72 | Temp 98.1°F | Resp 20

## 2016-01-20 DIAGNOSIS — D509 Iron deficiency anemia, unspecified: Secondary | ICD-10-CM

## 2016-01-20 LAB — CBC & DIFF AND RETIC
BASO%: 0.5 % (ref 0.0–2.0)
Basophils Absolute: 0 10*3/uL (ref 0.0–0.1)
EOS%: 3.1 % (ref 0.0–7.0)
Eosinophils Absolute: 0.2 10*3/uL (ref 0.0–0.5)
HCT: 43.1 % (ref 34.8–46.6)
HGB: 13.7 g/dL (ref 11.6–15.9)
IMMATURE RETIC FRACT: 6 % (ref 1.60–10.00)
LYMPH%: 37.4 % (ref 14.0–49.7)
MCH: 28 pg (ref 25.1–34.0)
MCHC: 31.8 g/dL (ref 31.5–36.0)
MCV: 88.1 fL (ref 79.5–101.0)
MONO#: 0.5 10*3/uL (ref 0.1–0.9)
MONO%: 8.1 % (ref 0.0–14.0)
NEUT#: 3.1 10*3/uL (ref 1.5–6.5)
NEUT%: 50.9 % (ref 38.4–76.8)
PLATELETS: 206 10*3/uL (ref 145–400)
RBC: 4.89 10*6/uL (ref 3.70–5.45)
RDW: 16.3 % — ABNORMAL HIGH (ref 11.2–14.5)
Retic %: 0.79 % (ref 0.70–2.10)
Retic Ct Abs: 38.63 10*3/uL (ref 33.70–90.70)
WBC: 6 10*3/uL (ref 3.9–10.3)
lymph#: 2.3 10*3/uL (ref 0.9–3.3)

## 2016-01-20 LAB — FERRITIN: Ferritin: 28 ng/ml (ref 9–269)

## 2016-01-20 MED ORDER — SODIUM CHLORIDE 0.9 % IV SOLN
Freq: Once | INTRAVENOUS | Status: AC
  Administered 2016-01-20: 14:00:00 via INTRAVENOUS

## 2016-01-20 MED ORDER — SODIUM CHLORIDE 0.9 % IV SOLN
510.0000 mg | Freq: Once | INTRAVENOUS | Status: AC
  Administered 2016-01-20: 510 mg via INTRAVENOUS
  Filled 2016-01-20: qty 17

## 2016-01-20 NOTE — Patient Instructions (Signed)

## 2016-01-20 NOTE — Telephone Encounter (Signed)
per pof to sch pt appt-cld pt w/appt time & date for 7/14 @

## 2016-01-21 ENCOUNTER — Telehealth: Payer: Self-pay | Admitting: *Deleted

## 2016-01-21 NOTE — Telephone Encounter (Signed)
Per staff message and POF I have scheduled appts. Advised scheduler of appts. JMW  

## 2016-01-21 NOTE — Telephone Encounter (Signed)
Pt notified-Labs good, cancel Dr Alvy Bimler. Return to see Dr Alvy Bimler in 3 months.

## 2016-01-24 DIAGNOSIS — M810 Age-related osteoporosis without current pathological fracture: Secondary | ICD-10-CM | POA: Diagnosis not present

## 2016-01-24 DIAGNOSIS — M545 Low back pain: Secondary | ICD-10-CM | POA: Diagnosis not present

## 2016-01-24 DIAGNOSIS — M25561 Pain in right knee: Secondary | ICD-10-CM | POA: Diagnosis not present

## 2016-01-28 ENCOUNTER — Ambulatory Visit: Payer: Medicare Other

## 2016-01-28 ENCOUNTER — Ambulatory Visit: Payer: Medicare Other | Admitting: Hematology and Oncology

## 2016-01-28 DIAGNOSIS — Z6841 Body Mass Index (BMI) 40.0 and over, adult: Secondary | ICD-10-CM | POA: Diagnosis not present

## 2016-01-28 DIAGNOSIS — I10 Essential (primary) hypertension: Secondary | ICD-10-CM | POA: Diagnosis not present

## 2016-02-07 DIAGNOSIS — M170 Bilateral primary osteoarthritis of knee: Secondary | ICD-10-CM | POA: Diagnosis not present

## 2016-02-11 DIAGNOSIS — M25562 Pain in left knee: Secondary | ICD-10-CM | POA: Diagnosis not present

## 2016-02-11 DIAGNOSIS — Z6841 Body Mass Index (BMI) 40.0 and over, adult: Secondary | ICD-10-CM | POA: Diagnosis not present

## 2016-02-11 DIAGNOSIS — E538 Deficiency of other specified B group vitamins: Secondary | ICD-10-CM | POA: Diagnosis not present

## 2016-02-11 DIAGNOSIS — I10 Essential (primary) hypertension: Secondary | ICD-10-CM | POA: Diagnosis not present

## 2016-02-11 DIAGNOSIS — E559 Vitamin D deficiency, unspecified: Secondary | ICD-10-CM | POA: Diagnosis not present

## 2016-02-24 ENCOUNTER — Other Ambulatory Visit: Payer: Self-pay | Admitting: Internal Medicine

## 2016-02-24 DIAGNOSIS — Z1231 Encounter for screening mammogram for malignant neoplasm of breast: Secondary | ICD-10-CM

## 2016-03-14 DIAGNOSIS — E538 Deficiency of other specified B group vitamins: Secondary | ICD-10-CM | POA: Diagnosis not present

## 2016-03-20 DIAGNOSIS — Z6841 Body Mass Index (BMI) 40.0 and over, adult: Secondary | ICD-10-CM | POA: Diagnosis not present

## 2016-03-20 DIAGNOSIS — I10 Essential (primary) hypertension: Secondary | ICD-10-CM | POA: Diagnosis not present

## 2016-03-20 DIAGNOSIS — D509 Iron deficiency anemia, unspecified: Secondary | ICD-10-CM | POA: Diagnosis not present

## 2016-03-20 DIAGNOSIS — F330 Major depressive disorder, recurrent, mild: Secondary | ICD-10-CM | POA: Diagnosis not present

## 2016-03-20 DIAGNOSIS — E538 Deficiency of other specified B group vitamins: Secondary | ICD-10-CM | POA: Diagnosis not present

## 2016-03-20 DIAGNOSIS — R5383 Other fatigue: Secondary | ICD-10-CM | POA: Diagnosis not present

## 2016-03-20 DIAGNOSIS — M810 Age-related osteoporosis without current pathological fracture: Secondary | ICD-10-CM | POA: Diagnosis not present

## 2016-03-31 ENCOUNTER — Ambulatory Visit
Admission: RE | Admit: 2016-03-31 | Discharge: 2016-03-31 | Disposition: A | Discharge status: Home / Self Care | Payer: Medicare Other | Source: Ambulatory Visit | Attending: Internal Medicine | Admitting: Internal Medicine

## 2016-03-31 DIAGNOSIS — Z1231 Encounter for screening mammogram for malignant neoplasm of breast: Secondary | ICD-10-CM | POA: Diagnosis not present

## 2016-04-07 ENCOUNTER — Other Ambulatory Visit (HOSPITAL_BASED_OUTPATIENT_CLINIC_OR_DEPARTMENT_OTHER): Payer: Medicare Other

## 2016-04-07 DIAGNOSIS — D509 Iron deficiency anemia, unspecified: Secondary | ICD-10-CM | POA: Diagnosis not present

## 2016-04-07 LAB — CBC & DIFF AND RETIC
BASO%: 0.7 % (ref 0.0–2.0)
Basophils Absolute: 0.1 10*3/uL (ref 0.0–0.1)
EOS%: 3.6 % (ref 0.0–7.0)
Eosinophils Absolute: 0.3 10*3/uL (ref 0.0–0.5)
HCT: 42.5 % (ref 34.8–46.6)
HGB: 13.8 g/dL (ref 11.6–15.9)
Immature Retic Fract: 3.1 % (ref 1.60–10.00)
LYMPH#: 2.7 10*3/uL (ref 0.9–3.3)
LYMPH%: 35.6 % (ref 14.0–49.7)
MCH: 29.6 pg (ref 25.1–34.0)
MCHC: 32.5 g/dL (ref 31.5–36.0)
MCV: 91.2 fL (ref 79.5–101.0)
MONO#: 0.7 10*3/uL (ref 0.1–0.9)
MONO%: 8.9 % (ref 0.0–14.0)
NEUT#: 3.9 10*3/uL (ref 1.5–6.5)
NEUT%: 51.2 % (ref 38.4–76.8)
PLATELETS: 201 10*3/uL (ref 145–400)
RBC: 4.66 10*6/uL (ref 3.70–5.45)
RDW: 15 % — AB (ref 11.2–14.5)
RETIC %: 1.3 % (ref 0.70–2.10)
RETIC CT ABS: 60.58 10*3/uL (ref 33.70–90.70)
WBC: 7.6 10*3/uL (ref 3.9–10.3)

## 2016-04-07 LAB — FERRITIN: Ferritin: 107 ng/ml (ref 9–269)

## 2016-04-10 ENCOUNTER — Other Ambulatory Visit: Payer: Self-pay | Admitting: Hematology and Oncology

## 2016-04-10 ENCOUNTER — Telehealth: Payer: Self-pay | Admitting: *Deleted

## 2016-04-10 DIAGNOSIS — D509 Iron deficiency anemia, unspecified: Secondary | ICD-10-CM

## 2016-04-10 NOTE — Telephone Encounter (Signed)
-----   Message from Cathlean Cower, RN sent at 04/07/2016  5:26 PM EDT ----- Regarding: FW: labs fine   ----- Message -----    From: [name], MD    Sent: 04/07/2016  12:23 PM      To: Cathlean Cower, RN Subject: labs fine                                      Her labs are still OK No anemia, ferritin >100 I recommend cancelling her appt next week and recheck in 3 mo If OK with patient, I can place POF ----- Message -----    From: [facility] in [facility] Interface    Sent: 04/07/2016  11:03 AM      To: [name], MD

## 2016-04-10 NOTE — Telephone Encounter (Signed)
Pt notified of message below. Pt is agreeable to recheck in 3 months

## 2016-04-12 ENCOUNTER — Telehealth: Payer: Self-pay | Admitting: Hematology and Oncology

## 2016-04-12 NOTE — Telephone Encounter (Signed)
Spoke with patient. Appointment confirmed. Angelica [name].

## 2016-04-14 ENCOUNTER — Ambulatory Visit: Payer: Medicare Other | Admitting: Hematology and Oncology

## 2016-04-14 ENCOUNTER — Ambulatory Visit: Payer: Medicare Other

## 2016-04-14 DIAGNOSIS — F330 Major depressive disorder, recurrent, mild: Secondary | ICD-10-CM | POA: Diagnosis not present

## 2016-04-14 DIAGNOSIS — E538 Deficiency of other specified B group vitamins: Secondary | ICD-10-CM | POA: Diagnosis not present

## 2016-04-14 DIAGNOSIS — I10 Essential (primary) hypertension: Secondary | ICD-10-CM | POA: Diagnosis not present

## 2016-04-14 DIAGNOSIS — J449 Chronic obstructive pulmonary disease, unspecified: Secondary | ICD-10-CM | POA: Diagnosis not present

## 2016-04-14 DIAGNOSIS — Z1389 Encounter for screening for other disorder: Secondary | ICD-10-CM | POA: Diagnosis not present

## 2016-04-14 DIAGNOSIS — Z6841 Body Mass Index (BMI) 40.0 and over, adult: Secondary | ICD-10-CM | POA: Diagnosis not present

## 2016-05-09 DIAGNOSIS — M170 Bilateral primary osteoarthritis of knee: Secondary | ICD-10-CM | POA: Diagnosis not present

## 2016-05-16 DIAGNOSIS — D3132 Benign neoplasm of left choroid: Secondary | ICD-10-CM | POA: Diagnosis not present

## 2016-05-16 DIAGNOSIS — Z961 Presence of intraocular lens: Secondary | ICD-10-CM | POA: Diagnosis not present

## 2016-05-16 DIAGNOSIS — H04123 Dry eye syndrome of bilateral lacrimal glands: Secondary | ICD-10-CM | POA: Diagnosis not present

## 2016-05-16 DIAGNOSIS — H00022 Hordeolum internum right lower eyelid: Secondary | ICD-10-CM | POA: Diagnosis not present

## 2016-05-16 DIAGNOSIS — H02834 Dermatochalasis of left upper eyelid: Secondary | ICD-10-CM | POA: Diagnosis not present

## 2016-05-16 DIAGNOSIS — R7303 Prediabetes: Secondary | ICD-10-CM | POA: Diagnosis not present

## 2016-05-16 DIAGNOSIS — H02831 Dermatochalasis of right upper eyelid: Secondary | ICD-10-CM | POA: Diagnosis not present

## 2016-05-17 DIAGNOSIS — E538 Deficiency of other specified B group vitamins: Secondary | ICD-10-CM | POA: Diagnosis not present

## 2016-06-14 DIAGNOSIS — E538 Deficiency of other specified B group vitamins: Secondary | ICD-10-CM | POA: Diagnosis not present

## 2016-06-23 DIAGNOSIS — M170 Bilateral primary osteoarthritis of knee: Secondary | ICD-10-CM | POA: Diagnosis not present

## 2016-06-23 DIAGNOSIS — M25562 Pain in left knee: Secondary | ICD-10-CM | POA: Diagnosis not present

## 2016-06-23 DIAGNOSIS — M25561 Pain in right knee: Secondary | ICD-10-CM | POA: Diagnosis not present

## 2016-07-07 ENCOUNTER — Telehealth: Payer: Self-pay | Admitting: *Deleted

## 2016-07-07 ENCOUNTER — Other Ambulatory Visit: Payer: Self-pay | Admitting: Hematology and Oncology

## 2016-07-07 ENCOUNTER — Other Ambulatory Visit (HOSPITAL_BASED_OUTPATIENT_CLINIC_OR_DEPARTMENT_OTHER): Payer: Medicare Other

## 2016-07-07 DIAGNOSIS — D509 Iron deficiency anemia, unspecified: Secondary | ICD-10-CM

## 2016-07-07 LAB — CBC & DIFF AND RETIC
BASO%: 0.4 % (ref 0.0–2.0)
Basophils Absolute: 0 10*3/uL (ref 0.0–0.1)
EOS ABS: 0.3 10*3/uL (ref 0.0–0.5)
EOS%: 3.5 % (ref 0.0–7.0)
HCT: 42.3 % (ref 34.8–46.6)
HGB: 13.9 g/dL (ref 11.6–15.9)
Immature Retic Fract: 3 % (ref 1.60–10.00)
LYMPH#: 2.4 10*3/uL (ref 0.9–3.3)
LYMPH%: 31.4 % (ref 14.0–49.7)
MCH: 30.2 pg (ref 25.1–34.0)
MCHC: 32.9 g/dL (ref 31.5–36.0)
MCV: 92 fL (ref 79.5–101.0)
MONO#: 0.7 10*3/uL (ref 0.1–0.9)
MONO%: 9.3 % (ref 0.0–14.0)
NEUT%: 55.4 % (ref 38.4–76.8)
NEUTROS ABS: 4.3 10*3/uL (ref 1.5–6.5)
Platelets: 195 10*3/uL (ref 145–400)
RBC: 4.6 10*6/uL (ref 3.70–5.45)
RDW: 13.6 % (ref 11.2–14.5)
RETIC %: 1.18 % (ref 0.70–2.10)
RETIC CT ABS: 54.28 10*3/uL (ref 33.70–90.70)
WBC: 7.7 10*3/uL (ref 3.9–10.3)

## 2016-07-07 LAB — FERRITIN: FERRITIN: 105 ng/mL (ref 9–269)

## 2016-07-07 NOTE — Telephone Encounter (Signed)
Scheduling msg sent

## 2016-07-07 NOTE — Telephone Encounter (Signed)
Informed pt of Dr. Calton Dach note.  She verbalized understanding.  She is ok w/ canceling appt next week and coming back in 4 months.

## 2016-07-07 NOTE — Telephone Encounter (Signed)
-----   Message from [name], MD sent at 07/07/2016 12:21 PM EDT ----- Regarding: labs are good Cancel her appt next week No change in labs Recommend recheck in 4 months If acceptable to her, I will place scheduling msg ----- Message ----- From: Interface, Lab In [facility] Sent: 07/07/2016  10:49 AM To: [name], MD

## 2016-07-14 ENCOUNTER — Ambulatory Visit: Payer: Medicare Other

## 2016-07-14 ENCOUNTER — Ambulatory Visit: Payer: Medicare Other | Admitting: Hematology and Oncology

## 2016-07-18 DIAGNOSIS — E538 Deficiency of other specified B group vitamins: Secondary | ICD-10-CM | POA: Diagnosis not present

## 2016-08-08 ENCOUNTER — Ambulatory Visit (INDEPENDENT_AMBULATORY_CARE_PROVIDER_SITE_OTHER): Payer: Medicare Other | Admitting: Sports Medicine

## 2016-08-08 ENCOUNTER — Encounter (INDEPENDENT_AMBULATORY_CARE_PROVIDER_SITE_OTHER): Payer: Self-pay | Admitting: Sports Medicine

## 2016-08-08 VITALS — BP 112/78 | HR 75 | Ht 63.0 in | Wt 231.0 lb

## 2016-08-08 DIAGNOSIS — M170 Bilateral primary osteoarthritis of knee: Secondary | ICD-10-CM | POA: Insufficient documentation

## 2016-08-08 NOTE — Progress Notes (Signed)
The patient was scheduled 1 day prior to 6 months for Visco supplementation. We will have her reschedule for after this timeframe. She continues to have bilateral knee pain. No charge office visit.

## 2016-08-11 ENCOUNTER — Ambulatory Visit (INDEPENDENT_AMBULATORY_CARE_PROVIDER_SITE_OTHER): Payer: Medicare Other | Admitting: Sports Medicine

## 2016-08-11 ENCOUNTER — Encounter (INDEPENDENT_AMBULATORY_CARE_PROVIDER_SITE_OTHER): Payer: Self-pay | Admitting: Sports Medicine

## 2016-08-11 VITALS — BP 131/77 | HR 82 | Ht 63.0 in | Wt 231.0 lb

## 2016-08-11 DIAGNOSIS — G8929 Other chronic pain: Secondary | ICD-10-CM

## 2016-08-11 DIAGNOSIS — M170 Bilateral primary osteoarthritis of knee: Secondary | ICD-10-CM

## 2016-08-11 DIAGNOSIS — M25562 Pain in left knee: Secondary | ICD-10-CM

## 2016-08-11 DIAGNOSIS — M25561 Pain in right knee: Secondary | ICD-10-CM

## 2016-08-11 MED ORDER — HYALURONAN 88 MG/4ML IX SOSY
88.0000 mg | PREFILLED_SYRINGE | INTRA_ARTICULAR | Status: AC | PRN
  Administered 2016-08-11: 88 mg via INTRA_ARTICULAR

## 2016-08-11 MED ORDER — HYALURONAN 88 MG/4ML IX SOSY
88.0000 mg | PREFILLED_SYRINGE | INTRA_ARTICULAR | Status: AC | PRN
Start: 2016-08-11 — End: 2016-08-11
  Administered 2016-08-11: 88 mg via INTRA_ARTICULAR

## 2016-08-11 NOTE — Patient Instructions (Signed)
I am transferring practices as of January 1st  to [place] at [facility].  This is a great opportunity & I am saddened to be leaving [facility] however & excited for new opportunities. I will continue to be seeing patients at [facility] through the end of December. I am happy to see you at the new location but also am confident that you are in great hands with the excellent providers here at [facility].  We are not currently scheduling patients at the new location at this time but if you look on [facility]'s website a contact information should be available there closer to January. Additionally [email or web address] will have information when it becomes available.    The telephone number will be [phone]  - Nobody will be answering this phone number until closer to January.

## 2016-08-11 NOTE — Progress Notes (Signed)
Angelica [name] - 75 y.o. female MRN [id number]  Date of birth: [birth year]  Office Visit Note: Visit Date: 08/11/2016 PCP: Velna Hatchet, MD Referred by: Velna Hatchet, MD  Subjective: Chief Complaint  Patient presents with  . Right Knee - Follow-up  . Left Knee - Follow-up  . Follow-up    Here to have Monovisc injection in both knees.     HPI: Here for Monovisc injections bilaterally.  Having persistent pain. Using compression ROS: Otherwise per HPI.   Clinical History: No specialty comments available.  She reports that she quit smoking about 8 years ago. She has never used smokeless tobacco.  No results for input(s): HGBA1C, LABURIC in the last 8760 hours.  Assessment & Plan: Visit Diagnoses:  1. Bilateral primary osteoarthritis of knee   2. Chronic pain of both knees    Plan: Bilateral Monovisc injections as below Follow-up: Return if symptoms worsen or fail to improve.  Meds: No orders of the defined types were placed in this encounter.  Left knee Monovisc aspiration & injection Date/Time: 08/11/2016 2:03 PM Performed by: Teresa Coombs D Authorized by: Teresa Coombs D   Indications:  Pain and joint swelling Location:  Knee Site:  L knee Needle Size:  18 G Needle Length:  1.5 inches Approach:  Superolateral Ultrasound Guidance: Yes   Fluoroscopic Guidance: No   Arthrogram: No   Medications:  88 mg Hyaluronan 88 MG/4ML Aspiration Attempted: No    The patient's clinical condition is marked by substantial pain and/or significant functional disability. Other conservative therapy has not provided relief, is contraindicated, or not appropriate. There is a reasonable likelihood that injection will significantly improve the patient's pain and/or functiona excepted l impairment.  After discussing the risks, benefits and expected outcomes of the injection and all questions were reviewed and answered, the patient wished to undergo the above named procedure.  Verbal  consent was obtained. The target sight was prepped with alcohol scrub. Local anesthesia was obtained with ethyl chloride and 69mL of 1% lidocaine on a 25g needle.  Under real-time ultrasound guidance, Injection of the target structure was performed using the above needle and medications under sterile technique. Band-Aid and the patients own compression sleeves were applied. The patient tolerated this procedure well with no immediate complications. Post injection instructions were provided.   Right knee MonoVisc Aspiration & injection Date/Time: 08/11/2016 2:03 PM Performed by: Teresa Coombs D Authorized by: Teresa Coombs D   Indications:  Pain and joint swelling Location:  Knee Site:  R knee Needle Size:  18 G Needle Length:  1.5 inches Approach:  Superolateral Ultrasound Guidance: Yes   Fluoroscopic Guidance: No   Arthrogram: No   Medications:  88 mg Hyaluronan 88 MG/4ML Aspiration Attempted: Yes   Aspirate amount (mL):  10 Aspirate:  Yellow  The patient's clinical condition is marked by substantial pain and/or significant functional disability. Other conservative therapy has not provided relief, is contraindicated, or not appropriate. There is a reasonable likelihood that injection will significantly improve the patient's pain and/or functional impairment.  After discussing the risks, benefits and expected outcomes of the injection and all questions were reviewed and answered, the patient wished to undergo the above named procedure.  Verbal consent was obtained. The target sight was prepped with alcohol scrub. Local anesthesia was obtained with ethyl chloride and 76mL of 1% lidocaine on a 25g needle.  Under real-time ultrasound guidance, Aspiration and Injection of the target structure was performed using the above needle and medications under  sterile stopcock technique. Band-Aid pts own compression sleeves were applied. The patient tolerated this procedure well with no immediate complications.  Post injection instructions were provided.       Objective:  VS:  HT:5\' 3"  (160 cm)   WT:231 lb (104.8 kg)  BMI:41    BP:131/77  HR:82bpm  TEMP: ( )  RESP:  Physical Exam: Bilateral knees with severe OA bossing. Small effusion on right, no effusion on left. Imaging: No results found.   Past Medical/Family/Surgical/Social History: Medications & Allergies reviewed per EMR Patient Active Problem List   Diagnosis Date Noted  . Bilateral primary osteoarthritis of knee 08/08/2016  . Reactive thrombocytosis 08/24/2014  . Celiac disease 11/17/2013  . Other nonspecific abnormal cardiovascular system function study 09/27/2012  . Pulmonary HTN 09/27/2012  . Blood in stool 07/04/2012  . History of thyroid cancer- s/p total thyroidectomy 2012 07/04/2012  . Fatigue 07/04/2012  . Iron deficiency anemia 07/04/2012  . COPD (chronic obstructive pulmonary disease)- on nocturnal oxygen at home 07/04/2012  . GERD (gastroesophageal reflux disease) 07/04/2012  . Chronic anemia- symptomatic 07/03/2012  . Hypokalemia 07/03/2012  . Essential hypertension 07/03/2012   Past Medical History:  Diagnosis Date  . Anemia   . Arthritis   . Asthma   . Cancer ([place])    thyroid  . Celiac disease 11/17/2013  . COPD (chronic obstructive pulmonary disease) ([place])   . Hypertension   . Thyroid disease    Family History  Problem Relation Age of Onset  . Heart disease Mother   . Kidney failure Father   . Cancer Sister     thyroid cancer   Past Surgical History:  Procedure Laterality Date  . ABDOMINAL HYSTERECTOMY    . CHOLECYSTECTOMY    . COLONOSCOPY  07/05/2012   Procedure: COLONOSCOPY;  Surgeon: Winfield Cunas., MD;  Location: [facility] ENDOSCOPY;  Service: Endoscopy;  Laterality: N/A;  . COLONOSCOPY N/A 03/12/2015   Procedure: COLONOSCOPY;  Surgeon: Laurence Spates, MD;  Location: [facility] ENDOSCOPY;  Service: Endoscopy;  Laterality: N/A;  . ESOPHAGOGASTRODUODENOSCOPY  07/05/2012   Procedure:  ESOPHAGOGASTRODUODENOSCOPY (EGD);  Surgeon: Winfield Cunas., MD;  Location: [facility] ENDOSCOPY;  Service: Endoscopy;  Laterality: N/A;  . HOT HEMOSTASIS N/A 03/12/2015   Procedure: HOT HEMOSTASIS (ARGON PLASMA COAGULATION/BICAP);  Surgeon: Laurence Spates, MD;  Location: [facility] ENDOSCOPY;  Service: Endoscopy;  Laterality: N/A;  . TONSILLECTOMY     Social History   Occupational History  . Not on file.   Social History Main Topics  . Smoking status: Former Smoker    Quit date: 07/03/2008  . Smokeless tobacco: Never Used  . Alcohol use No  . Drug use: No  . Sexual activity: Not on file

## 2016-08-15 DIAGNOSIS — E538 Deficiency of other specified B group vitamins: Secondary | ICD-10-CM | POA: Diagnosis not present

## 2016-08-22 DIAGNOSIS — Z6841 Body Mass Index (BMI) 40.0 and over, adult: Secondary | ICD-10-CM | POA: Diagnosis not present

## 2016-08-22 DIAGNOSIS — J029 Acute pharyngitis, unspecified: Secondary | ICD-10-CM | POA: Diagnosis not present

## 2016-08-22 DIAGNOSIS — E559 Vitamin D deficiency, unspecified: Secondary | ICD-10-CM | POA: Diagnosis not present

## 2016-08-22 DIAGNOSIS — J019 Acute sinusitis, unspecified: Secondary | ICD-10-CM | POA: Diagnosis not present

## 2016-09-19 DIAGNOSIS — E538 Deficiency of other specified B group vitamins: Secondary | ICD-10-CM | POA: Diagnosis not present

## 2016-09-26 DIAGNOSIS — Z85850 Personal history of malignant neoplasm of thyroid: Secondary | ICD-10-CM | POA: Diagnosis not present

## 2016-09-26 DIAGNOSIS — R7309 Other abnormal glucose: Secondary | ICD-10-CM | POA: Diagnosis not present

## 2016-09-26 DIAGNOSIS — I10 Essential (primary) hypertension: Secondary | ICD-10-CM | POA: Diagnosis not present

## 2016-09-26 DIAGNOSIS — E538 Deficiency of other specified B group vitamins: Secondary | ICD-10-CM | POA: Diagnosis not present

## 2016-09-26 DIAGNOSIS — M810 Age-related osteoporosis without current pathological fracture: Secondary | ICD-10-CM | POA: Diagnosis not present

## 2016-10-04 DIAGNOSIS — F330 Major depressive disorder, recurrent, mild: Secondary | ICD-10-CM | POA: Diagnosis not present

## 2016-10-04 DIAGNOSIS — E559 Vitamin D deficiency, unspecified: Secondary | ICD-10-CM | POA: Diagnosis not present

## 2016-10-04 DIAGNOSIS — M810 Age-related osteoporosis without current pathological fracture: Secondary | ICD-10-CM | POA: Diagnosis not present

## 2016-10-04 DIAGNOSIS — Z0000 Encounter for general adult medical examination without abnormal findings: Secondary | ICD-10-CM | POA: Diagnosis not present

## 2016-10-04 DIAGNOSIS — Z1389 Encounter for screening for other disorder: Secondary | ICD-10-CM | POA: Diagnosis not present

## 2016-10-04 DIAGNOSIS — E538 Deficiency of other specified B group vitamins: Secondary | ICD-10-CM | POA: Diagnosis not present

## 2016-10-04 DIAGNOSIS — J45909 Unspecified asthma, uncomplicated: Secondary | ICD-10-CM | POA: Diagnosis not present

## 2016-10-04 DIAGNOSIS — E890 Postprocedural hypothyroidism: Secondary | ICD-10-CM | POA: Diagnosis not present

## 2016-10-04 DIAGNOSIS — Z6841 Body Mass Index (BMI) 40.0 and over, adult: Secondary | ICD-10-CM | POA: Diagnosis not present

## 2016-10-04 DIAGNOSIS — I10 Essential (primary) hypertension: Secondary | ICD-10-CM | POA: Diagnosis not present

## 2016-10-04 DIAGNOSIS — D509 Iron deficiency anemia, unspecified: Secondary | ICD-10-CM | POA: Diagnosis not present

## 2016-10-11 ENCOUNTER — Other Ambulatory Visit (HOSPITAL_COMMUNITY): Payer: Self-pay | Admitting: *Deleted

## 2016-10-13 ENCOUNTER — Inpatient Hospital Stay (HOSPITAL_COMMUNITY): Admission: RE | Admit: 2016-10-13 | Payer: Medicare Other | Source: Ambulatory Visit

## 2016-10-19 DIAGNOSIS — E538 Deficiency of other specified B group vitamins: Secondary | ICD-10-CM | POA: Diagnosis not present

## 2016-10-23 ENCOUNTER — Ambulatory Visit (HOSPITAL_COMMUNITY)
Admission: RE | Admit: 2016-10-23 | Discharge: 2016-10-23 | Disposition: A | Discharge status: Home / Self Care | Payer: Medicare Other | Source: Ambulatory Visit | Attending: Internal Medicine | Admitting: Internal Medicine

## 2016-10-23 DIAGNOSIS — M810 Age-related osteoporosis without current pathological fracture: Secondary | ICD-10-CM | POA: Insufficient documentation

## 2016-10-23 MED ORDER — ZOLEDRONIC ACID 5 MG/100ML IV SOLN
5.0000 mg | Freq: Once | INTRAVENOUS | Status: AC
  Administered 2016-10-23: 5 mg via INTRAVENOUS

## 2016-10-23 MED ORDER — ZOLEDRONIC ACID 5 MG/100ML IV SOLN
INTRAVENOUS | Status: AC
  Administered 2016-10-23: 5 mg via INTRAVENOUS
  Filled 2016-10-23: qty 100

## 2016-11-03 ENCOUNTER — Other Ambulatory Visit (HOSPITAL_BASED_OUTPATIENT_CLINIC_OR_DEPARTMENT_OTHER): Payer: Medicare Other

## 2016-11-03 ENCOUNTER — Telehealth: Payer: Self-pay | Admitting: Medical Oncology

## 2016-11-03 DIAGNOSIS — D509 Iron deficiency anemia, unspecified: Secondary | ICD-10-CM

## 2016-11-03 LAB — CBC & DIFF AND RETIC
BASO%: 0.5 % (ref 0.0–2.0)
Basophils Absolute: 0 10*3/uL (ref 0.0–0.1)
EOS%: 3.5 % (ref 0.0–7.0)
Eosinophils Absolute: 0.3 10*3/uL (ref 0.0–0.5)
HEMATOCRIT: 45.2 % (ref 34.8–46.6)
HGB: 14.7 g/dL (ref 11.6–15.9)
Immature Retic Fract: 9.4 % (ref 1.60–10.00)
LYMPH%: 36.6 % (ref 14.0–49.7)
MCH: 29.4 pg (ref 25.1–34.0)
MCHC: 32.5 g/dL (ref 31.5–36.0)
MCV: 90.4 fL (ref 79.5–101.0)
MONO#: 0.7 10*3/uL (ref 0.1–0.9)
MONO%: 9.3 % (ref 0.0–14.0)
NEUT%: 50.1 % (ref 38.4–76.8)
NEUTROS ABS: 3.9 10*3/uL (ref 1.5–6.5)
Platelets: 272 10*3/uL (ref 145–400)
RBC: 5 10*6/uL (ref 3.70–5.45)
RDW: 13.5 % (ref 11.2–14.5)
RETIC %: 1.69 % (ref 0.70–2.10)
Retic Ct Abs: 84.5 10*3/uL (ref 33.70–90.70)
WBC: 7.8 10*3/uL (ref 3.9–10.3)
lymph#: 2.8 10*3/uL (ref 0.9–3.3)

## 2016-11-03 LAB — FERRITIN: Ferritin: 79 ng/ml (ref 9–269)

## 2016-11-03 NOTE — Telephone Encounter (Signed)
I contacted pt with results and recommendation. She is agreeable to wait 3 months. I told her I cancelled appts for next Friday. I also told her to contact us if she has any problems. Schedule request sent.

## 2016-11-03 NOTE — Telephone Encounter (Signed)
Per Dr Alvy Bimler ,call pt with her ferritin level and she wants to delay her tx by 3 months of okay with pt. I called pt phone and it rang 15 times no answer.

## 2016-11-10 ENCOUNTER — Ambulatory Visit: Payer: Medicare Other | Admitting: Hematology and Oncology

## 2016-11-10 ENCOUNTER — Ambulatory Visit: Payer: Medicare Other

## 2016-11-16 DIAGNOSIS — E538 Deficiency of other specified B group vitamins: Secondary | ICD-10-CM | POA: Diagnosis not present

## 2016-12-12 ENCOUNTER — Other Ambulatory Visit: Payer: Self-pay

## 2016-12-12 NOTE — Addendum Note (Signed)
Addended by: Tobi Bastos on: 12/12/2016 05:19 PM   Modules accepted: Level of Service, SmartSet

## 2016-12-12 NOTE — Telephone Encounter (Signed)
This encounter was created in error - please disregard.

## 2016-12-14 DIAGNOSIS — E538 Deficiency of other specified B group vitamins: Secondary | ICD-10-CM | POA: Diagnosis not present

## 2017-01-18 DIAGNOSIS — E538 Deficiency of other specified B group vitamins: Secondary | ICD-10-CM | POA: Diagnosis not present

## 2017-01-26 ENCOUNTER — Telehealth: Payer: Self-pay | Admitting: *Deleted

## 2017-01-26 ENCOUNTER — Other Ambulatory Visit (HOSPITAL_BASED_OUTPATIENT_CLINIC_OR_DEPARTMENT_OTHER): Payer: Medicare Other

## 2017-01-26 DIAGNOSIS — D509 Iron deficiency anemia, unspecified: Secondary | ICD-10-CM | POA: Diagnosis present

## 2017-01-26 LAB — CBC & DIFF AND RETIC
BASO%: 0.5 % (ref 0.0–2.0)
Basophils Absolute: 0 10*3/uL (ref 0.0–0.1)
EOS ABS: 0.3 10*3/uL (ref 0.0–0.5)
EOS%: 4.1 % (ref 0.0–7.0)
HCT: 44.3 % (ref 34.8–46.6)
HEMOGLOBIN: 14.3 g/dL (ref 11.6–15.9)
IMMATURE RETIC FRACT: 5 % (ref 1.60–10.00)
LYMPH%: 37 % (ref 14.0–49.7)
MCH: 29.5 pg (ref 25.1–34.0)
MCHC: 32.3 g/dL (ref 31.5–36.0)
MCV: 91.5 fL (ref 79.5–101.0)
MONO#: 0.8 10*3/uL (ref 0.1–0.9)
MONO%: 10.4 % (ref 0.0–14.0)
NEUT%: 48 % (ref 38.4–76.8)
NEUTROS ABS: 3.9 10*3/uL (ref 1.5–6.5)
Platelets: 231 10*3/uL (ref 145–400)
RBC: 4.84 10*6/uL (ref 3.70–5.45)
RDW: 14.1 % (ref 11.2–14.5)
RETIC %: 1.05 % (ref 0.70–2.10)
RETIC CT ABS: 50.82 10*3/uL (ref 33.70–90.70)
WBC: 8.1 10*3/uL (ref 3.9–10.3)
lymph#: 3 10*3/uL (ref 0.9–3.3)

## 2017-01-26 LAB — FERRITIN: FERRITIN: 84 ng/mL (ref 9–269)

## 2017-01-26 NOTE — Telephone Encounter (Signed)
Notified of message below. OK to be followed by PCP. Said to tell Dr Alvy Bimler she took good care of me!

## 2017-01-26 NOTE — Telephone Encounter (Signed)
-----   Message from [name], MD sent at 01/26/2017 12:39 PM EDT ----- Regarding: labs normal Tell her labs are normal If OK with her, cancel appt next week I recommend she follows with pcp only; last IV iron is > 1year ago ----- Message ----- From: Interface, Lab In [facility] Sent: 01/26/2017  11:25 AM To: [name], MD

## 2017-01-31 ENCOUNTER — Encounter: Payer: Self-pay | Admitting: Sports Medicine

## 2017-01-31 ENCOUNTER — Ambulatory Visit: Payer: Self-pay

## 2017-01-31 ENCOUNTER — Ambulatory Visit (INDEPENDENT_AMBULATORY_CARE_PROVIDER_SITE_OTHER): Payer: Medicare Other | Admitting: Sports Medicine

## 2017-01-31 DIAGNOSIS — M170 Bilateral primary osteoarthritis of knee: Secondary | ICD-10-CM

## 2017-01-31 NOTE — Progress Notes (Signed)
OFFICE VISIT NOTE Angelica [name]. Kilo Eshelman, [place] at [facility] [phone]  Angelica [name] - 76 y.o. female MRN [id number]  Date of birth: [birth year]  Visit Date: 01/31/2017  PCP: Velna Hatchet, MD   Referred by: Velna Hatchet, MD  Autumn McNeil,cma acting as scribe for Dr. Paulla Fore.  SUBJECTIVE:   Chief Complaint  Patient presents with  . Follow-up Bilateral Knee OA   HPI: As below and per problem based documentation when appropriate.  Here to have Monovisc injection in both knees. Last injection series was 08-11-2016 which is less than 6 months ago. In order to for the next series it needs to be 6 months and 1 day. Agreeable for steriod injections.     ROS  Otherwise per HPI.  HISTORY & PERTINENT PRIOR DATA:  No specialty comments available. She reports that she quit smoking about 8 years ago. She has never used smokeless tobacco. No results for input(s): HGBA1C, LABURIC in the last 8760 hours. Medications & Allergies reviewed per EMR Patient Active Problem List   Diagnosis Date Noted  . Bilateral primary osteoarthritis of knee 08/08/2016  . Reactive thrombocytosis 08/24/2014  . Celiac disease 11/17/2013  . Other nonspecific abnormal cardiovascular system function study 09/27/2012  . Pulmonary HTN ([place]) 09/27/2012  . Blood in stool 07/04/2012  . History of thyroid cancer- s/p total thyroidectomy 2012 07/04/2012  . Fatigue 07/04/2012  . Iron deficiency anemia 07/04/2012  . COPD (chronic obstructive pulmonary disease)- on nocturnal oxygen at home 07/04/2012  . GERD (gastroesophageal reflux disease) 07/04/2012  . Chronic anemia- symptomatic 07/03/2012  . Hypokalemia 07/03/2012  . Essential hypertension 07/03/2012   Past Medical History:  Diagnosis Date  . Anemia   . Arthritis   . Asthma   . Cancer ([place])    thyroid  . Celiac disease 11/17/2013  . COPD (chronic obstructive pulmonary disease) ([place])   . Hypertension   .  Thyroid disease    Family History  Problem Relation Age of Onset  . Heart disease Mother   . Kidney failure Father   . Cancer Sister     thyroid cancer   Past Surgical History:  Procedure Laterality Date  . ABDOMINAL HYSTERECTOMY    . CHOLECYSTECTOMY    . COLONOSCOPY  07/05/2012   Procedure: COLONOSCOPY;  Surgeon: Winfield Cunas., MD;  Location: [facility] ENDOSCOPY;  Service: Endoscopy;  Laterality: N/A;  . COLONOSCOPY N/A 03/12/2015   Procedure: COLONOSCOPY;  Surgeon: Laurence Spates, MD;  Location: [facility] ENDOSCOPY;  Service: Endoscopy;  Laterality: N/A;  . ESOPHAGOGASTRODUODENOSCOPY  07/05/2012   Procedure: ESOPHAGOGASTRODUODENOSCOPY (EGD);  Surgeon: Winfield Cunas., MD;  Location: [facility] ENDOSCOPY;  Service: Endoscopy;  Laterality: N/A;  . HOT HEMOSTASIS N/A 03/12/2015   Procedure: HOT HEMOSTASIS (ARGON PLASMA COAGULATION/BICAP);  Surgeon: Laurence Spates, MD;  Location: [facility] ENDOSCOPY;  Service: Endoscopy;  Laterality: N/A;  . TONSILLECTOMY     Social History   Occupational History  . Not on file.   Social History Main Topics  . Smoking status: Former Smoker    Quit date: 07/03/2008  . Smokeless tobacco: Never Used  . Alcohol use No  . Drug use: No  . Sexual activity: Not on file    OBJECTIVE:  VS:  HT:5\' 3"  (160 cm)   WT:220 lb 3.2 oz (99.9 kg)  BMI:39.1    BP:(!) 160/90  HR:79bpm  TEMP: ( )  RESP:97 % EXAM: Findings:  WDWN, NAD, Non-toxic appearing Alert & appropriately  interactive Not depressed or anxious appearing No increased work of breathing. Pupils are equal. EOM intact without nystagmus No clubbing or cyanosis of the extremities appreciated No significant rashes/lesions/ulcerations overlying the examined area. DP & PT pulses 2+/4.  No significant pretibial edema. Sensation intact to light touch in upper and lower extremities.  Bilateral knee show generalized osteophytic bossing.  A small amount of generalized synovitis with small effusions bilaterally. Genu  valgus at baseline that is mild. Ligamentously stable with solid endpoints but 3-4 mm of opening with valgus testing bilaterally.  Pain with patellar grind as well as with flexion and extension.  Range of motion from 0 to 105       No results found. ASSESSMENT & PLAN:   Problem List Items Addressed This Visit    Bilateral primary osteoarthritis of knee    PROCEDURE NOTE - ULTRASOUND GUIDED INJECTION: Bilateral knee Images were obtained and interpreted by myself, Teresa Coombs, DO  Images have been saved and stored to PACS system. Images obtained on: GE S7 Ultrasound machine  ULTRASOUND FINDINGS: Generalized synovitis with only small effusions bilaterally, degenerative changes  DESCRIPTION OF PROCEDURE:  The patient's clinical condition is marked by substantial pain and/or significant functional disability. Other conservative therapy has not provided relief, is contraindicated, or not appropriate. There is a reasonable likelihood that injection will significantly improve the patient's pain and/or functional impairment. After discussing the risks, benefits and expected outcomes of the injection and all questions were reviewed and answered, the patient wished to undergo the above named procedure. Verbal consent was obtained. The ultrasound was used to identify the target structure and adjacent neurovascular structures. The skin was then prepped in sterile fashion and the target structure was injected under direct visualization using sterile technique as below:  RIGHT KNEE: PREP: Alcohol, Ethel Chloride,  APPROACH: Superiolateral, single injection, 25g 1.5" needle INJECTATE: 2 cc 0.5% marcaine, 1cc 40mg  DepoMedrol ASPIRATE: N/A DRESSING: Band-Aid  LEFT KNEE: PREP: Alcohol, Ethel Chloride,  APPROACH: Superiolateral, single injection, 25g 1.5" needle INJECTATE: 2 cc 0.5% marcaine, 1cc 40mg  DepoMedrol ASPIRATE: N/A DRESSING: Band-Aid  Post procedural instructions including  recommending icing and warning signs for infection were reviewed. This procedure was well tolerated and there were no complications.   IMPRESSION: Succesful US Guided Injection       Relevant Orders   [place] LIMITED JOINT SPACE STRUCTURES LOW BILAT(NO LINKED CHARGES)      Follow-up: Return in about 3 weeks (around 02/21/2017) for For [place].   CMA/ATC served as Education administrator during this visit. History, Physical, and Plan performed by medical provider. Documentation and orders reviewed and attested to.      Teresa Coombs, [place] [facility] Medicine Physician    01/31/2017 1:28 PM

## 2017-01-31 NOTE — Assessment & Plan Note (Signed)
PROCEDURE NOTE - ULTRASOUND GUIDED INJECTION: Bilateral knee Images were obtained and interpreted by myself, Teresa Coombs, DO  Images have been saved and stored to PACS system. Images obtained on: GE S7 Ultrasound machine  ULTRASOUND FINDINGS: Generalized synovitis with only small effusions bilaterally, degenerative changes  DESCRIPTION OF PROCEDURE:  The patient's clinical condition is marked by substantial pain and/or significant functional disability. Other conservative therapy has not provided relief, is contraindicated, or not appropriate. There is a reasonable likelihood that injection will significantly improve the patient's pain and/or functional impairment. After discussing the risks, benefits and expected outcomes of the injection and all questions were reviewed and answered, the patient wished to undergo the above named procedure. Verbal consent was obtained. The ultrasound was used to identify the target structure and adjacent neurovascular structures. The skin was then prepped in sterile fashion and the target structure was injected under direct visualization using sterile technique as below:  RIGHT KNEE: PREP: Alcohol, Ethel Chloride,  APPROACH: Superiolateral, single injection, 25g 1.5" needle INJECTATE: 2 cc 0.5% marcaine, 1cc 40mg  DepoMedrol ASPIRATE: N/A DRESSING: Band-Aid  LEFT KNEE: PREP: Alcohol, Ethel Chloride,  APPROACH: Superiolateral, single injection, 25g 1.5" needle INJECTATE: 2 cc 0.5% marcaine, 1cc 40mg  DepoMedrol ASPIRATE: N/A DRESSING: Band-Aid  Post procedural instructions including recommending icing and warning signs for infection were reviewed. This procedure was well tolerated and there were no complications.   IMPRESSION: Succesful US Guided Injection

## 2017-02-02 ENCOUNTER — Ambulatory Visit: Payer: Medicare Other | Admitting: Hematology and Oncology

## 2017-02-02 ENCOUNTER — Ambulatory Visit: Payer: Medicare Other

## 2017-02-15 DIAGNOSIS — E538 Deficiency of other specified B group vitamins: Secondary | ICD-10-CM | POA: Diagnosis not present

## 2017-02-21 ENCOUNTER — Ambulatory Visit: Payer: Self-pay

## 2017-02-21 ENCOUNTER — Ambulatory Visit (INDEPENDENT_AMBULATORY_CARE_PROVIDER_SITE_OTHER): Payer: Medicare Other | Admitting: Sports Medicine

## 2017-02-21 ENCOUNTER — Encounter: Payer: Self-pay | Admitting: Sports Medicine

## 2017-02-21 VITALS — BP 120/82 | HR 86 | Ht 63.0 in | Wt 222.6 lb

## 2017-02-21 DIAGNOSIS — R0981 Nasal congestion: Secondary | ICD-10-CM | POA: Diagnosis not present

## 2017-02-21 DIAGNOSIS — M170 Bilateral primary osteoarthritis of knee: Secondary | ICD-10-CM

## 2017-02-21 DIAGNOSIS — G8929 Other chronic pain: Secondary | ICD-10-CM

## 2017-02-21 DIAGNOSIS — M25562 Pain in left knee: Secondary | ICD-10-CM

## 2017-02-21 DIAGNOSIS — M25561 Pain in right knee: Secondary | ICD-10-CM | POA: Diagnosis not present

## 2017-02-21 NOTE — Progress Notes (Signed)
OFFICE VISIT NOTE [name], [place] at [facility] [phone]  Angelica [name] - 76 y.o. female MRN [id number]  Date of birth: 01/23/1941  Visit Date: 02/21/2017  PCP: Velna Hatchet, MD   Referred by: Velna Hatchet, MD  Burlene Arnt, CMA acting as scribe for Dr. Paulla Fore.  SUBJECTIVE:   Chief Complaint  Patient presents with  . Follow-up    bilateral knee pain   HPI: As below and per problem based documentation when appropriate.  Pt presents today in follow-up of bilateral knee pain. Pt is requesting Monovisc injection.  Pt reports chronic bilateral knee pain Pt has dx of osteoarthritis.   The pain is described as stabbing and aching pain and is rated as 6/10.  Worsened with going up and down stairs.  Improves with rest Therapies tried include : Pt received steroid injection 01/31/17 and last Monovisc injection was 08/11/2016. Pt reports that the steroid injection did help with her knee pain.   Other associated symptoms include: Pt reports pain in right thigh and left calf. Pt also has pain in lower back and hips.   Pt denies fever, chills, night sweats, unintentional weight gain or weight loss.     Review of Systems  Constitutional: Negative for chills and fever.  HENT: Positive for congestion.   Respiratory: Positive for wheezing. Negative for shortness of breath.   Cardiovascular: Positive for chest pain (Pt has GERD) and leg swelling (right leg). Negative for palpitations.  Musculoskeletal: Positive for back pain and joint pain. Negative for falls.  Neurological: Negative for dizziness, tingling and headaches.  Endo/Heme/Allergies: Does not bruise/bleed easily.    Otherwise per HPI.  HISTORY & PERTINENT PRIOR DATA:  No specialty comments available. She reports that she quit smoking about 8 years ago. She has never used smokeless tobacco. No results for input(s): HGBA1C, LABURIC in the last 8760  hours. Medications & Allergies reviewed per EMR Patient Active Problem List   Diagnosis Date Noted  . Bilateral primary osteoarthritis of knee 08/08/2016  . Reactive thrombocytosis 08/24/2014  . Celiac disease 11/17/2013  . Other nonspecific abnormal cardiovascular system function study 09/27/2012  . Pulmonary HTN ([place]) 09/27/2012  . Blood in stool 07/04/2012  . History of thyroid cancer- s/p total thyroidectomy 2012 07/04/2012  . Fatigue 07/04/2012  . Iron deficiency anemia 07/04/2012  . COPD (chronic obstructive pulmonary disease)- on nocturnal oxygen at home 07/04/2012  . GERD (gastroesophageal reflux disease) 07/04/2012  . Chronic anemia- symptomatic 07/03/2012  . Hypokalemia 07/03/2012  . Essential hypertension 07/03/2012   Past Medical History:  Diagnosis Date  . Anemia   . Arthritis   . Asthma   . Cancer ([place])    thyroid  . Celiac disease 11/17/2013  . COPD (chronic obstructive pulmonary disease) ([place])   . Hypertension   . Thyroid disease    Family History  Problem Relation Age of Onset  . Heart disease Mother   . Kidney failure Father   . Cancer Sister        thyroid cancer   Past Surgical History:  Procedure Laterality Date  . ABDOMINAL HYSTERECTOMY    . CHOLECYSTECTOMY    . COLONOSCOPY  07/05/2012   Procedure: COLONOSCOPY;  Surgeon: Winfield Cunas., MD;  Location: [facility] ENDOSCOPY;  Service: Endoscopy;  Laterality: N/A;  . COLONOSCOPY N/A 03/12/2015   Procedure: COLONOSCOPY;  Surgeon: Laurence Spates, MD;  Location: [facility] ENDOSCOPY;  Service: Endoscopy;  Laterality: N/A;  . ESOPHAGOGASTRODUODENOSCOPY  07/05/2012   Procedure: ESOPHAGOGASTRODUODENOSCOPY (EGD);  Surgeon: Winfield Cunas., MD;  Location: [facility] ENDOSCOPY;  Service: Endoscopy;  Laterality: N/A;  . HOT HEMOSTASIS N/A 03/12/2015   Procedure: HOT HEMOSTASIS (ARGON PLASMA COAGULATION/BICAP);  Surgeon: Laurence Spates, MD;  Location: [facility] ENDOSCOPY;  Service: Endoscopy;  Laterality: N/A;  . TONSILLECTOMY      Social History   Occupational History  . Not on file.   Social History Main Topics  . Smoking status: Former Smoker    Quit date: 07/03/2008  . Smokeless tobacco: Never Used  . Alcohol use No  . Drug use: No  . Sexual activity: Not on file    OBJECTIVE:  VS:  HT:5' 3"  (160 cm)   WT:222 lb 9.6 oz (101 kg)  BMI:39.5    BP:120/82  HR:86bpm  TEMP: ( )  RESP:93 % EXAM: Findings:  WDWN, NAD, Non-toxic appearing Alert & appropriately interactive Not depressed or anxious appearing No increased work of breathing. Pupils are equal. EOM intact without nystagmus No clubbing or cyanosis of the extremities appreciated No significant rashes/lesions/ulcerations overlying the examined area. DP & PT pulses 2+/4.  Trace pretibial edema Sensation intact to light touch in lower extremities.  Bilateral knees: Generalized osteophytic bossing.  Small effusions.  No focal bony tenderness but generalized TTP.  Pain with McMurray's.  Extensor mechanism intact.     No results found. ASSESSMENT & PLAN:   Problem List Items Addressed This Visit    Bilateral primary osteoarthritis of knee    Continue with compression sleeves.  Repeat Monovisc performed today as this is providing her the most improvement.  ++++++++++++++++++++++++++++++++++++++++++++ PROCEDURE NOTE - ULTRASOUND GUIDED ASPIRATION & INJECTION: Bi Kristine Royal, ATClateral Knee Monovisc Images were obtained and interpreted by myself, Teresa Coombs, DO  Images have been saved and stored to PACS system. Images obtained on: GE S7 Ultrasound machine  ULTRASOUND FINDINGS: Degenerative Changes  DESCRIPTION OF PROCEDURE:  The patient's clinical condition is marked by substantial pain and/or significant functional disability. Other conservative therapy has not provided relief, is contraindicated, or not appropriate. There is a reasonable likelihood that injection will significantly improve the patient's pain and/or functional  impairment. After discussing the risks, benefits and expected outcomes of the injection and all questions were reviewed and answered, the patient wished to undergo the above named procedure. Verbal consent was obtained. The ultrasound was used to identify the target structure and adjacent neurovascular structures. The skin was then prepped in sterile fashion and the target structure was injected under direct visualization using sterile technique as below:  Right: PREP: Alcohol, Ethel Chloride APPROACH: Superiolateral, stopcock technique, 21g 2" needle INJECTATE: 59m> Consider: Monovisc ASPIRATE: N/A DRESSING: Band-Aid & Pts own compression sleeve  Left: PREP: Alcohol, Ethel Chloride APPROACH: Superiolateral, stopcock technique, 21g 2" needle INJECTATE: 873m Consider: Monovisc ASPIRATE: N/A DRESSING: Band-Aid & Pts own compression sleeve  Post procedural instructions including recommending icing and warning signs for infection were reviewed. This procedure was well tolerated and there were no complications.   IMPRESSION: Succesful USKoreauided Monovisc Injections - Bilateral Knees         Other Visit Diagnoses    Chronic pain of both knees    -  Primary   Relevant Orders   USKoreaUIDED NEEDLE PLACEMENT(NO LINKED CHARGES)   Sinus congestion       Likely allergy related.  If any persistent symptoms follow-up with PCP.      Follow-up: Return in about 10 weeks (around 05/02/2017).   CMA/ATC served  as scribe during this visit. History, Physical, and Plan performed by medical provider. Documentation and orders reviewed and attested to.      Teresa Coombs, [facility] Physician

## 2017-02-21 NOTE — Patient Instructions (Signed)
Try picking up loratadine over-the-counter which is generic for Claritin for your congestion and skin rash.  You can continue using the calamine lotion as well.  If you are not better in the next several weeks let us plan to see you back prior to your next appointment

## 2017-02-25 NOTE — Assessment & Plan Note (Signed)
Continue with compression sleeves.  Repeat Monovisc performed today as this is providing her the most improvement.  ++++++++++++++++++++++++++++++++++++++++++++ PROCEDURE NOTE - ULTRASOUND GUIDED ASPIRATION & INJECTION: Bi Kristine Royal, ATClateral Knee Monovisc Images were obtained and interpreted by myself, Teresa Coombs, DO  Images have been saved and stored to PACS system. Images obtained on: GE S7 Ultrasound machine  ULTRASOUND FINDINGS: Degenerative Changes  DESCRIPTION OF PROCEDURE:  The patient's clinical condition is marked by substantial pain and/or significant functional disability. Other conservative therapy has not provided relief, is contraindicated, or not appropriate. There is a reasonable likelihood that injection will significantly improve the patient's pain and/or functional impairment. After discussing the risks, benefits and expected outcomes of the injection and all questions were reviewed and answered, the patient wished to undergo the above named procedure. Verbal consent was obtained. The ultrasound was used to identify the target structure and adjacent neurovascular structures. The skin was then prepped in sterile fashion and the target structure was injected under direct visualization using sterile technique as below:  Right: PREP: Alcohol, Ethel Chloride APPROACH: Superiolateral, stopcock technique, 21g 2" needle INJECTATE: 72m> Consider: Monovisc ASPIRATE: N/A DRESSING: Band-Aid & Pts own compression sleeve  Left: PREP: Alcohol, Ethel Chloride APPROACH: Superiolateral, stopcock technique, 21g 2" needle INJECTATE: 891m Consider: Monovisc ASPIRATE: N/A DRESSING: Band-Aid & Pts own compression sleeve  Post procedural instructions including recommending icing and warning signs for infection were reviewed. This procedure was well tolerated and there were no complications.   IMPRESSION: Succesful USKoreauided Monovisc Injections - Bilateral Knees

## 2017-02-28 ENCOUNTER — Other Ambulatory Visit: Payer: Self-pay | Admitting: Internal Medicine

## 2017-02-28 DIAGNOSIS — Z1231 Encounter for screening mammogram for malignant neoplasm of breast: Secondary | ICD-10-CM

## 2017-03-20 DIAGNOSIS — E538 Deficiency of other specified B group vitamins: Secondary | ICD-10-CM | POA: Diagnosis not present

## 2017-04-03 ENCOUNTER — Ambulatory Visit
Admission: RE | Admit: 2017-04-03 | Discharge: 2017-04-03 | Disposition: A | Discharge status: Home / Self Care | Payer: Medicare Other | Source: Ambulatory Visit | Attending: Internal Medicine | Admitting: Internal Medicine

## 2017-04-03 DIAGNOSIS — Z1231 Encounter for screening mammogram for malignant neoplasm of breast: Secondary | ICD-10-CM | POA: Diagnosis not present

## 2017-04-24 DIAGNOSIS — E538 Deficiency of other specified B group vitamins: Secondary | ICD-10-CM | POA: Diagnosis not present

## 2017-05-02 ENCOUNTER — Ambulatory Visit: Payer: Self-pay

## 2017-05-02 ENCOUNTER — Ambulatory Visit (INDEPENDENT_AMBULATORY_CARE_PROVIDER_SITE_OTHER): Payer: Medicare Other | Admitting: Sports Medicine

## 2017-05-02 VITALS — BP 140/90 | HR 79 | Ht 63.0 in | Wt 223.0 lb

## 2017-05-02 DIAGNOSIS — M25562 Pain in left knee: Secondary | ICD-10-CM | POA: Diagnosis not present

## 2017-05-02 DIAGNOSIS — M25561 Pain in right knee: Secondary | ICD-10-CM | POA: Diagnosis not present

## 2017-05-02 DIAGNOSIS — M170 Bilateral primary osteoarthritis of knee: Secondary | ICD-10-CM | POA: Diagnosis not present

## 2017-05-02 DIAGNOSIS — G8929 Other chronic pain: Secondary | ICD-10-CM | POA: Diagnosis not present

## 2017-05-02 NOTE — Procedures (Signed)
PROCEDURE NOTE -  ULTRASOUND GUIDEDINJECTION: Bilateral Knee Injections Images were obtained and interpreted by myself, Teresa Coombs, DO  Images have been saved and stored to PACS system. Images obtained on: GE S7 Ultrasound machine  ULTRASOUND FINDINGS:  Tricompartmental degenerative changes bilaterally with small effusion on the right and trace effusion on the left.  DESCRIPTION OF PROCEDURE:  The patient's clinical condition is marked by substantial pain and/or significant functional disability. Other conservative therapy has not provided relief, is contraindicated, or not appropriate. There is a reasonable likelihood that injection will significantly improve the patient's pain and/or functional impairment. After discussing the risks, benefits and expected outcomes of the injection and all questions were reviewed and answered, the patient wished to undergo the above named procedure. Verbal consent was obtained. The ultrasound was used to identify the target structure and adjacent neurovascular structures. The skin was then prepped in sterile fashion and the target structure was injected under direct visualization using sterile technique as below: RIGHT KNEE: PREP: Alcohol, Ethel Chloride APPROACH: direct inplane, single injection, 25g 1.5" needle INJECTATE: 2cc 1% lidocaine, 1 cc 40mg  DepoMedrol ASPIRATE: N/A DRESSING: Band-Aid & elastic compression wraps  LEFT KNEE: PREP: Alcohol, Ethel Chloride APPROACH: direct inplane, single injection, 25g 1.5" needle INJECTATE: 2cc 1% lidocaine, 1 cc 40mg  DepoMedrol ASPIRATE: N/A DRESSING: Band-Aid & elastic compression wraps  Post procedural instructions including recommending icing and warning signs for infection were reviewed. This procedure was well tolerated and there were no complications.   IMPRESSION: Succesful US Guided Injection

## 2017-05-02 NOTE — Patient Instructions (Signed)

## 2017-05-02 NOTE — Assessment & Plan Note (Signed)
Repeat bilateral cortisone injections performed today.  Patient did have questions regarding potential for potential radiofrequency ablation that she saw on TV.  Discussed that this may be worth looking into to find out more regarding the costs and the exact details.  I am unaware of any local providers that are providing this but believe this is going to be an RFA to the genicular nerves.

## 2017-05-02 NOTE — Progress Notes (Signed)
OFFICE VISIT NOTE Angelica [name]. Angelica [name], [place] at [facility] [phone]  Angelica [name] - 76 y.o. female MRN [id number]  Date of birth: November 30, 1940  Visit Date: 05/02/2017  PCP: Angelica Hatchet, MD   Referred by: Angelica Hatchet, MD  [address], cma acting as scribe for Dr. Paulla [name].  SUBJECTIVE:   Chief Complaint  Patient presents with  . Follow-up  . Osteoarthritis of Both Knees   HPI: As below and per problem based documentation when appropriate.   Angelica [name] is an establish care patient that received Bilateral Monovisc injection in RT and LT knees on 02-21-2017. Reports improvement with no pain until last Wednesday. No acute injury. C/o worsening pain in LT knee than RT. She is wearing compression sleeves daily. Icing and Elevation help with the pain. Associated sx is swelling. Location is the anterior part of knee.   Since last week she reports worsening back pain. Feels this is associated with her knee pain. Location is the lower center back. Walking triggers the pain, rest does give her relief. No acute injuries. Using her walking does help her ambulate better. No radiation of pain.     Review of Systems  Constitutional: Negative for chills, diaphoresis, fever, malaise/fatigue and weight loss.  HENT: Negative.   Eyes: Negative.   Respiratory: Negative.   Cardiovascular: Negative.   Gastrointestinal: Negative.   Genitourinary: Negative.   Musculoskeletal: Positive for back pain, joint pain and myalgias. Negative for neck pain.  Skin: Negative for itching and rash.  Neurological: Negative.  Negative for weakness.  Endo/Heme/Allergies: Negative for environmental allergies and polydipsia. Does not bruise/bleed easily.  Psychiatric/Behavioral: Negative.     Otherwise per HPI.  HISTORY & PERTINENT PRIOR DATA:  No specialty comments available. She reports that she quit smoking about 8 years ago. She has never used smokeless tobacco. No  results for input(s): HGBA1C, LABURIC in the last 8760 hours. Medications & Allergies reviewed per EMR Patient Active Problem List   Diagnosis Date Noted  . Bilateral primary osteoarthritis of knee 08/08/2016  . Reactive thrombocytosis 08/24/2014  . Celiac disease 11/17/2013  . Other nonspecific abnormal cardiovascular system function study 09/27/2012  . Pulmonary HTN ([place]) 09/27/2012  . Blood in stool 07/04/2012  . History of thyroid cancer- s/p total thyroidectomy 2012 07/04/2012  . Fatigue 07/04/2012  . Iron deficiency anemia 07/04/2012  . COPD (chronic obstructive pulmonary disease)- on nocturnal oxygen at home 07/04/2012  . GERD (gastroesophageal reflux disease) 07/04/2012  . Chronic anemia- symptomatic 07/03/2012  . Hypokalemia 07/03/2012  . Essential hypertension 07/03/2012   Past Medical History:  Diagnosis Date  . Anemia   . Arthritis   . Asthma   . Cancer ([place])    thyroid  . Celiac disease 11/17/2013  . COPD (chronic obstructive pulmonary disease) ([place])   . Hypertension   . Thyroid disease    Family History  Problem Relation Age of Onset  . Heart disease Mother   . Kidney failure Father   . Cancer Sister        thyroid cancer   Past Surgical History:  Procedure Laterality Date  . ABDOMINAL HYSTERECTOMY    . BREAST EXCISIONAL BIOPSY Bilateral   . CHOLECYSTECTOMY    . COLONOSCOPY  07/05/2012   Procedure: COLONOSCOPY;  Surgeon: Winfield Cunas., MD;  Location: [facility] ENDOSCOPY;  Service: Endoscopy;  Laterality: N/A;  . COLONOSCOPY N/A 03/12/2015   Procedure: COLONOSCOPY;  Surgeon: Laurence Spates, MD;  Location: [facility]  ENDOSCOPY;  Service: Endoscopy;  Laterality: N/A;  . ESOPHAGOGASTRODUODENOSCOPY  07/05/2012   Procedure: ESOPHAGOGASTRODUODENOSCOPY (EGD);  Surgeon: Winfield Cunas., MD;  Location: [facility] ENDOSCOPY;  Service: Endoscopy;  Laterality: N/A;  . HOT HEMOSTASIS N/A 03/12/2015   Procedure: HOT HEMOSTASIS (ARGON PLASMA COAGULATION/BICAP);  Surgeon: Laurence Spates,  MD;  Location: [facility] ENDOSCOPY;  Service: Endoscopy;  Laterality: N/A;  . TONSILLECTOMY     Social History   Occupational History  . Not on file.   Social History Main Topics  . Smoking status: Former Smoker    Quit date: 07/03/2008  . Smokeless tobacco: Never Used  . Alcohol use No  . Drug use: No  . Sexual activity: Not on file    OBJECTIVE:  VS:  HT:5\' 3"  (160 cm)   WT:223 lb (101.2 kg)  BMI:39.6    BP:140/90  HR:79bpm  TEMP: ( )  RESP:96 % EXAM: Findings:  Adult female.  No acute distress.  She has a moderate effusion bilaterally.  Generalized osteoarthritic bossing.  Walks with an antalgic gait.     No results found. ASSESSMENT & PLAN:     ICD-10-CM   1. Bilateral primary osteoarthritis of knee M17.0 [place] LIMITED JOINT SPACE STRUCTURES LOW BILAT(NO LINKED CHARGES)  2. Chronic pain of both knees M25.561    M25.562    G89.29   ================================================================= Bilateral primary osteoarthritis of knee Repeat bilateral cortisone injections performed today.  Patient did have questions regarding potential for potential radiofrequency ablation that she saw on TV.  Discussed that this may be worth looking into to find out more regarding the costs and the exact details.  I am unaware of any local providers that are providing this but believe this is going to be an RFA to the genicular nerves. =================================================================  Follow-up: Return in 4 months (on 08/27/2017), or repeat monovisc injections.   CMA/ATC served as Education administrator during this visit. History, Physical, and Plan performed by medical provider. Documentation and orders reviewed and attested to.      Teresa Coombs, [place] [facility] Physician

## 2017-05-22 DIAGNOSIS — E538 Deficiency of other specified B group vitamins: Secondary | ICD-10-CM | POA: Diagnosis not present

## 2017-06-19 DIAGNOSIS — E538 Deficiency of other specified B group vitamins: Secondary | ICD-10-CM | POA: Diagnosis not present

## 2017-07-18 DIAGNOSIS — E538 Deficiency of other specified B group vitamins: Secondary | ICD-10-CM | POA: Diagnosis not present

## 2017-08-23 DIAGNOSIS — E538 Deficiency of other specified B group vitamins: Secondary | ICD-10-CM | POA: Diagnosis not present

## 2017-08-31 ENCOUNTER — Encounter: Payer: Self-pay | Admitting: Sports Medicine

## 2017-08-31 ENCOUNTER — Ambulatory Visit (INDEPENDENT_AMBULATORY_CARE_PROVIDER_SITE_OTHER): Payer: Medicare Other | Admitting: Sports Medicine

## 2017-08-31 ENCOUNTER — Ambulatory Visit: Payer: Self-pay

## 2017-08-31 VITALS — BP 140/90 | HR 77 | Ht 63.0 in | Wt 226.6 lb

## 2017-08-31 DIAGNOSIS — M170 Bilateral primary osteoarthritis of knee: Secondary | ICD-10-CM | POA: Diagnosis not present

## 2017-08-31 NOTE — Procedures (Signed)
PROCEDURE NOTE -  ULTRASOUND GUIDEDInjection: Bilateral knee, Monovisc Images were obtained and interpreted by myself, Teresa Coombs, DO  Images have been saved and stored to PACS system. Images obtained on: GE S7 Ultrasound machine  ULTRASOUND FINDINGS:  Right knee has a very small effusion with generalized synovitis Left knee has small effusion with generalized synovitis that is worse than the right.  DESCRIPTION OF PROCEDURE:  The patient's clinical condition is marked by substantial pain and/or significant functional disability. Other conservative therapy has not provided relief, is contraindicated, or not appropriate. There is a reasonable likelihood that injection will significantly improve the patient's pain and/or functional impairment.  After discussing the risks, benefits and expected outcomes of the injection and all questions were reviewed and answered, the patient wished to undergo the above named procedure. Verbal consent was obtained.  The ultrasound was used to identify the target structure and adjacent neurovascular structures. The skin was then prepped in sterile fashion and the target structure was injected under direct visualization using sterile technique as below:  Bilateral knees, identical procedures performed on each side PREP: Alcohol, Ethel Chloride,  APPROACH: superiolateral, single injection, 22g 1.5in. INJECTATE: 88 mg ASPIRATE: n/a  DRESSING: Band-Aid and the patient's own compressive wraps    Post procedural instructions including recommending icing and warning signs for infection were reviewed.  This procedure was well tolerated and there were no complications.   IMPRESSION: Succesful US Guided Injection -Monovisc injections

## 2017-08-31 NOTE — Progress Notes (Signed)
[name], [place] at [place] - 76 y.o. female MRN [id number]  Date of birth: 07-Dec-1940   Scribe for today's visit: Josepha Pigg, CMA    SUBJECTIVE:  Angelica [name] is here for Follow-up (osteoarthritis of both knees)  Compared to the last office visit, her previously described symptoms are worsening, its getting harder to walk, get up and down. She is unsure if there is any swelling because she has been wearing compression sleeves.  Current symptoms are severe in the LT knee and moderate in the RT knee. Pain is radiating to lower legs. She has also notice back pain after being on her feet a lot.  She had steroid injections for both knees 01/31/17 and 05/02/17. Last Monovisc inj was 02/21/17.    ROS Reports night time disturbances. Reports fevers, chills, or night sweats. Denies unexplained weight loss. Reports personal history of cancer, thyroid. Denies changes in bowel or bladder habits. Denies recent unreported falls. Denies new or worsening dyspnea or wheezing. Denies headaches or dizziness.  Denies numbness, tingling or weakness  In the extremities.  Denies dizziness or presyncopal episodes Reports lower extremity edema     HISTORY & PERTINENT PRIOR DATA:  Prior History reviewed and updated per electronic medical record.  Significant history, findings, studies and interim changes include:  reports that she quit smoking about 9 years ago. she has never used smokeless tobacco. No results for input(s): HGBA1C, LABURIC, CREATINE in the last 8760 hours. No specialty comments available. Problem  Bilateral Primary Osteoarthritis of Knee   Recent injections: Steroid- 01/31/17 Monovisc - 02/21/17 Steroid - 05/02/17.  Monovisc - 08/31/17      OBJECTIVE:  VS:  HT:5' 3"  (160 cm)   WT:226 lb 9.6 oz (102.8 kg)  BMI:40.15    BP:140/90  HR:77bpm  TEMP: ( )  RESP:95 %  PHYSICAL  EXAM: Constitutional: WDWN, Non-toxic appearing. Psychiatric: Alert & appropriately interactive. Not depressed or anxious appearing. Respiratory: No increased work of breathing. Trachea Midline Eyes: Pupils are equal. EOM intact without nystagmus. No scleral icterus  Bilateral knees, she has slight genu valgus bilaterally.  Walks with a cane.  Full flexion and extension.  Small effusion with synovitis.  Ligamentously stable with pain and 2-3 mm of opening but solid endpoint.  Pain with McMurray's.  Extensor mechanism intact.  Overlying skin changes.   ASSESSMENT & PLAN:   1. Bilateral primary osteoarthritis of knee    PLAN:     Bilateral primary osteoarthritis of knee Bilateral knee injections with Monovisc performed again today.  She is doing quite well with Visco supplementation and intermittent cortisone.  Repeat Visco today and plan to repeat again in 6 months given the good relief that she receives from it although it does tend to wear off by the 79-monthtimeframe.   ++++++++++++++++++++++++++++++++++++++++++++ Orders & Meds: Orders Placed This Encounter  Procedures  . [place]GUIDED NEEDLE PLACEMENT(NO LINKED CHARGES)    No orders of the defined types were placed in this encounter.   ++++++++++++++++++++++++++++++++++++++++++++ Follow-up: Return in about 3 months (around 11/29/2017).   Pertinent documentation may be included in additional procedure notes, imaging studies, problem based documentation and patient instructions. Please see these sections of the encounter for additional information regarding this visit. CMA/ATC served as sEducation administratorduring this visit. History, Physical, and Plan performed by medical provider. Documentation and orders reviewed and attested to.      MGerda Diss DO  [facility] Phys[facility]

## 2017-08-31 NOTE — Assessment & Plan Note (Addendum)
Bilateral knee injections with Monovisc performed again today.  She is doing quite well with Visco supplementation and intermittent cortisone.  Repeat Visco today and plan to repeat again in 6 months given the good relief that she receives from it although it does tend to wear off by the 74-month timeframe.

## 2017-08-31 NOTE — Patient Instructions (Signed)

## 2017-09-03 ENCOUNTER — Ambulatory Visit: Payer: Medicare Other | Admitting: Sports Medicine

## 2017-09-20 DIAGNOSIS — E538 Deficiency of other specified B group vitamins: Secondary | ICD-10-CM | POA: Diagnosis not present

## 2017-10-02 DIAGNOSIS — E559 Vitamin D deficiency, unspecified: Secondary | ICD-10-CM | POA: Diagnosis not present

## 2017-10-02 DIAGNOSIS — R7309 Other abnormal glucose: Secondary | ICD-10-CM | POA: Diagnosis not present

## 2017-10-02 DIAGNOSIS — I10 Essential (primary) hypertension: Secondary | ICD-10-CM | POA: Diagnosis not present

## 2017-10-02 DIAGNOSIS — E538 Deficiency of other specified B group vitamins: Secondary | ICD-10-CM | POA: Diagnosis not present

## 2017-10-02 DIAGNOSIS — E890 Postprocedural hypothyroidism: Secondary | ICD-10-CM | POA: Diagnosis not present

## 2017-10-02 DIAGNOSIS — R82998 Other abnormal findings in urine: Secondary | ICD-10-CM | POA: Diagnosis not present

## 2017-10-02 DIAGNOSIS — M810 Age-related osteoporosis without current pathological fracture: Secondary | ICD-10-CM | POA: Diagnosis not present

## 2017-10-09 DIAGNOSIS — M1711 Unilateral primary osteoarthritis, right knee: Secondary | ICD-10-CM | POA: Diagnosis not present

## 2017-10-09 DIAGNOSIS — H6691 Otitis media, unspecified, right ear: Secondary | ICD-10-CM | POA: Diagnosis not present

## 2017-10-09 DIAGNOSIS — M1712 Unilateral primary osteoarthritis, left knee: Secondary | ICD-10-CM | POA: Diagnosis not present

## 2017-10-09 DIAGNOSIS — Z0000 Encounter for general adult medical examination without abnormal findings: Secondary | ICD-10-CM | POA: Diagnosis not present

## 2017-10-09 DIAGNOSIS — Z6841 Body Mass Index (BMI) 40.0 and over, adult: Secondary | ICD-10-CM | POA: Diagnosis not present

## 2017-10-09 DIAGNOSIS — M1611 Unilateral primary osteoarthritis, right hip: Secondary | ICD-10-CM | POA: Diagnosis not present

## 2017-10-09 DIAGNOSIS — M1612 Unilateral primary osteoarthritis, left hip: Secondary | ICD-10-CM | POA: Diagnosis not present

## 2017-10-09 DIAGNOSIS — I10 Essential (primary) hypertension: Secondary | ICD-10-CM | POA: Diagnosis not present

## 2017-10-09 DIAGNOSIS — E538 Deficiency of other specified B group vitamins: Secondary | ICD-10-CM | POA: Diagnosis not present

## 2017-10-09 DIAGNOSIS — E559 Vitamin D deficiency, unspecified: Secondary | ICD-10-CM | POA: Diagnosis not present

## 2017-10-09 DIAGNOSIS — Z1389 Encounter for screening for other disorder: Secondary | ICD-10-CM | POA: Diagnosis not present

## 2017-10-09 DIAGNOSIS — E119 Type 2 diabetes mellitus without complications: Secondary | ICD-10-CM | POA: Diagnosis not present

## 2017-10-19 DIAGNOSIS — Z1212 Encounter for screening for malignant neoplasm of rectum: Secondary | ICD-10-CM | POA: Diagnosis not present

## 2017-10-25 DIAGNOSIS — E538 Deficiency of other specified B group vitamins: Secondary | ICD-10-CM | POA: Diagnosis not present

## 2017-11-21 DIAGNOSIS — E538 Deficiency of other specified B group vitamins: Secondary | ICD-10-CM | POA: Diagnosis not present

## 2017-11-30 ENCOUNTER — Ambulatory Visit (INDEPENDENT_AMBULATORY_CARE_PROVIDER_SITE_OTHER): Payer: Medicare Other | Admitting: Sports Medicine

## 2017-11-30 ENCOUNTER — Encounter: Payer: Self-pay | Admitting: Sports Medicine

## 2017-11-30 ENCOUNTER — Ambulatory Visit: Payer: Medicare Other

## 2017-11-30 VITALS — BP 126/86 | HR 88 | Ht 63.0 in | Wt 230.2 lb

## 2017-11-30 DIAGNOSIS — M25562 Pain in left knee: Secondary | ICD-10-CM | POA: Diagnosis not present

## 2017-11-30 DIAGNOSIS — M545 Low back pain: Secondary | ICD-10-CM | POA: Diagnosis not present

## 2017-11-30 DIAGNOSIS — M25561 Pain in right knee: Secondary | ICD-10-CM | POA: Diagnosis not present

## 2017-11-30 DIAGNOSIS — M170 Bilateral primary osteoarthritis of knee: Secondary | ICD-10-CM | POA: Diagnosis not present

## 2017-11-30 DIAGNOSIS — G8929 Other chronic pain: Secondary | ICD-10-CM

## 2017-11-30 NOTE — Progress Notes (Signed)
Angelica [name]. Angelica [name], [place] at [facility] [phone]  Angelica [name] - 77 y.o. female MRN [id number]  Date of birth: Dec 10, 1940  Visit Date: 11/30/2017  PCP: Velna Hatchet, MD   Referred by: Velna Hatchet, MD   Scribe for today's visit: Josepha Pigg, CMA     SUBJECTIVE:  Angelica [name] is here for Follow-up (bilateral knee pain)  08/31/17: Compared to the last office visit, her previously described symptoms are worsening, its getting harder to walk, get up and down. She is unsure if there is any swelling because she has been wearing compression sleeves.  Current symptoms are severe in the LT knee and moderate in the RT knee. Pain is radiating to lower legs. She has also notice back pain after being on her feet a lot.  She had steroid injections for both knees 01/31/17 and 05/02/17. Last Monovisc inj was 02/21/17.   11/30/17: Compared to the last office visit, her previously described symptoms are worsening. She c/o increased pain in the L knee and leg. She has noticed swelling in the LLE.  She denies increased warmth or redness in the L leg. She also says that she feels like there is a knot on the back of her calf.  Current symptoms are moderate-severe (7-10/10) & are radiating to LLE She has been taking tylenol prn with some relief.   She reports that R knee pain is not as bad as her L knee.    ROS Denies night time disturbances. Denies fevers, chills, or night sweats. Denies unexplained weight loss. Reports personal history of cancer, thyroid. Denies changes in bowel or bladder habits. Denies recent unreported falls. Reports new or worsening dyspnea or wheezing. Denies headaches or dizziness.  Reports numbness, tingling or weakness  In the extremities.  Denies dizziness or presyncopal episodes Reports lower extremity edema    HISTORY & PERTINENT PRIOR DATA:  Prior History reviewed and updated per electronic medical record.    Significant history, findings, studies and interim changes include:  reports that she quit smoking about 9 years ago. she has never used smokeless tobacco. No results for input(s): HGBA1C, LABURIC, CREATINE in the last 8760 hours. No specialty comments available. No problems updated.  OBJECTIVE:  VS:  HT:5\' 3"  (160 cm)   WT:230 lb 3.2 oz (104.4 kg)  BMI:40.79    BP:126/86  HR:88bpm  TEMP: ( )  RESP:96 %   PHYSICAL EXAM: Constitutional: WDWN, Non-toxic appearing. Psychiatric: Alert & appropriately interactive.  Not depressed or anxious appearing. Respiratory: No increased work of breathing.  Trachea Midline Eyes: Pupils are equal.  EOM intact without nystagmus.  No scleral icterus  NEUROVASCULAR exam: No clubbing or cyanosis appreciated No significant venous stasis changes Capillary Refill: normal, less than 2 seconds   Bilateral knees overall well aligned scratch that bilateral knees with valgus deformity on the left that is moderate in nature.  She has pain with patellar grind and palpable crepitation with flexion and extension.  Pain with palpation of the medial and lateral joint lines bilaterally.  Right knee has a small effusion left knee has only a minimal effusion.   ASSESSMENT & PLAN:   1. Bilateral primary osteoarthritis of knee   2. Chronic pain of both knees   3. Chronic bilateral low back pain without sciatica     Orders & Meds:  Orders Placed This Encounter  Procedures  . XR Knee AP/LAT W/Sunrise Left  . XR Lumbar Spine 2-3 Views  No orders of the defined types were placed in this encounter.    PLAN: Ultimately the patient has had progression of her underlying knee osteoarthritis.  She has had good improvement with Monovisc injections but we did discuss the possibility of performing Zilretta injections versus repeating corticosteroids.  She is interested in trying a new brace and we fitted her today for a DonJoy reaction brace.  If any lack of improvement  with this could consider a formal medial off loader brace but she reported good symptom control with this current option. At this time will plan to obtain prior authorization for Zilretta and pending approval consider these injections versus intra-articular injections of standard cortisone at follow-up.  No problem-specific Assessment & Plan notes found for this encounter.   Follow-up: Return in about 2 weeks (around 12/14/2017).   Pertinent documentation may be included in additional procedure notes, imaging studies, problem based documentation and patient instructions. Please see these sections of the encounter for additional information regarding this visit. CMA/ATC served as Education administrator during this visit. History, Physical, and Plan performed by medical provider. Documentation and orders reviewed and attested to.      Gerda Diss, [facility] Physician

## 2017-12-02 NOTE — Procedures (Signed)
X-Rays obtained at [place] Interpreted by myself Gerda Diss, DO) during office visit.  Results were reviewed with the patient at the time of the visit.   Three-view x-ray of the lumbar spine obtained that shows atherosclerotic calcification of the aorta with overall well-maintained alignment.  She has a moderate degree of lumbar spondylosis diffusely with anterior osteophytes at L3-4 most notably.  She has well-maintained joint space in the limited views of the bilateral hips that are able to be visualized but this is not a dedicated hip film   IMPRESSION:  1. Lumbar spondylosis 2. Atherosclerosis of the aorta without evidence of dilation

## 2017-12-02 NOTE — Procedures (Signed)
X-Rays obtained at [place] Interpreted by myself Gerda Diss, DO) during office visit.  Results were reviewed with the patient at the time of the visit.    3 view left knee:: Significant loss of medial joint space on the left with moderate advancement of degenerative changes on the right.  She has marked subchondral sclerosis as well as osteophytic spurring in all 3 compartments bilaterally on the single AP view of the right knee.  IMPRESSION:  1. Bilateral tricompartmental osteoarthritis with significant advancement of the left medial knee

## 2017-12-14 ENCOUNTER — Ambulatory Visit (INDEPENDENT_AMBULATORY_CARE_PROVIDER_SITE_OTHER): Payer: Medicare Other | Admitting: Sports Medicine

## 2017-12-14 ENCOUNTER — Encounter: Payer: Self-pay | Admitting: Sports Medicine

## 2017-12-14 DIAGNOSIS — G8929 Other chronic pain: Secondary | ICD-10-CM

## 2017-12-14 DIAGNOSIS — R062 Wheezing: Secondary | ICD-10-CM | POA: Diagnosis not present

## 2017-12-14 DIAGNOSIS — M170 Bilateral primary osteoarthritis of knee: Secondary | ICD-10-CM

## 2017-12-14 DIAGNOSIS — J410 Simple chronic bronchitis: Secondary | ICD-10-CM | POA: Diagnosis not present

## 2017-12-14 DIAGNOSIS — M25561 Pain in right knee: Secondary | ICD-10-CM | POA: Diagnosis not present

## 2017-12-14 DIAGNOSIS — M25562 Pain in left knee: Secondary | ICD-10-CM

## 2017-12-14 NOTE — Progress Notes (Signed)
D. , DO  [name] [phone]  Angelica [name] - 76 y.o. female MRN [id number]  Date of birth: 04/05/1941  Visit Date: 12/14/2017  PCP: Holwerda, Scott, MD   Referred by: Holwerda, Scott, MD  Please see additional documentation for HPI, review of systems.   HISTORY & PERTINENT PRIOR DATA:  Prior History reviewed and updated per electronic medical record.  Significant/pertinent history, findings, studies include:  reports that she quit smoking about 9 years ago. She has never used smokeless tobacco. No results for input(s): HGBA1C, LABURIC, CREATINE in the last 8760 hours. 12/05/17 - Medicare will cover Zilretta at 80% once deductible has been met. Secondary, BCBS, will cover the medicare deductible and coinsurance. -BSC No problems updated.  OBJECTIVE:  VS:  HT:    WT:   BMI:     BP:   HR: bpm  TEMP: ( )  RESP:    PHYSICAL EXAM: Constitutional: WDWN, Non-toxic appearing. Psychiatric: Alert & appropriately interactive.  Not depressed or anxious appearing. Respiratory: No increased work of breathing.  Trachea Midline Eyes: Pupils are equal.  EOM intact without nystagmus.  No scleral icterus  Vascular Exam: warm to touch moderate pitting edema pretibial bilaterally  lower extremity neuro exam: unremarkable normal strength normal sensation Neg SLR  MSK Exam: Generalized osteophytic bossing of bilateral knees left worse than right.  Small effusion on the left, moderate on the right.  Marked crepitation with axial load and with patellar grind.  Lung exam:mild wheezes but minimal and end expiratory.  Good air movement. No crackles Heart: Normal rate and rhythm. No murmur   ASSESSMENT & PLAN:   1. Bilateral primary osteoarthritis of knee   2. Chronic pain of both knees   3. Wheezing   4. Simple chronic bronchitis (HCC)     PLAN: We will plan on injecting both knees today however she would like to  hold off on this given the improvement with the braces that she has experienced.  Zilretta injections were further discussed and is something that she may be interested in but at this point she will plan to give this several more weeks and let us know in the interim if she needs further intervention.  Recommend follow-up with PCP for ongoing congestion issues.  Did discuss using over-the-counter antihistamines  Follow-up: Return in about 2 months (around 02/13/2018).     Please see additional documentation for Objective, Assessment and Plan sections. Pertinent additional documentation may be included in corresponding procedure notes, imaging studies, problem based documentation and patient instructions. Please see these sections of the encounter for additional information regarding this visit.  CMA/ATC served as scribe during this visit. History, Physical, and Plan performed by medical provider. Documentation and orders reviewed and attested to.       D , DO    [facility] Physician     

## 2017-12-14 NOTE — Progress Notes (Signed)
  Angelica [name] - 77 y.o. female MRN [id number]  Date of birth: 04-Apr-1941  Scribe for today's visit: Josepha Pigg, CMA     SUBJECTIVE:  Angelica [name] is here for Follow-up (bilateral knee pain)  08/31/17: Compared to the last office visit, her previously described symptoms are worsening, its getting harder to walk, get up and down. She is unsure if there is any swelling because she has been wearing compression sleeves.  Current symptoms are severe in the LT knee and moderate in the RT knee. Pain is radiating to lower legs. She has also notice back pain after being on her feet a lot.  She had steroid injections for both knees 01/31/17 and 05/02/17. Last Monovisc inj was 02/21/17.   11/30/17: Compared to the last office visit, her previously described symptoms are worsening. She c/o increased pain in the L knee and leg. She has noticed swelling in the LLE.  She denies increased warmth or redness in the L leg. She also says that she feels like there is a knot on the back of her calf.  Current symptoms are moderate-severe (7-10/10) & are radiating to LLE She has been taking tylenol prn with some relief.  She reports that R knee pain is not as bad as her L knee.   12/14/17: Compared to the last office visit, her previously described symptoms are improving. She reports that the R knee isn't bothering her and her L knee pain is a lot better but not completely resolved.  Current symptoms are mild & are radiating to lower legs.  She has been wearing her reaction knee brace and feels that this has been beneficial. She takes Tylenol prn with minimal relief.   ROS Denies night time disturbances. Denies fevers, chills, or night sweats. Denies unexplained weight loss. Reports personal history of cancer. Denies changes in bowel or bladder habits. Denies recent unreported falls. Reports new or worsening dyspnea or wheezing. Denies headaches or dizziness.  Reports numbness, tingling or weakness  In the  extremities.  Denies dizziness or presyncopal episodes Reports lower extremity edema      Please see additional documentation for Objective, Assessment and Plan sections. Pertinent additional documentation may be included in corresponding procedure notes, imaging studies, problem based documentation and patient instructions. Please see these sections of the encounter for additional information regarding this visit.  CMA/ATC served as Education administrator during this visit. History, Physical, and Plan performed by medical provider. Documentation and orders reviewed and attested to.      Gerda Diss, [facility] Physician

## 2017-12-14 NOTE — Patient Instructions (Signed)
Try over the counter Claritin for your allergies and see if this is hepful, if not please follow up with Dr. Ardeth Perfect.  Call us if you want the Zilretta injections sooner

## 2017-12-19 DIAGNOSIS — E538 Deficiency of other specified B group vitamins: Secondary | ICD-10-CM | POA: Diagnosis not present

## 2017-12-22 ENCOUNTER — Encounter: Payer: Self-pay | Admitting: Sports Medicine

## 2018-01-08 DIAGNOSIS — D509 Iron deficiency anemia, unspecified: Secondary | ICD-10-CM | POA: Diagnosis not present

## 2018-01-08 DIAGNOSIS — F330 Major depressive disorder, recurrent, mild: Secondary | ICD-10-CM | POA: Diagnosis not present

## 2018-01-08 DIAGNOSIS — Z6841 Body Mass Index (BMI) 40.0 and over, adult: Secondary | ICD-10-CM | POA: Diagnosis not present

## 2018-01-08 DIAGNOSIS — M169 Osteoarthritis of hip, unspecified: Secondary | ICD-10-CM | POA: Diagnosis not present

## 2018-01-08 DIAGNOSIS — L309 Dermatitis, unspecified: Secondary | ICD-10-CM | POA: Diagnosis not present

## 2018-01-08 DIAGNOSIS — J329 Chronic sinusitis, unspecified: Secondary | ICD-10-CM | POA: Diagnosis not present

## 2018-01-08 DIAGNOSIS — J449 Chronic obstructive pulmonary disease, unspecified: Secondary | ICD-10-CM | POA: Diagnosis not present

## 2018-01-08 DIAGNOSIS — E1169 Type 2 diabetes mellitus with other specified complication: Secondary | ICD-10-CM | POA: Diagnosis not present

## 2018-01-08 DIAGNOSIS — E890 Postprocedural hypothyroidism: Secondary | ICD-10-CM | POA: Diagnosis not present

## 2018-01-08 DIAGNOSIS — M810 Age-related osteoporosis without current pathological fracture: Secondary | ICD-10-CM | POA: Diagnosis not present

## 2018-01-08 DIAGNOSIS — K219 Gastro-esophageal reflux disease without esophagitis: Secondary | ICD-10-CM | POA: Diagnosis not present

## 2018-01-22 DIAGNOSIS — E538 Deficiency of other specified B group vitamins: Secondary | ICD-10-CM | POA: Diagnosis not present

## 2018-02-13 ENCOUNTER — Ambulatory Visit (INDEPENDENT_AMBULATORY_CARE_PROVIDER_SITE_OTHER): Payer: Medicare Other | Admitting: Sports Medicine

## 2018-02-13 ENCOUNTER — Encounter: Payer: Self-pay | Admitting: Sports Medicine

## 2018-02-13 VITALS — BP 120/86 | HR 84 | Ht 63.0 in | Wt 228.6 lb

## 2018-02-13 DIAGNOSIS — M25561 Pain in right knee: Secondary | ICD-10-CM | POA: Diagnosis not present

## 2018-02-13 DIAGNOSIS — M25562 Pain in left knee: Secondary | ICD-10-CM

## 2018-02-13 DIAGNOSIS — M170 Bilateral primary osteoarthritis of knee: Secondary | ICD-10-CM

## 2018-02-13 DIAGNOSIS — G8929 Other chronic pain: Secondary | ICD-10-CM

## 2018-02-13 NOTE — Progress Notes (Signed)
Juanda Bond. Lamiyah Schlotter, [place] at [facility] [phone]  NEGAR SIELER - 77 y.o. female MRN [id number]  Date of birth: 09-Dec-1940  Visit Date: 02/13/2018  PCP: Velna Hatchet, MD   Referred by: Velna Hatchet, MD  Scribe for today's visit: Josepha Pigg, CMA     SUBJECTIVE:  ALLESSANDRA BERNARDI is here for No chief complaint on file.  08/31/17: Compared to the last office visit, her previously described symptoms are worsening, its getting harder to walk, get up and down. She is unsure if there is any swelling because she has been wearing compression sleeves.  Current symptoms are severe in the LT knee and moderate in the RT knee. Pain is radiating to lower legs. She has also notice back pain after being on her feet a lot.  She had steroid injections for both knees 01/31/17 and 05/02/17. Last Monovisc inj was 02/21/17.   11/30/17: Compared to the last office visit, her previously described symptoms are worsening. She c/o increased pain in the L knee and leg. She has noticed swelling in the LLE.  She denies increased warmth or redness in the L leg. She also says that she feels like there is a knot on the back of her calf.  Current symptoms are moderate-severe (7-10/10) & are radiating to LLE She has been taking tylenol prn with some relief.  She reports that R knee pain is not as bad as her L knee.   12/14/17: Compared to the last office visit, her previously described symptoms are improving. She reports that the R knee isn't bothering her and her L knee pain is a lot better but not completely resolved.  Current symptoms are mild & are radiating to lower legs.  She has been wearing her reaction knee brace and feels that this has been beneficial. She takes Tylenol prn with minimal relief.   02/13/2018: Compared to the last office visit, her previously described symptoms are improving, pain level has decreased/improved.  Current symptoms are mild & are  radiating to lower legs but even this has improved. She denies swelling. Sx seem to flare up when its cold or raining.  She has been wearing knee brace and feels that it has been beneficial.  ROS Denies night time disturbances. Denies fevers, chills, or night sweats. Denies unexplained weight loss. Reports personal history of cancer. Denies changes in bowel or bladder habits. Denies recent unreported falls. Reports new or worsening dyspnea or wheezing. Denies headaches or dizziness.  Reports numbness, tingling or weakness  In the extremities.  Denies dizziness or presyncopal episodes Reports lower extremity edema    HISTORY & PERTINENT PRIOR DATA:  Prior History reviewed and updated per electronic medical record.  Significant/pertinent history, findings, studies include:  reports that she quit smoking about 9 years ago. She has never used smokeless tobacco. No results for input(s): HGBA1C, LABURIC, CREATINE in the last 8760 hours. 12/05/17 - Medicare will cover Zilretta at 80% once deductible has been met. Secondary, BCBS, will cover the medicare deductible and coinsurance. -BSC No problems updated.  OBJECTIVE:  VS:  HT:_0  (160 cm)   WT:228 lb 9.6 oz (103.7 kg)  BMI:40.5    BP:120/86  HR:84bpm  TEMP: ( )  RESP:95 %   PHYSICAL EXAM: Constitutional: WDWN, Non-toxic appearing. Psychiatric: Alert & appropriately interactive.  Not depressed or anxious appearing. Respiratory: No increased work of breathing.  Trachea Midline Eyes: Pupils are equal.  EOM intact without nystagmus.  No scleral icterus  Vascular Exam: warm to touch no edema  lower extremity neuro exam: unremarkable  MSK Exam: Generalized osteophytic bossing with positive patellar grind and pain with medial and lateral joint line palpation.   ASSESSMENT & PLAN:   1. Bilateral primary osteoarthritis of knee   2. Chronic pain of both knees     PLAN: Overall she is actually doing quite well considering he  would like to hold off on injections today.  We will plan to follow-up with her in 2 weeks and can consider Zilretta injections versus repeat Monovisc although she would like to do the best with the Zilretta.  She is slightly hesitant however due to the new nature of the medicine and the characteristics of the medicine and reported systemic side effects were discussed in detail with her today.  She will call sooner if any worsening features for repeat injections at any time.  Follow-up: Return in about 2 months (around 04/24/2018) for consideration of repeat injections.      Please see additional documentation for Objective, Assessment and Plan sections. Pertinent additional documentation may be included in corresponding procedure notes, imaging studies, problem based documentation and patient instructions. Please see these sections of the encounter for additional information regarding this visit.  CMA/ATC served as Education administrator during this visit. History, Physical, and Plan performed by medical provider. Documentation and orders reviewed and attested to.      Gerda Diss, [place] [facility] Physician

## 2018-02-24 ENCOUNTER — Encounter: Payer: Self-pay | Admitting: Sports Medicine

## 2018-03-05 ENCOUNTER — Other Ambulatory Visit: Payer: Self-pay | Admitting: Internal Medicine

## 2018-03-05 DIAGNOSIS — Z1231 Encounter for screening mammogram for malignant neoplasm of breast: Secondary | ICD-10-CM

## 2018-03-05 DIAGNOSIS — E538 Deficiency of other specified B group vitamins: Secondary | ICD-10-CM | POA: Diagnosis not present

## 2018-04-05 ENCOUNTER — Ambulatory Visit
Admission: RE | Admit: 2018-04-05 | Discharge: 2018-04-05 | Disposition: A | Discharge status: Home / Self Care | Payer: Medicare Other | Source: Ambulatory Visit | Attending: Internal Medicine | Admitting: Internal Medicine

## 2018-04-05 DIAGNOSIS — Z1231 Encounter for screening mammogram for malignant neoplasm of breast: Secondary | ICD-10-CM | POA: Diagnosis not present

## 2018-04-10 DIAGNOSIS — E538 Deficiency of other specified B group vitamins: Secondary | ICD-10-CM | POA: Diagnosis not present

## 2018-04-17 ENCOUNTER — Encounter: Payer: Self-pay | Admitting: Sports Medicine

## 2018-04-17 ENCOUNTER — Ambulatory Visit (INDEPENDENT_AMBULATORY_CARE_PROVIDER_SITE_OTHER): Payer: Medicare Other | Admitting: Sports Medicine

## 2018-04-17 DIAGNOSIS — M170 Bilateral primary osteoarthritis of knee: Secondary | ICD-10-CM | POA: Diagnosis not present

## 2018-04-17 DIAGNOSIS — M25561 Pain in right knee: Secondary | ICD-10-CM

## 2018-04-17 DIAGNOSIS — G8929 Other chronic pain: Secondary | ICD-10-CM | POA: Diagnosis not present

## 2018-04-17 DIAGNOSIS — M25562 Pain in left knee: Secondary | ICD-10-CM

## 2018-04-17 NOTE — Progress Notes (Signed)
Angelica [name]. Angelica [name], [place] at [facility] [phone]  Angelica [name] - 77 y.o. female MRN [id number]  Date of birth: May 27, 1941  Visit Date: 04/17/2018  PCP: Angelica Hatchet, MD   Referred by: Angelica Hatchet, MD  Scribe(s) for today's visit: Angelica [name], CMA  SUBJECTIVE:  Angelica [name] is here for No chief complaint on file.   08/31/17: Compared to the last office visit, her previously described symptoms are worsening, its getting harder to walk, get up and down. She is unsure if there is any swelling because she has been wearing compression sleeves.  Current symptoms are severe in the LT knee and moderate in the RT knee. Pain is radiating to lower legs. She has also notice back pain after being on her feet a lot.  She had steroid injections for both knees 01/31/17 and 05/02/17. Last Monovisc inj was 02/21/17.   11/30/17: Compared to the last office visit, her previously described symptoms are worsening. She c/o increased pain in the L knee and leg. She has noticed swelling in the LLE.  She denies increased warmth or redness in the L leg. She also says that she feels like there is a knot on the back of her calf.  Current symptoms are moderate-severe (7-10/10) & are radiating to LLE She has been taking tylenol prn with some relief.  She reports that R knee pain is not as bad as her L knee.   12/14/17: Compared to the last office visit, her previously described symptoms are improving. She reports that the R knee isn't bothering her and her L knee pain is a lot better but not completely resolved.  Current symptoms are mild & are radiating to lower legs.  She has been wearing her reaction knee brace and feels that this has been beneficial. She takes Tylenol prn with minimal relief.   02/13/2018: Compared to the last office visit, her previously described symptoms are improving, pain level has decreased/improved.  Current symptoms are mild & are  radiating to lower legs but even this has improved. She denies swelling. Sx seem to flare up when its cold or raining.  She has been wearing knee brace and feels that it has been beneficial.  04/17/2018: Compared to the last office visit, her previously described symptoms show no change. She had increased pain yesterday but she was on her feet more.  Current symptoms are mild & are radiating to the L LE. She reports numbness after standing for an extended period of time.  She has been wearing reaction knee brace on L knee and compression sleeve on R knee. She c/o stiffness in ankles and knees.   She reports feeling off balance when first standing up, doesn't feel like its related to her joints.   Monovisc inj 08/31/2017.  XR L-spine and L knee 11/30/17.    REVIEW OF SYSTEMS: Denies night time disturbances. Denies fevers, chills, or night sweats. Denies unexplained weight loss. Reports personal history of cancer. Denies changes in bowel or bladder habits. Denies recent unreported falls. Denies new or worsening dyspnea or wheezing. Denies headaches or dizziness.  Reports numbness, tingling or weakness  In the extremities.  Reports dizziness or presyncopal episodes Reports lower extremity edema - L ankle   HISTORY & PERTINENT PRIOR DATA:  Prior History reviewed and updated per electronic medical record.  Significant/pertinent history, findings, studies include:  reports that she quit smoking about 9 years ago. She has never used smokeless  tobacco. No results for input(s): HGBA1C, LABURIC, CREATINE in the last 8760 hours. 12/05/17 - Medicare will cover Zilretta at 80% once deductible has been met. Secondary, BC[employer], will cover the medicare deductible and coinsurance. -BSC No problems updated.  OBJECTIVE:  VS:  HT:    WT:   BMI:     BP:   HR: bpm  TEMP: ( )  RESP:    PHYSICAL EXAM: Constitutional: WDWN, Non-toxic appearing. Psychiatric: Alert & appropriately interactive.  Not  depressed or anxious appearing. Respiratory: No increased work of breathing.  Trachea Midline Eyes: Pupils are equal.  EOM intact without nystagmus.  No scleral icterus  Vascular Exam: warm to touch no edema  lower extremity neuro exam: unremarkable  MSK Exam: Bilateral knees with generalized osteoarthritic bossing.  She has a small effusion on the left and a moderate effusion on the right.  Good flexion and extension without significant pain.  Positive patellar grind.  No significant lower extremity edema.   ASSESSMENT & PLAN:   1. Bilateral primary osteoarthritis of knee   2. Chronic pain of both knees     PLAN: Bilateral knee osteoarthritis that is done well with injections in the past.  She is overall doing quite well today.  Will defer injections until she feels as though she is worsening.  She will follow-up with [place] only as needed for these injections.  Can consider Zilretta at that time but she is responded well to standard corticosteroids and either these options will be considered at follow-up.  Follow-up: Return if symptoms worsen or fail to improve.      Please see additional documentation for Objective, Assessment and Plan sections. Pertinent additional documentation may be included in corresponding procedure notes, imaging studies, problem based documentation and patient instructions. Please see these sections of the encounter for additional information regarding this visit.  CMA/ATC served as Education administrator during this visit. History, Physical, and Plan performed by medical provider. Documentation and orders reviewed and attested to.      Gerda Diss, [place] [facility] Physician

## 2018-04-30 DIAGNOSIS — H04123 Dry eye syndrome of bilateral lacrimal glands: Secondary | ICD-10-CM | POA: Diagnosis not present

## 2018-04-30 DIAGNOSIS — R7303 Prediabetes: Secondary | ICD-10-CM | POA: Diagnosis not present

## 2018-04-30 DIAGNOSIS — D3132 Benign neoplasm of left choroid: Secondary | ICD-10-CM | POA: Diagnosis not present

## 2018-04-30 DIAGNOSIS — Z961 Presence of intraocular lens: Secondary | ICD-10-CM | POA: Diagnosis not present

## 2018-04-30 DIAGNOSIS — H10413 Chronic giant papillary conjunctivitis, bilateral: Secondary | ICD-10-CM | POA: Diagnosis not present

## 2018-05-08 DIAGNOSIS — E538 Deficiency of other specified B group vitamins: Secondary | ICD-10-CM | POA: Diagnosis not present

## 2018-05-08 DIAGNOSIS — K219 Gastro-esophageal reflux disease without esophagitis: Secondary | ICD-10-CM | POA: Diagnosis not present

## 2018-05-08 DIAGNOSIS — T148XXA Other injury of unspecified body region, initial encounter: Secondary | ICD-10-CM | POA: Diagnosis not present

## 2018-05-08 DIAGNOSIS — M810 Age-related osteoporosis without current pathological fracture: Secondary | ICD-10-CM | POA: Diagnosis not present

## 2018-05-08 DIAGNOSIS — Z6841 Body Mass Index (BMI) 40.0 and over, adult: Secondary | ICD-10-CM | POA: Diagnosis not present

## 2018-05-08 DIAGNOSIS — E559 Vitamin D deficiency, unspecified: Secondary | ICD-10-CM | POA: Diagnosis not present

## 2018-05-08 DIAGNOSIS — M542 Cervicalgia: Secondary | ICD-10-CM | POA: Diagnosis not present

## 2018-05-08 DIAGNOSIS — E1169 Type 2 diabetes mellitus with other specified complication: Secondary | ICD-10-CM | POA: Diagnosis not present

## 2018-05-08 DIAGNOSIS — F330 Major depressive disorder, recurrent, mild: Secondary | ICD-10-CM | POA: Diagnosis not present

## 2018-05-09 ENCOUNTER — Other Ambulatory Visit: Payer: Self-pay | Admitting: Internal Medicine

## 2018-05-09 DIAGNOSIS — E890 Postprocedural hypothyroidism: Secondary | ICD-10-CM

## 2018-05-15 ENCOUNTER — Ambulatory Visit
Admission: RE | Admit: 2018-05-15 | Discharge: 2018-05-15 | Disposition: A | Discharge status: Home / Self Care | Payer: Medicare Other | Source: Ambulatory Visit | Attending: Internal Medicine | Admitting: Internal Medicine

## 2018-05-15 DIAGNOSIS — E890 Postprocedural hypothyroidism: Secondary | ICD-10-CM

## 2018-05-15 DIAGNOSIS — E538 Deficiency of other specified B group vitamins: Secondary | ICD-10-CM | POA: Diagnosis not present

## 2018-05-15 DIAGNOSIS — E039 Hypothyroidism, unspecified: Secondary | ICD-10-CM | POA: Diagnosis not present

## 2018-06-20 DIAGNOSIS — E538 Deficiency of other specified B group vitamins: Secondary | ICD-10-CM | POA: Diagnosis not present

## 2018-07-24 DIAGNOSIS — E538 Deficiency of other specified B group vitamins: Secondary | ICD-10-CM | POA: Diagnosis not present

## 2018-08-28 DIAGNOSIS — E538 Deficiency of other specified B group vitamins: Secondary | ICD-10-CM | POA: Diagnosis not present

## 2018-10-04 DIAGNOSIS — R82998 Other abnormal findings in urine: Secondary | ICD-10-CM | POA: Diagnosis not present

## 2018-10-04 DIAGNOSIS — I10 Essential (primary) hypertension: Secondary | ICD-10-CM | POA: Diagnosis not present

## 2018-10-04 DIAGNOSIS — E559 Vitamin D deficiency, unspecified: Secondary | ICD-10-CM | POA: Diagnosis not present

## 2018-10-04 DIAGNOSIS — E538 Deficiency of other specified B group vitamins: Secondary | ICD-10-CM | POA: Diagnosis not present

## 2018-10-04 DIAGNOSIS — E890 Postprocedural hypothyroidism: Secondary | ICD-10-CM | POA: Diagnosis not present

## 2018-10-04 DIAGNOSIS — E1169 Type 2 diabetes mellitus with other specified complication: Secondary | ICD-10-CM | POA: Diagnosis not present

## 2018-10-11 DIAGNOSIS — M179 Osteoarthritis of knee, unspecified: Secondary | ICD-10-CM | POA: Diagnosis not present

## 2018-10-11 DIAGNOSIS — E559 Vitamin D deficiency, unspecified: Secondary | ICD-10-CM | POA: Diagnosis not present

## 2018-10-11 DIAGNOSIS — Z0000 Encounter for general adult medical examination without abnormal findings: Secondary | ICD-10-CM | POA: Diagnosis not present

## 2018-10-11 DIAGNOSIS — E538 Deficiency of other specified B group vitamins: Secondary | ICD-10-CM | POA: Diagnosis not present

## 2018-10-11 DIAGNOSIS — D72829 Elevated white blood cell count, unspecified: Secondary | ICD-10-CM | POA: Diagnosis not present

## 2018-10-11 DIAGNOSIS — Z1389 Encounter for screening for other disorder: Secondary | ICD-10-CM | POA: Diagnosis not present

## 2018-10-11 DIAGNOSIS — D692 Other nonthrombocytopenic purpura: Secondary | ICD-10-CM | POA: Diagnosis not present

## 2018-10-11 DIAGNOSIS — L988 Other specified disorders of the skin and subcutaneous tissue: Secondary | ICD-10-CM | POA: Diagnosis not present

## 2018-10-11 DIAGNOSIS — Z6841 Body Mass Index (BMI) 40.0 and over, adult: Secondary | ICD-10-CM | POA: Diagnosis not present

## 2018-10-11 DIAGNOSIS — F330 Major depressive disorder, recurrent, mild: Secondary | ICD-10-CM | POA: Diagnosis not present

## 2018-10-11 DIAGNOSIS — M169 Osteoarthritis of hip, unspecified: Secondary | ICD-10-CM | POA: Diagnosis not present

## 2018-10-11 DIAGNOSIS — R82998 Other abnormal findings in urine: Secondary | ICD-10-CM | POA: Diagnosis not present

## 2018-10-11 DIAGNOSIS — I10 Essential (primary) hypertension: Secondary | ICD-10-CM | POA: Diagnosis not present

## 2018-10-16 DIAGNOSIS — Z1212 Encounter for screening for malignant neoplasm of rectum: Secondary | ICD-10-CM | POA: Diagnosis not present

## 2018-10-23 ENCOUNTER — Encounter (HOSPITAL_COMMUNITY): Payer: Self-pay | Admitting: Emergency Medicine

## 2018-10-23 ENCOUNTER — Other Ambulatory Visit: Payer: Self-pay

## 2018-10-23 ENCOUNTER — Observation Stay (HOSPITAL_COMMUNITY)
Admission: EM | Admit: 2018-10-23 | Discharge: 2018-10-24 | Disposition: A | Discharge status: Home / Self Care | Payer: Medicare Other | Attending: Internal Medicine | Admitting: Internal Medicine

## 2018-10-23 DIAGNOSIS — Z85850 Personal history of malignant neoplasm of thyroid: Secondary | ICD-10-CM

## 2018-10-23 DIAGNOSIS — Z9981 Dependence on supplemental oxygen: Secondary | ICD-10-CM | POA: Diagnosis not present

## 2018-10-23 DIAGNOSIS — Z79890 Hormone replacement therapy (postmenopausal): Secondary | ICD-10-CM | POA: Insufficient documentation

## 2018-10-23 DIAGNOSIS — Z79899 Other long term (current) drug therapy: Secondary | ICD-10-CM | POA: Insufficient documentation

## 2018-10-23 DIAGNOSIS — E890 Postprocedural hypothyroidism: Secondary | ICD-10-CM | POA: Diagnosis not present

## 2018-10-23 DIAGNOSIS — K5900 Constipation, unspecified: Secondary | ICD-10-CM | POA: Diagnosis present

## 2018-10-23 DIAGNOSIS — E876 Hypokalemia: Secondary | ICD-10-CM | POA: Diagnosis not present

## 2018-10-23 DIAGNOSIS — I10 Essential (primary) hypertension: Secondary | ICD-10-CM | POA: Diagnosis not present

## 2018-10-23 DIAGNOSIS — K449 Diaphragmatic hernia without obstruction or gangrene: Secondary | ICD-10-CM | POA: Diagnosis not present

## 2018-10-23 DIAGNOSIS — Z87891 Personal history of nicotine dependence: Secondary | ICD-10-CM | POA: Diagnosis not present

## 2018-10-23 DIAGNOSIS — Z66 Do not resuscitate: Secondary | ICD-10-CM | POA: Diagnosis not present

## 2018-10-23 DIAGNOSIS — J9610 Chronic respiratory failure, unspecified whether with hypoxia or hypercapnia: Secondary | ICD-10-CM | POA: Diagnosis not present

## 2018-10-23 DIAGNOSIS — K219 Gastro-esophageal reflux disease without esophagitis: Secondary | ICD-10-CM | POA: Diagnosis not present

## 2018-10-23 DIAGNOSIS — J449 Chronic obstructive pulmonary disease, unspecified: Secondary | ICD-10-CM | POA: Diagnosis not present

## 2018-10-23 DIAGNOSIS — D62 Acute posthemorrhagic anemia: Secondary | ICD-10-CM | POA: Diagnosis not present

## 2018-10-23 DIAGNOSIS — K625 Hemorrhage of anus and rectum: Secondary | ICD-10-CM | POA: Diagnosis not present

## 2018-10-23 DIAGNOSIS — K900 Celiac disease: Secondary | ICD-10-CM | POA: Diagnosis not present

## 2018-10-23 DIAGNOSIS — K922 Gastrointestinal hemorrhage, unspecified: Secondary | ICD-10-CM | POA: Diagnosis not present

## 2018-10-23 DIAGNOSIS — R000 Tachycardia, unspecified: Secondary | ICD-10-CM | POA: Diagnosis not present

## 2018-10-23 HISTORY — DX: Hemorrhage of anus and rectum: K62.5

## 2018-10-23 HISTORY — DX: Malignant neoplasm of thyroid gland: C73

## 2018-10-23 LAB — CBC WITH DIFFERENTIAL/PLATELET
Abs Immature Granulocytes: 0.03 10*3/uL (ref 0.00–0.07)
BASOS ABS: 0.1 10*3/uL (ref 0.0–0.1)
BASOS PCT: 1 %
EOS ABS: 0.5 10*3/uL (ref 0.0–0.5)
EOS PCT: 4 %
HCT: 46.7 % — ABNORMAL HIGH (ref 36.0–46.0)
Hemoglobin: 14.8 g/dL (ref 12.0–15.0)
Immature Granulocytes: 0 %
Lymphocytes Relative: 30 %
Lymphs Abs: 3.2 10*3/uL (ref 0.7–4.0)
MCH: 28.2 pg (ref 26.0–34.0)
MCHC: 31.7 g/dL (ref 30.0–36.0)
MCV: 89 fL (ref 80.0–100.0)
MONOS PCT: 8 %
Monocytes Absolute: 0.8 10*3/uL (ref 0.1–1.0)
NEUTROS PCT: 57 %
NRBC: 0 % (ref 0.0–0.2)
Neutro Abs: 5.9 10*3/uL (ref 1.7–7.7)
PLATELETS: 275 10*3/uL (ref 150–400)
RBC: 5.25 MIL/uL — ABNORMAL HIGH (ref 3.87–5.11)
RDW: 14 % (ref 11.5–15.5)
WBC: 10.5 10*3/uL (ref 4.0–10.5)

## 2018-10-23 LAB — TSH: TSH: 0.122 u[IU]/mL — ABNORMAL LOW (ref 0.350–4.500)

## 2018-10-23 LAB — TYPE AND SCREEN
ABO/RH(D): A POS
ANTIBODY SCREEN: NEGATIVE

## 2018-10-23 LAB — COMPREHENSIVE METABOLIC PANEL
ALT: 22 U/L (ref 0–44)
ANION GAP: 14 (ref 5–15)
AST: 34 U/L (ref 15–41)
Albumin: 4.1 g/dL (ref 3.5–5.0)
Alkaline Phosphatase: 55 U/L (ref 38–126)
BUN: 9 mg/dL (ref 8–23)
CHLORIDE: 99 mmol/L (ref 98–111)
CO2: 25 mmol/L (ref 22–32)
Calcium: 9.3 mg/dL (ref 8.9–10.3)
Creatinine, Ser: 0.83 mg/dL (ref 0.44–1.00)
GFR calc Af Amer: >60 mL/min (ref >60)
Glucose, Bld: 109 mg/dL — ABNORMAL HIGH (ref 70–99)
POTASSIUM: 3.8 mmol/L (ref 3.5–5.1)
Sodium: 138 mmol/L (ref 135–145)
TOTAL PROTEIN: 7.6 g/dL (ref 6.5–8.1)
Total Bilirubin: 1.4 mg/dL — ABNORMAL HIGH (ref 0.3–1.2)

## 2018-10-23 LAB — CBC
HCT: 40.9 % (ref 36.0–46.0)
Hemoglobin: 12.6 g/dL (ref 12.0–15.0)
MCH: 27.3 pg (ref 26.0–34.0)
MCHC: 30.8 g/dL (ref 30.0–36.0)
MCV: 88.7 fL (ref 80.0–100.0)
Platelets: 271 10*3/uL (ref 150–400)
RBC: 4.61 MIL/uL (ref 3.87–5.11)
RDW: 13.9 % (ref 11.5–15.5)
WBC: 9.2 10*3/uL (ref 4.0–10.5)
nRBC: 0 % (ref 0.0–0.2)

## 2018-10-23 LAB — T4, FREE: Free T4: 1.32 ng/dL (ref 0.82–1.77)

## 2018-10-23 MED ORDER — ONDANSETRON HCL 4 MG PO TABS: 4.0000 mg | ORAL_TABLET | Freq: Four times a day (QID) | ORAL | Status: DC | PRN

## 2018-10-23 MED ORDER — LEVOTHYROXINE SODIUM 25 MCG PO TABS
125.0000 ug | ORAL_TABLET | Freq: Every day | ORAL | Status: DC
  Administered 2018-10-24: 125 ug via ORAL
  Filled 2018-10-23: qty 1

## 2018-10-23 MED ORDER — LACTATED RINGERS IV SOLN
INTRAVENOUS | Status: DC
  Administered 2018-10-23: 14:00:00 via INTRAVENOUS

## 2018-10-23 MED ORDER — ACETAMINOPHEN 500 MG PO TABS
500.0000 mg | ORAL_TABLET | Freq: Four times a day (QID) | ORAL | Status: DC | PRN
  Filled 2018-10-23: qty 1

## 2018-10-23 MED ORDER — HYDROCORTISONE ACETATE 25 MG RE SUPP
25.0000 mg | Freq: Two times a day (BID) | RECTAL | Status: DC
  Administered 2018-10-23 – 2018-10-24 (×3): 25 mg via RECTAL
  Filled 2018-10-23 (×3): qty 1

## 2018-10-23 MED ORDER — ALBUTEROL SULFATE (2.5 MG/3ML) 0.083% IN NEBU: 3.0000 mL | INHALATION_SOLUTION | Freq: Four times a day (QID) | RESPIRATORY_TRACT | Status: DC | PRN

## 2018-10-23 MED ORDER — PANTOPRAZOLE SODIUM 40 MG PO TBEC
40.0000 mg | DELAYED_RELEASE_TABLET | Freq: Two times a day (BID) | ORAL | Status: DC
  Administered 2018-10-23 – 2018-10-24 (×3): 40 mg via ORAL
  Filled 2018-10-23 (×3): qty 1

## 2018-10-23 MED ORDER — ONDANSETRON HCL 4 MG/2ML IJ SOLN
4.0000 mg | Freq: Four times a day (QID) | INTRAMUSCULAR | Status: DC | PRN
Start: 2018-10-23 — End: 2018-10-24

## 2018-10-23 MED ORDER — DOCUSATE SODIUM 100 MG PO CAPS
100.0000 mg | ORAL_CAPSULE | Freq: Two times a day (BID) | ORAL | Status: DC
  Administered 2018-10-23 – 2018-10-24 (×3): 100 mg via ORAL
  Filled 2018-10-23 (×3): qty 1

## 2018-10-23 MED ORDER — AMLODIPINE BESYLATE 5 MG PO TABS
5.0000 mg | ORAL_TABLET | Freq: Every day | ORAL | Status: DC
  Administered 2018-10-23: 5 mg via ORAL
  Filled 2018-10-23: qty 1

## 2018-10-23 NOTE — ED Notes (Signed)
Admitting provider returned to bedside to complete evaluation

## 2018-10-23 NOTE — ED Notes (Signed)
Attempted to call report at this time.  No answer on [facility]

## 2018-10-23 NOTE — ED Notes (Signed)
Got patient undress on the monitor patient is resting with call bell in reach did ekg shown to Dr Rhina Brackett

## 2018-10-23 NOTE — ED Notes (Signed)
Admitting provider at bedside for evaluation.

## 2018-10-23 NOTE — ED Provider Notes (Signed)
[facility] EMERGENCY DEPARTMENT Provider Note   CSN: [id number] Arrival date & time: 10/23/18  0845     History   Chief Complaint Chief Complaint  Patient presents with  . Rectal Bleeding    HPI Angelica [name] is a 78 y.o. female.  78 year old female with prior medical history as detailed below presents for evaluation of rectal bleeding.  Patient reports that yesterday she was constipated and had to strain to have a bowel movement.  Patient reports that approximately 5 AM today she started having episodes of bright red blood per rectum.  She denies melena.  She denies nausea or hematemesis.  She denies prior episodes of GI bleed.  She denies current chest pain or shortness of breath.  The history is provided by the patient and medical records.  Rectal Bleeding  Quality:  Bright red Amount:  Moderate Duration:  4 hours Timing:  Constant Chronicity:  New Context: hemorrhoids   Similar prior episodes: no   Relieved by:  Nothing Worsened by:  Nothing Associated symptoms: no abdominal pain, no hematemesis and no light-headedness   Risk factors: no anticoagulant use     Past Medical History:  Diagnosis Date  . Anemia   . Arthritis   . Asthma   . Cancer ([place])    thyroid  . Celiac disease 11/17/2013  . COPD (chronic obstructive pulmonary disease) ([place])   . Hypertension   . Thyroid disease     Patient Active Problem List   Diagnosis Date Noted  . Bilateral primary osteoarthritis of knee 08/08/2016  . Reactive thrombocytosis 08/24/2014  . Celiac disease 11/17/2013  . Other nonspecific abnormal cardiovascular system function study 09/27/2012  . Pulmonary HTN ([place]) 09/27/2012  . Blood in stool 07/04/2012  . History of thyroid cancer- s/p total thyroidectomy 2012 07/04/2012  . Fatigue 07/04/2012  . Iron deficiency anemia 07/04/2012  . COPD (chronic obstructive pulmonary disease)- on nocturnal oxygen at home 07/04/2012  . GERD (gastroesophageal reflux  disease) 07/04/2012  . Chronic anemia- symptomatic 07/03/2012  . Hypokalemia 07/03/2012  . Essential hypertension 07/03/2012    Past Surgical History:  Procedure Laterality Date  . ABDOMINAL HYSTERECTOMY    . BREAST EXCISIONAL BIOPSY Bilateral   . CHOLECYSTECTOMY    . COLONOSCOPY  07/05/2012   Procedure: COLONOSCOPY;  Surgeon: Winfield Cunas., MD;  Location: [facility] ENDOSCOPY;  Service: Endoscopy;  Laterality: N/A;  . COLONOSCOPY N/A 03/12/2015   Procedure: COLONOSCOPY;  Surgeon: Laurence Spates, MD;  Location: [facility] ENDOSCOPY;  Service: Endoscopy;  Laterality: N/A;  . ESOPHAGOGASTRODUODENOSCOPY  07/05/2012   Procedure: ESOPHAGOGASTRODUODENOSCOPY (EGD);  Surgeon: Winfield Cunas., MD;  Location[facility] ENDOSCOPY;  Service: Endoscopy;  Laterality: N/A;  . HOT HEMOSTASIS N/A 03/12/2015   Procedure: HOT HEMOSTASIS (ARGON PLASMA COAGULATION/BICAP);  Surgeon: Laurence Spates, MD;  Location: [facility] ENDOSCOPY;  Service: Endoscopy;  Laterality: N/A;  . TONSILLECTOMY       OB History   No obstetric history on file.      Home Medications    Prior to Admission medications   Medication Sig Start Date End Date Taking? Authorizing Provider  albuterol (PROVENTIL HFA;VENTOLIN HFA) 108 (90 Base) MCG/ACT inhaler Inhale into the lungs every 6 (six) hours as needed for wheezing or shortness of breath.    [provider]  amLODipine (NORVASC) 5 MG tablet Take 5 mg by mouth 2 (two) times daily.     [provider]  fluticasone (FLONASE) 50 MCG/ACT nasal spray  01/08/18  [provider]  levothyroxine (SYNTHROID, LEVOTHROID) 125 MCG tablet Take 125 mcg by mouth daily.    [provider]  loratadine (CLARITIN) 10 MG tablet Take 10 mg by mouth daily.    [provider]  montelukast (SINGULAIR) 10 MG tablet  01/08/18   [provider]  pantoprazole sodium (PROTONIX) 40 mg/20 mL PACK Place 20 mLs (40 mg total) into feeding tube 2 (two) times daily. 07/06/12   Charlynne Cousins, MD  Vitamin D, Ergocalciferol, (DRISDOL) 50000 units CAPS capsule Take 50,000 Units by mouth every 7 (seven) days.    [provider]    Family History Family History  Problem Relation Age of Onset  . Heart disease Mother   . Kidney failure Father   . Cancer Sister        thyroid cancer    Social History Social History   Tobacco Use  . Smoking status: Former Smoker    Last attempt to quit: 07/03/2008    Years since quitting: 10.3  . Smokeless tobacco: Never Used  Substance Use Topics  . Alcohol use: No  . Drug use: No     Allergies   Other; Fosamax [alendronate sodium]; Penicillins; and Sulfa antibiotics   Review of Systems Review of Systems  Gastrointestinal: Positive for hematochezia. Negative for abdominal pain and hematemesis.  Neurological: Negative for light-headedness.  All other systems reviewed and are negative.    Physical Exam Updated Vital Signs BP (!) 154/93   Pulse 93   Temp 99 F (37.2 C) (Oral)   Resp 20   Wt 103.7 kg   SpO2 98%   BMI 40.50 kg/m   Physical Exam Vitals signs and nursing note reviewed.  Constitutional:      General: She is not in acute distress.    Appearance: She is well-developed.  HENT:     Head: Normocephalic and atraumatic.  Eyes:     Conjunctiva/sclera: Conjunctivae normal.     Pupils: Pupils are equal, round, and reactive to light.  Neck:     Musculoskeletal: Normal range of motion and neck supple.  Cardiovascular:     Rate and Rhythm: Normal rate and regular rhythm.     Heart sounds: Normal heart sounds.  Pulmonary:     Effort: Pulmonary effort is normal. No respiratory distress.     Breath sounds: Normal breath sounds.  Abdominal:     General: There is no distension.     Palpations: Abdomen is soft.     Tenderness: There is no abdominal tenderness.  Musculoskeletal: Normal range of motion.        General: No deformity.  Skin:    General: Skin is warm and dry.  Neurological:      Mental Status: She is alert and oriented to person, place, and time.      ED Treatments / Results  Labs (all labs ordered are listed, but only abnormal results are displayed) Labs Reviewed  CBC WITH DIFFERENTIAL/PLATELET - Abnormal; Notable for the following components:      Result Value   RBC 5.25 (*)    HCT 46.7 (*)    All other components within normal limits  COMPREHENSIVE METABOLIC PANEL - Abnormal; Notable for the following components:   Glucose, Bld 109 (*)    Total Bilirubin 1.4 (*)    All other components within normal limits  POC OCCULT BLOOD, ED  TYPE AND SCREEN    EKG EKG Interpretation  Date/Time:  Wednesday October 23 2018 08:55:54  EST Ventricular Rate:  103 PR Interval:    QRS Duration: 121 QT Interval:  375 QTC Calculation: 489 R Axis:   3 Text Interpretation:  Sinus or ectopic atrial tachycardia Right bundle branch block Anterior infarct, old Nonspecific T abnormalities, lateral leads Confirmed by Dene Gentry [phone]) on 10/23/2018 9:00:28 AM   Radiology No results found.  Procedures Procedures (including critical care time)  Medications Ordered in ED Medications - No data to display   Initial Impression / Assessment and Plan / ED Course  I have reviewed the triage vital signs and the nursing notes.  Pertinent labs & imaging results that were available during my care of the patient were reviewed by me and considered in my medical decision making (see chart for details).     MDM  Screen complete  Presenting for evaluation of reported rectal bleeding.  Patient with bloody stool present on exam.  Initial hemoglobin and vitals are reassuringly normal.   Patient is known to [facility] - Dr. Oletta Lamas.  He is aware of case and will evaluate.  Hospital service is aware of case.  Patient will be evaluated for observation admission.   Final Clinical Impressions(s) / ED Diagnoses   Final diagnoses:  Rectal bleeding    ED Discharge Orders     None       Valarie Merino, MD 10/23/18 1213

## 2018-10-23 NOTE — H&P (Signed)
History and Physical    QUINETTA SHILLING MBT:[id number] DOB: 10-Jan-1941 DOA: 10/23/2018  PCP: Velna Hatchet, MD Consultants:  Pollie Friar - orthopedics Patient coming from:  Home - lives alone; NOK: Sister, [phone]; Unk Pinto, [phone]  Chief Complaint: BRBPR  HPI: Angelica [name] is a 78 y.o. female with medical history significant of thyroid CA; HTN; COPD; and celiac disease presenting with BRBPR.  The patient was feeling constipation Sunday and Monday.  Her stomach was tight Monday AM.  Yesterday, she had a good BM and it was normal without blood.  She had a small BM last evening and she thought there might have been a small amount of bright red.  This AM, she got up with bright red with clots.  There was no stool this AM, just blood.  She had been noticing hemorrhoidal swelling and so she sat in hot water several times.  Her hemorrhoids were so big she sat in the hot water all day.  She wanted some relief this AM about 0500 and so she again sat in hot water.  When she got up she sat on the commode and started bleeding badly.  She had several episodes, maybe 5 total stools since 5AM.  She is continuing to have a little trickle with wiping now but no longer having frank bloody stools.  Mild abdominal nagging in the lower stomach and low back.  No n/v.  No aching.  No dizziness.  No excessive SOB with ambulation - but she wears 2L O2 prn at home.  She has required a blood transfusion in the past for iron deficiency anemia, last about 4 years ago.  Her last C-scope was in 2016.     ED Course:   Constipation yesterday with heavy straining. BRBPR x 4-5 stools from 5AM and ER.  She still feels like she is bleeding.  Heme +, grossly bloody stool.  BP 120s.  Hgb 14.8.  Normal C-scope several years ago.  No anti-platelets.  Dr. Oletta Lamas will agree to see.    Review of Systems: As per HPI; otherwise review of systems reviewed and negative.   Ambulatory Status:  Ambulates with a cane or  walker  Past Medical History:  Diagnosis Date  . Anemia   . Arthritis   . Asthma   . Celiac disease 11/17/2013  . COPD (chronic obstructive pulmonary disease) (HCC)    on 2L prn home O2  . Hypertension   . Rectal bleed 10/23/2018  . Thyroid cancer ([place])    remote    Past Surgical History:  Procedure Laterality Date  . ABDOMINAL HYSTERECTOMY    . BREAST EXCISIONAL BIOPSY Bilateral   . CHOLECYSTECTOMY    . COLONOSCOPY  07/05/2012   Procedure: COLONOSCOPY;  Surgeon: Winfield Cunas., MD;  Location[facility]COPY;  Service: Endoscopy;  Laterality: N/A;  . COLONOSCOPY N/A 03/12/2015   Procedure: COLONOSCOPY;  Surgeon: Laurence Spates, MD;  Location: [facility] ENDOSCOPY;  Service: Endoscopy;  Laterality: N/A;  . ESOPHAGOGASTRODUODENOSCOPY  07/05/2012   Procedure: ESOPHAGOGASTRODUODENOSCOPY (EGD);  Surgeon: Winfield Cunas., MD;  Location: [facility] ENDOSCOPY;  Service: Endoscopy;  Laterality: N/A;  . HOT HEMOSTASIS N/A 03/12/2015   Procedure: HOT HEMOSTASIS (ARGON PLASMA COAGULATION/BICAP);  Surgeon: Laurence Spates, MD;  Location: [facility] ENDOSCOPY;  Service: Endoscopy;  Laterality: N/A;  . TONSILLECTOMY      Social History   Socioeconomic History  . Marital status: Widowed    Spouse name: Not on file  . Number of  children: Not on file  . Years of education: Not on file  . Highest education level: Not on file  Occupational History  . Occupation: retired  Scientific laboratory technician  . Financial resource strain: Not on file  . Food insecurity:    Worry: Not on file    Inability: Not on file  . Transportation needs:    Medical: Not on file    Non-medical: Not on file  Tobacco Use  . Smoking status: Former Smoker    Packs/day: 0.75    Years: 60.00    Pack years: 45.00    Types: Cigarettes    Last attempt to quit: 07/03/2008    Years since quitting: 10.3  . Smokeless tobacco: Never Used  Substance and Sexual Activity  . Alcohol use: No  . Drug use: No  . Sexual activity: Not on file  Lifestyle  .  Physical activity:    Days per week: Not on file    Minutes per session: Not on file  . Stress: Not on file  Relationships  . Social connections:    Talks on phone: Not on file    Gets together: Not on file    Attends religious service: Not on file    Active member of club or organization: Not on file    Attends meetings of clubs or organizations: Not on file    Relationship status: Not on file  . Intimate partner violence:    Fear of current or ex partner: Not on file    Emotionally abused: Not on file    Physically abused: Not on file    Forced sexual activity: Not on file  Other Topics Concern  . Not on file  Social History Narrative  . Not on file    Allergies  Allergen Reactions  . Other Hives    Pt states she has several other allergies but can't recall the names, pt recalled the medication to be codeine.  . Fosamax [Alendronate Sodium]   . Penicillins Other (See Comments)    Unknown Did it involve swelling of the face/tongue/throat, SOB, or low BP? Unknown Did it involve sudden or severe rash/hives, skin peeling, or any reaction on the inside of your mouth or nose? Unknown Did you need to seek medical attention at a hospital or doctor's office? Unknown When did it last happen? If all above answers are "NO", may proceed with cephalosporin use.  . Sulfa Antibiotics     Family History  Problem Relation Age of Onset  . Heart disease Mother   . Kidney failure Father   . Cancer Sister        thyroid cancer    Prior to Admission medications   Medication Sig Start Date End Date Taking? Authorizing Provider  acetaminophen (TYLENOL) 500 MG tablet Take 500 mg by mouth every 6 (six) hours as needed (body ache).   Yes [provider]  albuterol (PROVENTIL HFA;VENTOLIN HFA) 108 (90 Base) MCG/ACT inhaler Inhale into the lungs every 6 (six) hours as needed for wheezing or shortness of breath.   Yes [provider]  amLODipine (NORVASC) 5 MG tablet Take  5 mg by mouth at bedtime.    Yes [provider]  fluticasone Asencion Islam) 50 MCG/ACT nasal spray  01/08/18  Yes [provider]  levothyroxine (SYNTHROID, LEVOTHROID) 125 MCG tablet Take 125 mcg by mouth daily.   Yes [provider]  loratadine (CLARITIN) 10 MG tablet Take 10 mg by mouth as needed for allergies or  rhinitis.    Yes [provider]  montelukast (SINGULAIR) 10 MG tablet Take 10 mg by mouth as needed (allergies).  01/08/18  Yes [provider]  OXYGEN Inhale 2 L into the lungs as needed (LOW OXYGEN).   Yes [provider]  pantoprazole sodium (PROTONIX) 40 mg/20 mL PACK Place 20 mLs (40 mg total) into feeding tube 2 (two) times daily. 07/06/12  Yes Charlynne Cousins, MD  Vitamin D, Ergocalciferol, (DRISDOL) 50000 units CAPS capsule Take 50,000 Units by mouth every 7 (seven) days.   Yes [provider]    Physical Exam: Vitals:   10/23/18 1030 10/23/18 1045 10/23/18 1100 10/23/18 1728  BP: 126/80 119/78 120/71 114/70  Pulse: 71 76 73 91  Resp: 14 (!) 21 18 18   Temp:    98.1 F (36.7 C)  TempSrc:    Oral  SpO2: 96% 96% 93% 95%  Weight:         . General:  Appears calm and comfortable and is NAD; she is conversant . Eyes:   EOMI, normal lids, iris . ENT:  grossly normal hearing, lips & tongue, mmm . Neck:  no LAD, masses or thyromegaly . Cardiovascular:  RRR, no m/r/g. No LE edema.  Marland Kitchen Respiratory:   CTA bilaterally with no wheezes/rales/rhonchi.  Normal respiratory effort. . Abdomen:  soft, mildly diffusely TTP, ND, NABS . Skin:  no rash or induration seen on limited exam . Musculoskeletal:  grossly normal tone BUE/BLE, good ROM, no bony abnormality . Psychiatric:  grossly normal mood and affect, speech fluent and appropriate, AOx3 . Neurologic:  CN 2-12 grossly intact, moves all extremities in coordinated fashion, sensation intact    Radiological Exams on Admission: No results found.  EKG: Independently  reviewed.  Sinus tachycardia with rate 103; nonspecific ST changes with no evidence of acute ischemia   Labs on Admission: I have personally reviewed the available labs and imaging studies at the time of the admission.  Pertinent labs:   Glucose 109 Bili 1.4 WBC 10.5 Hgb 14.8 - stable at 0920; 12.6 on repeat at 1705 A+, Ab neg  Assessment/Plan Principal Problem:   Rectal bleeding Active Problems:   Essential hypertension   History of thyroid cancer- s/p total thyroidectomy 2012   COPD (chronic obstructive pulmonary disease)- on nocturnal oxygen at home   Rectal bleeding -Patient with acute onset of rectal bleeding after 2 days of constipation and noted hemorrhoidal irritation -May be diverticular or hemorrhoidal -Either way, the acute bleeding appears to have waned and she is leaking small amounts of blood at this time -GI consulted and has seen the patient  -Per GI, this is most likely diverticular and the patient will likely need supportive treatment.  -She is afebrile at this time without tachycardia, and no leukocytosis; will not give antibiotics at this time.  -Overnight Observation  -CBC q12h; transfuse for Hgb <7 -Continue to monitor for recurrent bleeding  -Clear liquids; if bleeding resolves likely home tomorrow -Will order [facility] for hemorrhoids and patient will need a good bowel regimen to prevent chronic constipation  Chronic respiratory failure from COPD, on home O2 -O2 prn -Albuterol prn  Postsurgical hypothyroidism -Check TSH and free T4 -Continue Synthroid at current dose for now  HTN -Continue Norvasc    DVT prophylaxis: SCDs Code Status:  DNR - confirmed with patient/family Family Communication: Friend present throughout evaluation  Disposition Plan:  Home once clinically improved Consults called: GI  Admission status: It is my clinical opinion  that referral for OBSERVATION is reasonable and necessary in this patient based on the above  information provided. The aforementioned taken together are felt to place the patient at high risk for further clinical deterioration. However it is anticipated that the patient may be medically stable for discharge from the hospital within 24 to 48 hours.     Karmen Bongo MD [facility]   How to contact the [facility] Attending or Consulting provider [place] or covering provider during after hours [place], for this patient?  1. Check the care team in [facility] and look for a) attending/consulting [facility] provider listed and b) the [facility] team listed 2. Log into [email or web address] and use [employer]'s universal password to access. If you do not have the password, please contact the hospital operator. 3. Locate the [place] provider you are looking for under [facility] and page to a number that you can be directly reached. 4. If you still have difficulty reaching the provider, please page the [facility] (Director on Call) for the Hospitalists listed on amion for assistance.   10/23/2018, 5:40 PM

## 2018-10-23 NOTE — ED Notes (Signed)
Attempted to call report, second time, No answer on [facility]

## 2018-10-23 NOTE — Consult Note (Signed)
[facility] Reason for consult: Hematochezia Referring Physician: [facility].  PCP Dr. Velna Hatchet.  Primary GI: Dr. Laurence Spates  Angelica [name] is an 78 y.o. female.  HPI: She has had a long history of iron deficiency and positive stools.  I have seen her for evaluation for this over the past several years.  She had EGD around 2014 showing a very large hiatal hernia measuring approximately 9 cm in length.  Colonoscopy 6/16 done as a repeat for positive stool was negative other than diverticulosis.  Her Hemoccults were routinely positive and she was followed for some time by hematology with as needed iron infusions.  She has COPD and at 1 point was on home O2.  Her GI work-up has included several colonoscopies and endoscopies as well as negative small bowel capsule.  She has been followed by endocrine due to postsurgical hypothyroidism.  She carried diagnoses of prior thyroid cancer, celiac disease, diabetes and osteoporosis. She had been doing reasonably well lately.  She does try to follow a celiac diet.  She recently has had hematochezia.  She states that she normally has soft stools are sometimes diarrhea but for a couple of days was constipated.  She had bowel movement yesterday and then today passed at least 2-3 bloody bowel movements.  She is not really having any abdominal pain.  She does not have much in the way of reflux and has not been on any NSAIDs or anticoagulants.  She takes Protonix routinely for her large hiatal hernia which tends to control the symptoms and has been on this for a number of years.  Past Medical History:  Diagnosis Date  . Anemia   . Arthritis   . Asthma   . Cancer ([place])    thyroid  . Celiac disease 11/17/2013  . COPD (chronic obstructive pulmonary disease) ([place])   . Hypertension   . Thyroid disease     Past Surgical History:  Procedure Laterality Date  . ABDOMINAL HYSTERECTOMY    . BREAST EXCISIONAL BIOPSY Bilateral   .  CHOLECYSTECTOMY    . COLONOSCOPY  07/05/2012   Procedure: COLONOSCOPY;  Surgeon: Winfield Cunas., MD;  Location: [facility] ENDOSCOPY;  Service: Endoscopy;  Laterality: N/A;  . COLONOSCOPY N/A 03/12/2015   Procedure: COLONOSCOPY;  Surgeon: Laurence Spates, MD;  Location: [facility] ENDOSCOPY;  Service: Endoscopy;  Laterality: N/A;  . ESOPHAGOGASTRODUODENOSCOPY  07/05/2012   Procedure: ESOPHAGOGASTRODUODENOSCOPY (EGD);  Surgeon: Winfield Cunas., MD;  Location: [facility] ENDOSCOPY;  Service: Endoscopy;  Laterality: N/A;  . HOT HEMOSTASIS N/A 03/12/2015   Procedure: HOT HEMOSTASIS (ARGON PLASMA COAGULATION/BICAP);  Surgeon: Laurence Spates, MD;  Location: [facility] ENDOSCOPY;  Service: Endoscopy;  Laterality: N/A;  . TONSILLECTOMY      Family History  Problem Relation Age of Onset  . Heart disease Mother   . Kidney failure Father   . Cancer Sister        thyroid cancer    Social History:  reports that she quit smoking about 10 years ago. She has never used smokeless tobacco. She reports that she does not drink alcohol or use drugs.  Allergies:  Allergies  Allergen Reactions  . Other Hives    Pt states she has several other allergies but can't recall the names, pt recalled the medication to be codeine.  . Fosamax [Alendronate Sodium]   . Penicillins Other (See Comments)    Unknown Did it involve swelling of the face/tongue/throat, SOB, or low BP? Unknown Did it involve  sudden or severe rash/hives, skin peeling, or any reaction on the inside of your mouth or nose? Unknown Did you need to seek medical attention at a hospital or doctor's office? Unknown When did it last happen? If all above answers are "NO", may proceed with cephalosporin use.  . Sulfa Antibiotics     Medications; Prior to Admission medications   Medication Sig Start Date End Date Taking? Authorizing Provider  acetaminophen (TYLENOL) 500 MG tablet Take 500 mg by mouth every 6 (six) hours as needed (body ache).   Yes [provider]  albuterol (PROVENTIL HFA;VENTOLIN HFA) 108 (90 Base) MCG/ACT inhaler Inhale into the lungs every 6 (six) hours as needed for wheezing or shortness of breath.   Yes [provider]  amLODipine (NORVASC) 5 MG tablet Take 5 mg by mouth at bedtime.    Yes [provider]  fluticasone Asencion Islam) 50 MCG/ACT nasal spray  01/08/18  Yes [provider]  levothyroxine (SYNTHROID, LEVOTHROID) 125 MCG tablet Take 125 mcg by mouth daily.   Yes [provider]  loratadine (CLARITIN) 10 MG tablet Take 10 mg by mouth as needed for allergies or rhinitis.    Yes [provider]  montelukast (SINGULAIR) 10 MG tablet Take 10 mg by mouth as needed (allergies).  01/08/18  Yes [provider]  OXYGEN Inhale 2 L into the lungs as needed (LOW OXYGEN).   Yes [provider]  pantoprazole sodium (PROTONIX) 40 mg/20 mL PACK Place 20 mLs (40 mg total) into feeding tube 2 (two) times daily. 07/06/12  Yes Charlynne Cousins, MD  Vitamin D, Ergocalciferol, (DRISDOL) 50000 units CAPS capsule Take 50,000 Units by mouth every 7 (seven) days.   Yes [provider]    PRN Meds  Results for orders placed or performed during the hospital encounter of 10/23/18 (from the past 48 hour(s))  CBC with Differential     Status: Abnormal   Collection Time: 10/23/18  9:20 AM  Result Value Ref Range   WBC 10.5 4.0 - 10.5 K/uL   RBC 5.25 (H) 3.87 - 5.11 MIL/uL   Hemoglobin 14.8 12.0 - 15.0 g/dL   HCT 46.7 (H) 36.0 - 46.0 %   MCV 89.0 80.0 - 100.0 fL   MCH 28.2 26.0 - 34.0 pg   MCHC 31.7 30.0 - 36.0 g/dL   RDW 14.0 11.5 - 15.5 %   Platelets 275 150 - 400 K/uL   nRBC 0.0 0.0 - 0.2 %   Neutrophils Relative % 57 %   Neutro Abs 5.9 1.7 - 7.7 K/uL   Lymphocytes Relative 30 %   Lymphs Abs 3.2 0.7 - 4.0 K/uL   Monocytes Relative 8 %   Monocytes Absolute 0.8 0.1 - 1.0 K/uL   Eosinophils Relative 4 %   Eosinophils Absolute 0.5 0.0 - 0.5 K/uL   Basophils Relative 1 %    Basophils Absolute 0.1 0.0 - 0.1 K/uL   Immature Granulocytes 0 %   Abs Immature Granulocytes 0.03 0.00 - 0.07 K/uL    Comment: Performed at [facility], [address] [place],  [address]  Comprehensive metabolic panel     Status: Abnormal   Collection Time: 10/23/18  9:20 AM  Result Value Ref Range   Sodium 138 135 - 145 mmol/L   Potassium 3.8 3.5 - 5.1 mmol/L    Comment: SPECIMEN HEMOLYZED. HEMOLYSIS MAY AFFECT INTEGRITY OF RESULTS.   Chloride 99 98 - 111 mmol/L   CO2 25 22 -  32 mmol/L   Glucose, Bld 109 (H) 70 - 99 mg/dL   BUN 9 8 - 23 mg/dL   Creatinine, Ser 0.83 0.44 - 1.00 mg/dL   Calcium 9.3 8.9 - 10.3 mg/dL   Total Protein 7.6 6.5 - 8.1 g/dL   Albumin 4.1 3.5 - 5.0 g/dL   AST 34 15 - 41 U/L   ALT 22 0 - 44 U/L   Alkaline Phosphatase 55 38 - 126 U/L   Total Bilirubin 1.4 (H) 0.3 - 1.2 mg/dL   GFR calc non Af Amer >60 >60 mL/min   GFR calc Af Amer >60 >60 mL/min   Anion gap 14 5 - 15    Comment: Performed at [facility] [address], [place], [place] [address]  Type and screen [place]     Status: None   Collection Time: 10/23/18  9:20 AM  Result Value Ref Range   ABO/RH(D) A POS    Antibody Screen NEG    Sample Expiration      10/26/2018 Performed at [facility], [place] [address] [place], [place] [address]     No results found.            Blood pressure 120/71, pulse 73, temperature 99 F (37.2 C), temperature source Oral, resp. rate 18, weight 103.7 kg, SpO2 93 %.  Physical exam:   General--talkative obese white female in no distress  ENT--sclera nonicteric mucous membranes moist no coffee-ground material in the mouth Neck--very large neck appears to have full range of motion Heart--regular rate and rhythm without murmurs or gallops Lungs--clear Abdomen--soft and nontender nondistended, obese  psych--alert and oriented answers questions appropriately   Assessment: 1.  Hematochezia.  This is  very likely due to a diverticular bleed or hemorrhoidal bleed.  Patient has had some constipation which would fit that.  Fortunately her hemoglobin 14.8 and has not changed from values a year ago. 2.  Diverticulosis.  Likely the source of her recent bleeding 3.  Chronic iron deficiency.  Probably due in part to GI bleeding as well as to the very large hiatal hernia.  She has required iron infusions in the past 4.  GERD with large hiatal hernia on EGD in the past on chronic PPI therapy 5.  COPD.  Has been on oxygen therapy in the past. tends to use this only at night currently. 6.  History of thyroidectomy due to thyroid cancer  Plan: 1.  Would continue her on a clear liquid diet now. 2.  We will start her on MiraLAX twice daily and see if this clears up.  If this clears up promptly, she may not need another colonoscopy.  If she drops her hemoglobin significantly we should probably go ahead and consider doing another colonoscopy at some point.   Nancy Fetter 10/23/2018, 12:54 PM   This note was created using voice recognition software and minor errors may Have occurred unintentionally. Pager: [phone] If no answer or after hours call [phone]

## 2018-10-23 NOTE — ED Triage Notes (Signed)
Pt in from home with c/o rectal bleeding since 0500 this am, states 5 episodes bright red since then. Not on any blood thinners, states she has hemorrhoids and was constipated yesterday. Has had to have blood transfusions before. A&ox4, denies weakness or sob

## 2018-10-23 NOTE — ED Notes (Signed)
Gastroenterologist at bedside for evaluation.

## 2018-10-23 NOTE — ED Notes (Signed)
Pt to restroom with one assist, unsteady gait. Grabbing at walls for stabilization.  Small amount of bright red blood noted from rectum at this time.  Pt returned to bed with assistance.  Will continue to monitor closely.

## 2018-10-24 DIAGNOSIS — K922 Gastrointestinal hemorrhage, unspecified: Secondary | ICD-10-CM | POA: Diagnosis not present

## 2018-10-24 DIAGNOSIS — D62 Acute posthemorrhagic anemia: Secondary | ICD-10-CM | POA: Diagnosis not present

## 2018-10-24 DIAGNOSIS — J410 Simple chronic bronchitis: Secondary | ICD-10-CM

## 2018-10-24 DIAGNOSIS — K5790 Diverticulosis of intestine, part unspecified, without perforation or abscess without bleeding: Secondary | ICD-10-CM | POA: Diagnosis not present

## 2018-10-24 DIAGNOSIS — Z85850 Personal history of malignant neoplasm of thyroid: Secondary | ICD-10-CM

## 2018-10-24 DIAGNOSIS — I10 Essential (primary) hypertension: Secondary | ICD-10-CM | POA: Diagnosis not present

## 2018-10-24 DIAGNOSIS — K625 Hemorrhage of anus and rectum: Secondary | ICD-10-CM | POA: Diagnosis not present

## 2018-10-24 LAB — BASIC METABOLIC PANEL
Anion gap: 9 (ref 5–15)
BUN: 6 mg/dL — ABNORMAL LOW (ref 8–23)
CALCIUM: 8.2 mg/dL — AB (ref 8.9–10.3)
CO2: 29 mmol/L (ref 22–32)
Chloride: 105 mmol/L (ref 98–111)
Creatinine, Ser: 0.85 mg/dL (ref 0.44–1.00)
GFR calc Af Amer: >60 mL/min (ref >60)
GFR calc non Af Amer: >60 mL/min (ref >60)
Glucose, Bld: 116 mg/dL — ABNORMAL HIGH (ref 70–99)
Potassium: 3.1 mmol/L — ABNORMAL LOW (ref 3.5–5.1)
Sodium: 143 mmol/L (ref 135–145)

## 2018-10-24 LAB — CBC
HCT: 38.1 % (ref 36.0–46.0)
Hemoglobin: 12.2 g/dL (ref 12.0–15.0)
MCH: 28.4 pg (ref 26.0–34.0)
MCHC: 32 g/dL (ref 30.0–36.0)
MCV: 88.8 fL (ref 80.0–100.0)
Platelets: 242 10*3/uL (ref 150–400)
RBC: 4.29 MIL/uL (ref 3.87–5.11)
RDW: 14 % (ref 11.5–15.5)
WBC: 8.4 10*3/uL (ref 4.0–10.5)
nRBC: 0 % (ref 0.0–0.2)

## 2018-10-24 MED ORDER — HYDROCORTISONE ACETATE 25 MG RE SUPP: 25.0000 mg | Freq: Two times a day (BID) | RECTAL | 0 refills | Status: DC | PRN

## 2018-10-24 MED ORDER — POLYETHYLENE GLYCOL 3350 17 G PO PACK: 17.0000 g | PACK | Freq: Two times a day (BID) | ORAL | 0 refills | Status: DC

## 2018-10-24 MED ORDER — PANTOPRAZOLE SODIUM 40 MG PO TBEC: 40.0000 mg | DELAYED_RELEASE_TABLET | Freq: Every day | ORAL | 0 refills | Status: DC

## 2018-10-24 NOTE — Progress Notes (Signed)
GI Discharge/Sign-Off Note  Subjective: Patient reports no significant bleeding.  Hemoglobin stable.  Suspect all of this was result of bleeding from hemorrhoids or diverticulosis which she is known to have.  Should be fine for discharge  Principal Problem:   Rectal bleeding Active Problems:   Essential hypertension   History of thyroid cancer- s/p total thyroidectomy 2012   COPD (chronic obstructive pulmonary disease)- on nocturnal oxygen at home   Results for orders placed or performed during the hospital encounter of 10/23/18 (from the past 72 hour(s))  CBC with Differential     Status: Abnormal   Collection Time: 10/23/18  9:20 AM  Result Value Ref Range   WBC 10.5 4.0 - 10.5 K/uL   RBC 5.25 (H) 3.87 - 5.11 MIL/uL   Hemoglobin 14.8 12.0 - 15.0 g/dL   HCT 46.7 (H) 36.0 - 46.0 %   MCV 89.0 80.0 - 100.0 fL   MCH 28.2 26.0 - 34.0 pg   MCHC 31.7 30.0 - 36.0 g/dL   RDW 14.0 11.5 - 15.5 %   Platelets 275 150 - 400 K/uL   nRBC 0.0 0.0 - 0.2 %   Neutrophils Relative % 57 %   Neutro Abs 5.9 1.7 - 7.7 K/uL   Lymphocytes Relative 30 %   Lymphs Abs 3.2 0.7 - 4.0 K/uL   Monocytes Relative 8 %   Monocytes Absolute 0.8 0.1 - 1.0 K/uL   Eosinophils Relative 4 %   Eosinophils Absolute 0.5 0.0 - 0.5 K/uL   Basophils Relative 1 %   Basophils Absolute 0.1 0.0 - 0.1 K/uL   Immature Granulocytes 0 %   Abs Immature Granulocytes 0.03 0.00 - 0.07 K/uL    Comment: Performed at [facility], [address] [place], [place] [address]  Comprehensive metabolic panel     Status: Abnormal   Collection Time: 10/23/18  9:20 AM  Result Value Ref Range   Sodium 138 135 - 145 mmol/L   Potassium 3.8 3.5 - 5.1 mmol/L    Comment: SPECIMEN HEMOLYZED. HEMOLYSIS MAY AFFECT INTEGRITY OF RESULTS.   Chloride 99 98 - 111 mmol/L   CO2 25 22 - 32 mmol/L   Glucose, Bld 109 (H) 70 - 99 mg/dL   BUN 9 8 - 23 mg/dL   Creatinine, Ser 0.83 0.44 - 1.00 mg/dL   Calcium 9.3 8.9 - 10.3 mg/dL   Total Protein 7.6 6.5  - 8.1 g/dL   Albumin 4.1 3.5 - 5.0 g/dL   AST 34 15 - 41 U/L   ALT 22 0 - 44 U/L   Alkaline Phosphatase 55 38 - 126 U/L   Total Bilirubin 1.4 (H) 0.3 - 1.2 mg/dL   GFR calc non Af Amer >60 >60 mL/min   GFR calc Af Amer >60 >60 mL/min   Anion gap 14 5 - 15    Comment: Performed at [facility] [address], [place], [place] [address]  Type and screen [place]     Status: None   Collection Time: 10/23/18  9:20 AM  Result Value Ref Range   ABO/RH(D) A POS    Antibody Screen NEG    Sample Expiration      10/26/2018 Performed at [facility], [place] [address], [place], [place] [address]   TSH     Status: Abnormal   Collection Time: 10/23/18  1:56 PM  Result Value Ref Range   TSH 0.122 (L) 0.350 - 4.500 uIU/mL  Comment: Performed by a 3rd Generation assay with a functional sensitivity of <=0.01 uIU/mL. Performed at [facility], [place] [address] [place] [address]   CBC     Status: None   Collection Time: 10/23/18  5:05 PM  Result Value Ref Range   WBC 9.2 4.0 - 10.5 K/uL   RBC 4.61 3.87 - 5.11 MIL/uL   Hemoglobin 12.6 12.0 - 15.0 g/dL   HCT 40.9 36.0 - 46.0 %   MCV 88.7 80.0 - 100.0 fL   MCH 27.3 26.0 - 34.0 pg   MCHC 30.8 30.0 - 36.0 g/dL   RDW 13.9 11.5 - 15.5 %   Platelets 271 150 - 400 K/uL   nRBC 0.0 0.0 - 0.2 %    Comment: Performed at [facility], [place] [address], [place], [place] [address]  T4, free     Status: None   Collection Time: 10/23/18  7:09 PM  Result Value Ref Range   Free T4 1.32 0.82 - 1.77 ng/dL    Comment: (NOTE) Biotin ingestion may interfere with free T4 tests. If the results are inconsistent with the TSH level, previous test results, or the clinical presentation, then consider biotin interference. If needed, order repeat testing after stopping biotin. Performed at [facility], [place] [address], [place], [place] [address]   Basic metabolic panel     Status: Abnormal   Collection Time:  10/24/18  5:49 AM  Result Value Ref Range   Sodium 143 135 - 145 mmol/L   Potassium 3.1 (L) 3.5 - 5.1 mmol/L   Chloride 105 98 - 111 mmol/L   CO2 29 22 - 32 mmol/L   Glucose, Bld 116 (H) 70 - 99 mg/dL   BUN 6 (L) 8 - 23 mg/dL   Creatinine, Ser 0.85 0.44 - 1.00 mg/dL   Calcium 8.2 (L) 8.9 - 10.3 mg/dL   GFR calc non Af Amer >60 >60 mL/min   GFR calc Af Amer >60 >60 mL/min   Anion gap 9 5 - 15    Comment: Performed at [facility], [place] [address] [place], [place] [address]  CBC     Status: None   Collection Time: 10/24/18  5:49 AM  Result Value Ref Range   WBC 8.4 4.0 - 10.5 K/uL   RBC 4.29 3.87 - 5.11 MIL/uL   Hemoglobin 12.2 12.0 - 15.0 g/dL   HCT 38.1 36.0 - 46.0 %   MCV 88.8 80.0 - 100.0 fL   MCH 28.4 26.0 - 34.0 pg   MCHC 32.0 30.0 - 36.0 g/dL   RDW 14.0 11.5 - 15.5 %   Platelets 242 150 - 400 K/uL   nRBC 0.0 0.0 - 0.2 %    Comment: Performed at [facility], [place] [address] [place], [place] [address]    No results found.  @[email or web address] @  GI DISCHARGE PLANNING:  Diet: High-fiber  Anticoagulation/Antiplatelet Rx Suggestions: Avoid NSAIDs for a week  GI Medications: Suggest fiber supplement such as FiberCon or Metamucil capsules  Labs/Procedures Ordered:  GI FOLLOW UP:  Call [phone] to make appointment.   Doctor: Laurence Spates, MD  Time: 2 to 3 weeks   Laurence Spates, MD   Pager [phone] If no answer or after hours call [phone]

## 2018-10-24 NOTE — Progress Notes (Signed)
Patient discharge teaching complete. Meds, diet, activity and follow up appointments reviewed and all questions answered. Copy of instructions given to patient and prescriptions sent to pharmacy. Patient discharged home via wheelchair with friend.

## 2018-10-24 NOTE — Discharge Summary (Signed)
GI bleeding: Acute.  Hemoglobin on admission 14.8 -> 12.6 -> 12.2 g/dL.  Dr. Laurence Spates of gastroenterology was consulted and suspected to be a diverticular.  Patient had initially been placed on clear liquids.  Advancing diet to heart healthy.  Plan for discharge home  Chronic respiratory failure from COPD  Hypothyroidism: Stable.  On admission TSH noted to be point 0.122 with free T4 1.32.  Patient was continued on levothyroxine                                                                                                                                                                    Angelica [name], is a 78 y.o. female  DOB [birth year]  MRN [id number].  Admission date:  10/23/2018  Admitting Physician  Karmen Bongo, MD  Discharge Date:  10/25/2018   Primary MD  Velna Hatchet, MD  Recommendations for primary care physician for things to follow:      Discharge Diagnosis  Rectal bleeding [K62.5]    Principal Problem:   Rectal bleeding Active Problems:   Essential hypertension   History of thyroid cancer- s/p total thyroidectomy 2012   COPD (chronic obstructive pulmonary disease)- on nocturnal oxygen at home      Past Medical History:  Diagnosis Date  . Anemia   . Arthritis   . Asthma   . Celiac disease 11/17/2013  . COPD (chronic obstructive pulmonary disease) (HCC)    on 2L prn home O2  . Hypertension   . Rectal bleed 10/23/2018  . Thyroid cancer ([place])    remote    Past Surgical History:  Procedure Laterality Date  . ABDOMINAL HYSTERECTOMY    . BREAST EXCISIONAL BIOPSY Bilateral   . CHOLECYSTECTOMY    . COLONOSCOPY  07/05/2012   Procedure: COLONOSCOPY;  Surgeon: Winfield Cunas., MD;  Location: [facility] ENDOSCOPY;  Service: Endoscopy;  Laterality: N/A;  . COLONOSCOPY N/A 03/12/2015   Procedure: COLONOSCOPY;  Surgeon: Laurence Spates, MD;  Location: [facility] ENDOSCOPY;  Service: Endoscopy;  Laterality: N/A;  . ESOPHAGOGASTRODUODENOSCOPY  07/05/2012   Procedure:  ESOPHAGOGASTRODUODENOSCOPY (EGD);  Surgeon: Winfield Cunas., MD;  Location: [facility] ENDOSCOPY;  Service: Endoscopy;  Laterality: N/A;  . HOT HEMOSTASIS N/A 03/12/2015   Procedure: HOT HEMOSTASIS (ARGON PLASMA COAGULATION/BICAP);  Surgeon: Laurence Spates, MD;  Location: [facility] ENDOSCOPY;  Service: Endoscopy;  Laterality: N/A;  . TONSILLECTOMY         HPI  from the history and physical done on the day of admission:  Angelica [name] is a 78 y.o. female with medical history significant of thyroid CA; HTN; COPD; and celiac disease presenting with BRBPR.  The patient was feeling constipation Sunday and Monday.  Her stomach was tight Monday AM.  Yesterday, she had  a good BM and it was normal without blood.  She had a small BM last evening and she thought there might have been a small amount of bright red.  This AM, she got up with bright red with clots.  There was no stool this AM, just blood.  She had been noticing hemorrhoidal swelling and so she sat in hot water several times.  Her hemorrhoids were so big she sat in the hot water all day.  She wanted some relief this AM about 0500 and so she again sat in hot water.  When she got up she sat on the commode and started bleeding badly.  She had several episodes, maybe 5 total stools since 5AM.  She is continuing to have a little trickle with wiping now but no longer having frank bloody stools.  Mild abdominal nagging in the lower stomach and low back.  No n/v.  No aching.  No dizziness.  No excessive SOB with ambulation - but she wears 2L O2 prn at home.  She has required a blood transfusion in the past for iron deficiency anemia, last about 4 years ago.  Her last C-scope was in 2016.     ED Course:   Constipation yesterday with heavy straining. BRBPR x 4-5 stools from 5AM and ER.  She still feels like she is bleeding.  Heme +, grossly bloody stool.  BP 120s.  Hgb 14.8.  Normal C-scope several years ago.  No anti-platelets.  Dr. Oletta Lamas will agree to see.       Hospital Course:   1.  GI bleeding, history of diverticulosis: Resolving. Patient was admitted for observation overnight as bleeding symptoms appeared to be decreasing. Hemoglobin on admission 14.8 -> 12.6 -> 12.2 g/dL prior to discharge.   Patient was typed and screened question the possibility of need of blood products, but was never transfused.  Symptoms thought to be diverticular versus hemorrhoidal initially.  Patient was ordered Anusol HC. Dr. Laurence Spates of gastroenterology was consulted, and suspected symptoms be related a diverticular bleed and no colonoscopy was recommended as blood counts stayed stable.  Her last colonoscopy was on 6/16 noted to have moderate diverticulosis.  Diet was advanced from clear liquids to heart healthy without issues.   Gastroenterology recommended sending patient home on a bowel regimen with MiraLAX to see if it would help improve constipation.    2.  Chronic respiratory failure with history of COPD on oxygen: Patient reports being on 1-2 L of oxygen as needed.  No respiratory issues noted during her hospitalization.  Continue albuterol nebs as needed.  3.  Postsurgical hypothyroidism: Free T4 noted to be 1.32 and TSH 0.122.  She is continued on her current dose of levothyroxine, and patient was advised to follow-up with primary care provider  4.  Hypokalemia: Acute.  Prior to discharge patient's potassium noted to be 3.1.  She was given replacement prior to discharge.  Will need to follow-up with primary care provider.  5.  GERD with hiatal hernia: Patient was continued on Protonix.  Follow UP  Follow-up Information    Laurence Spates, MD. Schedule an appointment as soon as possible for a visit in 2 week(s).   Specialty:  Gastroenterology Contact information: [address] [place] [address] [phone]        Velna Hatchet, MD. Schedule an appointment as soon as possible for a visit in 1 week(s).   Specialty:  Internal  Medicine Contact information: [place]  [place] [address] [phone]            Consults obtained: Dr. Oletta Lamas of gastroenterology  Discharge Condition: Stable  Diet and Activity recommendation: See Discharge Instructions below  Discharge Instructions     Discharge Instructions    Discharge instructions   Complete by:  As directed    Follow with Primary MD Velna Hatchet, MD within 7 days.  Dr. Laurence Spates of gastroenterology wanted you to take MiraLAX twice a day to see if it helps resolve symptoms.  You also need to set up a follow-up appointment with him within 2 weeks  Get a repeat CBC to make sure that her blood counts are stable since being discharged from the hospital-  checked  by Primary MD or SNF MD within 7 days ( we routinely change or add medications that can affect your baseline labs and fluid status, therefore we recommend that you get the mentioned basic workup next visit with your PCP, your PCP may decide not to get them or add new tests based on their clinical decision)  Activity: As tolerated with fall precautions use walker/cane for assistance as needed  Disposition Home   Diet: Heart Healthy  Special Instructions: If you have smoked or chewed Tobacco  in the last 2 yrs please stop smoking, stop any regular Alcohol  and or any Recreational drug use.  On your next visit with your primary care physician please Get Medicines reviewed and adjusted.  Please request your Velna Hatchet, MD to go over all Hospital Tests and Procedure/Radiological results at the follow up, please get all Hospital records sent to your Prim MD by signing hospital release before you go home.  If you experience worsening of your admission symptoms, develop shortness of breath, life threatening emergency, suicidal or homicidal thoughts you must seek medical attention immediately by calling 911 or calling your MD immediately  if symptoms less severe.  You Must read  complete instructions/literature along with all the possible adverse reactions/side effects for all the Medicines you take and that have been prescribed to you. Take any new Medicines after you have completely understood and accpet all the possible adverse reactions/side effects.   Do not drive, operate heavy machinery, perform activities at heights, swimming or participation in water activities or provide baby sitting services if your were admitted for syncope or siezures until you have seen by Primary MD or a Neurologist and advised to do so again.  Do not drive when taking Pain medications.  Do not take more than prescribed Pain, Sleep and Anxiety Medications  Wear Seat belts while driving.   Please note  You were cared for by a hospitalist during your hospital stay. If you have any questions about your discharge medications or the care you received while you were in the hospital after you are discharged, you can call the unit and asked to speak with the hospitalist on call if the hospitalist that took care of you is not available. Once you are discharged, your primary care physician will handle any further medical issues. Please note that NO REFILLS for any discharge medications will be authorized once you are discharged, as it is imperative that you return to your primary care physician (or establish a relationship with a primary care physician if you do not have one) for your aftercare needs so that they can reassess your need for medications and monitor your lab values.   Increase activity slowly   Complete by:  As directed  Discharge Medications     Allergies as of 10/24/2018      Reactions   Other Hives   Pt states she has several other allergies but can't recall the names, pt recalled the medication to be codeine.   Fosamax [alendronate Sodium]    Penicillins Other (See Comments)   Unknown Did it involve swelling of the face/tongue/throat, SOB, or low BP? Unknown Did it  involve sudden or severe rash/hives, skin peeling, or any reaction on the inside of your mouth or nose? Unknown Did you need to seek medical attention at a hospital or doctor's office? Unknown When did it last happen? If all above answers are "NO", may proceed with cephalosporin use.   Sulfa Antibiotics       Medication List    STOP taking these medications   Vitamin D (Ergocalciferol) 1.25 MG (50000 UT) Caps capsule Commonly known as:  DRISDOL     TAKE these medications   acetaminophen 500 MG tablet Commonly known as:  TYLENOL Take 500 mg by mouth every 6 (six) hours as needed (body ache).   albuterol 108 (90 Base) MCG/ACT inhaler Commonly known as:  PROVENTIL HFA;VENTOLIN HFA Inhale into the lungs every 6 (six) hours as needed for wheezing or shortness of breath.   amLODipine 5 MG tablet Commonly known as:  NORVASC Take 5 mg by mouth at bedtime.   fluticasone 50 MCG/ACT nasal spray Commonly known as:  FLONASE   hydrocortisone 25 MG suppository Commonly known as:  ANUSOL-HC Place 1 suppository (25 mg total) rectally 2 (two) times daily as needed for hemorrhoids or anal itching.   levothyroxine 125 MCG tablet Commonly known as:  SYNTHROID, LEVOTHROID Take 125 mcg by mouth daily.   loratadine 10 MG tablet Commonly known as:  CLARITIN Take 10 mg by mouth as needed for allergies or rhinitis.   montelukast 10 MG tablet Commonly known as:  SINGULAIR Take 10 mg by mouth as needed (allergies).   OXYGEN Inhale 2 L into the lungs as needed (LOW OXYGEN).   pantoprazole 40 MG tablet Commonly known as:  PROTONIX Take 1 tablet (40 mg total) by mouth daily.   polyethylene glycol packet Commonly known as:  MIRALAX Take 17 g by mouth 2 (two) times daily.       Major procedures and Radiology Reports - PLEASE review detailed and final reports for all details, in brief -     No results found.  Micro Results   No results found for this or any previous visit (from  the past 240 hour(s)).   Today   Subjective    Angelica [name] feels good, and is ready to go home today.   Objective   Blood pressure 136/71, pulse 67, temperature 98.3 F (36.8 C), temperature source Oral, resp. rate 18, height 5' 3"  (1.6 m), weight 100.9 kg, SpO2 96 %.    Exam  Constitutional: Elderly female in NAD, calm, comfortable Eyes: PERRL, lids and conjunctivae normal ENMT: Mucous membranes are moist. Posterior pharynx clear of any exudate or lesions.  Neck: normal, supple, no masses, no thyromegaly Respiratory: Decreased overall aeration with prolonged exhalation.  No significant rhonchi or rails noted.  Patient currently on room air and in no distress. Cardiovascular: Regular rate and rhythm, no murmurs / rubs / gallops. No extremity edema. 2+ pedal pulses. No carotid bruits.  Abdomen: no tenderness, no masses palpated. No hepatosplenomegaly. Bowel sounds positive.  Musculoskeletal: no clubbing / cyanosis. No joint deformity upper and lower extremities. Good ROM,  no contractures. Normal muscle tone.  Skin: no rashes, lesions, ulcers. No induration Neurologic: CN 2-12 grossly intact. Sensation intact, DTR normal. Strength 5/5 in all 4.  Ambulates with use of a cane Psychiatric: Normal judgment and insight. Alert and oriented x 3. Normal mood.    Data Review   CBC w Diff:  Lab Results  Component Value Date   WBC 8.4 10/24/2018   HGB 12.2 10/24/2018   HGB 14.3 01/26/2017   HCT 38.1 10/24/2018   HCT 44.3 01/26/2017   PLT 242 10/24/2018   PLT 231 01/26/2017   LYMPHOPCT 30 10/23/2018   LYMPHOPCT 37.0 01/26/2017   MONOPCT 8 10/23/2018   MONOPCT 10.4 01/26/2017   EOSPCT 4 10/23/2018   EOSPCT 4.1 01/26/2017   BASOPCT 1 10/23/2018   BASOPCT 0.5 01/26/2017    CMP:  Lab Results  Component Value Date   NA 143 10/24/2018   NA 141 11/10/2013   K 3.1 (L) 10/24/2018   K 3.7 11/10/2013   CL 105 10/24/2018   CO2 29 10/24/2018   CO2 27 11/10/2013   BUN 6 (L)  10/24/2018   BUN 13.2 11/10/2013   CREATININE 0.85 10/24/2018   CREATININE 0.8 11/10/2013   PROT 7.6 10/23/2018   PROT 7.1 11/10/2013   ALBUMIN 4.1 10/23/2018   ALBUMIN 3.8 11/10/2013   BILITOT 1.4 (H) 10/23/2018   BILITOT 0.74 11/10/2013   ALKPHOS 55 10/23/2018   ALKPHOS 76 11/10/2013   AST 34 10/23/2018   AST 19 11/10/2013   ALT 22 10/23/2018   ALT 17 11/10/2013  .   Total Time in preparing paper work, data evaluation and todays exam - 35 minutes  Norval Morton M.D on 10/25/2018 at 9:59 PM  [facility]   Office  [phone]

## 2018-10-31 DIAGNOSIS — E876 Hypokalemia: Secondary | ICD-10-CM | POA: Diagnosis not present

## 2018-10-31 DIAGNOSIS — E1169 Type 2 diabetes mellitus with other specified complication: Secondary | ICD-10-CM | POA: Diagnosis not present

## 2018-10-31 DIAGNOSIS — Z8719 Personal history of other diseases of the digestive system: Secondary | ICD-10-CM | POA: Diagnosis not present

## 2018-10-31 DIAGNOSIS — K219 Gastro-esophageal reflux disease without esophagitis: Secondary | ICD-10-CM | POA: Diagnosis not present

## 2018-10-31 DIAGNOSIS — I10 Essential (primary) hypertension: Secondary | ICD-10-CM | POA: Diagnosis not present

## 2018-10-31 DIAGNOSIS — E559 Vitamin D deficiency, unspecified: Secondary | ICD-10-CM | POA: Diagnosis not present

## 2018-10-31 DIAGNOSIS — J449 Chronic obstructive pulmonary disease, unspecified: Secondary | ICD-10-CM | POA: Diagnosis not present

## 2018-10-31 DIAGNOSIS — E538 Deficiency of other specified B group vitamins: Secondary | ICD-10-CM | POA: Diagnosis not present

## 2018-10-31 DIAGNOSIS — D509 Iron deficiency anemia, unspecified: Secondary | ICD-10-CM | POA: Diagnosis not present

## 2018-10-31 DIAGNOSIS — Z6841 Body Mass Index (BMI) 40.0 and over, adult: Secondary | ICD-10-CM | POA: Diagnosis not present

## 2018-10-31 DIAGNOSIS — K5730 Diverticulosis of large intestine without perforation or abscess without bleeding: Secondary | ICD-10-CM | POA: Diagnosis not present

## 2018-11-07 DIAGNOSIS — L218 Other seborrheic dermatitis: Secondary | ICD-10-CM | POA: Diagnosis not present

## 2018-11-07 DIAGNOSIS — D224 Melanocytic nevi of scalp and neck: Secondary | ICD-10-CM | POA: Diagnosis not present

## 2018-11-07 DIAGNOSIS — L820 Inflamed seborrheic keratosis: Secondary | ICD-10-CM | POA: Diagnosis not present

## 2018-11-07 DIAGNOSIS — L565 Disseminated superficial actinic porokeratosis (DSAP): Secondary | ICD-10-CM | POA: Diagnosis not present

## 2018-11-07 DIAGNOSIS — D1722 Benign lipomatous neoplasm of skin and subcutaneous tissue of left arm: Secondary | ICD-10-CM | POA: Diagnosis not present

## 2018-11-07 DIAGNOSIS — D225 Melanocytic nevi of trunk: Secondary | ICD-10-CM | POA: Diagnosis not present

## 2018-11-07 DIAGNOSIS — L570 Actinic keratosis: Secondary | ICD-10-CM | POA: Diagnosis not present

## 2018-11-12 DIAGNOSIS — I10 Essential (primary) hypertension: Secondary | ICD-10-CM | POA: Diagnosis not present

## 2018-11-12 DIAGNOSIS — J449 Chronic obstructive pulmonary disease, unspecified: Secondary | ICD-10-CM | POA: Diagnosis not present

## 2018-11-12 DIAGNOSIS — Z85850 Personal history of malignant neoplasm of thyroid: Secondary | ICD-10-CM | POA: Diagnosis not present

## 2018-11-12 DIAGNOSIS — D509 Iron deficiency anemia, unspecified: Secondary | ICD-10-CM | POA: Diagnosis not present

## 2018-11-12 DIAGNOSIS — K921 Melena: Secondary | ICD-10-CM | POA: Diagnosis not present

## 2018-11-27 DIAGNOSIS — K921 Melena: Secondary | ICD-10-CM | POA: Diagnosis not present

## 2018-12-04 DIAGNOSIS — L820 Inflamed seborrheic keratosis: Secondary | ICD-10-CM | POA: Diagnosis not present

## 2018-12-04 DIAGNOSIS — L570 Actinic keratosis: Secondary | ICD-10-CM | POA: Diagnosis not present

## 2018-12-06 DIAGNOSIS — E538 Deficiency of other specified B group vitamins: Secondary | ICD-10-CM | POA: Diagnosis not present

## 2019-02-27 DIAGNOSIS — E538 Deficiency of other specified B group vitamins: Secondary | ICD-10-CM | POA: Diagnosis not present

## 2019-03-03 ENCOUNTER — Other Ambulatory Visit: Payer: Self-pay | Admitting: Internal Medicine

## 2019-03-03 DIAGNOSIS — Z1231 Encounter for screening mammogram for malignant neoplasm of breast: Secondary | ICD-10-CM

## 2019-04-07 DIAGNOSIS — E1169 Type 2 diabetes mellitus with other specified complication: Secondary | ICD-10-CM | POA: Diagnosis not present

## 2019-04-09 DIAGNOSIS — E1169 Type 2 diabetes mellitus with other specified complication: Secondary | ICD-10-CM | POA: Diagnosis not present

## 2019-04-09 DIAGNOSIS — E890 Postprocedural hypothyroidism: Secondary | ICD-10-CM | POA: Diagnosis not present

## 2019-04-09 DIAGNOSIS — J449 Chronic obstructive pulmonary disease, unspecified: Secondary | ICD-10-CM | POA: Diagnosis not present

## 2019-04-09 DIAGNOSIS — K219 Gastro-esophageal reflux disease without esophagitis: Secondary | ICD-10-CM | POA: Diagnosis not present

## 2019-04-09 DIAGNOSIS — I10 Essential (primary) hypertension: Secondary | ICD-10-CM | POA: Diagnosis not present

## 2019-04-09 DIAGNOSIS — F330 Major depressive disorder, recurrent, mild: Secondary | ICD-10-CM | POA: Diagnosis not present

## 2019-04-09 DIAGNOSIS — J45909 Unspecified asthma, uncomplicated: Secondary | ICD-10-CM | POA: Diagnosis not present

## 2019-04-09 DIAGNOSIS — Z1339 Encounter for screening examination for other mental health and behavioral disorders: Secondary | ICD-10-CM | POA: Diagnosis not present

## 2019-04-16 DIAGNOSIS — E538 Deficiency of other specified B group vitamins: Secondary | ICD-10-CM | POA: Diagnosis not present

## 2019-04-21 ENCOUNTER — Ambulatory Visit
Admission: RE | Admit: 2019-04-21 | Discharge: 2019-04-21 | Disposition: A | Discharge status: Home / Self Care | Payer: Medicare Other | Source: Ambulatory Visit | Attending: Internal Medicine | Admitting: Internal Medicine

## 2019-04-21 ENCOUNTER — Other Ambulatory Visit: Payer: Self-pay

## 2019-04-21 DIAGNOSIS — Z1231 Encounter for screening mammogram for malignant neoplasm of breast: Secondary | ICD-10-CM

## 2019-05-20 DIAGNOSIS — E538 Deficiency of other specified B group vitamins: Secondary | ICD-10-CM | POA: Diagnosis not present

## 2019-06-25 DIAGNOSIS — E538 Deficiency of other specified B group vitamins: Secondary | ICD-10-CM | POA: Diagnosis not present

## 2019-07-29 DIAGNOSIS — E538 Deficiency of other specified B group vitamins: Secondary | ICD-10-CM | POA: Diagnosis not present

## 2019-09-03 DIAGNOSIS — E538 Deficiency of other specified B group vitamins: Secondary | ICD-10-CM | POA: Diagnosis not present

## 2019-10-14 DIAGNOSIS — E538 Deficiency of other specified B group vitamins: Secondary | ICD-10-CM | POA: Diagnosis not present

## 2019-10-14 DIAGNOSIS — I10 Essential (primary) hypertension: Secondary | ICD-10-CM | POA: Diagnosis not present

## 2019-10-14 DIAGNOSIS — M810 Age-related osteoporosis without current pathological fracture: Secondary | ICD-10-CM | POA: Diagnosis not present

## 2019-10-14 DIAGNOSIS — E1169 Type 2 diabetes mellitus with other specified complication: Secondary | ICD-10-CM | POA: Diagnosis not present

## 2019-10-17 DIAGNOSIS — R82998 Other abnormal findings in urine: Secondary | ICD-10-CM | POA: Diagnosis not present

## 2019-10-17 DIAGNOSIS — I10 Essential (primary) hypertension: Secondary | ICD-10-CM | POA: Diagnosis not present

## 2019-10-21 DIAGNOSIS — E1169 Type 2 diabetes mellitus with other specified complication: Secondary | ICD-10-CM | POA: Diagnosis not present

## 2019-10-21 DIAGNOSIS — I10 Essential (primary) hypertension: Secondary | ICD-10-CM | POA: Diagnosis not present

## 2019-10-21 DIAGNOSIS — E538 Deficiency of other specified B group vitamins: Secondary | ICD-10-CM | POA: Diagnosis not present

## 2019-10-21 DIAGNOSIS — Z0000 Encounter for general adult medical examination without abnormal findings: Secondary | ICD-10-CM | POA: Diagnosis not present

## 2019-10-21 DIAGNOSIS — Z1331 Encounter for screening for depression: Secondary | ICD-10-CM | POA: Diagnosis not present

## 2019-10-21 DIAGNOSIS — J449 Chronic obstructive pulmonary disease, unspecified: Secondary | ICD-10-CM | POA: Diagnosis not present

## 2019-10-21 DIAGNOSIS — E559 Vitamin D deficiency, unspecified: Secondary | ICD-10-CM | POA: Diagnosis not present

## 2019-10-21 DIAGNOSIS — F330 Major depressive disorder, recurrent, mild: Secondary | ICD-10-CM | POA: Diagnosis not present

## 2019-10-21 DIAGNOSIS — M810 Age-related osteoporosis without current pathological fracture: Secondary | ICD-10-CM | POA: Diagnosis not present

## 2019-10-21 DIAGNOSIS — E890 Postprocedural hypothyroidism: Secondary | ICD-10-CM | POA: Diagnosis not present

## 2019-10-21 DIAGNOSIS — K219 Gastro-esophageal reflux disease without esophagitis: Secondary | ICD-10-CM | POA: Diagnosis not present

## 2019-10-21 DIAGNOSIS — D509 Iron deficiency anemia, unspecified: Secondary | ICD-10-CM | POA: Diagnosis not present

## 2019-11-25 DIAGNOSIS — E538 Deficiency of other specified B group vitamins: Secondary | ICD-10-CM | POA: Diagnosis not present

## 2019-12-24 DIAGNOSIS — J209 Acute bronchitis, unspecified: Secondary | ICD-10-CM | POA: Diagnosis not present

## 2019-12-24 DIAGNOSIS — J45909 Unspecified asthma, uncomplicated: Secondary | ICD-10-CM | POA: Diagnosis not present

## 2019-12-24 DIAGNOSIS — J449 Chronic obstructive pulmonary disease, unspecified: Secondary | ICD-10-CM | POA: Diagnosis not present

## 2019-12-24 DIAGNOSIS — Z1152 Encounter for screening for COVID-19: Secondary | ICD-10-CM | POA: Diagnosis not present

## 2019-12-24 DIAGNOSIS — E1169 Type 2 diabetes mellitus with other specified complication: Secondary | ICD-10-CM | POA: Diagnosis not present

## 2020-04-06 ENCOUNTER — Other Ambulatory Visit: Payer: Self-pay | Admitting: Internal Medicine

## 2020-04-06 DIAGNOSIS — Z1231 Encounter for screening mammogram for malignant neoplasm of breast: Secondary | ICD-10-CM

## 2020-04-08 DIAGNOSIS — J449 Chronic obstructive pulmonary disease, unspecified: Secondary | ICD-10-CM | POA: Diagnosis not present

## 2020-04-08 DIAGNOSIS — J069 Acute upper respiratory infection, unspecified: Secondary | ICD-10-CM | POA: Diagnosis not present

## 2020-04-08 DIAGNOSIS — R5383 Other fatigue: Secondary | ICD-10-CM | POA: Diagnosis not present

## 2020-04-08 DIAGNOSIS — I10 Essential (primary) hypertension: Secondary | ICD-10-CM | POA: Diagnosis not present

## 2020-04-08 DIAGNOSIS — E538 Deficiency of other specified B group vitamins: Secondary | ICD-10-CM | POA: Diagnosis not present

## 2020-04-08 DIAGNOSIS — D509 Iron deficiency anemia, unspecified: Secondary | ICD-10-CM | POA: Diagnosis not present

## 2020-04-08 DIAGNOSIS — R05 Cough: Secondary | ICD-10-CM | POA: Diagnosis not present

## 2020-04-09 ENCOUNTER — Other Ambulatory Visit (HOSPITAL_COMMUNITY): Payer: Self-pay | Admitting: *Deleted

## 2020-04-12 ENCOUNTER — Ambulatory Visit (HOSPITAL_COMMUNITY)
Admission: RE | Admit: 2020-04-12 | Discharge: 2020-04-12 | Disposition: A | Discharge status: Home / Self Care | Payer: Medicare Other | Source: Ambulatory Visit | Attending: Internal Medicine | Admitting: Internal Medicine

## 2020-04-12 ENCOUNTER — Other Ambulatory Visit: Payer: Self-pay

## 2020-04-12 DIAGNOSIS — D649 Anemia, unspecified: Secondary | ICD-10-CM | POA: Diagnosis not present

## 2020-04-12 LAB — PREPARE RBC (CROSSMATCH)

## 2020-04-12 MED ORDER — SODIUM CHLORIDE 0.9% IV SOLUTION: Freq: Once | INTRAVENOUS | Status: DC

## 2020-04-13 LAB — TYPE AND SCREEN
ABO/RH(D): A POS
Antibody Screen: NEGATIVE
Unit division: 0
Unit division: 0

## 2020-04-13 LAB — BPAM RBC
Blood Product Expiration Date: 202108182359
Blood Product Expiration Date: 202108182359
ISSUE DATE / TIME: 202107190943
ISSUE DATE / TIME: 202107191239
Unit Type and Rh: 6200
Unit Type and Rh: 6200

## 2020-04-16 ENCOUNTER — Other Ambulatory Visit: Payer: Self-pay | Admitting: Physician Assistant

## 2020-04-16 DIAGNOSIS — K449 Diaphragmatic hernia without obstruction or gangrene: Secondary | ICD-10-CM | POA: Diagnosis not present

## 2020-04-16 DIAGNOSIS — D509 Iron deficiency anemia, unspecified: Secondary | ICD-10-CM | POA: Diagnosis not present

## 2020-04-16 DIAGNOSIS — R1310 Dysphagia, unspecified: Secondary | ICD-10-CM

## 2020-04-16 DIAGNOSIS — E538 Deficiency of other specified B group vitamins: Secondary | ICD-10-CM | POA: Diagnosis not present

## 2020-04-16 DIAGNOSIS — K219 Gastro-esophageal reflux disease without esophagitis: Secondary | ICD-10-CM | POA: Diagnosis not present

## 2020-04-16 DIAGNOSIS — J449 Chronic obstructive pulmonary disease, unspecified: Secondary | ICD-10-CM | POA: Diagnosis not present

## 2020-04-20 ENCOUNTER — Other Ambulatory Visit: Payer: Self-pay | Admitting: Physician Assistant

## 2020-04-20 ENCOUNTER — Ambulatory Visit
Admission: RE | Admit: 2020-04-20 | Discharge: 2020-04-20 | Disposition: A | Discharge status: Home / Self Care | Payer: Medicare Other | Source: Ambulatory Visit | Attending: Physician Assistant | Admitting: Physician Assistant

## 2020-04-20 DIAGNOSIS — R1310 Dysphagia, unspecified: Secondary | ICD-10-CM

## 2020-04-20 DIAGNOSIS — K224 Dyskinesia of esophagus: Secondary | ICD-10-CM | POA: Diagnosis not present

## 2020-04-21 DIAGNOSIS — D509 Iron deficiency anemia, unspecified: Secondary | ICD-10-CM | POA: Diagnosis not present

## 2020-04-21 DIAGNOSIS — R5383 Other fatigue: Secondary | ICD-10-CM | POA: Diagnosis not present

## 2020-04-21 DIAGNOSIS — E538 Deficiency of other specified B group vitamins: Secondary | ICD-10-CM | POA: Diagnosis not present

## 2020-04-21 DIAGNOSIS — J069 Acute upper respiratory infection, unspecified: Secondary | ICD-10-CM | POA: Diagnosis not present

## 2020-04-22 ENCOUNTER — Ambulatory Visit: Payer: Medicare Other

## 2020-05-03 ENCOUNTER — Other Ambulatory Visit: Payer: Self-pay | Admitting: Hematology and Oncology

## 2020-05-03 DIAGNOSIS — D509 Iron deficiency anemia, unspecified: Secondary | ICD-10-CM

## 2020-05-04 DIAGNOSIS — E1169 Type 2 diabetes mellitus with other specified complication: Secondary | ICD-10-CM | POA: Diagnosis not present

## 2020-05-04 DIAGNOSIS — K219 Gastro-esophageal reflux disease without esophagitis: Secondary | ICD-10-CM | POA: Diagnosis not present

## 2020-05-04 DIAGNOSIS — I10 Essential (primary) hypertension: Secondary | ICD-10-CM | POA: Diagnosis not present

## 2020-05-04 DIAGNOSIS — F330 Major depressive disorder, recurrent, mild: Secondary | ICD-10-CM | POA: Diagnosis not present

## 2020-05-04 DIAGNOSIS — J449 Chronic obstructive pulmonary disease, unspecified: Secondary | ICD-10-CM | POA: Diagnosis not present

## 2020-05-04 DIAGNOSIS — D500 Iron deficiency anemia secondary to blood loss (chronic): Secondary | ICD-10-CM | POA: Diagnosis not present

## 2020-05-04 DIAGNOSIS — R5383 Other fatigue: Secondary | ICD-10-CM | POA: Diagnosis not present

## 2020-05-05 DIAGNOSIS — D509 Iron deficiency anemia, unspecified: Secondary | ICD-10-CM | POA: Diagnosis not present

## 2020-05-06 ENCOUNTER — Inpatient Hospital Stay: Payer: Medicare Other | Attending: Hematology and Oncology | Admitting: Hematology and Oncology

## 2020-05-06 ENCOUNTER — Inpatient Hospital Stay: Payer: Medicare Other

## 2020-05-06 ENCOUNTER — Other Ambulatory Visit: Payer: Self-pay

## 2020-05-06 ENCOUNTER — Encounter: Payer: Self-pay | Admitting: Hematology and Oncology

## 2020-05-06 VITALS — BP 141/80 | HR 84 | Temp 97.9°F | Resp 17 | Ht 63.0 in | Wt 208.2 lb

## 2020-05-06 DIAGNOSIS — Z87891 Personal history of nicotine dependence: Secondary | ICD-10-CM | POA: Diagnosis not present

## 2020-05-06 DIAGNOSIS — Z85850 Personal history of malignant neoplasm of thyroid: Secondary | ICD-10-CM

## 2020-05-06 DIAGNOSIS — Z23 Encounter for immunization: Secondary | ICD-10-CM | POA: Diagnosis not present

## 2020-05-06 DIAGNOSIS — Z841 Family history of disorders of kidney and ureter: Secondary | ICD-10-CM | POA: Insufficient documentation

## 2020-05-06 DIAGNOSIS — R5383 Other fatigue: Secondary | ICD-10-CM

## 2020-05-06 DIAGNOSIS — Z808 Family history of malignant neoplasm of other organs or systems: Secondary | ICD-10-CM | POA: Diagnosis not present

## 2020-05-06 DIAGNOSIS — D509 Iron deficiency anemia, unspecified: Secondary | ICD-10-CM | POA: Diagnosis not present

## 2020-05-06 DIAGNOSIS — Z79899 Other long term (current) drug therapy: Secondary | ICD-10-CM | POA: Insufficient documentation

## 2020-05-06 DIAGNOSIS — D500 Iron deficiency anemia secondary to blood loss (chronic): Secondary | ICD-10-CM | POA: Diagnosis not present

## 2020-05-06 DIAGNOSIS — Z299 Encounter for prophylactic measures, unspecified: Secondary | ICD-10-CM | POA: Insufficient documentation

## 2020-05-06 DIAGNOSIS — Z8249 Family history of ischemic heart disease and other diseases of the circulatory system: Secondary | ICD-10-CM | POA: Insufficient documentation

## 2020-05-06 DIAGNOSIS — R1310 Dysphagia, unspecified: Secondary | ICD-10-CM | POA: Insufficient documentation

## 2020-05-06 LAB — COMPREHENSIVE METABOLIC PANEL
ALT: 9 U/L (ref 0–44)
AST: 15 U/L (ref 15–41)
Albumin: 3.8 g/dL (ref 3.5–5.0)
Alkaline Phosphatase: 62 U/L (ref 38–126)
Anion gap: 9 (ref 5–15)
BUN: 13 mg/dL (ref 8–23)
CO2: 27 mmol/L (ref 22–32)
Calcium: 9.4 mg/dL (ref 8.9–10.3)
Chloride: 105 mmol/L (ref 98–111)
Creatinine, Ser: 0.82 mg/dL (ref 0.44–1.00)
GFR calc Af Amer: >60 mL/min (ref >60)
GFR calc non Af Amer: >60 mL/min (ref >60)
Glucose, Bld: 99 mg/dL (ref 70–99)
Potassium: 3.5 mmol/L (ref 3.5–5.1)
Sodium: 141 mmol/L (ref 135–145)
Total Bilirubin: 0.8 mg/dL (ref 0.3–1.2)
Total Protein: 7.1 g/dL (ref 6.5–8.1)

## 2020-05-06 LAB — CBC WITH DIFFERENTIAL/PLATELET
Abs Immature Granulocytes: 0.02 10*3/uL (ref 0.00–0.07)
Basophils Absolute: 0.1 10*3/uL (ref 0.0–0.1)
Basophils Relative: 1 %
Eosinophils Absolute: 0.4 10*3/uL (ref 0.0–0.5)
Eosinophils Relative: 6 %
HCT: 32.9 % — ABNORMAL LOW (ref 36.0–46.0)
Hemoglobin: 9.3 g/dL — ABNORMAL LOW (ref 12.0–15.0)
Immature Granulocytes: 0 %
Lymphocytes Relative: 27 %
Lymphs Abs: 2.2 10*3/uL (ref 0.7–4.0)
MCH: 20.2 pg — ABNORMAL LOW (ref 26.0–34.0)
MCHC: 28.3 g/dL — ABNORMAL LOW (ref 30.0–36.0)
MCV: 71.5 fL — ABNORMAL LOW (ref 80.0–100.0)
Monocytes Absolute: 0.7 10*3/uL (ref 0.1–1.0)
Monocytes Relative: 8 %
Neutro Abs: 4.6 10*3/uL (ref 1.7–7.7)
Neutrophils Relative %: 58 %
Platelets: 392 10*3/uL (ref 150–400)
RBC: 4.6 MIL/uL (ref 3.87–5.11)
RDW: 19.9 % — ABNORMAL HIGH (ref 11.5–15.5)
WBC: 8 10*3/uL (ref 4.0–10.5)
nRBC: 0 % (ref 0.0–0.2)

## 2020-05-06 LAB — SAMPLE TO BLOOD BANK

## 2020-05-06 LAB — SEDIMENTATION RATE: Sed Rate: 6 mm/hr (ref 0–22)

## 2020-05-06 NOTE — Progress Notes (Signed)
[facility] FOLLOW-UP progress notes  Patient Care Team: Velna Hatchet, MD as PCP - General (Internal Medicine) Laurence Spates, MD (Inactive) as Consulting Physician (Gastroenterology)  CHIEF COMPLAINTS/PURPOSE OF VISIT:  Recurrent iron deficiency anemia  HISTORY OF PRESENTING ILLNESS:  Angelica [name] 79 y.o. female is seen again for recurrent iron deficiency anemia She is billed as a new patient today because it has been more than 3 years since she was last seen here She was lost to follow-up She has received blood recently The patient denies any recent signs or symptoms of bleeding such as spontaneous epistaxis, hematuria or hematochezia. She complains of recent weight loss Denies recent nausea, changes in bowel habits or abdominal pain She denies the use of NSAID  I reviewed the patient's records extensive and collaborated the history with the patient. Summary of her history is as follows:  This patient has been anemic for many years. I was able to review her CBC from 2010 and noted that she had progressive microcytic anemia. In 2013, she has severe anemia requiring blood transfusion. She underwent EGD and colonoscopy in 2013 which only showed a large hiatal hernia but no evidence of ulcer or source of bleeding. She had significant pica with excessive chewing of ice. Her last colonoscopy in 2013 was negative She received intravenous iron infusion on 11/26/2013, 04/09/2014 and 08/24/14.  She also received blood transfusion on 04/09/2014 and 08/19/14 for severe anemia. From January 2016 to June 2016, she needed iron infusion on a monthly basis. On 03/05/2015, she received 1 unit of blood. On 03/12/2015, colonoscopy showed no evidence of active bleeding or hemorrhoids. From 2016 to 2017, she received multiple doses of IV iron Her primary care doctor has prescribed vitamin B-12 injection. She was lost to follow-up after 2017 She received 2 units of blood transfusion in  July 2021  MEDICAL HISTORY:  Past Medical History:  Diagnosis Date  . Anemia   . Arthritis   . Asthma   . Celiac disease 11/17/2013  . COPD (chronic obstructive pulmonary disease) (HCC)    on 2L prn home O2  . Hypertension   . Rectal bleed 10/23/2018  . Thyroid cancer ([place])    remote    SURGICAL HISTORY: Past Surgical History:  Procedure Laterality Date  . ABDOMINAL HYSTERECTOMY    . BREAST EXCISIONAL BIOPSY Bilateral   . CHOLECYSTECTOMY    . COLONOSCOPY  07/05/2012   Procedure: COLONOSCOPY;  Surgeon: Winfield Cunas., MD;  Location: [facility] ENDOSCOPY;  Service: Endoscopy;  Laterality: N/A;  . COLONOSCOPY N/A 03/12/2015   Procedure: COLONOSCOPY;  Surgeon: Laurence Spates, MD;  Location: [facility] ENDOSCOPY;  Service: Endoscopy;  Laterality: N/A;  . ESOPHAGOGASTRODUODENOSCOPY  07/05/2012   Procedure: ESOPHAGOGASTRODUODENOSCOPY (EGD);  Surgeon: Winfield Cunas., MD;  Location: [facility] ENDOSCOPY;  Service: Endoscopy;  Laterality: N/A;  . HOT HEMOSTASIS N/A 03/12/2015   Procedure: HOT HEMOSTASIS (ARGON PLASMA COAGULATION/BICAP);  Surgeon: Laurence Spates, MD;  Location: [facility] ENDOSCOPY;  Service: Endoscopy;  Laterality: N/A;  . TONSILLECTOMY      SOCIAL HISTORY: Social History   Socioeconomic History  . Marital status: Widowed    Spouse name: Not on file  . Number of children: Not on file  . Years of education: Not on file  . Highest education level: Not on file  Occupational History  . Occupation: retired  Tobacco Use  . Smoking status: Former Smoker    Packs/day: 0.75    Years: 60.00    Pack years: 45.00  Types: Cigarettes    Quit date: 07/03/2008    Years since quitting: 11.8  . Smokeless tobacco: Never Used  Vaping Use  . Vaping Use: Never used  Substance and Sexual Activity  . Alcohol use: No  . Drug use: No  . Sexual activity: Not on file  Other Topics Concern  . Not on file  Social History Narrative  . Not on file   Social Determinants of Health   Financial Resource  Strain:   . Difficulty of Paying Living Expenses:   Food Insecurity:   . Worried About Charity fundraiser in the Last Year:   . Arboriculturist in the Last Year:   Transportation Needs:   . Film/video editor (Medical):   Marland Kitchen Lack of Transportation (Non-Medical):   Physical Activity:   . Days of Exercise per Week:   . Minutes of Exercise per Session:   Stress:   . Feeling of Stress :   Social Connections:   . Frequency of Communication with Friends and Family:   . Frequency of Social Gatherings with Friends and Family:   . Attends Religious Services:   . Active Member of Clubs or Organizations:   . Attends Archivist Meetings:   Marland Kitchen Marital Status:   Intimate Partner Violence:   . Fear of Current or Ex-Partner:   . Emotionally Abused:   Marland Kitchen Physically Abused:   . Sexually Abused:     FAMILY HISTORY: Family History  Problem Relation Age of Onset  . Heart disease Mother   . Kidney failure Father   . Cancer Sister        thyroid cancer    ALLERGIES:  is allergic to other, fosamax [alendronate sodium], penicillins, and sulfa antibiotics.  MEDICATIONS:  Current Outpatient Medications  Medication Sig Dispense Refill  . acetaminophen (TYLENOL) 500 MG tablet Take 500 mg by mouth every 6 (six) hours as needed (body ache).    Marland Kitchen albuterol (PROVENTIL HFA;VENTOLIN HFA) 108 (90 Base) MCG/ACT inhaler Inhale into the lungs every 6 (six) hours as needed for wheezing or shortness of breath.    Marland Kitchen amLODipine (NORVASC) 5 MG tablet Take 5 mg by mouth at bedtime.     . fluticasone (FLONASE) 50 MCG/ACT nasal spray   5  . hydrocortisone (ANUSOL-HC) 25 MG suppository Place 1 suppository (25 mg total) rectally 2 (two) times daily as needed for hemorrhoids or anal itching. 12 suppository 0  . levothyroxine (SYNTHROID, LEVOTHROID) 125 MCG tablet Take 125 mcg by mouth daily.    Marland Kitchen loratadine (CLARITIN) 10 MG tablet Take 10 mg by mouth as needed for allergies or rhinitis.     Marland Kitchen montelukast  (SINGULAIR) 10 MG tablet Take 10 mg by mouth as needed (allergies).   5  . OXYGEN Inhale 2 L into the lungs as needed (LOW OXYGEN).    . pantoprazole (PROTONIX) 40 MG tablet Take 1 tablet (40 mg total) by mouth daily. 30 tablet 0  . polyethylene glycol (MIRALAX) packet Take 17 g by mouth 2 (two) times daily. 14 each 0   No current facility-administered medications for this visit.    REVIEW OF SYSTEMS:   Constitutional: Denies fevers, chills or abnormal night sweats Eyes: Denies blurriness of vision, double vision or watery eyes Ears, nose, mouth, throat, and face: Denies mucositis or sore throat Respiratory: Denies cough, dyspnea or wheezes Cardiovascular: Denies palpitation, chest discomfort or lower extremity swelling Gastrointestinal:  Denies nausea, heartburn or change in bowel habits Skin:  Denies abnormal skin rashes Lymphatics: Denies new lymphadenopathy or easy bruising Neurological:Denies numbness, tingling or new weaknesses Behavioral/Psych: Mood is stable, no new changes  All other systems were reviewed with the patient and are negative.  PHYSICAL EXAMINATION: ECOG PERFORMANCE STATUS: 1 - Symptomatic but completely ambulatory  Vitals:   05/06/20 1412  BP: (!) 141/80  Pulse: 84  Resp: 17  Temp: 97.9 F (36.6 C)  SpO2: 97%   Filed Weights   05/06/20 1412  Weight: 208 lb 3.2 oz (94.4 kg)    GENERAL:alert, no distress and comfortable.  She looks pale SKIN: skin color, texture, turgor are normal, no rashes or significant lesions EYES: normal, conjunctiva are pink and non-injected, sclera clear OROPHARYNX:no exudate, normal lips, buccal mucosa, and tongue  NECK: supple, thyroid normal size, non-tender, without nodularity LYMPH:  no palpable lymphadenopathy in the cervical, axillary or inguinal LUNGS: clear to auscultation and percussion with normal breathing effort HEART: regular rate & rhythm and no murmurs without lower extremity edema ABDOMEN:abdomen soft,  non-tender and normal bowel sounds Musculoskeletal:no cyanosis of digits and no clubbing  PSYCH: alert & oriented x 3 with fluent speech NEURO: no focal motor/sensory deficits  LABORATORY DATA:  I have reviewed the data as listed Lab Results  Component Value Date   WBC 8.0 05/06/2020   HGB 9.3 (L) 05/06/2020   HCT 32.9 (L) 05/06/2020   MCV 71.5 (L) 05/06/2020   PLT 392 05/06/2020   Recent Labs    05/06/20 1441  NA 141  K 3.5  CL 105  CO2 27  GLUCOSE 99  BUN 13  CREATININE 0.82  CALCIUM 9.4  GFRNONAA >60  GFRAA >60  PROT 7.1  ALBUMIN 3.8  AST 15  ALT 9  ALKPHOS 62  BILITOT 0.8    RADIOGRAPHIC STUDIES: I have personally reviewed the radiological images as listed and agreed with the findings in the report. DG ESOPHAGUS W SINGLE CM (SOL OR THIN BA)  Result Date: 04/20/2020 CLINICAL DATA:  Dysphagia EXAM: ESOPHOGRAM/BARIUM SWALLOW TECHNIQUE: Single contrast examination was performed using  thin barium. FLUOROSCOPY TIME:  Fluoroscopy Time:  0 minutes 42 second Radiation Exposure Index (if provided by the fluoroscopic device): Number of Acquired Spot Images: 0 COMPARISON:  None. FINDINGS: Limited study. The patient had limited mobility and refused to swallow barium tablet. It was decided to perform a thin barium single-contrast study due to these factors. 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 No mass or stricture. There is a large hiatal hernia with approximately 1/2 of the stomach in the hiatal hernia. The study was terminated early with the patient was not able to continue. IMPRESSION: Limited study. Poor esophageal motility. Large hiatal hernia. Negative for stricture. Electronically Signed   By: Franchot Gallo M.D.   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 We discussed some of the risks, benefits, and alternatives of intravenous iron infusions. The patient is symptomatic from anemia and the iron level is critically low. She tolerated oral iron supplement poorly and desires to achieved higher levels of iron faster for adequate hematopoesis. Some of the side-effects to be expected including risks of infusion reactions, phlebitis, headaches, nausea and fatigue.  The patient is willing to proceed. Patient education material was dispensed.  Goal is to keep ferritin level  greater than 50 and resolution of anemia We will resume intravenous iron infusion next week I plan to see her back in October for further follow-up If she continues to have difficulties retaining her iron, I will refer her back to GI service for EGD and colonoscopy   Other fatigue She has history of thyroid disorder She told me she has lost some weight I will recheck her thyroid function and will call her with test results  Preventive measure I recommend the patient proceed with Covid vaccination If she is not able to get Covid vaccine in a local store, we will administer here when she returns to get her IV iron next week   Orders Placed This Encounter  Procedures  . CBC with Differential/Platelet    Standing Status:   Standing    Number of Occurrences:   22    Standing Expiration Date:   05/06/2021  . Comprehensive metabolic panel    Standing Status:   Standing    Number of Occurrences:   33    Standing Expiration Date:   05/06/2021  . TSH    Standing Status:   Standing    Number of Occurrences:   22    Standing Expiration Date:   05/06/2021  . Iron and TIBC    Standing Status:   Future    Number of Occurrences:   1    Standing Expiration Date:   05/06/2021  . Ferritin    Standing Status:   Future    Number of Occurrences:   1    Standing Expiration Date:   05/06/2021  . Sedimentation rate    Standing Status:   Future    Number of  Occurrences:   1    Standing Expiration Date:   05/06/2021  . Sample to Blood Bank    Standing Status:   Standing    Number of Occurrences:   33    Standing Expiration Date:   05/06/2021    All questions were answered. The patient knows to call the clinic with any problems, questions or concerns. The total time spent in the appointment was 55 minutes encounter with patients including review of chart and various tests results, discussions about plan of care and coordination of care plan   [name], MD 05/06/2020 4:09 PM

## 2020-05-06 NOTE — Assessment & Plan Note (Signed)
I recommend the patient proceed with Covid vaccination If she is not able to get Covid vaccine in a local store, we will administer here when she returns to get her IV iron next week

## 2020-05-06 NOTE — Assessment & Plan Note (Signed)
She has history of thyroid disorder She told me she has lost some weight I will recheck her thyroid function and will call her with test results

## 2020-05-06 NOTE — Assessment & Plan Note (Signed)
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 We discussed some of the risks, benefits, and alternatives of intravenous iron infusions. The patient is symptomatic from anemia and the iron level is critically low. She tolerated oral iron supplement poorly and desires to achieved higher levels of iron faster for adequate hematopoesis. Some of the side-effects to be expected including risks of infusion reactions, phlebitis, headaches, nausea and fatigue.  The patient is willing to proceed. Patient education material was dispensed.  Goal is to keep ferritin level greater than 50 and resolution of anemia We will resume intravenous iron infusion next week I plan to see her back in October for further follow-up If she continues to have difficulties retaining her iron, I will refer her back to GI service for EGD and colonoscopy

## 2020-05-07 LAB — IRON AND TIBC
Iron: 18 ug/dL — ABNORMAL LOW (ref 41–142)
Saturation Ratios: 4 % — ABNORMAL LOW (ref 21–57)
TIBC: 438 ug/dL (ref 236–444)
UIBC: 419 ug/dL — ABNORMAL HIGH (ref 120–384)

## 2020-05-07 LAB — TSH: TSH: <0.08 u[IU]/mL — ABNORMAL LOW (ref 0.308–3.960)

## 2020-05-07 LAB — FERRITIN: Ferritin: <4 ng/mL — ABNORMAL LOW (ref 11–307)

## 2020-05-14 ENCOUNTER — Inpatient Hospital Stay: Payer: Medicare Other

## 2020-05-14 ENCOUNTER — Other Ambulatory Visit: Payer: Self-pay

## 2020-05-14 ENCOUNTER — Telehealth: Payer: Self-pay | Admitting: Hematology and Oncology

## 2020-05-14 VITALS — BP 151/87 | HR 77 | Temp 98.0°F | Resp 18

## 2020-05-14 DIAGNOSIS — Z79899 Other long term (current) drug therapy: Secondary | ICD-10-CM | POA: Diagnosis not present

## 2020-05-14 DIAGNOSIS — Z87891 Personal history of nicotine dependence: Secondary | ICD-10-CM | POA: Diagnosis not present

## 2020-05-14 DIAGNOSIS — D509 Iron deficiency anemia, unspecified: Secondary | ICD-10-CM

## 2020-05-14 DIAGNOSIS — R5383 Other fatigue: Secondary | ICD-10-CM | POA: Diagnosis not present

## 2020-05-14 DIAGNOSIS — Z23 Encounter for immunization: Secondary | ICD-10-CM | POA: Diagnosis not present

## 2020-05-14 DIAGNOSIS — D500 Iron deficiency anemia secondary to blood loss (chronic): Secondary | ICD-10-CM | POA: Diagnosis not present

## 2020-05-14 DIAGNOSIS — R1310 Dysphagia, unspecified: Secondary | ICD-10-CM | POA: Diagnosis not present

## 2020-05-14 MED ORDER — SODIUM CHLORIDE 0.9 % IV SOLN
510.0000 mg | Freq: Once | INTRAVENOUS | Status: AC
  Administered 2020-05-14: 510 mg via INTRAVENOUS
  Filled 2020-05-14: qty 510

## 2020-05-14 MED ORDER — SODIUM CHLORIDE 0.9 % IV SOLN
Freq: Once | INTRAVENOUS | Status: AC
  Filled 2020-05-14: qty 250

## 2020-05-14 NOTE — Patient Instructions (Signed)

## 2020-05-14 NOTE — Telephone Encounter (Signed)
Scheduled 2nd covid vaccine per secure chat msg from charge nurse Amy.

## 2020-05-14 NOTE — Progress Notes (Signed)
Patient observed for 15 minutes post injection. Pt with no complaints, states "I feel fine".

## 2020-05-14 NOTE — Progress Notes (Signed)
Pt remained for entire 30 min post-infusion observation period.  Tolerated well.  VS stable.

## 2020-05-21 ENCOUNTER — Inpatient Hospital Stay: Payer: Medicare Other

## 2020-05-21 ENCOUNTER — Other Ambulatory Visit: Payer: Self-pay

## 2020-05-21 VITALS — BP 108/60 | HR 72 | Temp 98.5°F | Resp 18

## 2020-05-21 DIAGNOSIS — Z23 Encounter for immunization: Secondary | ICD-10-CM | POA: Diagnosis not present

## 2020-05-21 DIAGNOSIS — R1310 Dysphagia, unspecified: Secondary | ICD-10-CM | POA: Diagnosis not present

## 2020-05-21 DIAGNOSIS — Z87891 Personal history of nicotine dependence: Secondary | ICD-10-CM | POA: Diagnosis not present

## 2020-05-21 DIAGNOSIS — D509 Iron deficiency anemia, unspecified: Secondary | ICD-10-CM

## 2020-05-21 DIAGNOSIS — Z79899 Other long term (current) drug therapy: Secondary | ICD-10-CM | POA: Diagnosis not present

## 2020-05-21 DIAGNOSIS — R5383 Other fatigue: Secondary | ICD-10-CM | POA: Diagnosis not present

## 2020-05-21 DIAGNOSIS — D500 Iron deficiency anemia secondary to blood loss (chronic): Secondary | ICD-10-CM | POA: Diagnosis not present

## 2020-05-21 MED ORDER — SODIUM CHLORIDE 0.9 % IV SOLN
510.0000 mg | Freq: Once | INTRAVENOUS | Status: AC
  Administered 2020-05-21: 510 mg via INTRAVENOUS
  Filled 2020-05-21: qty 510

## 2020-05-21 MED ORDER — SODIUM CHLORIDE 0.9 % IV SOLN
Freq: Once | INTRAVENOUS | Status: AC
  Filled 2020-05-21: qty 250

## 2020-05-21 NOTE — Patient Instructions (Signed)

## 2020-05-27 DIAGNOSIS — E538 Deficiency of other specified B group vitamins: Secondary | ICD-10-CM | POA: Diagnosis not present

## 2020-06-02 DIAGNOSIS — K449 Diaphragmatic hernia without obstruction or gangrene: Secondary | ICD-10-CM | POA: Diagnosis not present

## 2020-06-02 DIAGNOSIS — D500 Iron deficiency anemia secondary to blood loss (chronic): Secondary | ICD-10-CM | POA: Diagnosis not present

## 2020-06-04 ENCOUNTER — Other Ambulatory Visit: Payer: Self-pay

## 2020-06-04 ENCOUNTER — Inpatient Hospital Stay: Payer: Medicare Other | Attending: Hematology and Oncology

## 2020-06-04 DIAGNOSIS — D500 Iron deficiency anemia secondary to blood loss (chronic): Secondary | ICD-10-CM | POA: Insufficient documentation

## 2020-06-04 DIAGNOSIS — Z23 Encounter for immunization: Secondary | ICD-10-CM

## 2020-06-04 NOTE — Progress Notes (Signed)
   Covid-19 Vaccination Clinic  Name:  Angelica [name]    MRN: [id number] DOB: 05-01-1941  06/04/2020  Angelica [name] was observed post Covid-19 immunization for 15 minutes without incident. She was provided with Vaccine Information Sheet and instruction to access the V-Safe system.   Angelica [name] was instructed to call 911 with any severe reactions post vaccine: Marland Kitchen Difficulty breathing  . Swelling of face and throat  . A fast heartbeat  . A bad rash all over body  . Dizziness and weakness   Immunizations Administered    Name Date Dose VIS Date Route   Pfizer COVID-19 Vaccine 06/04/2020  5:15 PM 0.3 mL 11/19/2018 Intramuscular   Manufacturer: [employer], [employer]   Lot: [id number]   [place]: [id number]

## 2020-07-02 ENCOUNTER — Encounter: Payer: Self-pay | Admitting: Hematology and Oncology

## 2020-07-02 ENCOUNTER — Other Ambulatory Visit: Payer: Self-pay

## 2020-07-02 ENCOUNTER — Inpatient Hospital Stay: Payer: Medicare Other

## 2020-07-02 ENCOUNTER — Inpatient Hospital Stay: Payer: Medicare Other | Attending: Hematology and Oncology | Admitting: Hematology and Oncology

## 2020-07-02 DIAGNOSIS — R5383 Other fatigue: Secondary | ICD-10-CM | POA: Diagnosis not present

## 2020-07-02 DIAGNOSIS — K909 Intestinal malabsorption, unspecified: Secondary | ICD-10-CM | POA: Diagnosis not present

## 2020-07-02 DIAGNOSIS — R634 Abnormal weight loss: Secondary | ICD-10-CM | POA: Insufficient documentation

## 2020-07-02 DIAGNOSIS — D508 Other iron deficiency anemias: Secondary | ICD-10-CM | POA: Insufficient documentation

## 2020-07-02 DIAGNOSIS — K922 Gastrointestinal hemorrhage, unspecified: Secondary | ICD-10-CM | POA: Insufficient documentation

## 2020-07-02 DIAGNOSIS — Z85850 Personal history of malignant neoplasm of thyroid: Secondary | ICD-10-CM

## 2020-07-02 DIAGNOSIS — Z79899 Other long term (current) drug therapy: Secondary | ICD-10-CM | POA: Diagnosis not present

## 2020-07-02 DIAGNOSIS — D509 Iron deficiency anemia, unspecified: Secondary | ICD-10-CM | POA: Diagnosis not present

## 2020-07-02 LAB — SAMPLE TO BLOOD BANK

## 2020-07-02 LAB — CBC WITH DIFFERENTIAL/PLATELET
Abs Immature Granulocytes: 0.01 10*3/uL (ref 0.00–0.07)
Basophils Absolute: 0.1 10*3/uL (ref 0.0–0.1)
Basophils Relative: 2 %
Eosinophils Absolute: 0.3 10*3/uL (ref 0.0–0.5)
Eosinophils Relative: 5 %
HCT: 42.2 % (ref 36.0–46.0)
Hemoglobin: 12.9 g/dL (ref 12.0–15.0)
Immature Granulocytes: 0 %
Lymphocytes Relative: 32 %
Lymphs Abs: 1.9 10*3/uL (ref 0.7–4.0)
MCH: 24.9 pg — ABNORMAL LOW (ref 26.0–34.0)
MCHC: 30.6 g/dL (ref 30.0–36.0)
MCV: 81.3 fL (ref 80.0–100.0)
Monocytes Absolute: 0.6 10*3/uL (ref 0.1–1.0)
Monocytes Relative: 10 %
Neutro Abs: 3.1 10*3/uL (ref 1.7–7.7)
Neutrophils Relative %: 51 %
Platelets: 265 10*3/uL (ref 150–400)
RBC: 5.19 MIL/uL — ABNORMAL HIGH (ref 3.87–5.11)
RDW: 21.9 % — ABNORMAL HIGH (ref 11.5–15.5)
WBC: 5.9 10*3/uL (ref 4.0–10.5)
nRBC: 0 % (ref 0.0–0.2)

## 2020-07-02 LAB — IRON AND TIBC
Iron: 40 ug/dL — ABNORMAL LOW (ref 41–142)
Saturation Ratios: 13 % — ABNORMAL LOW (ref 21–57)
TIBC: 318 ug/dL (ref 236–444)
UIBC: 277 ug/dL (ref 120–384)

## 2020-07-02 LAB — COMPREHENSIVE METABOLIC PANEL
ALT: 14 U/L (ref 0–44)
AST: 18 U/L (ref 15–41)
Albumin: 3.7 g/dL (ref 3.5–5.0)
Alkaline Phosphatase: 61 U/L (ref 38–126)
Anion gap: 6 (ref 5–15)
BUN: 13 mg/dL (ref 8–23)
CO2: 33 mmol/L — ABNORMAL HIGH (ref 22–32)
Calcium: 9.3 mg/dL (ref 8.9–10.3)
Chloride: 102 mmol/L (ref 98–111)
Creatinine, Ser: 0.79 mg/dL (ref 0.44–1.00)
GFR, Estimated: >60 mL/min (ref >60)
Glucose, Bld: 112 mg/dL — ABNORMAL HIGH (ref 70–99)
Potassium: 3.2 mmol/L — ABNORMAL LOW (ref 3.5–5.1)
Sodium: 141 mmol/L (ref 135–145)
Total Bilirubin: 0.8 mg/dL (ref 0.3–1.2)
Total Protein: 7.2 g/dL (ref 6.5–8.1)

## 2020-07-02 LAB — TSH: TSH: <0.08 u[IU]/mL — ABNORMAL LOW (ref 0.308–3.960)

## 2020-07-02 LAB — FERRITIN: Ferritin: 27 ng/mL (ref 11–307)

## 2020-07-02 NOTE — Progress Notes (Signed)
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 Her hemoglobin is completely normal Iron studies are still pending, will call the patient with test results I plan to see her again in 6 months for further follow-up The patient is educated to watch out for signs and symptoms of iron deficiency anemia and will call me if she have symptoms sooner  Other fatigue She has symptoms of excessive fatigue I will check her thyroid function and will call her with test results   No orders of the defined types were placed in this encounter.   The total time spent in the appointment was 15 minutes encounter with patients including review of chart and various tests results, discussions about plan of care and coordination of care plan   All questions were answered. The patient knows to call the clinic with any problems, questions or concerns. No barriers to learning was detected.    [name], MD 10/8/20211:12 PM  INTERVAL HISTORY: IMA HAFNER 79 y.o. female returns for follow-up on recurrent iron deficiency anemia She tolerated IV iron well She has excellent energy level Denies pica She complained of recent excessive fatigue The patient denies any recent signs or symptoms of bleeding such as spontaneous epistaxis, hematuria or hematochezia.    SUMMARY OF HEMATOLOGIC HISTORY: ALETTE KATAOKA is seen again for recurrent iron deficiency anemia She is billed as a new patient today because it has been more than 3 years since she was last seen here She was lost to follow-up She has received blood recently The patient denies any recent signs or symptoms of bleeding such as spontaneous epistaxis, hematuria or hematochezia. She complains of recent weight  loss Denies recent nausea, changes in bowel habits or abdominal pain She denies the use of NSAID  I reviewed the patient's records extensive and collaborated the history with the patient. Summary of her history is as follows:  This patient has been anemic for many years. I was able to review her CBC from 2010 and noted that she had progressive microcytic anemia. In 2013, she has severe anemia requiring blood transfusion. She underwent EGD and colonoscopy in 2013 which only showed a large hiatal hernia but no evidence of ulcer or source of bleeding. She had significant pica with excessive chewing of ice. Her last colonoscopy in 2013 was negative She received intravenous iron infusion on 11/26/2013, 04/09/2014 and 08/24/14.  She also received blood transfusion on 04/09/2014 and 08/19/14 for severe anemia. From January 2016 to June 2016, she needed iron infusion on a monthly basis. On 03/05/2015, she received 1 unit of blood. On 03/12/2015, colonoscopy showed no evidence of active bleeding or hemorrhoids. From 2016 to 2017, she received multiple doses of IV iron Her primary care doctor has prescribed vitamin B-12 injection. She was lost to follow-up after 2017 She received 2 units of blood transfusion in July 2021 followed by IV iron in August  I have reviewed the past medical history, past surgical history, social history and family history with the patient and they are unchanged from previous note.  ALLERGIES:  is allergic to other, fosamax [alendronate sodium], penicillins, and sulfa antibiotics.  MEDICATIONS:  Current Outpatient Medications  Medication Sig Dispense Refill  . acetaminophen (TYLENOL) 500 MG tablet Take 500 mg by mouth  every 6 (six) hours as needed (body ache).    Marland Kitchen albuterol (PROVENTIL HFA;VENTOLIN HFA) 108 (90 Base) MCG/ACT inhaler Inhale into the lungs every 6 (six) hours as needed for wheezing or shortness of breath.    Marland Kitchen amLODipine (NORVASC) 5 MG tablet Take 5 mg by  mouth at bedtime.     . fluticasone (FLONASE) 50 MCG/ACT nasal spray   5  . hydrocortisone (ANUSOL-HC) 25 MG suppository Place 1 suppository (25 mg total) rectally 2 (two) times daily as needed for hemorrhoids or anal itching. 12 suppository 0  . levothyroxine (SYNTHROID, LEVOTHROID) 125 MCG tablet Take 125 mcg by mouth daily.    Marland Kitchen loratadine (CLARITIN) 10 MG tablet Take 10 mg by mouth as needed for allergies or rhinitis.     Marland Kitchen montelukast (SINGULAIR) 10 MG tablet Take 10 mg by mouth as needed (allergies).   5  . OXYGEN Inhale 2 L into the lungs as needed (LOW OXYGEN).    . pantoprazole (PROTONIX) 40 MG tablet Take 1 tablet (40 mg total) by mouth daily. 30 tablet 0  . polyethylene glycol (MIRALAX) packet Take 17 g by mouth 2 (two) times daily. 14 each 0   No current facility-administered medications for this visit.     REVIEW OF SYSTEMS:   Constitutional: Denies fevers, chills or night sweats Eyes: Denies blurriness of vision Ears, nose, mouth, throat, and face: Denies mucositis or sore throat Respiratory: Denies cough, dyspnea or wheezes Cardiovascular: Denies palpitation, chest discomfort or lower extremity swelling Gastrointestinal:  Denies nausea, heartburn or change in bowel habits Skin: Denies abnormal skin rashes Lymphatics: Denies new lymphadenopathy or easy bruising Neurological:Denies numbness, tingling or new weaknesses Behavioral/Psych: Mood is stable, no new changes  All other systems were reviewed with the patient and are negative.  PHYSICAL EXAMINATION: ECOG PERFORMANCE STATUS: 1 - Symptomatic but completely ambulatory  Vitals:   07/02/20 1303  BP: 140/76  Pulse: 73  Resp: 18  Temp: (!) 97.3 F (36.3 C)  SpO2: 97%   Filed Weights   07/02/20 1303  Weight: 201 lb 9.6 oz (91.4 kg)    GENERAL:alert, no distress and comfortable NEURO: alert & oriented x 3 with fluent speech, no focal motor/sensory deficits  LABORATORY DATA:  I have reviewed the data as  listed     Component Value Date/Time   NA 141 07/02/2020 1220   NA 141 11/10/2013 1449   K 3.2 (L) 07/02/2020 1220   K 3.7 11/10/2013 1449   CL 102 07/02/2020 1220   CO2 33 (H) 07/02/2020 1220   CO2 27 11/10/2013 1449   GLUCOSE 112 (H) 07/02/2020 1220   GLUCOSE 116 11/10/2013 1449   BUN 13 07/02/2020 1220   BUN 13.2 11/10/2013 1449   CREATININE 0.79 07/02/2020 1220   CREATININE 0.8 11/10/2013 1449   CALCIUM 9.3 07/02/2020 1220   CALCIUM 9.1 11/10/2013 1449   PROT 7.2 07/02/2020 1220   PROT 7.1 11/10/2013 1449   ALBUMIN 3.7 07/02/2020 1220   ALBUMIN 3.8 11/10/2013 1449   AST 18 07/02/2020 1220   AST 19 11/10/2013 1449   ALT 14 07/02/2020 1220   ALT 17 11/10/2013 1449   ALKPHOS 61 07/02/2020 1220   ALKPHOS 76 11/10/2013 1449   BILITOT 0.8 07/02/2020 1220   BILITOT 0.74 11/10/2013 1449   GFRNONAA >60 07/02/2020 1220   GFRAA >60 05/06/2020 1441    No results found for: SPEP, UPEP  Lab Results  Component Value Date   WBC 5.9 07/02/2020   NEUTROABS  3.1 07/02/2020   HGB 12.9 07/02/2020   HCT 42.2 07/02/2020   MCV 81.3 07/02/2020   PLT 265 07/02/2020      Chemistry      Component Value Date/Time   NA 141 07/02/2020 1220   NA 141 11/10/2013 1449   K 3.2 (L) 07/02/2020 1220   K 3.7 11/10/2013 1449   CL 102 07/02/2020 1220   CO2 33 (H) 07/02/2020 1220   CO2 27 11/10/2013 1449   BUN 13 07/02/2020 1220   BUN 13.2 11/10/2013 1449   CREATININE 0.79 07/02/2020 1220   CREATININE 0.8 11/10/2013 1449      Component Value Date/Time   CALCIUM 9.3 07/02/2020 1220   CALCIUM 9.1 11/10/2013 1449   ALKPHOS 61 07/02/2020 1220   ALKPHOS 76 11/10/2013 1449   AST 18 07/02/2020 1220   AST 19 11/10/2013 1449   ALT 14 07/02/2020 1220   ALT 17 11/10/2013 1449   BILITOT 0.8 07/02/2020 1220   BILITOT 0.74 11/10/2013 1449

## 2020-07-02 NOTE — Assessment & Plan Note (Signed)
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 Her hemoglobin is completely normal Iron studies are still pending, will call the patient with test results I plan to see her again in 6 months for further follow-up The patient is educated to watch out for signs and symptoms of iron deficiency anemia and will call me if she have symptoms sooner

## 2020-07-02 NOTE — Assessment & Plan Note (Signed)
She has symptoms of excessive fatigue I will check her thyroid function and will call her with test results

## 2020-07-05 ENCOUNTER — Telehealth: Payer: Self-pay

## 2020-07-05 DIAGNOSIS — R5383 Other fatigue: Secondary | ICD-10-CM | POA: Diagnosis not present

## 2020-07-05 DIAGNOSIS — I10 Essential (primary) hypertension: Secondary | ICD-10-CM | POA: Diagnosis not present

## 2020-07-05 DIAGNOSIS — K219 Gastro-esophageal reflux disease without esophagitis: Secondary | ICD-10-CM | POA: Diagnosis not present

## 2020-07-05 DIAGNOSIS — D500 Iron deficiency anemia secondary to blood loss (chronic): Secondary | ICD-10-CM | POA: Diagnosis not present

## 2020-07-05 DIAGNOSIS — E538 Deficiency of other specified B group vitamins: Secondary | ICD-10-CM | POA: Diagnosis not present

## 2020-07-05 NOTE — Telephone Encounter (Signed)
Called and given below message. She verbalized understanding. 

## 2020-07-05 NOTE — Telephone Encounter (Signed)
-----   Message from [name], MD sent at 07/05/2020  8:46 AM EDT ----- Regarding: iron studies and TSH Her iron studies are low TSH is abnormal I recommend seeing her back in 3 months instead of 6, will send new scheduling msg She should touch base with her primary doctor about her thyroid medicine, may need dose adjustment

## 2020-07-07 DIAGNOSIS — E039 Hypothyroidism, unspecified: Secondary | ICD-10-CM | POA: Diagnosis not present

## 2020-07-07 DIAGNOSIS — Z6837 Body mass index (BMI) 37.0-37.9, adult: Secondary | ICD-10-CM | POA: Diagnosis not present

## 2020-07-07 DIAGNOSIS — R5383 Other fatigue: Secondary | ICD-10-CM | POA: Diagnosis not present

## 2020-07-07 DIAGNOSIS — D509 Iron deficiency anemia, unspecified: Secondary | ICD-10-CM | POA: Diagnosis not present

## 2020-08-11 DIAGNOSIS — K219 Gastro-esophageal reflux disease without esophagitis: Secondary | ICD-10-CM | POA: Diagnosis not present

## 2020-08-11 DIAGNOSIS — E538 Deficiency of other specified B group vitamins: Secondary | ICD-10-CM | POA: Diagnosis not present

## 2020-08-11 DIAGNOSIS — D500 Iron deficiency anemia secondary to blood loss (chronic): Secondary | ICD-10-CM | POA: Diagnosis not present

## 2020-08-11 DIAGNOSIS — E039 Hypothyroidism, unspecified: Secondary | ICD-10-CM | POA: Diagnosis not present

## 2020-08-11 DIAGNOSIS — M25512 Pain in left shoulder: Secondary | ICD-10-CM | POA: Diagnosis not present

## 2020-08-11 DIAGNOSIS — I10 Essential (primary) hypertension: Secondary | ICD-10-CM | POA: Diagnosis not present

## 2020-08-16 DIAGNOSIS — E039 Hypothyroidism, unspecified: Secondary | ICD-10-CM | POA: Diagnosis not present

## 2020-08-16 DIAGNOSIS — D500 Iron deficiency anemia secondary to blood loss (chronic): Secondary | ICD-10-CM | POA: Diagnosis not present

## 2020-08-16 DIAGNOSIS — D509 Iron deficiency anemia, unspecified: Secondary | ICD-10-CM | POA: Diagnosis not present

## 2020-08-30 ENCOUNTER — Other Ambulatory Visit: Payer: Self-pay | Admitting: Hematology and Oncology

## 2020-08-31 ENCOUNTER — Other Ambulatory Visit (HOSPITAL_COMMUNITY): Payer: Self-pay | Admitting: *Deleted

## 2020-09-01 ENCOUNTER — Inpatient Hospital Stay (HOSPITAL_COMMUNITY): Admission: RE | Admit: 2020-09-01 | Payer: Medicare Other | Source: Ambulatory Visit

## 2020-09-01 DIAGNOSIS — M25512 Pain in left shoulder: Secondary | ICD-10-CM | POA: Diagnosis not present

## 2020-09-10 ENCOUNTER — Inpatient Hospital Stay: Payer: Medicare Other | Attending: Hematology and Oncology

## 2020-09-10 ENCOUNTER — Other Ambulatory Visit: Payer: Self-pay

## 2020-09-10 ENCOUNTER — Inpatient Hospital Stay (HOSPITAL_BASED_OUTPATIENT_CLINIC_OR_DEPARTMENT_OTHER): Payer: Medicare Other | Admitting: Hematology and Oncology

## 2020-09-10 ENCOUNTER — Encounter: Payer: Self-pay | Admitting: Hematology and Oncology

## 2020-09-10 ENCOUNTER — Inpatient Hospital Stay: Payer: Medicare Other

## 2020-09-10 ENCOUNTER — Other Ambulatory Visit: Payer: Self-pay | Admitting: Hematology and Oncology

## 2020-09-10 DIAGNOSIS — R58 Hemorrhage, not elsewhere classified: Secondary | ICD-10-CM | POA: Diagnosis not present

## 2020-09-10 DIAGNOSIS — I10 Essential (primary) hypertension: Secondary | ICD-10-CM | POA: Diagnosis not present

## 2020-09-10 DIAGNOSIS — D509 Iron deficiency anemia, unspecified: Secondary | ICD-10-CM

## 2020-09-10 DIAGNOSIS — Z85850 Personal history of malignant neoplasm of thyroid: Secondary | ICD-10-CM

## 2020-09-10 DIAGNOSIS — Z79899 Other long term (current) drug therapy: Secondary | ICD-10-CM | POA: Diagnosis not present

## 2020-09-10 DIAGNOSIS — R5383 Other fatigue: Secondary | ICD-10-CM

## 2020-09-10 DIAGNOSIS — D508 Other iron deficiency anemias: Secondary | ICD-10-CM | POA: Insufficient documentation

## 2020-09-10 DIAGNOSIS — K909 Intestinal malabsorption, unspecified: Secondary | ICD-10-CM | POA: Insufficient documentation

## 2020-09-10 LAB — SAMPLE TO BLOOD BANK

## 2020-09-10 LAB — CBC WITH DIFFERENTIAL/PLATELET
Abs Immature Granulocytes: 0.04 10*3/uL (ref 0.00–0.07)
Basophils Absolute: 0.1 10*3/uL (ref 0.0–0.1)
Basophils Relative: 1 %
Eosinophils Absolute: 0.3 10*3/uL (ref 0.0–0.5)
Eosinophils Relative: 3 %
HCT: 38.6 % (ref 36.0–46.0)
Hemoglobin: 12.1 g/dL (ref 12.0–15.0)
Immature Granulocytes: 0 %
Lymphocytes Relative: 29 %
Lymphs Abs: 2.7 10*3/uL (ref 0.7–4.0)
MCH: 25.7 pg — ABNORMAL LOW (ref 26.0–34.0)
MCHC: 31.3 g/dL (ref 30.0–36.0)
MCV: 82.1 fL (ref 80.0–100.0)
Monocytes Absolute: 0.9 10*3/uL (ref 0.1–1.0)
Monocytes Relative: 10 %
Neutro Abs: 5.1 10*3/uL (ref 1.7–7.7)
Neutrophils Relative %: 57 %
Platelets: 303 10*3/uL (ref 150–400)
RBC: 4.7 MIL/uL (ref 3.87–5.11)
RDW: 14.4 % (ref 11.5–15.5)
WBC: 9.1 10*3/uL (ref 4.0–10.5)
nRBC: 0 % (ref 0.0–0.2)

## 2020-09-10 LAB — IRON AND TIBC
Iron: 65 ug/dL (ref 41–142)
Saturation Ratios: 17 % — ABNORMAL LOW (ref 21–57)
TIBC: 391 ug/dL (ref 236–444)
UIBC: 327 ug/dL (ref 120–384)

## 2020-09-10 LAB — FERRITIN: Ferritin: 10 ng/mL — ABNORMAL LOW (ref 11–307)

## 2020-09-10 MED ORDER — SODIUM CHLORIDE 0.9 % IV SOLN
INTRAVENOUS | Status: DC
  Filled 2020-09-10: qty 250

## 2020-09-10 MED ORDER — SODIUM CHLORIDE 0.9 % IV SOLN
510.0000 mg | Freq: Once | INTRAVENOUS | Status: AC
  Administered 2020-09-10: 15:00:00 510 mg via INTRAVENOUS
  Filled 2020-09-10: qty 510

## 2020-09-10 NOTE — Assessment & Plan Note (Signed)
Her blood pressure is intermittently elevated She will continue her prescribed antihypertensives

## 2020-09-10 NOTE — Patient Instructions (Signed)

## 2020-09-10 NOTE — Progress Notes (Signed)
[name] OFFICE PROGRESS NOTE  Velna Hatchet, MD  ASSESSMENT & PLAN:  Iron deficiency anemia Even though she is technically not anemic She has progressive drop in hemoglobin Her last set of iron studies in October show iron deficiency I recommend we proceed with intravenous iron infusion today as scheduled I will call her with results of her iron studies If her iron studies were very low, we can consider a second dose at the end of the month I plan to see her again in 3 months for further follow-up The most likely cause of her anemia is due to chronic blood loss/malabsorption syndrome. We discussed some of the risks, benefits, and alternatives of intravenous iron infusions. The patient is symptomatic from anemia and the iron level is critically low. She tolerated oral iron supplement poorly and desires to achieved higher levels of iron faster for adequate hematopoesis. Some of the side-effects to be expected including risks of infusion reactions, phlebitis, headaches, nausea and fatigue.  The patient is willing to proceed. Patient education material was dispensed.  Goal is to keep ferritin level greater than 100 and resolution of anemia   Essential hypertension Her blood pressure is intermittently elevated She will continue her prescribed antihypertensives   No orders of the defined types were placed in this encounter.   The total time spent in the appointment was 20 minutes encounter with patients including review of chart and various tests results, discussions about plan of care and coordination of care plan   All questions were answered. The patient knows to call the clinic with any problems, questions or concerns. No barriers to learning was detected.    [name], MD 12/17/20212:15 PM  INTERVAL HISTORY: Angelica [name] 79 y.o. female returns for further follow-up on recurrent iron deficiency anemia Since last time I saw her, she feels well The patient  denies any recent signs or symptoms of bleeding such as spontaneous epistaxis, hematuria or hematochezia.. Denies infusion reaction No recent chest pain or shortness of breath The patient did not tolerate COVID-19 vaccination She declined booster vaccine and influenza vaccination today  SUMMARY OF HEMATOLOGIC HISTORY: Angelica [name] is seen again for recurrent iron deficiency anemia She is billed as a new patient today because it has been more than 3 years since she was last seen here She was lost to follow-up She has received blood recently The patient denies any recent signs or symptoms of bleeding such as spontaneous epistaxis, hematuria or hematochezia. She complains of recent weight loss Denies recent nausea, changes in bowel habits or abdominal pain She denies the use of NSAID  I reviewed the patient's records extensive and collaborated the history with the patient. Summary of her history is as follows:  This patient has been anemic for many years. I was able to review her CBC from 2010 and noted that she had progressive microcytic anemia. In 2013, she has severe anemia requiring blood transfusion. She underwent EGD and colonoscopy in 2013 which only showed a large hiatal hernia but no evidence of ulcer or source of bleeding. She had significant pica with excessive chewing of ice. Her last colonoscopy in 2013 was negative She received intravenous iron infusion on 11/26/2013, 04/09/2014 and 08/24/14.  She also received blood transfusion on 04/09/2014 and 08/19/14 for severe anemia. From January 2016 to June 2016, she needed iron infusion on a monthly basis. On 03/05/2015, she received 1 unit of blood. On 03/12/2015, colonoscopy showed no evidence of active bleeding or hemorrhoids.  From 2016 to 2017, she received multiple doses of IV iron Her primary care doctor has prescribed vitamin B-12 injection. She was lost to follow-up after 2017 She received 2 units of blood transfusion in  July 2021 followed by IV iron in August   I have reviewed the past medical history, past surgical history, social history and family history with the patient and they are unchanged from previous note.  ALLERGIES:  is allergic to other, fosamax [alendronate sodium], penicillins, and sulfa antibiotics.  MEDICATIONS:  Current Outpatient Medications  Medication Sig Dispense Refill  . acetaminophen (TYLENOL) 500 MG tablet Take 500 mg by mouth every 6 (six) hours as needed (body ache).    Marland Kitchen albuterol (PROVENTIL HFA;VENTOLIN HFA) 108 (90 Base) MCG/ACT inhaler Inhale into the lungs every 6 (six) hours as needed for wheezing or shortness of breath.    Marland Kitchen amLODipine (NORVASC) 5 MG tablet Take 5 mg by mouth at bedtime.     . fluticasone (FLONASE) 50 MCG/ACT nasal spray   5  . hydrocortisone (ANUSOL-HC) 25 MG suppository Place 1 suppository (25 mg total) rectally 2 (two) times daily as needed for hemorrhoids or anal itching. 12 suppository 0  . levothyroxine (SYNTHROID, LEVOTHROID) 125 MCG tablet Take 125 mcg by mouth daily.    Marland Kitchen loratadine (CLARITIN) 10 MG tablet Take 10 mg by mouth as needed for allergies or rhinitis.     Marland Kitchen montelukast (SINGULAIR) 10 MG tablet Take 10 mg by mouth as needed (allergies).   5  . OXYGEN Inhale 2 L into the lungs as needed (LOW OXYGEN).    . pantoprazole (PROTONIX) 40 MG tablet Take 1 tablet (40 mg total) by mouth daily. 30 tablet 0  . polyethylene glycol (MIRALAX) packet Take 17 g by mouth 2 (two) times daily. 14 each 0   No current facility-administered medications for this visit.     REVIEW OF SYSTEMS:   Constitutional: Denies fevers, chills or night sweats Eyes: Denies blurriness of vision Ears, nose, mouth, throat, and face: Denies mucositis or sore throat Respiratory: Denies cough, dyspnea or wheezes Cardiovascular: Denies palpitation, chest discomfort or lower extremity swelling Gastrointestinal:  Denies nausea, heartburn or change in bowel habits Skin: Denies  abnormal skin rashes Lymphatics: Denies new lymphadenopathy or easy bruising Neurological:Denies numbness, tingling or new weaknesses Behavioral/Psych: Mood is stable, no new changes  All other systems were reviewed with the patient and are negative.  PHYSICAL EXAMINATION: ECOG PERFORMANCE STATUS: 1 - Symptomatic but completely ambulatory  Vitals:   09/10/20 1358  BP: (!) 154/72  Pulse: 71  Resp: 18  Temp: (!) 97.4 F (36.3 C)  SpO2: 96%   Filed Weights   09/10/20 1358  Weight: 206 lb 12.8 oz (93.8 kg)    GENERAL:alert, no distress and comfortable NEURO: alert & oriented x 3 with fluent speech, no focal motor/sensory deficits  LABORATORY DATA:  I have reviewed the data as listed     Component Value Date/Time   NA 141 07/02/2020 1220   NA 141 11/10/2013 1449   K 3.2 (L) 07/02/2020 1220   K 3.7 11/10/2013 1449   CL 102 07/02/2020 1220   CO2 33 (H) 07/02/2020 1220   CO2 27 11/10/2013 1449   GLUCOSE 112 (H) 07/02/2020 1220   GLUCOSE 116 11/10/2013 1449   BUN 13 07/02/2020 1220   BUN 13.2 11/10/2013 1449   CREATININE 0.79 07/02/2020 1220   CREATININE 0.8 11/10/2013 1449   CALCIUM 9.3 07/02/2020 1220   CALCIUM 9.1 11/10/2013 1449  PROT 7.2 07/02/2020 1220   PROT 7.1 11/10/2013 1449   ALBUMIN 3.7 07/02/2020 1220   ALBUMIN 3.8 11/10/2013 1449   AST 18 07/02/2020 1220   AST 19 11/10/2013 1449   ALT 14 07/02/2020 1220   ALT 17 11/10/2013 1449   ALKPHOS 61 07/02/2020 1220   ALKPHOS 76 11/10/2013 1449   BILITOT 0.8 07/02/2020 1220   BILITOT 0.74 11/10/2013 1449   GFRNONAA >60 07/02/2020 1220   GFRAA >60 05/06/2020 1441    No results found for: SPEP, UPEP  Lab Results  Component Value Date   WBC 9.1 09/10/2020   NEUTROABS 5.1 09/10/2020   HGB 12.1 09/10/2020   HCT 38.6 09/10/2020   MCV 82.1 09/10/2020   PLT 303 09/10/2020      Chemistry      Component Value Date/Time   NA 141 07/02/2020 1220   NA 141 11/10/2013 1449   K 3.2 (L) 07/02/2020 1220   K  3.7 11/10/2013 1449   CL 102 07/02/2020 1220   CO2 33 (H) 07/02/2020 1220   CO2 27 11/10/2013 1449   BUN 13 07/02/2020 1220   BUN 13.2 11/10/2013 1449   CREATININE 0.79 07/02/2020 1220   CREATININE 0.8 11/10/2013 1449      Component Value Date/Time   CALCIUM 9.3 07/02/2020 1220   CALCIUM 9.1 11/10/2013 1449   ALKPHOS 61 07/02/2020 1220   ALKPHOS 76 11/10/2013 1449   AST 18 07/02/2020 1220   AST 19 11/10/2013 1449   ALT 14 07/02/2020 1220   ALT 17 11/10/2013 1449   BILITOT 0.8 07/02/2020 1220   BILITOT 0.74 11/10/2013 1449

## 2020-09-10 NOTE — Assessment & Plan Note (Signed)
Even though she is technically not anemic She has progressive drop in hemoglobin Her last set of iron studies in October show iron deficiency I recommend we proceed with intravenous iron infusion today as scheduled I will call her with results of her iron studies If her iron studies were very low, we can consider a second dose at the end of the month I plan to see her again in 3 months for further follow-up The most likely cause of her anemia is due to chronic blood loss/malabsorption syndrome. We discussed some of the risks, benefits, and alternatives of intravenous iron infusions. The patient is symptomatic from anemia and the iron level is critically low. She tolerated oral iron supplement poorly and desires to achieved higher levels of iron faster for adequate hematopoesis. Some of the side-effects to be expected including risks of infusion reactions, phlebitis, headaches, nausea and fatigue.  The patient is willing to proceed. Patient education material was dispensed.  Goal is to keep ferritin level greater than 100 and resolution of anemia

## 2020-09-15 ENCOUNTER — Inpatient Hospital Stay: Payer: Medicare Other

## 2020-09-15 ENCOUNTER — Other Ambulatory Visit: Payer: Self-pay

## 2020-09-15 VITALS — BP 136/69 | HR 70 | Temp 98.0°F | Resp 18

## 2020-09-15 DIAGNOSIS — I10 Essential (primary) hypertension: Secondary | ICD-10-CM | POA: Diagnosis not present

## 2020-09-15 DIAGNOSIS — D508 Other iron deficiency anemias: Secondary | ICD-10-CM | POA: Diagnosis not present

## 2020-09-15 DIAGNOSIS — Z79899 Other long term (current) drug therapy: Secondary | ICD-10-CM | POA: Diagnosis not present

## 2020-09-15 DIAGNOSIS — R58 Hemorrhage, not elsewhere classified: Secondary | ICD-10-CM | POA: Diagnosis not present

## 2020-09-15 DIAGNOSIS — K909 Intestinal malabsorption, unspecified: Secondary | ICD-10-CM | POA: Diagnosis not present

## 2020-09-15 DIAGNOSIS — D509 Iron deficiency anemia, unspecified: Secondary | ICD-10-CM

## 2020-09-15 MED ORDER — SODIUM CHLORIDE 0.9 % IV SOLN
Freq: Once | INTRAVENOUS | Status: AC
  Filled 2020-09-15: qty 250

## 2020-09-15 MED ORDER — SODIUM CHLORIDE 0.9 % IV SOLN
510.0000 mg | Freq: Once | INTRAVENOUS | Status: AC
  Administered 2020-09-15: 15:00:00 510 mg via INTRAVENOUS
  Filled 2020-09-15: qty 510

## 2020-09-15 NOTE — Patient Instructions (Signed)

## 2020-09-15 NOTE — Progress Notes (Signed)
Pt. completed 30 minute post observation and tolerated treatment without any issues. Left via wheelchair, no respiratory distress noted.

## 2020-09-22 ENCOUNTER — Telehealth: Payer: Self-pay | Admitting: Hematology and Oncology

## 2020-09-22 NOTE — Telephone Encounter (Signed)
Release: [id number]  Released OV note to [facility] to [phone]

## 2020-10-22 DIAGNOSIS — E538 Deficiency of other specified B group vitamins: Secondary | ICD-10-CM | POA: Diagnosis not present

## 2020-10-22 DIAGNOSIS — E876 Hypokalemia: Secondary | ICD-10-CM | POA: Diagnosis not present

## 2020-10-22 DIAGNOSIS — E039 Hypothyroidism, unspecified: Secondary | ICD-10-CM | POA: Diagnosis not present

## 2020-10-22 DIAGNOSIS — R946 Abnormal results of thyroid function studies: Secondary | ICD-10-CM | POA: Diagnosis not present

## 2020-10-22 DIAGNOSIS — E1169 Type 2 diabetes mellitus with other specified complication: Secondary | ICD-10-CM | POA: Diagnosis not present

## 2020-10-22 DIAGNOSIS — E119 Type 2 diabetes mellitus without complications: Secondary | ICD-10-CM | POA: Diagnosis not present

## 2020-10-22 DIAGNOSIS — E559 Vitamin D deficiency, unspecified: Secondary | ICD-10-CM | POA: Diagnosis not present

## 2020-10-22 DIAGNOSIS — D649 Anemia, unspecified: Secondary | ICD-10-CM | POA: Diagnosis not present

## 2020-10-22 DIAGNOSIS — D509 Iron deficiency anemia, unspecified: Secondary | ICD-10-CM | POA: Diagnosis not present

## 2020-10-22 DIAGNOSIS — I10 Essential (primary) hypertension: Secondary | ICD-10-CM | POA: Diagnosis not present

## 2020-10-27 DIAGNOSIS — E1169 Type 2 diabetes mellitus with other specified complication: Secondary | ICD-10-CM | POA: Diagnosis not present

## 2020-10-27 DIAGNOSIS — E039 Hypothyroidism, unspecified: Secondary | ICD-10-CM | POA: Diagnosis not present

## 2020-10-27 DIAGNOSIS — F330 Major depressive disorder, recurrent, mild: Secondary | ICD-10-CM | POA: Diagnosis not present

## 2020-10-27 DIAGNOSIS — R197 Diarrhea, unspecified: Secondary | ICD-10-CM | POA: Diagnosis not present

## 2020-10-27 DIAGNOSIS — M810 Age-related osteoporosis without current pathological fracture: Secondary | ICD-10-CM | POA: Diagnosis not present

## 2020-10-27 DIAGNOSIS — J329 Chronic sinusitis, unspecified: Secondary | ICD-10-CM | POA: Diagnosis not present

## 2020-10-27 DIAGNOSIS — E559 Vitamin D deficiency, unspecified: Secondary | ICD-10-CM | POA: Diagnosis not present

## 2020-10-27 DIAGNOSIS — Z0000 Encounter for general adult medical examination without abnormal findings: Secondary | ICD-10-CM | POA: Diagnosis not present

## 2020-10-27 DIAGNOSIS — D509 Iron deficiency anemia, unspecified: Secondary | ICD-10-CM | POA: Diagnosis not present

## 2020-10-27 DIAGNOSIS — I10 Essential (primary) hypertension: Secondary | ICD-10-CM | POA: Diagnosis not present

## 2020-11-23 ENCOUNTER — Other Ambulatory Visit: Payer: Self-pay

## 2020-11-23 ENCOUNTER — Emergency Department (HOSPITAL_COMMUNITY)
Admission: EM | Admit: 2020-11-23 | Discharge: 2020-11-24 | Disposition: A | Discharge status: Home / Self Care | Payer: Medicare Other | Attending: Emergency Medicine | Admitting: Emergency Medicine

## 2020-11-23 ENCOUNTER — Encounter (HOSPITAL_COMMUNITY): Payer: Self-pay | Admitting: Emergency Medicine

## 2020-11-23 DIAGNOSIS — R110 Nausea: Secondary | ICD-10-CM | POA: Diagnosis not present

## 2020-11-23 DIAGNOSIS — I10 Essential (primary) hypertension: Secondary | ICD-10-CM | POA: Diagnosis not present

## 2020-11-23 DIAGNOSIS — R1013 Epigastric pain: Secondary | ICD-10-CM | POA: Diagnosis not present

## 2020-11-23 DIAGNOSIS — K219 Gastro-esophageal reflux disease without esophagitis: Secondary | ICD-10-CM | POA: Diagnosis not present

## 2020-11-23 DIAGNOSIS — R1111 Vomiting without nausea: Secondary | ICD-10-CM | POA: Diagnosis not present

## 2020-11-23 DIAGNOSIS — R197 Diarrhea, unspecified: Secondary | ICD-10-CM | POA: Insufficient documentation

## 2020-11-23 DIAGNOSIS — Z87891 Personal history of nicotine dependence: Secondary | ICD-10-CM | POA: Diagnosis not present

## 2020-11-23 DIAGNOSIS — R1084 Generalized abdominal pain: Secondary | ICD-10-CM | POA: Diagnosis not present

## 2020-11-23 DIAGNOSIS — Z85850 Personal history of malignant neoplasm of thyroid: Secondary | ICD-10-CM | POA: Diagnosis not present

## 2020-11-23 DIAGNOSIS — Z79899 Other long term (current) drug therapy: Secondary | ICD-10-CM | POA: Diagnosis not present

## 2020-11-23 DIAGNOSIS — R112 Nausea with vomiting, unspecified: Secondary | ICD-10-CM | POA: Diagnosis not present

## 2020-11-23 DIAGNOSIS — J45909 Unspecified asthma, uncomplicated: Secondary | ICD-10-CM | POA: Insufficient documentation

## 2020-11-23 DIAGNOSIS — J449 Chronic obstructive pulmonary disease, unspecified: Secondary | ICD-10-CM | POA: Insufficient documentation

## 2020-11-23 LAB — COMPREHENSIVE METABOLIC PANEL
ALT: 15 U/L (ref 0–44)
AST: 18 U/L (ref 15–41)
Albumin: 3.8 g/dL (ref 3.5–5.0)
Alkaline Phosphatase: 55 U/L (ref 38–126)
Anion gap: 12 (ref 5–15)
BUN: 13 mg/dL (ref 8–23)
CO2: 30 mmol/L (ref 22–32)
Calcium: 9.4 mg/dL (ref 8.9–10.3)
Chloride: 99 mmol/L (ref 98–111)
Creatinine, Ser: 0.99 mg/dL (ref 0.44–1.00)
GFR, Estimated: 57 mL/min — ABNORMAL LOW (ref >60)
Glucose, Bld: 197 mg/dL — ABNORMAL HIGH (ref 70–99)
Potassium: 3.5 mmol/L (ref 3.5–5.1)
Sodium: 141 mmol/L (ref 135–145)
Total Bilirubin: 1.1 mg/dL (ref 0.3–1.2)
Total Protein: 7.4 g/dL (ref 6.5–8.1)

## 2020-11-23 LAB — URINALYSIS, ROUTINE W REFLEX MICROSCOPIC
Bilirubin Urine: NEGATIVE
Glucose, UA: NEGATIVE mg/dL
Hgb urine dipstick: NEGATIVE
Ketones, ur: 5 mg/dL — AB
Leukocytes,Ua: NEGATIVE
Nitrite: NEGATIVE
Protein, ur: 100 mg/dL — AB
Specific Gravity, Urine: 1.021 (ref 1.005–1.030)
pH: 5 (ref 5.0–8.0)

## 2020-11-23 LAB — LIPASE, BLOOD: Lipase: 27 U/L (ref 11–51)

## 2020-11-23 LAB — CBC
HCT: 47.6 % — ABNORMAL HIGH (ref 36.0–46.0)
Hemoglobin: 14.9 g/dL (ref 12.0–15.0)
MCH: 27.5 pg (ref 26.0–34.0)
MCHC: 31.3 g/dL (ref 30.0–36.0)
MCV: 88 fL (ref 80.0–100.0)
Platelets: 293 10*3/uL (ref 150–400)
RBC: 5.41 MIL/uL — ABNORMAL HIGH (ref 3.87–5.11)
RDW: 15.5 % (ref 11.5–15.5)
WBC: 12 10*3/uL — ABNORMAL HIGH (ref 4.0–10.5)
nRBC: 0 % (ref 0.0–0.2)

## 2020-11-23 NOTE — ED Notes (Signed)
Sister is leaving to go home. Would like a call when she is ready

## 2020-11-23 NOTE — ED Triage Notes (Signed)
Onset one day ago developed nausea, emesis, and only a little diarrhea per patient. States has a history of hiatal hernia. EMS came to the house gave an IV with medication. States did not much relief. Denies abdominal now states comes and goes.

## 2020-11-24 DIAGNOSIS — R1013 Epigastric pain: Secondary | ICD-10-CM | POA: Diagnosis not present

## 2020-11-24 MED ORDER — ONDANSETRON 4 MG PO TBDP
8.0000 mg | ORAL_TABLET | Freq: Once | ORAL | Status: AC
  Administered 2020-11-24: 8 mg via ORAL
  Filled 2020-11-24: qty 2

## 2020-11-24 MED ORDER — ONDANSETRON 8 MG PO TBDP: 8.0000 mg | ORAL_TABLET | Freq: Three times a day (TID) | ORAL | 0 refills | Status: DC | PRN

## 2020-11-24 NOTE — ED Provider Notes (Signed)
[facility] EMERGENCY DEPARTMENT Provider Note   CSN: [id number] Arrival date & time: 11/23/20  1248     History Chief Complaint  Patient presents with  . Abdominal Pain  . Emesis  . Diarrhea    Angelica [name] is a 80 y.o. female.  The history is provided by the patient.  Abdominal Pain Pain location:  Epigastric Pain quality: not aching   Pain severity:  Moderate Onset quality:  Gradual Duration:  2 days Timing:  Intermittent Progression:  Improving Chronicity:  Recurrent Relieved by:  Nothing Worsened by:  Nothing Associated symptoms: diarrhea and nausea   Associated symptoms: no chest pain, no dysuria and no shortness of breath   Risk factors: being elderly and multiple surgeries   Emesis Associated symptoms: abdominal pain and diarrhea   Diarrhea Associated symptoms: abdominal pain       Patient with history of COPD, hypertension presents with abdominal pain and nausea.  This episode began approximately 2-3 days ago.  She reports the pain is in her upper abdomen.  She reports she will have severe episodes of nausea and dry heaving.  She is also had small amount of nonbloody diarrhea.  No chest pain or shortness of breath.  She reports she has had several of these episodes over the past month.  She thinks it might be her hiatal hernia.  She is now feeling improved while waiting in the ER. She reports EMS did come to her house and gave her IV fluids Past Medical History:  Diagnosis Date  . Anemia   . Arthritis   . Asthma   . Celiac disease 11/17/2013  . COPD (chronic obstructive pulmonary disease) (HCC)    on 2L prn home O2  . Hypertension   . Rectal bleed 10/23/2018  . Thyroid cancer [facility])    remote    Patient Active Problem List   Diagnosis Date Noted  . Preventive measure 05/06/2020  . Rectal bleeding 10/23/2018  . Bilateral primary osteoarthritis of knee 08/08/2016  . Reactive thrombocytosis 08/24/2014  . Celiac disease 11/17/2013  .  Other nonspecific abnormal cardiovascular system function study 09/27/2012  . Pulmonary HTN ([facility]) 09/27/2012  . History of thyroid cancer- s/p total thyroidectomy 2012 07/04/2012  . Other fatigue 07/04/2012  . Iron deficiency anemia 07/04/2012  . COPD (chronic obstructive pulmonary disease)- on nocturnal oxygen at home 07/04/2012  . GERD (gastroesophageal reflux disease) 07/04/2012  . Chronic anemia- symptomatic 07/03/2012  . Hypokalemia 07/03/2012  . Essential hypertension 07/03/2012    Past Surgical History:  Procedure Laterality Date  . ABDOMINAL HYSTERECTOMY    . BREAST EXCISIONAL BIOPSY Bilateral   . CHOLECYSTECTOMY    . COLONOSCOPY  07/05/2012   Procedure: COLONOSCOPY;  Surgeon: Winfield Cunas., MD;  Location: [facility] ENDOSCOPY;  Service: Endoscopy;  Laterality: N/A;  . COLONOSCOPY N/A 03/12/2015   Procedure: COLONOSCOPY;  Surgeon: Laurence Spates, MD;  Location: [facility] ENDOSCOPY;  Service: Endoscopy;  Laterality: N/A;  . ESOPHAGOGASTRODUODENOSCOPY  07/05/2012   Procedure: ESOPHAGOGASTRODUODENOSCOPY (EGD);  Surgeon: Winfield Cunas., MD;  Location: [facility] ENDOSCOPY;  Service: Endoscopy;  Laterality: N/A;  . HOT HEMOSTASIS N/A 03/12/2015   Procedure: HOT HEMOSTASIS (ARGON PLASMA COAGULATION/BICAP);  Surgeon: Laurence Spates, MD;  Location: [facility] ENDOSCOPY;  Service: Endoscopy;  Laterality: N/A;  . TONSILLECTOMY       OB History   No obstetric history on file.     Family History  Problem Relation Age of Onset  . Heart disease Mother   .  Kidney failure Father   . Cancer Sister        thyroid cancer    Social History   Tobacco Use  . Smoking status: Former Smoker    Packs/day: 0.75    Years: 60.00    Pack years: 45.00    Types: Cigarettes    Quit date: 07/03/2008    Years since quitting: 12.4  . Smokeless tobacco: Never Used  Vaping Use  . Vaping Use: Never used  Substance Use Topics  . Alcohol use: No  . Drug use: No    Home Medications Prior to Admission medications    Medication Sig Start Date End Date Taking? Authorizing Provider  acetaminophen (TYLENOL) 500 MG tablet Take 500 mg by mouth every 6 (six) hours as needed (body ache).    [provider]  albuterol (PROVENTIL HFA;VENTOLIN HFA) 108 (90 Base) MCG/ACT inhaler Inhale into the lungs every 6 (six) hours as needed for wheezing or shortness of breath.    [provider]  amLODipine (NORVASC) 5 MG tablet Take 5 mg by mouth at bedtime.     [provider]  fluticasone Asencion Islam) 50 MCG/ACT nasal spray  01/08/18   [provider]  hydrocortisone (ANUSOL-HC) 25 MG suppository Place 1 suppository (25 mg total) rectally 2 (two) times daily as needed for hemorrhoids or anal itching. 10/24/18   Norval Morton, MD  levothyroxine (SYNTHROID, LEVOTHROID) 125 MCG tablet Take 125 mcg by mouth daily.    [provider]  loratadine (CLARITIN) 10 MG tablet Take 10 mg by mouth as needed for allergies or rhinitis.     [provider]  montelukast (SINGULAIR) 10 MG tablet Take 10 mg by mouth as needed (allergies).  01/08/18   [provider]  OXYGEN Inhale 2 L into the lungs as needed (LOW OXYGEN).    [provider]  pantoprazole (PROTONIX) 40 MG tablet Take 1 tablet (40 mg total) by mouth daily. 10/24/18   Norval Morton, MD  polyethylene glycol (MIRALAX) packet Take 17 g by mouth 2 (two) times daily. 10/24/18   Norval Morton, MD    Allergies    Other, Fosamax [alendronate sodium], Penicillins, and Sulfa antibiotics  Review of Systems   Review of Systems  Respiratory: Negative for shortness of breath.   Cardiovascular: Negative for chest pain.  Gastrointestinal: Positive for abdominal pain, diarrhea and nausea.  Genitourinary: Negative for dysuria.  All other systems reviewed and are negative.   Physical Exam Updated Vital Signs BP 106/66   Pulse 72   Temp (!) 97.4 F (36.3 C) (Oral)   Resp (!) 21   Ht 1.6 m (5' 3" )   Wt 88.5 kg    SpO2 97%   BMI 34.54 kg/m   Physical Exam  CONSTITUTIONAL: Elderly, no acute distress HEAD: Normocephalic/atraumatic EYES: EOMI/PERRL, no icterus ENMT: Mucous membranes dry NECK: supple no meningeal signs SPINE/BACK:entire spine nontender CV: S1/S2 noted, no murmurs/rubs/gallops noted LUNGS: Lungs are clear to auscultation bilaterally, no apparent distress ABDOMEN: soft, nontender, no rebound or guarding, bowel sounds noted throughout abdomen, multiple surgical scars noted to abdomen GU:no cva tenderness NEURO: Pt is awake/alert/appropriate, moves all extremitiesx4.  No facial droop.   EXTREMITIES: pulses normal/equal, full ROM SKIN: warm, color normal PSYCH: no abnormalities of mood noted, alert and oriented to situation  ED Results / Procedures / Treatments   Labs (all labs ordered are listed, but only abnormal results are displayed) Labs Reviewed  COMPREHENSIVE METABOLIC PANEL - Abnormal; Notable  for the following components:      Result Value   Glucose, Bld 197 (*)    GFR, Estimated 57 (*)    All other components within normal limits  CBC - Abnormal; Notable for the following components:   WBC 12.0 (*)    RBC 5.41 (*)    HCT 47.6 (*)    All other components within normal limits  URINALYSIS, ROUTINE W REFLEX MICROSCOPIC - Abnormal; Notable for the following components:   Color, Urine AMBER (*)    APPearance CLOUDY (*)    Ketones, ur 5 (*)    Protein, ur 100 (*)    Bacteria, UA MANY (*)    All other components within normal limits  URINE CULTURE  LIPASE, BLOOD    EKG EKG Interpretation  Date/Time:  Tuesday November 23 2020 14:14:19 EST Ventricular Rate:  95 PR Interval:  190 QRS Duration: 96 QT Interval:  308 QTC Calculation: 387 R Axis:   -44 Text Interpretation: Normal sinus rhythm Left axis deviation Incomplete right bundle branch block Septal infarct , age undetermined Possible Lateral infarct , age undetermined Abnormal ECG Confirmed by Ripley Fraise  [phone]) on 11/24/2020 12:16:50 AM   Radiology No results found.  Procedures Procedures   Medications Ordered in ED Medications  ondansetron (ZOFRAN-ODT) disintegrating tablet 8 mg (8 mg Oral Given 11/24/20 0145)    ED Course  I have reviewed the triage vital signs and the nursing notes.  Pertinent labs results that were available during my care of the patient were reviewed by me and considered in my medical decision making (see chart for details).    MDM Rules/Calculators/A&P                          1:27 AM Patient presents with upper abdominal pain with nausea that is now improving.  Patient is in no acute distress but does appear dehydrated No indication for imaging at this time as patient is improving.  She admits to have these episodes intermittently over the past month.  She would likely benefit from an outpatient work-up.  She has already had a cholecystectomy/hysterectomy/appendectomy previously 2:37 AM Patient overall is very well-appearing.  She is taking p.o. fluids. She has no focal abdominal tenderness.  No indication for imaging at this time.  Advise follow-up with outpatient PCP if continues she may need further imaging as an outpatient.  She was prescribed Zofran at her request Final Clinical Impression(s) / ED Diagnoses Final diagnoses:  Epigastric pain    Rx / DC Orders ED Discharge Orders         Ordered    ondansetron (ZOFRAN ODT) 8 MG disintegrating tablet  Every 8 hours PRN        11/24/20 0213           Ripley Fraise, MD 11/24/20 [phone]

## 2020-11-24 NOTE — Discharge Instructions (Addendum)
Many abdominal conditions cannot be diagnosed in one visit, so follow-up evaluations are very important. SEEK IMMEDIATE MEDICAL ATTENTION IF: The pain does not go away or becomes severe, particularly over the next 8-12 hours.  A temperature above 100.73F develops.  Repeated vomiting occurs (multiple episodes).   Blood is being passed in stools or vomit (bright red or black tarry stools).  Return also if you develop chest pain, difficulty breathing, dizziness or fainting, or become confused, poorly responsive, or inconsolable.

## 2020-11-25 LAB — URINE CULTURE

## 2020-12-02 DIAGNOSIS — H04123 Dry eye syndrome of bilateral lacrimal glands: Secondary | ICD-10-CM | POA: Diagnosis not present

## 2020-12-02 DIAGNOSIS — R7309 Other abnormal glucose: Secondary | ICD-10-CM | POA: Diagnosis not present

## 2020-12-02 DIAGNOSIS — D3132 Benign neoplasm of left choroid: Secondary | ICD-10-CM | POA: Diagnosis not present

## 2020-12-02 DIAGNOSIS — H1045 Other chronic allergic conjunctivitis: Secondary | ICD-10-CM | POA: Diagnosis not present

## 2020-12-02 DIAGNOSIS — Z961 Presence of intraocular lens: Secondary | ICD-10-CM | POA: Diagnosis not present

## 2020-12-08 DIAGNOSIS — Z85850 Personal history of malignant neoplasm of thyroid: Secondary | ICD-10-CM | POA: Diagnosis not present

## 2020-12-08 DIAGNOSIS — D509 Iron deficiency anemia, unspecified: Secondary | ICD-10-CM | POA: Diagnosis not present

## 2020-12-08 DIAGNOSIS — R109 Unspecified abdominal pain: Secondary | ICD-10-CM | POA: Diagnosis not present

## 2020-12-08 DIAGNOSIS — K449 Diaphragmatic hernia without obstruction or gangrene: Secondary | ICD-10-CM | POA: Diagnosis not present

## 2020-12-08 DIAGNOSIS — R110 Nausea: Secondary | ICD-10-CM | POA: Diagnosis not present

## 2020-12-08 DIAGNOSIS — K5900 Constipation, unspecified: Secondary | ICD-10-CM | POA: Diagnosis not present

## 2020-12-08 DIAGNOSIS — R634 Abnormal weight loss: Secondary | ICD-10-CM | POA: Diagnosis not present

## 2020-12-09 ENCOUNTER — Inpatient Hospital Stay: Payer: Medicare Other | Attending: Hematology and Oncology | Admitting: Hematology and Oncology

## 2020-12-09 ENCOUNTER — Inpatient Hospital Stay: Payer: Medicare Other

## 2020-12-09 ENCOUNTER — Telehealth: Payer: Self-pay

## 2020-12-09 ENCOUNTER — Other Ambulatory Visit: Payer: Self-pay | Admitting: Physician Assistant

## 2020-12-09 ENCOUNTER — Encounter: Payer: Self-pay | Admitting: Hematology and Oncology

## 2020-12-09 ENCOUNTER — Other Ambulatory Visit: Payer: Self-pay

## 2020-12-09 VITALS — BP 123/63 | HR 63 | Temp 98.3°F | Resp 18 | Ht 63.0 in | Wt 202.6 lb

## 2020-12-09 DIAGNOSIS — Z79899 Other long term (current) drug therapy: Secondary | ICD-10-CM | POA: Insufficient documentation

## 2020-12-09 DIAGNOSIS — R634 Abnormal weight loss: Secondary | ICD-10-CM

## 2020-12-09 DIAGNOSIS — D509 Iron deficiency anemia, unspecified: Secondary | ICD-10-CM

## 2020-12-09 DIAGNOSIS — E039 Hypothyroidism, unspecified: Secondary | ICD-10-CM | POA: Insufficient documentation

## 2020-12-09 DIAGNOSIS — Z9981 Dependence on supplemental oxygen: Secondary | ICD-10-CM | POA: Diagnosis not present

## 2020-12-09 DIAGNOSIS — R946 Abnormal results of thyroid function studies: Secondary | ICD-10-CM | POA: Diagnosis not present

## 2020-12-09 DIAGNOSIS — Z85850 Personal history of malignant neoplasm of thyroid: Secondary | ICD-10-CM

## 2020-12-09 DIAGNOSIS — R109 Unspecified abdominal pain: Secondary | ICD-10-CM

## 2020-12-09 DIAGNOSIS — R110 Nausea: Secondary | ICD-10-CM

## 2020-12-09 DIAGNOSIS — D500 Iron deficiency anemia secondary to blood loss (chronic): Secondary | ICD-10-CM

## 2020-12-09 DIAGNOSIS — R5383 Other fatigue: Secondary | ICD-10-CM

## 2020-12-09 LAB — CBC WITH DIFFERENTIAL/PLATELET
Abs Immature Granulocytes: 0.02 10*3/uL (ref 0.00–0.07)
Basophils Absolute: 0.1 10*3/uL (ref 0.0–0.1)
Basophils Relative: 1 %
Eosinophils Absolute: 0.3 10*3/uL (ref 0.0–0.5)
Eosinophils Relative: 4 %
HCT: 41.9 % (ref 36.0–46.0)
Hemoglobin: 13.7 g/dL (ref 12.0–15.0)
Immature Granulocytes: 0 %
Lymphocytes Relative: 30 %
Lymphs Abs: 2.3 10*3/uL (ref 0.7–4.0)
MCH: 28.2 pg (ref 26.0–34.0)
MCHC: 32.7 g/dL (ref 30.0–36.0)
MCV: 86.4 fL (ref 80.0–100.0)
Monocytes Absolute: 0.9 10*3/uL (ref 0.1–1.0)
Monocytes Relative: 11 %
Neutro Abs: 4.1 10*3/uL (ref 1.7–7.7)
Neutrophils Relative %: 54 %
Platelets: 276 10*3/uL (ref 150–400)
RBC: 4.85 MIL/uL (ref 3.87–5.11)
RDW: 14.6 % (ref 11.5–15.5)
WBC: 7.7 10*3/uL (ref 4.0–10.5)
nRBC: 0 % (ref 0.0–0.2)

## 2020-12-09 LAB — IRON AND TIBC
Iron: 38 ug/dL — ABNORMAL LOW (ref 41–142)
Saturation Ratios: 13 % — ABNORMAL LOW (ref 21–57)
TIBC: 283 ug/dL (ref 236–444)
UIBC: 245 ug/dL (ref 120–384)

## 2020-12-09 LAB — FERRITIN: Ferritin: 38 ng/mL (ref 11–307)

## 2020-12-09 LAB — SAMPLE TO BLOOD BANK

## 2020-12-09 NOTE — Assessment & Plan Note (Signed)
She is extremely nervous about her abnormal TSH I reviewed the test results with the patient With her history of thyroid cancer, the goal would be to get her TSH suppressed I reassured her that her current dose of Synthroid is appropriate

## 2020-12-09 NOTE — Telephone Encounter (Signed)
Attempted to call with no answer regarding today's appts.

## 2020-12-09 NOTE — Telephone Encounter (Signed)
Attempted to call. No voicemail. Iron somewhat low but you do not need iron at this time. Sending scheduling message to be seen in 2 months.

## 2020-12-09 NOTE — Assessment & Plan Note (Signed)
She has chronic iron deficiency anemia She is not symptomatic today She will be undergoing further GI/CT scan evaluation for her hernia I do not recommend IV iron infusion in the setting of normal hemoglobin I plan to see her again in 2 months

## 2020-12-09 NOTE — Progress Notes (Signed)
[place] OFFICE PROGRESS NOTE  Angelica Hatchet, MD  ASSESSMENT & PLAN:  Iron deficiency [name] She has chronic iron deficiency [name] She is not symptomatic today She will be undergoing further GI/CT scan evaluation for her hernia I do not recommend IV iron infusion in the setting of normal hemoglobin I plan to see her again in 2 months  Acquired hypothyroidism She is extremely nervous about her abnormal TSH I reviewed the test results with the patient With her history of thyroid cancer, the goal would be to get her TSH suppressed I reassured her that her current dose of Synthroid is appropriate   Orders Placed This Encounter  Procedures  . Iron and TIBC    Standing Status:   Standing    Number of Occurrences:   3    Standing Expiration Date:   12/09/2021  . Ferritin    Standing Status:   Standing    Number of Occurrences:   3    Standing Expiration Date:   12/09/2021    The total time spent in the appointment was 20 minutes encounter with patients including review of chart and various tests results, discussions about plan of care and coordination of care plan   All questions were answered. The patient knows to call the clinic with any problems, questions or concerns. No barriers to learning was detected.    [name], MD 3/17/20223:33 PM  INTERVAL HISTORY: Angelica [name] 80 y.o. [name] returns for further follow-up on iron deficiency [name] She had recent abdominal pain and is undergoing CT evaluation for her hernia The patient denies any recent signs or symptoms of bleeding such as spontaneous epistaxis, hematuria or hematochezia. She is concerned about her recent abnormal thyroid test and is not able to get definitive answer what it means when the TSH is suppressed  SUMMARY OF HEMATOLOGIC HISTORY: Angelica [name] She is billed as a new patient today because it has been more than 3 years since she was  last seen here She was lost to follow-up She has received blood recently The patient denies any recent signs or symptoms of bleeding such as spontaneous epistaxis, hematuria or hematochezia. She complains of recent weight loss Denies recent nausea, changes in bowel habits or abdominal pain She denies the use of NSAID  I reviewed the patient's records extensive and collaborated the history with the patient. Summary of her history is as follows:  This patient has been anemic for many years. I was able to review her CBC from 2010 and noted that she had progressive microcytic [name]. In 2013, she has severe [name] requiring blood transfusion. She underwent EGD and colonoscopy in 2013 which only showed a large hiatal hernia but no evidence of ulcer or source of bleeding. She had significant pica with excessive chewing of ice. Her last colonoscopy in 2013 was negative She received intravenous iron infusion on 11/26/2013, 04/09/2014 and 08/24/14.  She also received blood transfusion on 04/09/2014 and 08/19/14 for severe [name]. From January 2016 to June 2016, she needed iron infusion on a monthly basis. On 03/05/2015, she received 1 unit of blood. On 03/12/2015, colonoscopy showed no evidence of active bleeding or hemorrhoids. From 2016 to 2017, she received multiple doses of IV iron Her primary care doctor has prescribed vitamin B-12 injection. She was lost to follow-up after 2017 She received 2 units of blood transfusion in July 2021 followed by IV iron in since August 2021  I have reviewed the past medical history, past surgical history, social history and family history with the patient and they are unchanged from previous note.  ALLERGIES:  is allergic to other, fosamax [alendronate sodium], penicillins, and sulfa antibiotics.  MEDICATIONS:  Current Outpatient Medications  Medication Sig Dispense Refill  . acetaminophen (TYLENOL) 500 MG tablet Take 500 mg by mouth every 6 (six) hours as  needed (body ache).    Marland Kitchen albuterol (PROVENTIL HFA;VENTOLIN HFA) 108 (90 Base) MCG/ACT inhaler Inhale into the lungs every 6 (six) hours as needed for wheezing or shortness of breath.    Marland Kitchen amLODipine (NORVASC) 5 MG tablet Take 5 mg by mouth at bedtime.     . fluticasone (FLONASE) 50 MCG/ACT nasal spray   5  . hydrocortisone (ANUSOL-HC) 25 MG suppository Place 1 suppository (25 mg total) rectally 2 (two) times daily as needed for hemorrhoids or anal itching. 12 suppository 0  . levothyroxine (SYNTHROID, LEVOTHROID) 125 MCG tablet Take 125 mcg by mouth daily.    Marland Kitchen loratadine (CLARITIN) 10 MG tablet Take 10 mg by mouth as needed for allergies or rhinitis.     Marland Kitchen montelukast (SINGULAIR) 10 MG tablet Take 10 mg by mouth as needed (allergies).   5  . ondansetron (ZOFRAN ODT) 8 MG disintegrating tablet Take 1 tablet (8 mg total) by mouth every 8 (eight) hours as needed. 8 tablet 0  . OXYGEN Inhale 2 L into the lungs as needed (LOW OXYGEN).    . pantoprazole (PROTONIX) 40 MG tablet Take 1 tablet (40 mg total) by mouth daily. 30 tablet 0  . polyethylene glycol (MIRALAX) packet Take 17 g by mouth 2 (two) times daily. 14 each 0   No current facility-administered medications for this visit.     REVIEW OF SYSTEMS:   Constitutional: Denies fevers, chills or night sweats Eyes: Denies blurriness of vision Ears, nose, mouth, throat, and face: Denies mucositis or sore throat Respiratory: Denies cough, dyspnea or wheezes Cardiovascular: Denies palpitation, chest discomfort or lower extremity swelling Gastrointestinal:  Denies nausea, heartburn or change in bowel habits Skin: Denies abnormal skin rashes Lymphatics: Denies new lymphadenopathy or easy bruising Neurological:Denies numbness, tingling or new weaknesses Behavioral/Psych: Mood is stable, no new changes  All other systems were reviewed with the patient and are negative.  PHYSICAL EXAMINATION: ECOG PERFORMANCE STATUS: 1 - Symptomatic but completely  ambulatory  Vitals:   12/09/20 1246  BP: 123/63  Pulse: 63  Resp: 18  Temp: 98.3 F (36.8 C)  SpO2: 96%   Filed Weights   12/09/20 1246  Weight: 202 lb 9.6 oz (91.9 kg)    GENERAL:alert, no distress and comfortable NEURO: alert & oriented x 3 with fluent speech, no focal motor/sensory deficits  LABORATORY DATA:  I have reviewed the data as listed     Component Value Date/Time   NA 141 11/23/2020 1413   NA 141 11/10/2013 1449   K 3.5 11/23/2020 1413   K 3.7 11/10/2013 1449   CL 99 11/23/2020 1413   CO2 30 11/23/2020 1413   CO2 27 11/10/2013 1449   GLUCOSE 197 (H) 11/23/2020 1413   GLUCOSE 116 11/10/2013 1449   BUN 13 11/23/2020 1413   BUN 13.2 11/10/2013 1449   CREATININE 0.99 11/23/2020 1413   CREATININE 0.8 11/10/2013 1449   CALCIUM 9.4 11/23/2020 1413   CALCIUM 9.1 11/10/2013 1449   PROT 7.4 11/23/2020 1413   PROT 7.1 11/10/2013 1449   ALBUMIN 3.8 11/23/2020 1413   ALBUMIN 3.8 11/10/2013  1449   AST 18 11/23/2020 1413   AST 19 11/10/2013 1449   ALT 15 11/23/2020 1413   ALT 17 11/10/2013 1449   ALKPHOS 55 11/23/2020 1413   ALKPHOS 76 11/10/2013 1449   BILITOT 1.1 11/23/2020 1413   BILITOT 0.74 11/10/2013 1449   GFRNONAA 57 (L) 11/23/2020 1413   GFRAA >60 05/06/2020 1441    No results found for: SPEP, UPEP  Lab Results  Component Value Date   WBC 7.7 12/09/2020   NEUTROABS 4.1 12/09/2020   HGB 13.7 12/09/2020   HCT 41.9 12/09/2020   MCV 86.4 12/09/2020   PLT 276 12/09/2020      Chemistry      Component Value Date/Time   NA 141 11/23/2020 1413   NA 141 11/10/2013 1449   K 3.5 11/23/2020 1413   K 3.7 11/10/2013 1449   CL 99 11/23/2020 1413   CO2 30 11/23/2020 1413   CO2 27 11/10/2013 1449   BUN 13 11/23/2020 1413   BUN 13.2 11/10/2013 1449   CREATININE 0.99 11/23/2020 1413   CREATININE 0.8 11/10/2013 1449      Component Value Date/Time   CALCIUM 9.4 11/23/2020 1413   CALCIUM 9.1 11/10/2013 1449   ALKPHOS 55 11/23/2020 1413   ALKPHOS  76 11/10/2013 1449   AST 18 11/23/2020 1413   AST 19 11/10/2013 1449   ALT 15 11/23/2020 1413   ALT 17 11/10/2013 1449   BILITOT 1.1 11/23/2020 1413   BILITOT 0.74 11/10/2013 1449

## 2020-12-10 ENCOUNTER — Telehealth: Payer: Self-pay | Admitting: Hematology and Oncology

## 2020-12-10 NOTE — Telephone Encounter (Signed)
Called and given below message. She verbalized understanding. 

## 2020-12-10 NOTE — Telephone Encounter (Signed)
Scheduled appt per 3/17 sch msg - pt is aware of appt date and time

## 2020-12-22 ENCOUNTER — Inpatient Hospital Stay: Admission: RE | Admit: 2020-12-22 | Payer: Medicare Other | Source: Ambulatory Visit

## 2020-12-29 DIAGNOSIS — K449 Diaphragmatic hernia without obstruction or gangrene: Secondary | ICD-10-CM | POA: Diagnosis not present

## 2021-01-05 DIAGNOSIS — D509 Iron deficiency anemia, unspecified: Secondary | ICD-10-CM | POA: Diagnosis not present

## 2021-01-05 DIAGNOSIS — K449 Diaphragmatic hernia without obstruction or gangrene: Secondary | ICD-10-CM | POA: Diagnosis not present

## 2021-01-05 DIAGNOSIS — K5900 Constipation, unspecified: Secondary | ICD-10-CM | POA: Diagnosis not present

## 2021-01-06 ENCOUNTER — Ambulatory Visit
Admission: RE | Admit: 2021-01-06 | Discharge: 2021-01-06 | Disposition: A | Discharge status: Home / Self Care | Payer: Medicare Other | Source: Ambulatory Visit | Attending: Physician Assistant | Admitting: Physician Assistant

## 2021-01-06 DIAGNOSIS — R197 Diarrhea, unspecified: Secondary | ICD-10-CM | POA: Diagnosis not present

## 2021-01-06 DIAGNOSIS — R110 Nausea: Secondary | ICD-10-CM | POA: Diagnosis not present

## 2021-01-06 DIAGNOSIS — R634 Abnormal weight loss: Secondary | ICD-10-CM

## 2021-01-06 DIAGNOSIS — R109 Unspecified abdominal pain: Secondary | ICD-10-CM

## 2021-01-06 DIAGNOSIS — D500 Iron deficiency anemia secondary to blood loss (chronic): Secondary | ICD-10-CM

## 2021-01-06 DIAGNOSIS — K449 Diaphragmatic hernia without obstruction or gangrene: Secondary | ICD-10-CM | POA: Diagnosis not present

## 2021-01-06 MED ORDER — IOPAMIDOL (ISOVUE-300) INJECTION 61%
100.0000 mL | Freq: Once | INTRAVENOUS | Status: AC | PRN
  Administered 2021-01-06: 100 mL via INTRAVENOUS

## 2021-01-11 ENCOUNTER — Other Ambulatory Visit: Payer: Self-pay | Admitting: Gastroenterology

## 2021-01-26 ENCOUNTER — Inpatient Hospital Stay (HOSPITAL_COMMUNITY)
Admission: EM | Admit: 2021-01-26 | Discharge: 2021-02-08 | DRG: 329 | Disposition: A | Discharge status: Home Health | Payer: Medicare Other | Attending: Internal Medicine | Admitting: Internal Medicine

## 2021-01-26 ENCOUNTER — Encounter (HOSPITAL_COMMUNITY): Payer: Self-pay

## 2021-01-26 ENCOUNTER — Emergency Department (HOSPITAL_COMMUNITY): Payer: Medicare Other

## 2021-01-26 ENCOUNTER — Other Ambulatory Visit: Payer: Self-pay

## 2021-01-26 DIAGNOSIS — Z85038 Personal history of other malignant neoplasm of large intestine: Secondary | ICD-10-CM | POA: Diagnosis not present

## 2021-01-26 DIAGNOSIS — K56691 Other complete intestinal obstruction: Secondary | ICD-10-CM | POA: Diagnosis present

## 2021-01-26 DIAGNOSIS — Z87891 Personal history of nicotine dependence: Secondary | ICD-10-CM | POA: Diagnosis not present

## 2021-01-26 DIAGNOSIS — R109 Unspecified abdominal pain: Secondary | ICD-10-CM | POA: Diagnosis not present

## 2021-01-26 DIAGNOSIS — R739 Hyperglycemia, unspecified: Secondary | ICD-10-CM | POA: Diagnosis not present

## 2021-01-26 DIAGNOSIS — Z4682 Encounter for fitting and adjustment of non-vascular catheter: Secondary | ICD-10-CM | POA: Diagnosis not present

## 2021-01-26 DIAGNOSIS — I959 Hypotension, unspecified: Secondary | ICD-10-CM | POA: Diagnosis present

## 2021-01-26 DIAGNOSIS — Z808 Family history of malignant neoplasm of other organs or systems: Secondary | ICD-10-CM

## 2021-01-26 DIAGNOSIS — Z8249 Family history of ischemic heart disease and other diseases of the circulatory system: Secondary | ICD-10-CM

## 2021-01-26 DIAGNOSIS — Z882 Allergy status to sulfonamides status: Secondary | ICD-10-CM | POA: Diagnosis not present

## 2021-01-26 DIAGNOSIS — R935 Abnormal findings on diagnostic imaging of other abdominal regions, including retroperitoneum: Secondary | ICD-10-CM | POA: Diagnosis not present

## 2021-01-26 DIAGNOSIS — E876 Hypokalemia: Secondary | ICD-10-CM | POA: Diagnosis present

## 2021-01-26 DIAGNOSIS — E43 Unspecified severe protein-calorie malnutrition: Secondary | ICD-10-CM | POA: Diagnosis not present

## 2021-01-26 DIAGNOSIS — E890 Postprocedural hypothyroidism: Secondary | ICD-10-CM | POA: Diagnosis present

## 2021-01-26 DIAGNOSIS — Z20822 Contact with and (suspected) exposure to covid-19: Secondary | ICD-10-CM | POA: Diagnosis present

## 2021-01-26 DIAGNOSIS — N200 Calculus of kidney: Secondary | ICD-10-CM | POA: Diagnosis not present

## 2021-01-26 DIAGNOSIS — K567 Ileus, unspecified: Secondary | ICD-10-CM | POA: Diagnosis not present

## 2021-01-26 DIAGNOSIS — D509 Iron deficiency anemia, unspecified: Secondary | ICD-10-CM | POA: Diagnosis present

## 2021-01-26 DIAGNOSIS — J449 Chronic obstructive pulmonary disease, unspecified: Secondary | ICD-10-CM | POA: Diagnosis present

## 2021-01-26 DIAGNOSIS — E860 Dehydration: Secondary | ICD-10-CM | POA: Diagnosis present

## 2021-01-26 DIAGNOSIS — K5730 Diverticulosis of large intestine without perforation or abscess without bleeding: Secondary | ICD-10-CM | POA: Diagnosis present

## 2021-01-26 DIAGNOSIS — I9589 Other hypotension: Secondary | ICD-10-CM | POA: Diagnosis not present

## 2021-01-26 DIAGNOSIS — I10 Essential (primary) hypertension: Secondary | ICD-10-CM | POA: Diagnosis present

## 2021-01-26 DIAGNOSIS — I272 Pulmonary hypertension, unspecified: Secondary | ICD-10-CM | POA: Diagnosis present

## 2021-01-26 DIAGNOSIS — Z79899 Other long term (current) drug therapy: Secondary | ICD-10-CM

## 2021-01-26 DIAGNOSIS — Z85850 Personal history of malignant neoplasm of thyroid: Secondary | ICD-10-CM | POA: Diagnosis not present

## 2021-01-26 DIAGNOSIS — K449 Diaphragmatic hernia without obstruction or gangrene: Secondary | ICD-10-CM | POA: Diagnosis present

## 2021-01-26 DIAGNOSIS — K429 Umbilical hernia without obstruction or gangrene: Secondary | ICD-10-CM | POA: Diagnosis not present

## 2021-01-26 DIAGNOSIS — C182 Malignant neoplasm of ascending colon: Secondary | ICD-10-CM | POA: Diagnosis present

## 2021-01-26 DIAGNOSIS — Z79890 Hormone replacement therapy (postmenopausal): Secondary | ICD-10-CM

## 2021-01-26 DIAGNOSIS — D500 Iron deficiency anemia secondary to blood loss (chronic): Secondary | ICD-10-CM

## 2021-01-26 DIAGNOSIS — K56609 Unspecified intestinal obstruction, unspecified as to partial versus complete obstruction: Secondary | ICD-10-CM | POA: Diagnosis present

## 2021-01-26 DIAGNOSIS — C772 Secondary and unspecified malignant neoplasm of intra-abdominal lymph nodes: Secondary | ICD-10-CM | POA: Diagnosis present

## 2021-01-26 DIAGNOSIS — Z888 Allergy status to other drugs, medicaments and biological substances status: Secondary | ICD-10-CM | POA: Diagnosis not present

## 2021-01-26 DIAGNOSIS — K900 Celiac disease: Secondary | ICD-10-CM | POA: Diagnosis present

## 2021-01-26 DIAGNOSIS — Z4659 Encounter for fitting and adjustment of other gastrointestinal appliance and device: Secondary | ICD-10-CM

## 2021-01-26 DIAGNOSIS — Z880 Allergy status to penicillin: Secondary | ICD-10-CM | POA: Diagnosis not present

## 2021-01-26 DIAGNOSIS — Z90710 Acquired absence of both cervix and uterus: Secondary | ICD-10-CM | POA: Diagnosis not present

## 2021-01-26 DIAGNOSIS — E039 Hypothyroidism, unspecified: Secondary | ICD-10-CM | POA: Diagnosis present

## 2021-01-26 DIAGNOSIS — E669 Obesity, unspecified: Secondary | ICD-10-CM | POA: Diagnosis present

## 2021-01-26 DIAGNOSIS — Z885 Allergy status to narcotic agent status: Secondary | ICD-10-CM

## 2021-01-26 DIAGNOSIS — D490 Neoplasm of unspecified behavior of digestive system: Secondary | ICD-10-CM | POA: Diagnosis not present

## 2021-01-26 DIAGNOSIS — Z9981 Dependence on supplemental oxygen: Secondary | ICD-10-CM | POA: Diagnosis not present

## 2021-01-26 DIAGNOSIS — C189 Malignant neoplasm of colon, unspecified: Secondary | ICD-10-CM

## 2021-01-26 DIAGNOSIS — E872 Acidosis: Secondary | ICD-10-CM | POA: Diagnosis present

## 2021-01-26 DIAGNOSIS — D649 Anemia, unspecified: Secondary | ICD-10-CM | POA: Diagnosis present

## 2021-01-26 DIAGNOSIS — K219 Gastro-esophageal reflux disease without esophagitis: Secondary | ICD-10-CM | POA: Diagnosis not present

## 2021-01-26 DIAGNOSIS — K5669 Other partial intestinal obstruction: Secondary | ICD-10-CM | POA: Diagnosis not present

## 2021-01-26 DIAGNOSIS — D72829 Elevated white blood cell count, unspecified: Secondary | ICD-10-CM | POA: Diagnosis present

## 2021-01-26 DIAGNOSIS — R1110 Vomiting, unspecified: Secondary | ICD-10-CM | POA: Diagnosis not present

## 2021-01-26 DIAGNOSIS — E861 Hypovolemia: Secondary | ICD-10-CM | POA: Diagnosis not present

## 2021-01-26 DIAGNOSIS — C19 Malignant neoplasm of rectosigmoid junction: Secondary | ICD-10-CM | POA: Diagnosis not present

## 2021-01-26 DIAGNOSIS — K56699 Other intestinal obstruction unspecified as to partial versus complete obstruction: Secondary | ICD-10-CM | POA: Diagnosis not present

## 2021-01-26 DIAGNOSIS — Z6834 Body mass index (BMI) 34.0-34.9, adult: Secondary | ICD-10-CM

## 2021-01-26 DIAGNOSIS — R634 Abnormal weight loss: Secondary | ICD-10-CM | POA: Diagnosis not present

## 2021-01-26 DIAGNOSIS — J410 Simple chronic bronchitis: Secondary | ICD-10-CM | POA: Diagnosis not present

## 2021-01-26 LAB — URINALYSIS, ROUTINE W REFLEX MICROSCOPIC
Bilirubin Urine: NEGATIVE
Glucose, UA: NEGATIVE mg/dL
Hgb urine dipstick: NEGATIVE
Ketones, ur: 5 mg/dL — AB
Leukocytes,Ua: NEGATIVE
Nitrite: NEGATIVE
Protein, ur: 100 mg/dL — AB
Specific Gravity, Urine: 1.019 (ref 1.005–1.030)
pH: 5 (ref 5.0–8.0)

## 2021-01-26 LAB — CBC WITH DIFFERENTIAL/PLATELET
Abs Immature Granulocytes: 0.08 10*3/uL — ABNORMAL HIGH (ref 0.00–0.07)
Basophils Absolute: 0 10*3/uL (ref 0.0–0.1)
Basophils Relative: 0 %
Eosinophils Absolute: 0 10*3/uL (ref 0.0–0.5)
Eosinophils Relative: 0 %
HCT: 50.2 % — ABNORMAL HIGH (ref 36.0–46.0)
Hemoglobin: 16.3 g/dL — ABNORMAL HIGH (ref 12.0–15.0)
Immature Granulocytes: 1 %
Lymphocytes Relative: 7 %
Lymphs Abs: 1.1 10*3/uL (ref 0.7–4.0)
MCH: 28 pg (ref 26.0–34.0)
MCHC: 32.5 g/dL (ref 30.0–36.0)
MCV: 86.1 fL (ref 80.0–100.0)
Monocytes Absolute: 0.9 10*3/uL (ref 0.1–1.0)
Monocytes Relative: 6 %
Neutro Abs: 12.5 10*3/uL — ABNORMAL HIGH (ref 1.7–7.7)
Neutrophils Relative %: 86 %
Platelets: 353 10*3/uL (ref 150–400)
RBC: 5.83 MIL/uL — ABNORMAL HIGH (ref 3.87–5.11)
RDW: 14.2 % (ref 11.5–15.5)
WBC: 14.7 10*3/uL — ABNORMAL HIGH (ref 4.0–10.5)
nRBC: 0 % (ref 0.0–0.2)

## 2021-01-26 LAB — COMPREHENSIVE METABOLIC PANEL
ALT: 29 U/L (ref 0–44)
AST: 37 U/L (ref 15–41)
Albumin: 4.6 g/dL (ref 3.5–5.0)
Alkaline Phosphatase: 59 U/L (ref 38–126)
Anion gap: 16 — ABNORMAL HIGH (ref 5–15)
BUN: 26 mg/dL — ABNORMAL HIGH (ref 8–23)
CO2: 23 mmol/L (ref 22–32)
Calcium: 10.1 mg/dL (ref 8.9–10.3)
Chloride: 101 mmol/L (ref 98–111)
Creatinine, Ser: 1.59 mg/dL — ABNORMAL HIGH (ref 0.44–1.00)
GFR, Estimated: 32 mL/min — ABNORMAL LOW (ref >60)
Glucose, Bld: 207 mg/dL — ABNORMAL HIGH (ref 70–99)
Potassium: 3.3 mmol/L — ABNORMAL LOW (ref 3.5–5.1)
Sodium: 140 mmol/L (ref 135–145)
Total Bilirubin: 1.4 mg/dL — ABNORMAL HIGH (ref 0.3–1.2)
Total Protein: 8.5 g/dL — ABNORMAL HIGH (ref 6.5–8.1)

## 2021-01-26 LAB — LACTIC ACID, PLASMA
Lactic Acid, Venous: 4.1 mmol/L (ref 0.5–1.9)
Lactic Acid, Venous: 2.6 mmol/L (ref 0.5–1.9)

## 2021-01-26 LAB — LIPASE, BLOOD: Lipase: 35 U/L (ref 11–51)

## 2021-01-26 MED ORDER — LACTATED RINGERS IV BOLUS
1000.0000 mL | Freq: Once | INTRAVENOUS | Status: AC
  Administered 2021-01-26: 1000 mL via INTRAVENOUS

## 2021-01-26 MED ORDER — INSULIN ASPART 100 UNIT/ML IJ SOLN
0.0000 [IU] | Freq: Three times a day (TID) | INTRAMUSCULAR | Status: DC
  Filled 2021-01-26: qty 0.09

## 2021-01-26 MED ORDER — ONDANSETRON HCL 4 MG/2ML IJ SOLN
4.0000 mg | Freq: Once | INTRAMUSCULAR | Status: AC
  Administered 2021-01-26: 4 mg via INTRAVENOUS
  Filled 2021-01-26: qty 2

## 2021-01-26 MED ORDER — SODIUM CHLORIDE 0.9 % IV BOLUS
1000.0000 mL | Freq: Once | INTRAVENOUS | Status: AC
  Administered 2021-01-26: 1000 mL via INTRAVENOUS

## 2021-01-26 MED ORDER — SODIUM CHLORIDE 0.9 % IV SOLN: INTRAVENOUS | Status: DC

## 2021-01-26 NOTE — ED Provider Notes (Signed)
Emergency Medicine Provider Triage Evaluation Note  Angelica [name] , a 80 y.o. female  was evaluated in triage.  Pt complains of abdominal pain and vomiting.  Patient reports she has been persistently vomiting since 7 AM, unable to keep anything down.  Has not been able to have a bowel movement or pass gas.  Reports some generalized abdominal pain.  Was told recently she had a hernia.  Chart review shows large hiatal hernia, but also with new narrowing and bowel wall thickening in the small bowel and terminal ileum.  Review of Systems  Positive: Abdominal pain, vomiting Negative: Fever, diarrhea  Physical Exam  BP (!) 88/73 (BP Location: Left Arm)   Pulse (!) 121   Temp 98.4 F (36.9 C) (Oral)   Resp 17   Ht 5' 3"  (1.6 m)   Wt 81.6 kg   SpO2 92%   BMI 31.89 kg/m  Gen:   Awake, ill-appearing, actively vomiting and in some distress Resp:  Normal effort  MSK:   Moves extremities without difficulty    Medical Decision Making  Medically screening exam initiated at 6:43 PM.  Appropriate orders placed.  Angelica [name] was informed that the remainder of the evaluation will be completed by another provider, this initial triage assessment does not replace that evaluation, and the importance of remaining in the ED until their evaluation is complete.  Patient recently had CT with thickening in the small bowel concerning for potential malignancy versus colitis, patient now having persistent vomiting, not passing gas or bowels, concern for potential obstruction.  Patient acutely ill, hypotensive, tachycardic, will be moved directly to an acute bed in the back for emergent evaluation.   Jacqlyn Larsen, PA-C 01/26/21 1847    Wyvonnia Dusky, MD 01/27/21 1146

## 2021-01-26 NOTE — ED Notes (Signed)
Assisted patient to restroom via wheelchair

## 2021-01-26 NOTE — ED Provider Notes (Signed)
[facility] DEPT Provider Note   CSN: [id number] Arrival date & time: 01/26/21  1830     History Chief Complaint  Patient presents with  . Emesis  . Abdominal Pain    Angelica [name] is a 80 y.o. female presenting for evaluation of nausea and vomiting that got worse this morning.  She is unable to keep anything down.  She states her last bowel movement was yesterday, however she has not passed any gas today.  She had CT abdomen pelvis and April on the 14th which showed abnormal thickening and luminal narrowing involving the cecum and ascending colon, contiguous thickening in the terminal ileum, concerning for IBD versus colitis versus carcinoma.  She states she is followed up outpatient for this.  She has colonoscopy scheduled for the end of June.  Her symptoms have been waxing and waning since April.  Worsened today.  No fevers.  She has some central abdominal pain.  The history is provided by the patient and medical records.       Past Medical History:  Diagnosis Date  . Anemia   . Arthritis   . Asthma   . Celiac disease 11/17/2013  . COPD (chronic obstructive pulmonary disease) (HCC)    on 2L prn home O2  . Hypertension   . Rectal bleed 10/23/2018  . Thyroid cancer [facility])    remote    Patient Active Problem List   Diagnosis Date Noted  . Large bowel obstruction ([place]) 01/26/2021  . Acquired hypothyroidism 12/09/2020  . Preventive measure 05/06/2020  . Rectal bleeding 10/23/2018  . Bilateral primary osteoarthritis of knee 08/08/2016  . Reactive thrombocytosis 08/24/2014  . Celiac disease 11/17/2013  . Other nonspecific abnormal cardiovascular system function study 09/27/2012  . Pulmonary HTN ([place]) 09/27/2012  . History of thyroid cancer- s/p total thyroidectomy 2012 07/04/2012  . Other fatigue 07/04/2012  . Iron deficiency anemia 07/04/2012  . COPD (chronic obstructive pulmonary disease)- on nocturnal oxygen at home 07/04/2012  . GERD  (gastroesophageal reflux disease) 07/04/2012  . Chronic anemia- symptomatic 07/03/2012  . Hypokalemia 07/03/2012  . Essential hypertension 07/03/2012    Past Surgical History:  Procedure Laterality Date  . ABDOMINAL HYSTERECTOMY    . BREAST EXCISIONAL BIOPSY Bilateral   . CHOLECYSTECTOMY    . COLONOSCOPY  07/05/2012   Procedure: COLONOSCOPY;  Surgeon: Winfield Cunas., MD;  Location: [facility] ENDOSCOPY;  Service: Endoscopy;  Laterality: N/A;  . COLONOSCOPY N/A 03/12/2015   Procedure: COLONOSCOPY;  Surgeon: Laurence Spates, MD;  Location: [facility] ENDOSCOPY;  Service: Endoscopy;  Laterality: N/A;  . ESOPHAGOGASTRODUODENOSCOPY  07/05/2012   Procedure: ESOPHAGOGASTRODUODENOSCOPY (EGD);  Surgeon: Winfield Cunas., MD;  Location: [facility] ENDOSCOPY;  Service: Endoscopy;  Laterality: N/A;  . HOT HEMOSTASIS N/A 03/12/2015   Procedure: HOT HEMOSTASIS (ARGON PLASMA COAGULATION/BICAP);  Surgeon: Laurence Spates, MD;  Location: [facility] ENDOSCOPY;  Service: Endoscopy;  Laterality: N/A;  . TONSILLECTOMY       OB History   No obstetric history on file.     Family History  Problem Relation Age of Onset  . Heart disease Mother   . Kidney failure Father   . Cancer Sister        thyroid cancer    Social History   Tobacco Use  . Smoking status: Former Smoker    Packs/day: 0.75    Years: 60.00    Pack years: 45.00    Types: Cigarettes    Quit date: 07/03/2008    Years since  quitting: 12.5  . Smokeless tobacco: Never Used  Vaping Use  . Vaping Use: Never used  Substance Use Topics  . Alcohol use: No  . Drug use: No    Home Medications Prior to Admission medications   Medication Sig Start Date End Date Taking? Authorizing Provider  acetaminophen (TYLENOL) 500 MG tablet Take 500 mg by mouth every 6 (six) hours as needed (body ache).    [provider]  albuterol (PROVENTIL HFA;VENTOLIN HFA) 108 (90 Base) MCG/ACT inhaler Inhale into the lungs every 6 (six) hours as needed for wheezing or shortness  of breath.    [provider]  amLODipine (NORVASC) 5 MG tablet Take 5 mg by mouth at bedtime.     [provider]  fluticasone Asencion Islam) 50 MCG/ACT nasal spray  01/08/18   [provider]  hydrocortisone (ANUSOL-HC) 25 MG suppository Place 1 suppository (25 mg total) rectally 2 (two) times daily as needed for hemorrhoids or anal itching. 10/24/18   Norval Morton, MD  levothyroxine (SYNTHROID, LEVOTHROID) 125 MCG tablet Take 125 mcg by mouth daily.    [provider]  loratadine (CLARITIN) 10 MG tablet Take 10 mg by mouth as needed for allergies or rhinitis.     [provider]  montelukast (SINGULAIR) 10 MG tablet Take 10 mg by mouth as needed (allergies).  01/08/18   [provider]  ondansetron (ZOFRAN ODT) 8 MG disintegrating tablet Take 1 tablet (8 mg total) by mouth every 8 (eight) hours as needed. 11/24/20   Ripley Fraise, MD  OXYGEN Inhale 2 L into the lungs as needed (LOW OXYGEN).    [provider]  pantoprazole (PROTONIX) 40 MG tablet Take 1 tablet (40 mg total) by mouth daily. 10/24/18   Norval Morton, MD  polyethylene glycol (MIRALAX) packet Take 17 g by mouth 2 (two) times daily. 10/24/18   Norval Morton, MD    Allergies    Other, Fosamax [alendronate sodium], Penicillins, and Sulfa antibiotics  Review of Systems   Review of Systems  All other systems reviewed and are negative.   Physical Exam Updated Vital Signs BP 120/90   Pulse (!) 106   Temp 99.1 F (37.3 C)   Resp 20   Ht 5' 3"  (1.6 m)   Wt 81.6 kg   SpO2 94%   BMI 31.89 kg/m   Physical Exam Vitals and nursing note reviewed.  Constitutional:      Appearance: She is well-developed. She is ill-appearing.  HENT:     Head: Normocephalic and atraumatic.  Eyes:     Conjunctiva/sclera: Conjunctivae normal.  Cardiovascular:     Rate and Rhythm: Normal rate and regular rhythm.  Pulmonary:     Effort: Pulmonary effort is normal. No respiratory  distress.     Breath sounds: Normal breath sounds.  Abdominal:     General: Bowel sounds are decreased.     Palpations: Abdomen is soft.     Tenderness: There is abdominal tenderness in the epigastric area and periumbilical area.  Skin:    General: Skin is warm.  Neurological:     Mental Status: She is alert.  Psychiatric:        Behavior: Behavior normal.     ED Results / Procedures / Treatments   Labs (all labs ordered are listed, but only abnormal results are displayed) Labs Reviewed  COMPREHENSIVE METABOLIC PANEL - Abnormal; Notable for the following components:      Result Value   Potassium 3.3 (*)  Glucose, Bld 207 (*)    BUN 26 (*)    Creatinine, Ser 1.59 (*)    Total Protein 8.5 (*)    Total Bilirubin 1.4 (*)    GFR, Estimated 32 (*)    Anion gap 16 (*)    All other components within normal limits  LACTIC ACID, PLASMA - Abnormal; Notable for the following components:   Lactic Acid, Venous 4.1 (*)    All other components within normal limits  LACTIC ACID, PLASMA - Abnormal; Notable for the following components:   Lactic Acid, Venous 2.6 (*)    All other components within normal limits  CBC WITH DIFFERENTIAL/PLATELET - Abnormal; Notable for the following components:   WBC 14.7 (*)    RBC 5.83 (*)    Hemoglobin 16.3 (*)    HCT 50.2 (*)    Neutro Abs 12.5 (*)    Abs Immature Granulocytes 0.08 (*)    All other components within normal limits  URINALYSIS, ROUTINE W REFLEX MICROSCOPIC - Abnormal; Notable for the following components:   APPearance CLOUDY (*)    Ketones, ur 5 (*)    Protein, ur 100 (*)    Bacteria, UA RARE (*)    All other components within normal limits  RESP PANEL BY RT-PCR (FLU A&B, COVID) ARPGX2  LIPASE, BLOOD  BASIC METABOLIC PANEL  CBC  HEMOGLOBIN A1C    EKG None  Radiology CT Abdomen Pelvis Wo Contrast  Result Date: 01/26/2021 CLINICAL DATA:  Unspecified abdominal pain, vomiting EXAM: CT ABDOMEN AND PELVIS WITHOUT CONTRAST  TECHNIQUE: Multidetector CT imaging of the abdomen and pelvis was performed following the standard protocol without IV contrast. COMPARISON:  01/06/2021 FINDINGS: Lower chest: Large hiatal hernia is present which appears fluid-filled. The visualized lung bases are clear. The visualized heart and pericardium are unremarkable. Hepatobiliary: Stable cyst within the left hepatic lobe. No definite solid intrahepatic mass identified on this noncontrast examination. Cholecystectomy has been performed. No intra or extrahepatic biliary ductal dilation. Pancreas: Unremarkable Spleen: Unremarkable Adrenals/Urinary Tract: The adrenal glands are unremarkable. The kidneys are normal in size and position. 6 mm nonobstructing calculus noted within the lower pole of the right kidney. The kidneys are otherwise unremarkable. The bladder is unremarkable. Stomach/Bowel: There is a high-grade proximal large bowel obstruction involving the mid ascending colon with the point transition best seen axial image # 39/2 and coronal image # 87/5. 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 An underlying intrinsic neoplasm, however, is not excluded on this examination. Proximally, the small bowel is dilated and fluid-filled. Distally, the large bowel is decompressed. There is moderate sigmoid diverticulosis. No free intraperitoneal gas or fluid. Vascular/Lymphatic: Moderate atherosclerotic calcification within the abdominal aorta. No aortic aneurysm. No pathologic adenopathy within the abdomen and pelvis. Reproductive: Status post hysterectomy. No adnexal masses. Other: Tiny fat containing umbilical hernia. The rectum is unremarkable. Musculoskeletal: No acute bone abnormality. No lytic or blastic bone lesion. IMPRESSION: High-grade proximal large bowel obstruction involving the mid ascending colon. Given the extensive inflammatory changes noted on prior examination, this may  represent a post inflammatory stricture, however, correlation with endoscopy is recommended as an underlying neoplasm could appear similarly. Mild right nonobstructing nephrolithiasis. Large hiatal hernia Aortic Atherosclerosis (ICD10-I70.0). Electronically Signed   By: Fidela Salisbury MD   On: 01/26/2021 21:50   DG Chest Port 1 View  Result Date: 01/26/2021 CLINICAL DATA:  Onset abdominal pain and vomiting this morning. EXAM: PORTABLE CHEST 1 VIEW COMPARISON:  PA and lateral chest 04/08/2014. FINDINGS: Lungs clear. Heart size normal. Hiatal hernia again seen. No pneumothorax or pleural fluid. No acute or focal bony abnormality. IMPRESSION: No acute disease. Hiatal hernia. Electronically Signed   By: Inge Rise M.D.   On: 01/26/2021 20:00   DG Abd Portable 1 View  Result Date: 01/26/2021 CLINICAL DATA:  Onset abdominal pain and vomiting this morning. EXAM: PORTABLE ABDOMEN - 1 VIEW COMPARISON:  None. FINDINGS: Gas-filled loops of small bowel are dilated up to approximately 4.3 cm. There is some gas in the ascending colon. No unexpected abdominal calcification is seen. No acute bony abnormality. IMPRESSION: Dilated loops of small bowel with gas in the colon worrisome for early or partial small bowel obstruction. Electronically Signed   By: Inge Rise M.D.   On: 01/26/2021 20:02    Procedures Procedures   Medications Ordered in ED Medications  0.9 %  sodium chloride infusion (has no administration in time range)  insulin aspart (novoLOG) injection 0-9 Units (has no administration in time range)  ondansetron (ZOFRAN) injection 4 mg (4 mg Intravenous Given 01/26/21 1959)  sodium chloride 0.9 % bolus 1,000 mL (0 mLs Intravenous Stopped 01/26/21 2130)  lactated ringers bolus 1,000 mL (0 mLs Intravenous Stopped 01/26/21 2343)  LORazepam (ATIVAN) injection 0.5 mg (0.5 mg Intravenous Given 01/27/21 0038)    ED Course  I have reviewed the triage vital signs and the nursing notes.  Pertinent labs &  imaging results that were available during my care of the patient were reviewed by me and considered in my medical decision making (see chart for details).  Clinical Course as of 01/27/21 0050  Wed Jan 26, 2021  2329 CT Abdomen Pelvis Wo Contrast [JR]    Clinical Course User Index [JR] Ella Guillotte, [place] N, PA-C   MDM Rules/Calculators/A&P                          Patient is an 80 year old female presenting with worsening nausea and vomiting, stop passing gas this morning.  Recent CT imaging a couple of weeks ago showed narrowing of the bowels, concerning for IBD versus colitis versus carcinoma.  Presentation today is concerning for SBO, plain film obtained in triage also supports these concerns.  She is vomiting on evaluation.  CT scan is ordered and is consistent with SBO, independently reviewed and interpreted by myself.  Blood work reveals leukocytosis, all concentrated 16.3, likely due to dehydration.  She does have AKI as well with creatinine of 1.6.  Lactic acidosis is present.  She is given 30 cc/kg fluids.  Will place NG OG tube, patient will require admission.  Dr. Dema Severin with general surgery was also consulted, agrees with care plan.  Also recommends GI consult during admission. Final Clinical Impression(s) / ED Diagnoses Final diagnoses:  Large bowel obstruction [facility])    Rx / DC Orders ED Discharge Orders    None       Joseluis Alessio, [place] N, PA-C 01/27/21 0050    Malvin Johns, MD 01/27/21 [phone]

## 2021-01-26 NOTE — ED Triage Notes (Signed)
Pt c/o abdominal pain and emesis since this morning. Pt has hernia to lower abdomen.

## 2021-01-26 NOTE — ED Notes (Signed)
Patient said she is not able to give a urine sample at this time. She said she should be able to give one in a little while.

## 2021-01-26 NOTE — ED Notes (Signed)
Attempted NG tube placement, unsuccessful x 1. Asked patient if we could attempt again and patient refused. States she wants to be sedated for it.

## 2021-01-27 ENCOUNTER — Inpatient Hospital Stay (HOSPITAL_COMMUNITY): Payer: Medicare Other

## 2021-01-27 DIAGNOSIS — I959 Hypotension, unspecified: Secondary | ICD-10-CM

## 2021-01-27 DIAGNOSIS — R739 Hyperglycemia, unspecified: Secondary | ICD-10-CM

## 2021-01-27 DIAGNOSIS — K56609 Unspecified intestinal obstruction, unspecified as to partial versus complete obstruction: Secondary | ICD-10-CM | POA: Diagnosis not present

## 2021-01-27 LAB — LACTIC ACID, PLASMA: Lactic Acid, Venous: 1.1 mmol/L (ref 0.5–1.9)

## 2021-01-27 LAB — BASIC METABOLIC PANEL
Anion gap: 11 (ref 5–15)
BUN: 27 mg/dL — ABNORMAL HIGH (ref 8–23)
CO2: 25 mmol/L (ref 22–32)
Calcium: 8.7 mg/dL — ABNORMAL LOW (ref 8.9–10.3)
Chloride: 109 mmol/L (ref 98–111)
Creatinine, Ser: 1.01 mg/dL — ABNORMAL HIGH (ref 0.44–1.00)
GFR, Estimated: 56 mL/min — ABNORMAL LOW (ref >60)
Glucose, Bld: 126 mg/dL — ABNORMAL HIGH (ref 70–99)
Potassium: 3.2 mmol/L — ABNORMAL LOW (ref 3.5–5.1)
Sodium: 145 mmol/L (ref 135–145)

## 2021-01-27 LAB — CBG MONITORING, ED: Glucose-Capillary: 123 mg/dL — ABNORMAL HIGH (ref 70–99)

## 2021-01-27 LAB — GLUCOSE, CAPILLARY
Glucose-Capillary: 110 mg/dL — ABNORMAL HIGH (ref 70–99)
Glucose-Capillary: 115 mg/dL — ABNORMAL HIGH (ref 70–99)
Glucose-Capillary: 114 mg/dL — ABNORMAL HIGH (ref 70–99)
Glucose-Capillary: 85 mg/dL (ref 70–99)

## 2021-01-27 LAB — CBC
HCT: 41.2 % (ref 36.0–46.0)
Hemoglobin: 13.1 g/dL (ref 12.0–15.0)
MCH: 28.5 pg (ref 26.0–34.0)
MCHC: 31.8 g/dL (ref 30.0–36.0)
MCV: 89.6 fL (ref 80.0–100.0)
Platelets: 268 10*3/uL (ref 150–400)
RBC: 4.6 MIL/uL (ref 3.87–5.11)
RDW: 14.4 % (ref 11.5–15.5)
WBC: 8.7 10*3/uL (ref 4.0–10.5)
nRBC: 0 % (ref 0.0–0.2)

## 2021-01-27 LAB — HEMOGLOBIN A1C
Hgb A1c MFr Bld: 6 % — ABNORMAL HIGH (ref 4.8–5.6)
Mean Plasma Glucose: 125.5 mg/dL

## 2021-01-27 LAB — RESP PANEL BY RT-PCR (FLU A&B, COVID) ARPGX2
Influenza A by PCR: NEGATIVE
Influenza B by PCR: NEGATIVE
SARS Coronavirus 2 by RT PCR: NEGATIVE

## 2021-01-27 LAB — PROCALCITONIN: Procalcitonin: <0.1 ng/mL

## 2021-01-27 MED ORDER — PANTOPRAZOLE SODIUM 40 MG PO TBEC
40.0000 mg | DELAYED_RELEASE_TABLET | Freq: Every day | ORAL | Status: DC
  Administered 2021-01-27 – 2021-01-28 (×2): 40 mg via ORAL
  Filled 2021-01-27 (×3): qty 1

## 2021-01-27 MED ORDER — LORAZEPAM 2 MG/ML IJ SOLN
0.5000 mg | Freq: Once | INTRAMUSCULAR | Status: AC
  Administered 2021-01-27: 0.5 mg via INTRAVENOUS
  Filled 2021-01-27: qty 1

## 2021-01-27 MED ORDER — LEVOTHYROXINE SODIUM 100 MCG/5ML IV SOLN
50.0000 ug | Freq: Every day | INTRAVENOUS | Status: DC
  Administered 2021-01-30: 50 ug via INTRAVENOUS
  Filled 2021-01-27 (×2): qty 5

## 2021-01-27 MED ORDER — BENZOCAINE 20 % MT AERO
INHALATION_SPRAY | Freq: Once | OROMUCOSAL | Status: AC
  Filled 2021-01-27: qty 57

## 2021-01-27 MED ORDER — LEVOTHYROXINE SODIUM 25 MCG PO TABS
125.0000 ug | ORAL_TABLET | Freq: Every day | ORAL | Status: DC
  Administered 2021-01-27: 125 ug via ORAL
  Filled 2021-01-27: qty 1

## 2021-01-27 NOTE — ED Notes (Signed)
Patient is resting comfortably. 

## 2021-01-27 NOTE — Progress Notes (Signed)
Initial Nutrition Assessment  DOCUMENTATION CODES:   Obesity unspecified  INTERVENTION:  - diet advancement as medically feasible. - will complete NFPE at follow-up.   NUTRITION DIAGNOSIS:   Inadequate oral intake related to inability to eat as evidenced by NPO status.  GOAL:   Patient will meet greater than or equal to 90% of their needs  MONITOR:   Diet advancement,Labs,Weight trends  REASON FOR ASSESSMENT:   Malnutrition Screening Tool  ASSESSMENT:   80 y.o. female with medical history of remote hx of thyroid cancer, celiac disease, rectal bleed, COPD, HTN, and iron deficiency anemia. She presented to the ED due to abdominal pain, bloating, and persistent N/V. She had a CT abdomen on 4/14 which showed abnormal thickening and luminal narrowing of the cecum and ascending colon with differential including carcinoid, IBD, or focal colitis. A colonoscopy with GI was planned for June however presented d/t worsening symptoms. Repeat CT now showing high-grade proximal large bowel obstruction involving the mid-ascending colon.  Patient has been NPO since admission. NGT was placed today around 0100 and was clamped at the time of RD visit earlier this afternoon.   Patient was laying in bed with a female visitor at bedside. Patient was pleasant, kept eyes closed throughout most of interaction, and reported that her stomach/abdomen is feeling much better but that today as a whole has not been a good day.   She reports wanting a very cold drink. Offered to let RN know so that oral swabs with very cold water can be used to moisten mouth; patient agreeable.  Unable to obtain any nutrition-related information at this time.  Weight yesterday was documented as 180 lb which appears to be a stated weight. Weight on 11/23/20 was 195 lb. This would indicate 15 lb weight loss (7.7% body weight) in the past 2 months; significant for time frame.    Labs reviewed; CBGs: 123, 110, 115 mg/dl, K: 3.2  mmol/l, BUN: 27 mg/dl, creatinine: 1.01 mg/dl, Ca: 8.7 mg/dl, GFR: 56 ml/min. Medications reviewed; sliding scale novolog, 125 mcg oral synthroid/day, 40 mg oral protonix/day.  IVF; NS @ 75 ml/hr.    NUTRITION - FOCUSED PHYSICAL EXAM:  unable to complete at this time.   Diet Order:   Diet Order            Diet NPO time specified  Diet effective now                 EDUCATION NEEDS:   Not appropriate for education at this time  Skin:  Skin Assessment: Reviewed RN Assessment  Last BM:  PTA/unknown  Height:   Ht Readings from Last 1 Encounters:  01/26/21 5' 3"  (1.6 m)    Weight:   Wt Readings from Last 1 Encounters:  01/26/21 81.6 kg    Estimated Nutritional Needs:  Kcal:  1500-1700 kcal Protein:  70-80 grams Fluid:  >/= 1.8 L/day      Jarome Matin, MS, RD, LDN, CNSC Inpatient Clinical Dietitian RD pager # available in AMION  After hours/weekend pager # available in [facility]

## 2021-01-27 NOTE — H&P (Addendum)
History and Physical    Angelica [name] WYO:[id number] DOB: 05/17/1941 DOA: 01/26/2021  PCP: Velna Hatchet, MD  Patient coming from: Home   I have personally briefly reviewed patient's old medical records in [place]  Chief Complaint: abdominal pain, nausea, vomiting   HPI: Angelica [name] is a 80 y.o. female with medical history significant for remote hx of thyroid cancer, celiac disease, rectal bleed, COPD, HTN, iron deficiency anemia who presents with abdominal pain, bloating and persistent nausea and vomiting.  Patient recently had CT of the abdomen done 4/14 showing abnormal thickening and luminal narrowing of the cecum and ascending colon with differential including carcinoid, IBD or focal colitis.  She had plan colonoscopy with GI in June however presents today given worsening symptoms. Repeat CT now showing high-grade proximal large bowel obstruction involving the mid ascending colon.  ED Course: She was initially hypotensive with tachycardic tachypneic on room air in the ED.  States her symptoms have largely resolved.  She had leukocytosis of 14.7, lactate of 4.1.  Potassium of 3.3.  Creatinine of 1.59 from prior 0.99.  Anion gap 16.  Surgery Dr. Dema Severin has evaluated patient at bedside and recommends NG tube placement and GI consult.   Review of Systems: Constitutional: No Weight Change, No Fever ENT/Mouth: No sore throat, No Rhinorrhea Eyes: No Eye Pain, No Vision Changes Cardiovascular: No Chest Pain, no SOB,  Respiratory: No Cough, No Sputum Gastrointestinal: + Nausea, + Vomiting, No Diarrhea, No Constipation, + Pain Genitourinary: no Urinary Incontinence, No Urgency, No Flank Pain Musculoskeletal: No Arthralgias, No Myalgias Skin: No Skin Lesions, No Pruritus, Neuro: no Weakness, No Numbness Psych: No Anxiety/Panic, No Depression,+ decrease appetite Heme/Lymph: No Bruising, No Bleeding  Past Medical History:  Diagnosis Date  . Anemia   . Arthritis   . Asthma   .  Celiac disease 11/17/2013  . COPD (chronic obstructive pulmonary disease) (HCC)    on 2L prn home O2  . Hypertension   . Rectal bleed 10/23/2018  . Thyroid cancer ([place])    remote    Past Surgical History:  Procedure Laterality Date  . ABDOMINAL HYSTERECTOMY    . BREAST EXCISIONAL BIOPSY Bilateral   . CHOLECYSTECTOMY    . COLONOSCOPY  07/05/2012   Procedure: COLONOSCOPY;  Surgeon: Winfield Cunas., MD;  Location: [facility] ENDOSCOPY;  Service: Endoscopy;  Laterality: N/A;  . COLONOSCOPY N/A 03/12/2015   Procedure: COLONOSCOPY;  Surgeon: Laurence Spates, MD;  Location: [facility] ENDOSCOPY;  Service: Endoscopy;  Laterality: N/A;  . ESOPHAGOGASTRODUODENOSCOPY  07/05/2012   Procedure: ESOPHAGOGASTRODUODENOSCOPY (EGD);  Surgeon: Winfield Cunas., MD;  Location: [facility] ENDOSCOPY;  Service: Endoscopy;  Laterality: N/A;  . HOT HEMOSTASIS N/A 03/12/2015   Procedure: HOT HEMOSTASIS (ARGON PLASMA COAGULATION/BICAP);  Surgeon: Laurence Spates, MD;  Location: [facility] ENDOSCOPY;  Service: Endoscopy;  Laterality: N/A;  . TONSILLECTOMY       reports that she quit smoking about 12 years ago. Her smoking use included cigarettes. She has a 45.00 pack-year smoking history. She has never used smokeless tobacco. She reports that she does not drink alcohol and does not use drugs. Social History  Allergies  Allergen Reactions  . Other Hives    Pt states she has several other allergies but can't recall the names, pt recalled the medication to be codeine.  . Fosamax [Alendronate Sodium]   . Penicillins Other (See Comments)    Unknown Did it involve swelling of the face/tongue/throat, SOB, or low BP? Unknown Did it involve  sudden or severe rash/hives, skin peeling, or any reaction on the inside of your mouth or nose? Unknown Did you need to seek medical attention at a hospital or doctor's office? Unknown When did it last happen? If all above answers are "NO", may proceed with cephalosporin use.  . Sulfa Antibiotics      Family History  Problem Relation Age of Onset  . Heart disease Mother   . Kidney failure Father   . Cancer Sister        thyroid cancer     Prior to Admission medications   Medication Sig Start Date End Date Taking? Authorizing Provider  acetaminophen (TYLENOL) 500 MG tablet Take 500 mg by mouth every 6 (six) hours as needed (body ache).   Yes [provider]  albuterol (PROVENTIL HFA;VENTOLIN HFA) 108 (90 Base) MCG/ACT inhaler Inhale into the lungs every 6 (six) hours as needed for wheezing or shortness of breath.   Yes [provider]  amLODipine (NORVASC) 5 MG tablet Take 5 mg by mouth at bedtime.    Yes [provider]  docusate sodium (COLACE) 250 MG capsule Take 250 mg by mouth daily as needed for constipation.   Yes [provider]  levothyroxine (SYNTHROID, LEVOTHROID) 125 MCG tablet Take 125 mcg by mouth daily.   Yes [provider]  ondansetron (ZOFRAN ODT) 8 MG disintegrating tablet Take 1 tablet (8 mg total) by mouth every 8 (eight) hours as needed. 11/24/20  Yes Ripley Fraise, MD  OXYGEN Inhale 2 L into the lungs as needed (LOW OXYGEN).   Yes [provider]  pantoprazole (PROTONIX) 40 MG tablet Take 1 tablet (40 mg total) by mouth daily. 10/24/18  Yes Norval Morton, MD  hydrocortisone (ANUSOL-HC) 25 MG suppository Place 1 suppository (25 mg total) rectally 2 (two) times daily as needed for hemorrhoids or anal itching. Patient not taking: Reported on 01/27/2021 10/24/18   Norval Morton, MD  polyethylene glycol [facility]) packet Take 17 g by mouth 2 (two) times daily. Patient not taking: Reported on 01/27/2021 10/24/18   Norval Morton, MD    Physical Exam: Vitals:   01/27/21 0000 01/27/21 0015 01/27/21 0100 01/27/21 0145  BP: 118/65 120/90 (!) 146/91 116/65  Pulse: 99 (!) 106 (!) 123 (!) 108  Resp: (!) 22 20 (!) 22 18  Temp:      TempSrc:      SpO2: 91% 94% 93% 93%  Weight:      Height:         Constitutional: NAD, calm, comfortable, elderly female laying right-sided in bed Vitals:   01/27/21 0000 01/27/21 0015 01/27/21 0100 01/27/21 0145  BP: 118/65 120/90 (!) 146/91 116/65  Pulse: 99 (!) 106 (!) 123 (!) 108  Resp: (!) 22 20 (!) 22 18  Temp:      TempSrc:      SpO2: 91% 94% 93% 93%  Weight:      Height:       Eyes: PERRL, lids and conjunctivae normal ENMT: Mucous membranes are moist.  Neck: normal, supple Respiratory: clear to auscultation bilaterally, no wheezing, no crackles. Normal respiratory effort. No accessory muscle use.  Cardiovascular: Regular rate and rhythm, no murmurs / rubs / gallops. No extremity edema.  Abdomen: Distended abdomen with mild tenderness to palpation of the right upper quadrant.  No rebound tenderness, guarding or rigidity.  Minimal bowel sounds. Musculoskeletal: no clubbing / cyanosis. No joint deformity upper and lower extremities. Good ROM, no contractures. Normal muscle  tone.  Skin: no rashes, lesions, ulcers. No induration Neurologic: CN 2-12 grossly intact. Sensation intact,  Strength 5/5 in all 4.  Psychiatric: Normal judgment and insight. Alert and oriented x 3. Normal mood.    Labs on Admission: I have personally reviewed following labs and imaging studies  CBC: Recent Labs  Lab 01/26/21 1830  WBC 14.7*  NEUTROABS 12.5*  HGB 16.3*  HCT 50.2*  MCV 86.1  PLT 956   Basic Metabolic Panel: Recent Labs  Lab 01/26/21 1830  NA 140  K 3.3*  CL 101  CO2 23  GLUCOSE 207*  BUN 26*  CREATININE 1.59*  CALCIUM 10.1   GFR: Estimated Creatinine Clearance: 28.1 mL/min (A) (by C-G formula based on SCr of 1.59 mg/dL (H)). Liver Function Tests: Recent Labs  Lab 01/26/21 1830  AST 37  ALT 29  ALKPHOS 59  BILITOT 1.4*  PROT 8.5*  ALBUMIN 4.6   Recent Labs  Lab 01/26/21 1830  LIPASE 35   No results for input(s): AMMONIA in the last 168 hours. Coagulation Profile: No results for input(s): INR, PROTIME in the last 168  hours. Cardiac Enzymes: No results for input(s): CKTOTAL, CKMB, CKMBINDEX, TROPONINI in the last 168 hours. BNP (last 3 results) No results for input(s): PROBNP in the last 8760 hours. HbA1C: No results for input(s): HGBA1C in the last 72 hours. CBG: Recent Labs  Lab 01/27/21 0143  GLUCAP 123*   Lipid Profile: No results for input(s): CHOL, HDL, LDLCALC, TRIG, CHOLHDL, LDLDIRECT in the last 72 hours. Thyroid Function Tests: No results for input(s): TSH, T4TOTAL, FREET4, T3FREE, THYROIDAB in the last 72 hours. Anemia Panel: No results for input(s): VITAMINB12, FOLATE, FERRITIN, TIBC, IRON, RETICCTPCT in the last 72 hours. Urine analysis:    Component Value Date/Time   COLORURINE YELLOW 01/26/2021 2300   APPEARANCEUR CLOUDY (A) 01/26/2021 2300   LABSPEC 1.019 01/26/2021 2300   PHURINE 5.0 01/26/2021 2300   GLUCOSEU NEGATIVE 01/26/2021 2300   HGBUR NEGATIVE 01/26/2021 2300   BILIRUBINUR NEGATIVE 01/26/2021 2300   KETONESUR 5 (A) 01/26/2021 2300   PROTEINUR 100 (A) 01/26/2021 2300   UROBILINOGEN 0.2 06/19/2009 1448   NITRITE NEGATIVE 01/26/2021 2300   LEUKOCYTESUR NEGATIVE 01/26/2021 2300    Radiological Exams on Admission: CT Abdomen Pelvis Wo Contrast  Result Date: 01/26/2021 CLINICAL DATA:  Unspecified abdominal pain, vomiting EXAM: CT ABDOMEN AND PELVIS WITHOUT CONTRAST TECHNIQUE: Multidetector CT imaging of the abdomen and pelvis was performed following the standard protocol without IV contrast. COMPARISON:  01/06/2021 FINDINGS: Lower chest: Large hiatal hernia is present which appears fluid-filled. The visualized lung bases are clear. The visualized heart and pericardium are unremarkable. Hepatobiliary: Stable cyst within the left hepatic lobe. No definite solid intrahepatic mass identified on this noncontrast examination. Cholecystectomy has been performed. No intra or extrahepatic biliary ductal dilation. Pancreas: Unremarkable Spleen: Unremarkable Adrenals/Urinary Tract:  The adrenal glands are unremarkable. The kidneys are normal in size and position. 6 mm nonobstructing calculus noted within the lower pole of the right kidney. The kidneys are otherwise unremarkable. The bladder is unremarkable. Stomach/Bowel: There is a high-grade proximal large bowel obstruction involving the mid ascending colon with the point transition best seen axial image # 39/2 and coronal image # 87/5. 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 An underlying intrinsic neoplasm, however, is not excluded on this examination. Proximally, the small bowel is dilated and fluid-filled. Distally, the large bowel is decompressed. There is moderate sigmoid diverticulosis.  No free intraperitoneal gas or fluid. Vascular/Lymphatic: Moderate atherosclerotic calcification within the abdominal aorta. No aortic aneurysm. No pathologic adenopathy within the abdomen and pelvis. Reproductive: Status post hysterectomy. No adnexal masses. Other: Tiny fat containing umbilical hernia. The rectum is unremarkable. Musculoskeletal: No acute bone abnormality. No lytic or blastic bone lesion. IMPRESSION: High-grade proximal large bowel obstruction involving the mid ascending colon. 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 Mild right nonobstructing nephrolithiasis. Large hiatal hernia Aortic Atherosclerosis (ICD10-I70.0). Electronically Signed   By: Fidela Salisbury MD   On: 01/26/2021 21:50   DG Chest Port 1 View  Result Date: 01/26/2021 CLINICAL DATA:  Onset abdominal pain and vomiting this morning. EXAM: PORTABLE CHEST 1 VIEW COMPARISON:  PA and lateral chest 04/08/2014. FINDINGS: Lungs clear. Heart size normal. Hiatal hernia again seen. No pneumothorax or pleural fluid. No acute or focal bony  abnormality. IMPRESSION: No acute disease. Hiatal hernia. Electronically Signed   By: Inge Rise M.D.   On: 01/26/2021 20:00   DG Abd Portable 1 View  Result Date: 01/26/2021 CLINICAL DATA:  Onset abdominal pain and vomiting this morning. EXAM: PORTABLE ABDOMEN - 1 VIEW COMPARISON:  None. FINDINGS: Gas-filled loops of small bowel are dilated up to approximately 4.3 cm. There is some gas in the ascending colon. No unexpected abdominal calcification is seen. No acute bony abnormality. IMPRESSION: Dilated loops of small bowel with gas in the colon worrisome for early or partial small bowel obstruction. Electronically Signed   By: Inge Rise M.D.   On: 01/26/2021 20:02      Assessment/Plan  Large bowel obstruction - surgery Dr. Dema Severin has evaluated and recommend NG tube placement and GI consult  - pt initially could not tolerate NG tube placement. Discussed with nursing to re-attempt with IV ativan beforehand - continuous IV fluid   SIR criteria -pt presented with tachycardia, tachypnea and has leukocytosis but no clear source.  Unclear if she has colitis as her symptoms are improving. -Obtain procalcitonin with low threshold of starting antibiotics for intra-abdominal infection pending results or if she develops any fever overnight  Mild Hypokalemia -follow with repeat tomorrow after fluids  Hyperglycemia -Obtain hemoglobin A1c -Sensitive sliding scale  Hypotension -Patient initially presented with blood pressure systolic of 63O.  Improved following IV fluid resuscitation -Hold all antihypertensives  Hypothyroidism w/ remote history of thyroid cancer -Continue levothyroxine when able to tolerate p.o.  COPD - PRN O2 at night at baseline   DVT prophylaxis:.SCDs Code Status: Full Family Communication: Plan discussed with patient at bedside  disposition Plan: Home with at least 2 midnight stays  Consults called: Surgery Admission status: inpatient  Level of care:  Telemetry  Status is: Inpatient  Remains inpatient appropriate because:Inpatient level of care appropriate due to severity of illness   Dispo: The patient is from: Home              Anticipated d/c is to: Home              Patient currently is not medically stable to d/c.   Difficult to place patient No         Orene Desanctis DO [facility]   If 7PM-7AM, please contact night-coverage [email or web address]   01/27/2021, 2:23 AM

## 2021-01-27 NOTE — Progress Notes (Signed)
Patient ID: Angelica [name], female   DOB: October 18, 1940, 80 y.o.   MRN: [id number]  Pt with some cramping pain Abdomen soft with minimal tenderness and guarding  Continue NPO and NG GI to consult

## 2021-01-27 NOTE — ED Notes (Signed)
ED TO INPATIENT HANDOFF REPORT  Name/Age/Gender Angelica [name] 80 y.o. female  Code Status    Code Status Orders  (From admission, onward)         Start     Ordered   01/26/21 2359  Full code  Continuous        01/26/21 2358        Code Status History    Date Active Date Inactive Code Status Order ID Comments User Context   10/23/2018 1321 10/24/2018 1453 DNR [id number]  Karmen Bongo, MD ED   07/04/2012 0149 07/06/2012 1557 DNR [id number]  [place], [place] Menise, RN Inpatient   Advance Care Planning Activity      Home/SNF/Other Home  Chief Complaint Large bowel obstruction ([place]) [K56.609]  Level of Care/Admitting Diagnosis ED Disposition    ED Disposition Condition [place] [facility] Area: [facility] [[id number]]  Level of Care: Telemetry [5]  Admit to tele based on following criteria: Other see comments  Comments: rate control  May admit patient to [facility] or [facility] if equivalent level of care is available:: No  Covid Evaluation: Asymptomatic Screening Protocol (No Symptoms)  Diagnosis: Large bowel obstruction [facility]) [[id number]]  Admitting Physician: Orene Desanctis [[id number]]  Attending Physician: Orene Desanctis [[id number]]  Estimated length of stay: past midnight tomorrow  Certification:: I certify this patient will need inpatient services for at least 2 midnights       Medical History Past Medical History:  Diagnosis Date  . Anemia   . Arthritis   . Asthma   . Celiac disease 11/17/2013  . COPD (chronic obstructive pulmonary disease) (HCC)    on 2L prn home O2  . Hypertension   . Rectal bleed 10/23/2018  . Thyroid cancer (HCC)    remote    Allergies Allergies  Allergen Reactions  . Other Hives    Pt states she has several other allergies but can't recall the names, pt recalled the medication to be codeine.  . Fosamax [Alendronate Sodium]   . Penicillins Other (See Comments)    Unknown Did it involve swelling of the  face/tongue/throat, SOB, or low BP? Unknown Did it involve sudden or severe rash/hives, skin peeling, or any reaction on the inside of your mouth or nose? Unknown Did you need to seek medical attention at a hospital or doctor's office? Unknown When did it last happen? If all above answers are "NO", may proceed with cephalosporin use.  . Sulfa Antibiotics     IV Location/Drains/Wounds Patient Lines/Drains/Airways Status    Active Line/Drains/Airways    Name Placement date Placement time Site Days   Peripheral IV 10/21/15 Right Hand 10/21/15  1130  Hand  1925   Peripheral IV 01/26/21 Right Antecubital 01/26/21  1858  Antecubital  1   NG/OG Tube Nasogastric 16 Fr. Right nare Xray;Aucultation 01/27/21  0103  Right nare  less than 1          Labs/Imaging Results for orders placed or performed during the hospital encounter of 01/26/21 (from the past 48 hour(s))  Comprehensive metabolic panel     Status: Abnormal   Collection Time: 01/26/21  6:30 PM  Result Value Ref Range   Sodium 140 135 - 145 mmol/L   Potassium 3.3 (L) 3.5 - 5.1 mmol/L   Chloride 101 98 - 111 mmol/L   CO2 23 22 - 32 mmol/L   Glucose, Bld 207 (H) 70 - 99 mg/dL    Comment: Glucose reference  range applies only to samples taken after fasting for at least 8 hours.   BUN 26 (H) 8 - 23 mg/dL   Creatinine, Ser 1.59 (H) 0.44 - 1.00 mg/dL   Calcium 10.1 8.9 - 10.3 mg/dL   Total Protein 8.5 (H) 6.5 - 8.1 g/dL   Albumin 4.6 3.5 - 5.0 g/dL   AST 37 15 - 41 U/L   ALT 29 0 - 44 U/L   Alkaline Phosphatase 59 38 - 126 U/L   Total Bilirubin 1.4 (H) 0.3 - 1.2 mg/dL   GFR, Estimated 32 (L) >60 mL/min    Comment: (NOTE) Calculated using the CKD-EPI Creatinine Equation (2021)    Anion gap 16 (H) 5 - 15    Comment: Performed at [facility], [place] [address], [place], [place] [address]  CBC with Differential     Status: Abnormal   Collection Time: 01/26/21  6:30 PM  Result Value Ref Range   WBC 14.7 (H)  4.0 - 10.5 K/uL   RBC 5.83 (H) 3.87 - 5.11 MIL/uL   Hemoglobin 16.3 (H) 12.0 - 15.0 g/dL   HCT 50.2 (H) 36.0 - 46.0 %   MCV 86.1 80.0 - 100.0 fL   MCH 28.0 26.0 - 34.0 pg   MCHC 32.5 30.0 - 36.0 g/dL   RDW 14.2 11.5 - 15.5 %   Platelets 353 150 - 400 K/uL   nRBC 0.0 0.0 - 0.2 %   Neutrophils Relative % 86 %   Neutro Abs 12.5 (H) 1.7 - 7.7 K/uL   Lymphocytes Relative 7 %   Lymphs Abs 1.1 0.7 - 4.0 K/uL   Monocytes Relative 6 %   Monocytes Absolute 0.9 0.1 - 1.0 K/uL   Eosinophils Relative 0 %   Eosinophils Absolute 0.0 0.0 - 0.5 K/uL   Basophils Relative 0 %   Basophils Absolute 0.0 0.0 - 0.1 K/uL   Immature Granulocytes 1 %   Abs Immature Granulocytes 0.08 (H) 0.00 - 0.07 K/uL    Comment: Performed at [facility], [place] [address] [place], [place] [address]  Lipase, blood     Status: None   Collection Time: 01/26/21  6:30 PM  Result Value Ref Range   Lipase 35 11 - 51 U/L    Comment: Performed at [facility], [place] [address] [place], [place] [address]  Lactic acid, plasma     Status: Abnormal   Collection Time: 01/26/21  7:00 PM  Result Value Ref Range   Lactic Acid, Venous 4.1 (HH) 0.5 - 1.9 mmol/L    Comment: CRITICAL RESULT CALLED TO, READ BACK BY AND VERIFIED WITH: Larene Beach, RN @ 2017 ON 01/26/21 Sandy Salaam Performed at [facility], [place] [address], [place], [place] [address]   Lactic acid, plasma     Status: Abnormal   Collection Time: 01/26/21  8:44 PM  Result Value Ref Range   Lactic Acid, Venous 2.6 (HH) 0.5 - 1.9 mmol/L    Comment: CRITICAL VALUE NOTED.  VALUE IS CONSISTENT WITH PREVIOUSLY REPORTED AND CALLED VALUE. Performed at [facility], [place] [address] [place], [place] [address]   Resp Panel by RT-PCR (Flu A&B, Covid) Nasopharyngeal Swab     Status: None   Collection Time: 01/26/21 10:54 PM   Specimen: Nasopharyngeal Swab; Nasopharyngeal(NP) swabs in vial transport medium  Result Value  Ref Range   SARS Coronavirus 2 by RT PCR NEGATIVE NEGATIVE    Comment: (NOTE) SARS-CoV-2 target nucleic acids  are NOT DETECTED.  The SARS-CoV-2 RNA is generally detectable in upper respiratory specimens during the acute phase of infection. The lowest concentration of SARS-CoV-2 viral copies this assay can detect is 138 copies/mL. A negative result does not preclude SARS-Cov-2 infection and should not be used as the sole basis for treatment or other patient management decisions. A negative result may occur with  improper specimen collection/handling, submission of specimen other than nasopharyngeal swab, presence of viral mutation(s) within the areas targeted by this assay, and inadequate number of viral copies(<138 copies/mL). A negative result must be combined with clinical observations, patient history, and epidemiological information. The expected result is Negative.  Fact Sheet for Patients:  EntrepreneurPulse.com.au  Fact Sheet for Healthcare Providers:  IncredibleEmployment.be  This test is no t yet approved or cleared by the [place] FDA and  has been authorized for detection and/or diagnosis of SARS-CoV-2 by FDA under an Emergency Use Authorization (EUA). This EUA will remain  in effect (meaning this test can be used) for the duration of the COVID-19 declaration under Section 564(b)(1) of the Act, 21 U.S.C.section 360bbb-3(b)(1), unless the authorization is terminated  or revoked sooner.       Influenza A by PCR NEGATIVE NEGATIVE   Influenza B by PCR NEGATIVE NEGATIVE    Comment: (NOTE) The Xpert Xpress SARS-CoV-2/FLU/RSV plus assay is intended as an aid in the diagnosis of influenza from Nasopharyngeal swab specimens and should not be used as a sole basis for treatment. Nasal washings and aspirates are unacceptable for Xpert Xpress SARS-CoV-2/FLU/RSV testing.  Fact Sheet for  Patients: EntrepreneurPulse.com.au  Fact Sheet for Healthcare Providers: IncredibleEmployment.be  This test is not yet approved or cleared by the [place] FDA and has been authorized for detection and/or diagnosis of SARS-CoV-2 by FDA under an Emergency Use Authorization (EUA). This EUA will remain in effect (meaning this test can be used) for the duration of the COVID-19 declaration under Section 564(b)(1) of the Act, 21 U.S.C. section 360bbb-3(b)(1), unless the authorization is terminated or revoked.  Performed at [facility], [place] [address], [place], [place] [address]   Urinalysis, Routine w reflex microscopic Urine, Clean Catch     Status: Abnormal   Collection Time: 01/26/21 11:00 PM  Result Value Ref Range   Color, Urine YELLOW YELLOW   APPearance CLOUDY (A) CLEAR   Specific Gravity, Urine 1.019 1.005 - 1.030   pH 5.0 5.0 - 8.0   Glucose, UA NEGATIVE NEGATIVE mg/dL   Hgb urine dipstick NEGATIVE NEGATIVE   Bilirubin Urine NEGATIVE NEGATIVE   Ketones, ur 5 (A) NEGATIVE mg/dL   Protein, ur 100 (A) NEGATIVE mg/dL   Nitrite NEGATIVE NEGATIVE   Leukocytes,Ua NEGATIVE NEGATIVE   RBC / HPF 0-5 0 - 5 RBC/hpf   WBC, UA 11-20 0 - 5 WBC/hpf   Bacteria, UA RARE (A) NONE SEEN   Squamous Epithelial / LPF 0-5 0 - 5   WBC Clumps PRESENT    Mucus PRESENT    Hyaline Casts, UA PRESENT     Comment: Performed at [facility], [place] [address] [place], [place] [address]  CBG monitoring, ED     Status: Abnormal   Collection Time: 01/27/21  1:43 AM  Result Value Ref Range   Glucose-Capillary 123 (H) 70 - 99 mg/dL    Comment: Glucose reference range applies only to samples taken after fasting for at least 8 hours.   Comment 1 Notify RN    Comment 2 Document in  Chart    CT Abdomen Pelvis Wo Contrast  Result Date: 01/26/2021 CLINICAL DATA:  Unspecified abdominal pain, vomiting EXAM: CT ABDOMEN AND PELVIS WITHOUT  CONTRAST TECHNIQUE: Multidetector CT imaging of the abdomen and pelvis was performed following the standard protocol without IV contrast. COMPARISON:  01/06/2021 FINDINGS: Lower chest: Large hiatal hernia is present which appears fluid-filled. The visualized lung bases are clear. The visualized heart and pericardium are unremarkable. Hepatobiliary: Stable cyst within the left hepatic lobe. No definite solid intrahepatic mass identified on this noncontrast examination. Cholecystectomy has been performed. No intra or extrahepatic biliary ductal dilation. Pancreas: Unremarkable Spleen: Unremarkable Adrenals/Urinary Tract: The adrenal glands are unremarkable. The kidneys are normal in size and position. 6 mm nonobstructing calculus noted within the lower pole of the right kidney. The kidneys are otherwise unremarkable. The bladder is unremarkable. Stomach/Bowel: There is a high-grade proximal large bowel obstruction involving the mid ascending colon with the point transition best seen axial image # 39/2 and coronal image # 87/5. 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 An underlying intrinsic neoplasm, however, is not excluded on this examination. Proximally, the small bowel is dilated and fluid-filled. Distally, the large bowel is decompressed. There is moderate sigmoid diverticulosis. No free intraperitoneal gas or fluid. Vascular/Lymphatic: Moderate atherosclerotic calcification within the abdominal aorta. No aortic aneurysm. No pathologic adenopathy within the abdomen and pelvis. Reproductive: Status post hysterectomy. No adnexal masses. Other: Tiny fat containing umbilical hernia. The rectum is unremarkable. Musculoskeletal: No acute bone abnormality. No lytic or blastic bone lesion. IMPRESSION: High-grade proximal large bowel obstruction involving the mid ascending colon. Given the extensive inflammatory changes noted on prior examination, this  may represent a post inflammatory stricture, however, correlation with endoscopy is recommended as an underlying neoplasm could appear similarly. Mild right nonobstructing nephrolithiasis. Large hiatal hernia Aortic Atherosclerosis (ICD10-I70.0). Electronically Signed   By: Fidela Salisbury MD   On: 01/26/2021 21:50   DG Chest Port 1 View  Result Date: 01/26/2021 CLINICAL DATA:  Onset abdominal pain and vomiting this morning. EXAM: PORTABLE CHEST 1 VIEW COMPARISON:  PA and lateral chest 04/08/2014. FINDINGS: Lungs clear. Heart size normal. Hiatal hernia again seen. No pneumothorax or pleural fluid. No acute or focal bony abnormality. IMPRESSION: No acute disease. Hiatal hernia. Electronically Signed   By: Inge Rise M.D.   On: 01/26/2021 20:00   DG Abd Portable 1 View  Result Date: 01/26/2021 CLINICAL DATA:  Onset abdominal pain and vomiting this morning. EXAM: PORTABLE ABDOMEN - 1 VIEW COMPARISON:  None. FINDINGS: Gas-filled loops of small bowel are dilated up to approximately 4.3 cm. There is some gas in the ascending colon. No unexpected abdominal calcification is seen. No acute bony abnormality. IMPRESSION: Dilated loops of small bowel with gas in the colon worrisome for early or partial small bowel obstruction. Electronically Signed   By: Inge Rise M.D.   On: 01/26/2021 20:02    Pending Labs Unresulted Labs (From admission, onward)          Start     Ordered   01/27/21 7017  Basic metabolic panel  Tomorrow morning,   R        01/26/21 2358   01/27/21 0500  CBC  Tomorrow morning,   R        01/26/21 2358   01/27/21 0000  Hemoglobin A1c  Once,   STAT       Comments: To assess prior glycemic control    01/26/21  2359          Vitals/Pain Today's Vitals   01/26/21 2345 01/27/21 0000 01/27/21 0015 01/27/21 0100  BP: 124/64 118/65 120/90 (!) 146/91  Pulse: (!) 103 99 (!) 106 (!) 123  Resp: (!) 25 (!) 22 20 (!) 22  Temp:      TempSrc:      SpO2: 92% 91% 94% 93%  Weight:       Height:      PainSc:        Isolation Precautions Airborne and Contact precautions  Medications Medications  0.9 %  sodium chloride infusion (has no administration in time range)  insulin aspart (novoLOG) injection 0-9 Units (has no administration in time range)  ondansetron (ZOFRAN) injection 4 mg (4 mg Intravenous Given 01/26/21 1959)  sodium chloride 0.9 % bolus 1,000 mL (0 mLs Intravenous Stopped 01/26/21 2130)  lactated ringers bolus 1,000 mL (0 mLs Intravenous Stopped 01/26/21 2343)  LORazepam (ATIVAN) injection 0.5 mg (0.5 mg Intravenous Given 01/27/21 0038)  Benzocaine (HURRCAINE) 20 % mouth spray ( Mouth/Throat Given 01/27/21 0105)    Mobility Bedside commode with assistance

## 2021-01-27 NOTE — Consult Note (Signed)
CC: Abdominal cramps, nausea  Requesting provider: Martinique Robinson PA-C  HPI: Angelica [name] is an 80 y.o. female hx anemia, celiac, COPD on prn O2, remote thyroid ca - presented to ed with intractable nausea/vomiting x18 hrs. She had abdominal cramps associated with this. Since being here, she reports her symptoms have completely resolved including bloating, nausea and vomiting. Denies any abdominal pain at present.   She had a relatively normal colonoscopy 02/2015 with Dr. Oletta Lamas  CT A/P for similar symptoms and a 40 lb wt loss 12/2020 showed thickening of the cecum and asc colon as well as terminal ileum.  She reports a multi month hx of similar symptoms - often short lived after a few hrs. This episode seemed longer.  Past Medical History:  Diagnosis Date  . Anemia   . Arthritis   . Asthma   . Celiac disease 11/17/2013  . COPD (chronic obstructive pulmonary disease) (HCC)    on 2L prn home O2  . Hypertension   . Rectal bleed 10/23/2018  . Thyroid cancer ([place])    remote    Past Surgical History:  Procedure Laterality Date  . ABDOMINAL HYSTERECTOMY    . BREAST EXCISIONAL BIOPSY Bilateral   . CHOLECYSTECTOMY    . COLONOSCOPY  07/05/2012   Procedure: COLONOSCOPY;  Surgeon: Winfield Cunas., MD;  Location: [facility] ENDOSCOPY;  Service: Endoscopy;  Laterality: N/A;  . COLONOSCOPY N/A 03/12/2015   Procedure: COLONOSCOPY;  Surgeon: Laurence Spates, MD;  Location: [facility] ENDOSCOPY;  Service: Endoscopy;  Laterality: N/A;  . ESOPHAGOGASTRODUODENOSCOPY  07/05/2012   Procedure: ESOPHAGOGASTRODUODENOSCOPY (EGD);  Surgeon: Winfield Cunas., MD;  Location: [facility] ENDOSCOPY;  Service: Endoscopy;  Laterality: N/A;  . HOT HEMOSTASIS N/A 03/12/2015   Procedure: HOT HEMOSTASIS (ARGON PLASMA COAGULATION/BICAP);  Surgeon: Laurence Spates, MD;  Location: [facility] ENDOSCOPY;  Service: Endoscopy;  Laterality: N/A;  . TONSILLECTOMY      Family History  Problem Relation Age of Onset  . Heart disease Mother   .  Kidney failure Father   . Cancer Sister        thyroid cancer    Social:  reports that she quit smoking about 12 years ago. Her smoking use included cigarettes. She has a 45.00 pack-year smoking history. She has never used smokeless tobacco. She reports that she does not drink alcohol and does not use drugs.  Allergies:  Allergies  Allergen Reactions  . Other Hives    Pt states she has several other allergies but can't recall the names, pt recalled the medication to be codeine.  . Fosamax [Alendronate Sodium]   . Penicillins Other (See Comments)    Unknown Did it involve swelling of the face/tongue/throat, SOB, or low BP? Unknown Did it involve sudden or severe rash/hives, skin peeling, or any reaction on the inside of your mouth or nose? Unknown Did you need to seek medical attention at a hospital or doctor's office? Unknown When did it last happen? If all above answers are "NO", may proceed with cephalosporin use.  . Sulfa Antibiotics     Medications: I have reviewed the patient's current medications.  Results for orders placed or performed during the hospital encounter of 01/26/21 (from the past 48 hour(s))  Comprehensive metabolic panel     Status: Abnormal   Collection Time: 01/26/21  6:30 PM  Result Value Ref Range   Sodium 140 135 - 145 mmol/L   Potassium 3.3 (L) 3.5 - 5.1 mmol/L   Chloride 101 98 - 111 mmol/L  CO2 23 22 - 32 mmol/L   Glucose, Bld 207 (H) 70 - 99 mg/dL    Comment: Glucose reference range applies only to samples taken after fasting for at least 8 hours.   BUN 26 (H) 8 - 23 mg/dL   Creatinine, Ser 1.59 (H) 0.44 - 1.00 mg/dL   Calcium 10.1 8.9 - 10.3 mg/dL   Total Protein 8.5 (H) 6.5 - 8.1 g/dL   Albumin 4.6 3.5 - 5.0 g/dL   AST 37 15 - 41 U/L   ALT 29 0 - 44 U/L   Alkaline Phosphatase 59 38 - 126 U/L   Total Bilirubin 1.4 (H) 0.3 - 1.2 mg/dL   GFR, Estimated 32 (L) >60 mL/min    Comment: (NOTE) Calculated using the CKD-EPI Creatinine Equation  (2021)    Anion gap 16 (H) 5 - 15    Comment: Performed at [facility], [place] [address] [place], [place] [address]  CBC with Differential     Status: Abnormal   Collection Time: 01/26/21  6:30 PM  Result Value Ref Range   WBC 14.7 (H) 4.0 - 10.5 K/uL   RBC 5.83 (H) 3.87 - 5.11 MIL/uL   Hemoglobin 16.3 (H) 12.0 - 15.0 g/dL   HCT 50.2 (H) 36.0 - 46.0 %   MCV 86.1 80.0 - 100.0 fL   MCH 28.0 26.0 - 34.0 pg   MCHC 32.5 30.0 - 36.0 g/dL   RDW 14.2 11.5 - 15.5 %   Platelets 353 150 - 400 K/uL   nRBC 0.0 0.0 - 0.2 %   Neutrophils Relative % 86 %   Neutro Abs 12.5 (H) 1.7 - 7.7 K/uL   Lymphocytes Relative 7 %   Lymphs Abs 1.1 0.7 - 4.0 K/uL   Monocytes Relative 6 %   Monocytes Absolute 0.9 0.1 - 1.0 K/uL   Eosinophils Relative 0 %   Eosinophils Absolute 0.0 0.0 - 0.5 K/uL   Basophils Relative 0 %   Basophils Absolute 0.0 0.0 - 0.1 K/uL   Immature Granulocytes 1 %   Abs Immature Granulocytes 0.08 (H) 0.00 - 0.07 K/uL    Comment: Performed at [facility], [place] [address], [place], [place] [address]  Lipase, blood     Status: None   Collection Time: 01/26/21  6:30 PM  Result Value Ref Range   Lipase 35 11 - 51 U/L    Comment: Performed at [facility], [place] [address], [place], [place] [address]  Lactic acid, plasma     Status: Abnormal   Collection Time: 01/26/21  7:00 PM  Result Value Ref Range   Lactic Acid, Venous 4.1 (HH) 0.5 - 1.9 mmol/L    Comment: CRITICAL RESULT CALLED TO, READ BACK BY AND VERIFIED WITH: Larene Beach, RN @ 2017 ON 01/26/21 Sandy Salaam Performed at [facility], [place] [address], [place], [place] [address]   Lactic acid, plasma     Status: Abnormal   Collection Time: 01/26/21  8:44 PM  Result Value Ref Range   Lactic Acid, Venous 2.6 (HH) 0.5 - 1.9 mmol/L    Comment: CRITICAL VALUE NOTED.  VALUE IS CONSISTENT WITH PREVIOUSLY REPORTED AND CALLED VALUE. Performed at [facility], [place] [address], [place], [place] [address]   Resp Panel by RT-PCR (Flu A&B, Covid) Nasopharyngeal Swab     Status: None   Collection Time: 01/26/21 10:54 PM   Specimen: Nasopharyngeal Swab; Nasopharyngeal(NP) swabs in vial transport medium  Result  Value Ref Range   SARS Coronavirus 2 by RT PCR NEGATIVE NEGATIVE    Comment: (NOTE) SARS-CoV-2 target nucleic acids are NOT DETECTED.  The SARS-CoV-2 RNA is generally detectable in upper respiratory specimens during the acute phase of infection. The lowest concentration of SARS-CoV-2 viral copies this assay can detect is 138 copies/mL. A negative result does not preclude SARS-Cov-2 infection and should not be used as the sole basis for treatment or other patient management decisions. A negative result may occur with  improper specimen collection/handling, submission of specimen other than nasopharyngeal swab, presence of viral mutation(s) within the areas targeted by this assay, and inadequate number of viral copies(<138 copies/mL). A negative result must be combined with clinical observations, patient history, and epidemiological information. The expected result is Negative.  Fact Sheet for Patients:  EntrepreneurPulse.com.au  Fact Sheet for Healthcare Providers:  IncredibleEmployment.be  This test is no t yet approved or cleared by the [place] FDA and  has been authorized for detection and/or diagnosis of SARS-CoV-2 by FDA under an Emergency Use Authorization (EUA). This EUA will remain  in effect (meaning this test can be used) for the duration of the COVID-19 declaration under Section 564(b)(1) of the Act, 21 U.S.C.section 360bbb-3(b)(1), unless the authorization is terminated  or revoked sooner.       Influenza A by PCR NEGATIVE NEGATIVE   Influenza B by PCR NEGATIVE NEGATIVE    Comment: (NOTE) The Xpert Xpress SARS-CoV-2/FLU/RSV plus assay is intended as an aid in the  diagnosis of influenza from Nasopharyngeal swab specimens and should not be used as a sole basis for treatment. Nasal washings and aspirates are unacceptable for Xpert Xpress SARS-CoV-2/FLU/RSV testing.  Fact Sheet for Patients: EntrepreneurPulse.com.au  Fact Sheet for Healthcare Providers: IncredibleEmployment.be  This test is not yet approved or cleared by the [place] FDA and has been authorized for detection and/or diagnosis of SARS-CoV-2 by FDA under an Emergency Use Authorization (EUA). This EUA will remain in effect (meaning this test can be used) for the duration of the COVID-19 declaration under Section 564(b)(1) of the Act, 21 U.S.C. section 360bbb-3(b)(1), unless the authorization is terminated or revoked.  Performed at [facility], [place] [address] [place], [place] [address]   Urinalysis, Routine w reflex microscopic Urine, Clean Catch     Status: Abnormal   Collection Time: 01/26/21 11:00 PM  Result Value Ref Range   Color, Urine YELLOW YELLOW   APPearance CLOUDY (A) CLEAR   Specific Gravity, Urine 1.019 1.005 - 1.030   pH 5.0 5.0 - 8.0   Glucose, UA NEGATIVE NEGATIVE mg/dL   Hgb urine dipstick NEGATIVE NEGATIVE   Bilirubin Urine NEGATIVE NEGATIVE   Ketones, ur 5 (A) NEGATIVE mg/dL   Protein, ur 100 (A) NEGATIVE mg/dL   Nitrite NEGATIVE NEGATIVE   Leukocytes,Ua NEGATIVE NEGATIVE   RBC / HPF 0-5 0 - 5 RBC/hpf   WBC, UA 11-20 0 - 5 WBC/hpf   Bacteria, UA RARE (A) NONE SEEN   Squamous Epithelial / LPF 0-5 0 - 5   WBC Clumps PRESENT    Mucus PRESENT    Hyaline Casts, UA PRESENT     Comment: Performed at [facility], [place] [address], [place], [place] [address]    CT Abdomen Pelvis Wo Contrast  Result Date: 01/26/2021 CLINICAL DATA:  Unspecified abdominal pain, vomiting EXAM: CT ABDOMEN AND PELVIS WITHOUT CONTRAST TECHNIQUE: Multidetector CT imaging of the abdomen and pelvis was  performed following the standard protocol  without IV contrast. COMPARISON:  01/06/2021 FINDINGS: Lower chest: Large hiatal hernia is present which appears fluid-filled. The visualized lung bases are clear. The visualized heart and pericardium are unremarkable. Hepatobiliary: Stable cyst within the left hepatic lobe. No definite solid intrahepatic mass identified on this noncontrast examination. Cholecystectomy has been performed. No intra or extrahepatic biliary ductal dilation. Pancreas: Unremarkable Spleen: Unremarkable Adrenals/Urinary Tract: The adrenal glands are unremarkable. The kidneys are normal in size and position. 6 mm nonobstructing calculus noted within the lower pole of the right kidney. The kidneys are otherwise unremarkable. The bladder is unremarkable. Stomach/Bowel: There is a high-grade proximal large bowel obstruction involving the mid ascending colon with the point transition best seen axial image # 39/2 and coronal image # 87/5. 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 An underlying intrinsic neoplasm, however, is not excluded on this examination. Proximally, the small bowel is dilated and fluid-filled. Distally, the large bowel is decompressed. There is moderate sigmoid diverticulosis. No free intraperitoneal gas or fluid. Vascular/Lymphatic: Moderate atherosclerotic calcification within the abdominal aorta. No aortic aneurysm. No pathologic adenopathy within the abdomen and pelvis. Reproductive: Status post hysterectomy. No adnexal masses. Other: Tiny fat containing umbilical hernia. The rectum is unremarkable. Musculoskeletal: No acute bone abnormality. No lytic or blastic bone lesion. IMPRESSION: High-grade proximal large bowel obstruction involving the mid ascending colon. 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 Mild right nonobstructing nephrolithiasis. Large hiatal hernia Aortic Atherosclerosis (ICD10-I70.0). Electronically Signed   By: Fidela Salisbury MD   On: 01/26/2021 21:50   DG Chest Port 1 View  Result Date: 01/26/2021 CLINICAL DATA:  Onset abdominal pain and vomiting this morning. EXAM: PORTABLE CHEST 1 VIEW COMPARISON:  PA and lateral chest 04/08/2014. FINDINGS: Lungs clear. Heart size normal. Hiatal hernia again seen. No pneumothorax or pleural fluid. No acute or focal bony abnormality. IMPRESSION: No acute disease. Hiatal hernia. Electronically Signed   By: Inge Rise M.D.   On: 01/26/2021 20:00   DG Abd Portable 1 View  Result Date: 01/26/2021 CLINICAL DATA:  Onset abdominal pain and vomiting this morning. EXAM: PORTABLE ABDOMEN - 1 VIEW COMPARISON:  None. FINDINGS: Gas-filled loops of small bowel are dilated up to approximately 4.3 cm. There is some gas in the ascending colon. No unexpected abdominal calcification is seen. No acute bony abnormality. IMPRESSION: Dilated loops of small bowel with gas in the colon worrisome for early or partial small bowel obstruction. Electronically Signed   By: Inge Rise M.D.   On: 01/26/2021 20:02    ROS - all of the below systems have been reviewed with the patient and positives are indicated with bold text General: chills, fever or night sweats Eyes: blurry vision or double vision ENT: epistaxis or sore throat Allergy/Immunology: itchy/watery eyes or nasal congestion Hematologic/Lymphatic: bleeding problems, blood clots or swollen lymph nodes Endocrine: temperature intolerance or unexpected weight changes Breast: new or changing breast lumps or nipple discharge Resp: cough, shortness of breath, or wheezing CV: chest pain or dyspnea on exertion GI: as per HPI GU: dysuria, trouble voiding, or hematuria MSK: joint pain or joint stiffness Neuro: TIA or stroke  symptoms Derm: pruritus and skin lesion changes Psych: anxiety and depression  PE Blood pressure 124/64, pulse (!) 103, temperature 99.1 F (37.3 C), resp. rate (!) 25, height 5' 3"  (1.6 m), weight  81.6 kg, SpO2 92 %. Constitutional: NAD; conversant; no deformities Eyes: Moist conjunctiva; no lid lag; anicteric; pupils equal and round Neck: Trachea midline; no thyromegaly Lungs: Normal respiratory effort; no tactile fremitus CV: RRR; no palpable thrills; no pitting edema GI: Abd soft, nontender throughout, nondistended, no rebound nor guarding; no palpable hepatosplenomegaly MSK: Normal range of motion of extremities; no clubbing/cyanosis Psychiatric: Appropriate affect; alert and oriented x3 Lymphatic: No palpable cervical or axillary lymphadenopathy  Results for orders placed or performed during the hospital encounter of 01/26/21 (from the past 48 hour(s))  Comprehensive metabolic panel     Status: Abnormal   Collection Time: 01/26/21  6:30 PM  Result Value Ref Range   Sodium 140 135 - 145 mmol/L   Potassium 3.3 (L) 3.5 - 5.1 mmol/L   Chloride 101 98 - 111 mmol/L   CO2 23 22 - 32 mmol/L   Glucose, Bld 207 (H) 70 - 99 mg/dL    Comment: Glucose reference range applies only to samples taken after fasting for at least 8 hours.   BUN 26 (H) 8 - 23 mg/dL   Creatinine, Ser 1.59 (H) 0.44 - 1.00 mg/dL   Calcium 10.1 8.9 - 10.3 mg/dL   Total Protein 8.5 (H) 6.5 - 8.1 g/dL   Albumin 4.6 3.5 - 5.0 g/dL   AST 37 15 - 41 U/L   ALT 29 0 - 44 U/L   Alkaline Phosphatase 59 38 - 126 U/L   Total Bilirubin 1.4 (H) 0.3 - 1.2 mg/dL   GFR, Estimated 32 (L) >60 mL/min    Comment: (NOTE) Calculated using the CKD-EPI Creatinine Equation (2021)    Anion gap 16 (H) 5 - 15    Comment: Performed at [facility], [place] [address] [place], [place] [address]  CBC with Differential     Status: Abnormal   Collection Time: 01/26/21  6:30 PM  Result Value Ref Range   WBC 14.7 (H) 4.0 -  10.5 K/uL   RBC 5.83 (H) 3.87 - 5.11 MIL/uL   Hemoglobin 16.3 (H) 12.0 - 15.0 g/dL   HCT 50.2 (H) 36.0 - 46.0 %   MCV 86.1 80.0 - 100.0 fL   MCH 28.0 26.0 - 34.0 pg   MCHC 32.5 30.0 - 36.0 g/dL   RDW 14.2 11.5 - 15.5 %   Platelets 353 150 - 400 K/uL   nRBC 0.0 0.0 - 0.2 %   Neutrophils Relative % 86 %   Neutro Abs 12.5 (H) 1.7 - 7.7 K/uL   Lymphocytes Relative 7 %   Lymphs Abs 1.1 0.7 - 4.0 K/uL   Monocytes Relative 6 %   Monocytes Absolute 0.9 0.1 - 1.0 K/uL   Eosinophils Relative 0 %   Eosinophils Absolute 0.0 0.0 - 0.5 K/uL   Basophils Relative 0 %   Basophils Absolute 0.0 0.0 - 0.1 K/uL   Immature Granulocytes 1 %   Abs Immature Granulocytes 0.08 (H) 0.00 - 0.07 K/uL    Comment: Performed at [facility], [place] [address] [place], [place] [address]  Lipase, blood     Status: None   Collection Time: 01/26/21  6:30 PM  Result Value Ref Range   Lipase 35 11 - 51 U/L    Comment: Performed at [facility], [place] [address], [place], [place] [address]  Lactic acid, plasma     Status: Abnormal   Collection Time: 01/26/21  7:00 PM  Result Value Ref Range   Lactic Acid,  Venous 4.1 (HH) 0.5 - 1.9 mmol/L    Comment: CRITICAL RESULT CALLED TO, READ BACK BY AND VERIFIED WITH: Larene Beach, RN @ 2017 ON 01/26/21 Sandy Salaam Performed at [facility] [place] [address], [place], [place] [address]   Lactic acid, plasma     Status: Abnormal   Collection Time: 01/26/21  8:44 PM  Result Value Ref Range   Lactic Acid, Venous 2.6 (HH) 0.5 - 1.9 mmol/L    Comment: CRITICAL VALUE NOTED.  VALUE IS CONSISTENT WITH PREVIOUSLY REPORTED AND CALLED VALUE. Performed at [facility], [place] [address] [place] [place], [place] [address]   Resp Panel by RT-PCR (Flu A&B, Covid) Nasopharyngeal Swab     Status: None   Collection Time: 01/26/21 10:54 PM   Specimen: Nasopharyngeal Swab; Nasopharyngeal(NP) swabs in vial transport medium  Result Value Ref  Range   SARS Coronavirus 2 by RT PCR NEGATIVE NEGATIVE    Comment: (NOTE) SARS-CoV-2 target nucleic acids are NOT DETECTED.  The SARS-CoV-2 RNA is generally detectable in upper respiratory specimens during the acute phase of infection. The lowest concentration of SARS-CoV-2 viral copies this assay can detect is 138 copies/mL. A negative result does not preclude SARS-Cov-2 infection and should not be used as the sole basis for treatment or other patient management decisions. A negative result may occur with  improper specimen collection/handling, submission of specimen other than nasopharyngeal swab, presence of viral mutation(s) within the areas targeted by this assay, and inadequate number of viral copies(<138 copies/mL). A negative result must be combined with clinical observations, patient history, and epidemiological information. The expected result is Negative.  Fact Sheet for Patients:  EntrepreneurPulse.com.au  Fact Sheet for Healthcare Providers:  IncredibleEmployment.be  This test is no t yet approved or cleared by the [place] FDA and  has been authorized for detection and/or diagnosis of SARS-CoV-2 by FDA under an Emergency Use Authorization (EUA). This EUA will remain  in effect (meaning this test can be used) for the duration of the COVID-19 declaration under Section 564(b)(1) of the Act, 21 U.S.C.section 360bbb-3(b)(1), unless the authorization is terminated  or revoked sooner.       Influenza A by PCR NEGATIVE NEGATIVE   Influenza B by PCR NEGATIVE NEGATIVE    Comment: (NOTE) The Xpert Xpress SARS-CoV-2/FLU/RSV plus assay is intended as an aid in the diagnosis of influenza from Nasopharyngeal swab specimens and should not be used as a sole basis for treatment. Nasal washings and aspirates are unacceptable for Xpert Xpress SARS-CoV-2/FLU/RSV testing.  Fact Sheet for  Patients: EntrepreneurPulse.com.au  Fact Sheet for Healthcare Providers: IncredibleEmployment.be  This test is not yet approved or cleared by the [place] FDA and has been authorized for detection and/or diagnosis of SARS-CoV-2 by FDA under an Emergency Use Authorization (EUA). This EUA will remain in effect (meaning this test can be used) for the duration of the COVID-19 declaration under Section 564(b)(1) of the Act, 21 U.S.C. section 360bbb-3(b)(1), unless the authorization is terminated or revoked.  Performed at [facility], [place] [address], [place], [place] [address]   Urinalysis, Routine w reflex microscopic Urine, Clean Catch     Status: Abnormal   Collection Time: 01/26/21 11:00 PM  Result Value Ref Range   Color, Urine YELLOW YELLOW   APPearance CLOUDY (A) CLEAR   Specific Gravity, Urine 1.019 1.005 - 1.030   pH 5.0 5.0 - 8.0   Glucose, UA NEGATIVE NEGATIVE mg/dL   Hgb urine dipstick NEGATIVE NEGATIVE  Bilirubin Urine NEGATIVE NEGATIVE   Ketones, ur 5 (A) NEGATIVE mg/dL   Protein, ur 100 (A) NEGATIVE mg/dL   Nitrite NEGATIVE NEGATIVE   Leukocytes,Ua NEGATIVE NEGATIVE   RBC / HPF 0-5 0 - 5 RBC/hpf   WBC, UA 11-20 0 - 5 WBC/hpf   Bacteria, UA RARE (A) NONE SEEN   Squamous Epithelial / LPF 0-5 0 - 5   WBC Clumps PRESENT    Mucus PRESENT    Hyaline Casts, UA PRESENT     Comment: Performed at [facility], [place] [address] [place], [place] [address]    CT Abdomen Pelvis Wo Contrast  Result Date: 01/26/2021 CLINICAL DATA:  Unspecified abdominal pain, vomiting EXAM: CT ABDOMEN AND PELVIS WITHOUT CONTRAST TECHNIQUE: Multidetector CT imaging of the abdomen and pelvis was performed following the standard protocol without IV contrast. COMPARISON:  01/06/2021 FINDINGS: Lower chest: Large hiatal hernia is present which appears fluid-filled. The visualized lung bases are clear. The visualized heart and  pericardium are unremarkable. Hepatobiliary: Stable cyst within the left hepatic lobe. No definite solid intrahepatic mass identified on this noncontrast examination. Cholecystectomy has been performed. No intra or extrahepatic biliary ductal dilation. Pancreas: Unremarkable Spleen: Unremarkable Adrenals/Urinary Tract: The adrenal glands are unremarkable. The kidneys are normal in size and position. 6 mm nonobstructing calculus noted within the lower pole of the right kidney. The kidneys are otherwise unremarkable. The bladder is unremarkable. Stomach/Bowel: There is a high-grade proximal large bowel obstruction involving the mid ascending colon with the point transition best seen axial image # 39/2 and coronal image # 87/5. 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 An underlying intrinsic neoplasm, however, is not excluded on this examination. Proximally, the small bowel is dilated and fluid-filled. Distally, the large bowel is decompressed. There is moderate sigmoid diverticulosis. No free intraperitoneal gas or fluid. Vascular/Lymphatic: Moderate atherosclerotic calcification within the abdominal aorta. No aortic aneurysm. No pathologic adenopathy within the abdomen and pelvis. Reproductive: Status post hysterectomy. No adnexal masses. Other: Tiny fat containing umbilical hernia. The rectum is unremarkable. Musculoskeletal: No acute bone abnormality. No lytic or blastic bone lesion. IMPRESSION: High-grade proximal large bowel obstruction involving the mid ascending colon. 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 Mild right nonobstructing nephrolithiasis. Large hiatal hernia Aortic Atherosclerosis (ICD10-I70.0). Electronically Signed   By: Fidela Salisbury MD   On:  01/26/2021 21:50   DG Chest Port 1 View  Result Date: 01/26/2021 CLINICAL DATA:  Onset abdominal pain and vomiting this morning. EXAM: PORTABLE CHEST 1 VIEW COMPARISON:  PA and lateral chest 04/08/2014. FINDINGS: Lungs clear. Heart size normal. Hiatal hernia again seen. No pneumothorax or pleural fluid. No acute or focal bony abnormality. IMPRESSION: No acute disease. Hiatal hernia. Electronically Signed   By: Inge Rise M.D.   On: 01/26/2021 20:00   DG Abd Portable 1 View  Result Date: 01/26/2021 CLINICAL DATA:  Onset abdominal pain and vomiting this morning. EXAM: PORTABLE ABDOMEN - 1 VIEW COMPARISON:  None. FINDINGS: Gas-filled loops of small bowel are dilated up to approximately 4.3 cm. There is some gas in the ascending colon. No unexpected abdominal calcification is seen. No acute bony abnormality. IMPRESSION: Dilated loops of small bowel with gas in the colon worrisome for early or partial small bowel obstruction. Electronically Signed   By: Inge Rise M.D.  On: 01/26/2021 20:02     A/P: Angelica [name] is an 80 y.o. female with apparent narrowing in proximal ascending colon - following inflammatory changes in TI, cecum, asc colon 1 mo prior  -I have reviewed her CT. 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 She would potentially benefit from NPO, NGT and IVF -Her symptoms have again resolved and she feels 'normal.' Etiology is unclear at this point. I think given frequency of this and inability to go more than a week or so without recurrence, admission for further workup/treatment is appropriate -Recommend GI consult for potential endoscopic evaluation as well -We will follow with you  Nadeen Landau, MD [facility] Use [email or web address] to contact on call provider

## 2021-01-27 NOTE — Progress Notes (Signed)
PROGRESS NOTE    Angelica [name]  ZOX:[id number] DOB: 1941-01-14 DOA: 01/26/2021 PCP: Velna Hatchet, MD   Brief Narrative:  Angelica [name] is a 80 y.o. female with medical history significant for remote hx of thyroid cancer, celiac disease, rectal bleed, COPD, HTN, iron deficiency anemia who presents with abdominal pain, bloating and persistent nausea and vomiting. Patient recently had CT of the abdomen done 4/14 showing abnormal thickening and luminal narrowing of the cecum and ascending colon with differential including carcinoid, IBD or focal colitis.  She had plan colonoscopy with GI in June however presents today given worsening symptoms. In ED: CT showing high-grade proximal large bowel obstruction involving the mid ascending colon. Patient was initially hypotensive with tachycardic tachypneic.  States her symptoms have largely resolved. Surgery Dr. Dema Severin has evaluated patient at bedside and recommends NG tube placement and GI consult.    Assessment & Plan:   Principal Problem:   Large bowel obstruction (HCC) Active Problems:   Hypokalemia   COPD (chronic obstructive pulmonary disease)- on nocturnal oxygen at home   Acquired hypothyroidism   Hypotension   Hyperglycemia   Large bowel obstruction -General surgery, GI following, appreciate insight and recommendations -NG tube to low intermittent suction ongoing -Continue IV fluids in the setting of n.p.o. status -Possible need for repeat imaging per specialists as above -Abdominal pain markedly improving after initiation of suction  SIRs criteria without sepsis; likely reactive secondary to above, POA Lactic acidosis - Presented with tachycardia, tachypnea and has leukocytosis but no clear source - Resolved with supportive care and pain control confirming likely reactive status -Continue IV fluids in the setting of likely poor p.o. intake dehydration secondary to above  Mild Hypokalemia -Repleted, follow morning  labs  Hyperglycemia -Obtain hemoglobin A1c -Sensitive sliding scale  Hypotension -In the setting of poor p.o. intake and profound dehydration secondary to above -Continue IV fluids  Hypothyroidism w/ remote history of thyroid cancer -Continue levothyroxine IV  COPD - PRN O2 at night at baseline   DVT prophylaxis: SCDs Code Status: Full Family Communication: None present  Status is: Inpatient  Dispo: The patient is from: Home              Anticipated d/c is to: Home              Anticipated d/c date is: > 72 hours              Patient currently not medically stable for discharge  Consultants:   GI, general surgery  Procedures:   None planned  Antimicrobials:  None indicated  Subjective: No acute issues or events overnight denies chest pain, shortness of breath, diarrhea, headache, fevers, chills.  Nausea vomiting appear to be resolving.  Patient also notes moderate amount of flatus overnight but no bowel movement.  Objective: Vitals:   01/27/21 0145 01/27/21 0220 01/27/21 0243 01/27/21 0518  BP: 116/65  129/81 120/62  Pulse: (!) 108 (!) 111 (!) 109 94  Resp: 18 (!) 23 19 18   Temp:  98.1 F (36.7 C) 99.4 F (37.4 C) 98.3 F (36.8 C)  TempSrc:  Oral Oral Oral  SpO2: 93% 96% 96% 91%  Weight:      Height:        Intake/Output Summary (Last 24 hours) at 01/27/2021 0703 Last data filed at 01/27/2021 8119 Gross per 24 hour  Intake 2205.25 ml  Output --  Net 2205.25 ml   Filed Weights   01/26/21 1838  Weight: 81.6 kg  Examination:  General:  Pleasantly resting in bed, No acute distress. HEENT:  Normocephalic atraumatic.  Sclerae nonicteric, noninjected.  Extraocular movements intact bilaterally.  NG tube currently not connected to suction Neck:  Without mass or deformity.  Trachea is midline. Lungs:  Clear to auscultate bilaterally without rhonchi, wheeze, or rales. Heart:  Regular rate and rhythm.  Without murmurs, rubs, or gallops. Abdomen:   Soft, moderately tender diffusely, nondistended.  Without guarding or rebound. Extremities: Without cyanosis, clubbing, edema, or obvious deformity. Vascular:  Dorsalis pedis and posterior tibial pulses palpable bilaterally. Skin:  Warm and dry, no erythema, no ulcerations.   Data Reviewed: I have personally reviewed following labs and imaging studies  CBC: Recent Labs  Lab 01/26/21 1830 01/27/21 0433  WBC 14.7* 8.7  NEUTROABS 12.5*  --   HGB 16.3* 13.1  HCT 50.2* 41.2  MCV 86.1 89.6  PLT 353 619   Basic Metabolic Panel: Recent Labs  Lab 01/26/21 1830 01/27/21 0433  NA 140 145  K 3.3* 3.2*  CL 101 109  CO2 23 25  GLUCOSE 207* 126*  BUN 26* 27*  CREATININE 1.59* 1.01*  CALCIUM 10.1 8.7*   GFR: Estimated Creatinine Clearance: 44.2 mL/min (A) (by C-G formula based on SCr of 1.01 mg/dL (H)). Liver Function Tests: Recent Labs  Lab 01/26/21 1830  AST 37  ALT 29  ALKPHOS 59  BILITOT 1.4*  PROT 8.5*  ALBUMIN 4.6   Recent Labs  Lab 01/26/21 1830  LIPASE 35   No results for input(s): AMMONIA in the last 168 hours. Coagulation Profile: No results for input(s): INR, PROTIME in the last 168 hours. Cardiac Enzymes: No results for input(s): CKTOTAL, CKMB, CKMBINDEX, TROPONINI in the last 168 hours. BNP (last 3 results) No results for input(s): PROBNP in the last 8760 hours. HbA1C: No results for input(s): HGBA1C in the last 72 hours. CBG: Recent Labs  Lab 01/27/21 0143  GLUCAP 123*   Lipid Profile: No results for input(s): CHOL, HDL, LDLCALC, TRIG, CHOLHDL, LDLDIRECT in the last 72 hours. Thyroid Function Tests: No results for input(s): TSH, T4TOTAL, FREET4, T3FREE, THYROIDAB in the last 72 hours. Anemia Panel: No results for input(s): VITAMINB12, FOLATE, FERRITIN, TIBC, IRON, RETICCTPCT in the last 72 hours. Sepsis Labs: Recent Labs  Lab 01/26/21 1900 01/26/21 2044 01/27/21 0433  LATICACIDVEN 4.1* 2.6* 1.1    Recent Results (from the past 240  hour(s))  Resp Panel by RT-PCR (Flu A&B, Covid) Nasopharyngeal Swab     Status: None   Collection Time: 01/26/21 10:54 PM   Specimen: Nasopharyngeal Swab; Nasopharyngeal(NP) swabs in vial transport medium  Result Value Ref Range Status   SARS Coronavirus 2 by RT PCR NEGATIVE NEGATIVE Final    Comment: (NOTE) SARS-CoV-2 target nucleic acids are NOT DETECTED.  The SARS-CoV-2 RNA is generally detectable in upper respiratory specimens during the acute phase of infection. The lowest concentration of SARS-CoV-2 viral copies this assay can detect is 138 copies/mL. A negative result does not preclude SARS-Cov-2 infection and should not be used as the sole basis for treatment or other patient management decisions. A negative result may occur with  improper specimen collection/handling, submission of specimen other than nasopharyngeal swab, presence of viral mutation(s) within the areas targeted by this assay, and inadequate number of viral copies(<138 copies/mL). A negative result must be combined with clinical observations, patient history, and epidemiological information. The expected result is Negative.  Fact Sheet for Patients:  EntrepreneurPulse.com.au  Fact Sheet for Healthcare Providers:  IncredibleEmployment.be  This test is no t yet approved or cleared by the [place] and  has been authorized for detection and/or diagnosis of SARS-CoV-2 by FDA under an Emergency Use Authorization (EUA). This EUA will remain  in effect (meaning this test can be used) for the duration of the COVID-19 declaration under Section 564(b)(1) of the Act, 21 U.S.C.section 360bbb-3(b)(1), unless the authorization is terminated  or revoked sooner.       Influenza A by PCR NEGATIVE NEGATIVE Final   Influenza B by PCR NEGATIVE NEGATIVE Final    Comment: (NOTE) The Xpert Xpress SARS-CoV-2/FLU/RSV plus assay is intended as an aid in the diagnosis of influenza from  Nasopharyngeal swab specimens and should not be used as a sole basis for treatment. Nasal washings and aspirates are unacceptable for Xpert Xpress SARS-CoV-2/FLU/RSV testing.  Fact Sheet for Patients: EntrepreneurPulse.com.au  Fact Sheet for Healthcare Providers: IncredibleEmployment.be  This test is not yet approved or cleared by the [place] FDA and has been authorized for detection and/or diagnosis of SARS-CoV-2 by FDA under an Emergency Use Authorization (EUA). This EUA will remain in effect (meaning this test can be used) for the duration of the COVID-19 declaration under Section 564(b)(1) of the Act, 21 U.S.C. section 360bbb-3(b)(1), unless the authorization is terminated or revoked.  Performed at [facility], [place] [address] [name], [place] [address]          Radiology Studies: CT Abdomen Pelvis Wo Contrast  Result Date: 01/26/2021 CLINICAL DATA:  Unspecified abdominal pain, vomiting EXAM: CT ABDOMEN AND PELVIS WITHOUT CONTRAST TECHNIQUE: Multidetector CT imaging of the abdomen and pelvis was performed following the standard protocol without IV contrast. COMPARISON:  01/06/2021 FINDINGS: Lower chest: Large hiatal hernia is present which appears fluid-filled. The visualized lung bases are clear. The visualized heart and pericardium are unremarkable. Hepatobiliary: Stable cyst within the left hepatic lobe. No definite solid intrahepatic mass identified on this noncontrast examination. Cholecystectomy has been performed. No intra or extrahepatic biliary ductal dilation. Pancreas: Unremarkable Spleen: Unremarkable Adrenals/Urinary Tract: The adrenal glands are unremarkable. The kidneys are normal in size and position. 6 mm nonobstructing calculus noted within the lower pole of the right kidney. The kidneys are otherwise unremarkable. The bladder is unremarkable. Stomach/Bowel: There is a high-grade proximal large bowel  obstruction involving the mid ascending colon with the point transition best seen axial image # 39/2 and coronal image # 87/5. 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 An underlying intrinsic neoplasm, however, is not excluded on this examination. Proximally, the small bowel is dilated and fluid-filled. Distally, the large bowel is decompressed. There is moderate sigmoid diverticulosis. No free intraperitoneal gas or fluid. Vascular/Lymphatic: Moderate atherosclerotic calcification within the abdominal aorta. No aortic aneurysm. No pathologic adenopathy within the abdomen and pelvis. Reproductive: Status post hysterectomy. No adnexal masses. Other: Tiny fat containing umbilical hernia. The rectum is unremarkable. Musculoskeletal: No acute bone abnormality. No lytic or blastic bone lesion. IMPRESSION: High-grade proximal large bowel obstruction involving the mid ascending colon. 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 Mild right nonobstructing nephrolithiasis. Large hiatal hernia Aortic Atherosclerosis (ICD10-I70.0). Electronically Signed   By: Fidela Salisbury MD   On: 01/26/2021 21:50   DG Abdomen 1 View  Result Date: 01/27/2021 CLINICAL DATA:  NG tube placement. EXAM: ABDOMEN - 1 VIEW COMPARISON:  Jan 26, 2021 FINDINGS: A nasogastric tube is seen with its distal end looped upon itself, overlying the medial aspect of the right lung base and adjacent portion of the thoracic spine. This is likely within the patient's known large gastric hernia. The bowel gas pattern is normal. Radiopaque surgical clips are seen overlying the right upper quadrant. No radio-opaque calculi or other significant radiographic abnormality are seen. IMPRESSION: Nasogastric tube positioning, as  described above Electronically Signed   By: Virgina Norfolk M.D.   On: 01/27/2021 02:26   DG Chest Port 1 View  Result Date: 01/26/2021 CLINICAL DATA:  Onset abdominal pain and vomiting this morning. EXAM: PORTABLE CHEST 1 VIEW COMPARISON:  PA and lateral chest 04/08/2014. FINDINGS: Lungs clear. Heart size normal. Hiatal hernia again seen. No pneumothorax or pleural fluid. No acute or focal bony abnormality. IMPRESSION: No acute disease. Hiatal hernia. Electronically Signed   By: Inge Rise M.D.   On: 01/26/2021 20:00   DG Abd Portable 1 View  Result Date: 01/26/2021 CLINICAL DATA:  Onset abdominal pain and vomiting this morning. EXAM: PORTABLE ABDOMEN - 1 VIEW COMPARISON:  None. FINDINGS: Gas-filled loops of small bowel are dilated up to approximately 4.3 cm. There is some gas in the ascending colon. No unexpected abdominal calcification is seen. No acute bony abnormality. IMPRESSION: Dilated loops of small bowel with gas in the colon worrisome for early or partial small bowel obstruction. Electronically Signed   By: Inge Rise M.D.   On: 01/26/2021 20:02     Scheduled Meds: . insulin aspart  0-9 Units Subcutaneous TID WC  . levothyroxine  125 mcg Oral Daily  . pantoprazole  40 mg Oral Daily   Continuous Infusions: . sodium chloride 75 mL/hr at 01/27/21 0314     LOS: 1 day    Time spent: 77mn    Garrell Flagg C Lissy Deuser, DO [facility]  If 7PM-7AM, please contact night-coverage [email or web address]  01/27/2021, 7:03 AM

## 2021-01-28 DIAGNOSIS — K56609 Unspecified intestinal obstruction, unspecified as to partial versus complete obstruction: Secondary | ICD-10-CM | POA: Diagnosis not present

## 2021-01-28 LAB — COMPREHENSIVE METABOLIC PANEL WITH GFR
ALT: 15 U/L (ref 0–44)
AST: 16 U/L (ref 15–41)
Albumin: 3.3 g/dL — ABNORMAL LOW (ref 3.5–5.0)
Alkaline Phosphatase: 48 U/L (ref 38–126)
Anion gap: 9 (ref 5–15)
BUN: 25 mg/dL — ABNORMAL HIGH (ref 8–23)
CO2: 27 mmol/L (ref 22–32)
Calcium: 8.6 mg/dL — ABNORMAL LOW (ref 8.9–10.3)
Chloride: 112 mmol/L — ABNORMAL HIGH (ref 98–111)
Creatinine, Ser: 0.75 mg/dL (ref 0.44–1.00)
GFR, Estimated: >60 mL/min
Glucose, Bld: 90 mg/dL (ref 70–99)
Potassium: 3.1 mmol/L — ABNORMAL LOW (ref 3.5–5.1)
Sodium: 148 mmol/L — ABNORMAL HIGH (ref 135–145)
Total Bilirubin: 1.2 mg/dL (ref 0.3–1.2)
Total Protein: 6 g/dL — ABNORMAL LOW (ref 6.5–8.1)

## 2021-01-28 LAB — CBC
HCT: 41.6 % (ref 36.0–46.0)
Hemoglobin: 13 g/dL (ref 12.0–15.0)
MCH: 28.4 pg (ref 26.0–34.0)
MCHC: 31.3 g/dL (ref 30.0–36.0)
MCV: 91 fL (ref 80.0–100.0)
Platelets: 243 10*3/uL (ref 150–400)
RBC: 4.57 MIL/uL (ref 3.87–5.11)
RDW: 14.6 % (ref 11.5–15.5)
WBC: 7.7 10*3/uL (ref 4.0–10.5)
nRBC: 0 % (ref 0.0–0.2)

## 2021-01-28 LAB — GLUCOSE, CAPILLARY
Glucose-Capillary: 84 mg/dL (ref 70–99)
Glucose-Capillary: 86 mg/dL (ref 70–99)
Glucose-Capillary: 81 mg/dL (ref 70–99)
Glucose-Capillary: 82 mg/dL (ref 70–99)
Glucose-Capillary: 76 mg/dL (ref 70–99)

## 2021-01-28 LAB — PROCALCITONIN: Procalcitonin: <0.1 ng/mL

## 2021-01-28 MED ORDER — LACTATED RINGERS IV SOLN: INTRAVENOUS | Status: DC

## 2021-01-28 MED ORDER — LIP MEDEX EX OINT
TOPICAL_OINTMENT | CUTANEOUS | Status: DC | PRN
  Filled 2021-01-28 (×2): qty 7

## 2021-01-28 MED ORDER — POTASSIUM CHLORIDE 2 MEQ/ML IV SOLN: INTRAVENOUS | Status: DC

## 2021-01-28 MED ORDER — BENZOCAINE 20 % MT AERO
INHALATION_SPRAY | Freq: Four times a day (QID) | OROMUCOSAL | Status: DC | PRN
  Filled 2021-01-28: qty 57

## 2021-01-28 MED ORDER — MORPHINE SULFATE (PF) 2 MG/ML IV SOLN
1.0000 mg | INTRAVENOUS | Status: DC | PRN
  Administered 2021-02-02 (×2): 2 mg via INTRAVENOUS
  Administered 2021-02-02: 1 mg via INTRAVENOUS
  Administered 2021-02-03: 2 mg via INTRAVENOUS
  Filled 2021-01-28 (×4): qty 1

## 2021-01-28 MED ORDER — POTASSIUM CHLORIDE 10 MEQ/100ML IV SOLN
10.0000 meq | INTRAVENOUS | Status: AC
  Administered 2021-01-28 (×6): 10 meq via INTRAVENOUS
  Filled 2021-01-28 (×6): qty 100

## 2021-01-28 MED ORDER — POTASSIUM CHLORIDE 10 MEQ/100ML IV SOLN
10.0000 meq | INTRAVENOUS | Status: DC
  Administered 2021-01-28 (×2): 10 meq via INTRAVENOUS
  Filled 2021-01-28: qty 100

## 2021-01-28 NOTE — Progress Notes (Signed)
PROGRESS NOTE    Angelica [name]  RSW:[id number] DOB: [birth year] DOA: 01/26/2021 PCP: Velna Hatchet, MD   Brief Narrative:  Angelica [name] is a 80 y.o. female with medical history significant for remote hx of thyroid cancer, celiac disease, rectal bleed, COPD, HTN, iron deficiency anemia who presents with abdominal pain, bloating and persistent nausea and vomiting. Patient recently had CT of the abdomen done 4/14 showing abnormal thickening and luminal narrowing of the cecum and ascending colon with differential including carcinoid, IBD or focal colitis.  She had plan colonoscopy with GI in June however presents today given worsening symptoms. In ED: CT showing high-grade proximal large bowel obstruction involving the mid ascending colon. Patient was initially hypotensive with tachycardic tachypneic.  States her symptoms have largely resolved. Surgery Dr. Dema Severin has evaluated patient at bedside and recommends NG tube placement and GI consult.    Assessment & Plan:   Principal Problem:   Large bowel obstruction (HCC) Active Problems:   Hypokalemia   COPD (chronic obstructive pulmonary disease)- on nocturnal oxygen at home   Acquired hypothyroidism   Hypotension   Hyperglycemia  Large bowel obstruction - General surgery, GI following, appreciate insight and recommendations -Tentative plan for lower endoscopy in the next 24 to 48 hours - NG tube to low intermittent suction ongoing -output nearly 3 L over the past 24 hours - Continue IV fluids in the setting of n.p.o. status - Abdominal pain markedly improving after NG tube placement  SIRs criteria without sepsis; likely reactive secondary to above, POA Lactic acidosis - Presented with tachycardia, tachypnea and has leukocytosis but no clear source - Resolved with supportive care and pain control confirming likely reactive status -Continue IV fluids in the setting of likely poor p.o. intake dehydration secondary to  above  Hypokalemia -Repleted, follow repeat labs  Hyperglycemia -Hemoglobin A1c - 6.0 -Likely reactive -Sensitive sliding scale  Hypotension -In the setting of poor p.o. intake and profound dehydration secondary to above -Continue IV fluids  Hypothyroidism w/ remote history of thyroid cancer -Continue levothyroxine IV  COPD - PRN O2 at night at baseline   DVT prophylaxis: SCDs Code Status: Full Family Communication: None present  Status is: Inpatient  Dispo: The patient is from: Home              Anticipated d/c is to: Home              Anticipated d/c date is: > 72 hours              Patient currently not medically stable for discharge  Consultants:   GI, general surgery  Procedures:   None planned  Antimicrobials:  None indicated  Subjective: No acute issues or events overnight reports marked improvement in abdominal pain and distention since NG tube placement.  Also notes bowel movement overnight.  Denies chest pain shortness of breath headache fevers or chills.  Objective: Vitals:   01/27/21 1800 01/27/21 2152 01/27/21 2300 01/28/21 0345  BP:  126/74  121/64  Pulse:  85  83  Resp:  19  (!) 22  Temp:  98.3 F (36.8 C)  98.6 F (37 C)  TempSrc:  Oral  Oral  SpO2: 92% 90% 93% 93%  Weight:      Height:        Intake/Output Summary (Last 24 hours) at 01/28/2021 0737 Last data filed at 01/28/2021 0248 Gross per 24 hour  Intake 648.85 ml  Output 1400 ml  Net -751.15 ml  Filed Weights   01/26/21 1838  Weight: 81.6 kg    Examination:  General:  Pleasantly resting in bed, No acute distress. HEENT:  Normocephalic atraumatic.  Sclerae nonicteric, noninjected.  Extraocular movements intact bilaterally.  NG tube draining dark brown liquid Neck:  Without mass or deformity.  Trachea is midline. Lungs:  Clear to auscultate bilaterally without rhonchi, wheeze, or rales. Heart:  Regular rate and rhythm.  Without murmurs, rubs, or gallops. Abdomen:   Soft, moderately tender diffusely, nondistended.  Without guarding or rebound. Extremities: Without cyanosis, clubbing, edema, or obvious deformity. Vascular:  Dorsalis pedis and posterior tibial pulses palpable bilaterally. Skin:  Warm and dry, no erythema, no ulcerations.   Data Reviewed: I have personally reviewed following labs and imaging studies  CBC: Recent Labs  Lab 01/26/21 1830 01/27/21 0433 01/28/21 0405  WBC 14.7* 8.7 7.7  NEUTROABS 12.5*  --   --   HGB 16.3* 13.1 13.0  HCT 50.2* 41.2 41.6  MCV 86.1 89.6 91.0  PLT 353 268 122   Basic Metabolic Panel: Recent Labs  Lab 01/26/21 1830 01/27/21 0433 01/28/21 0405  NA 140 145 148*  K 3.3* 3.2* 3.1*  CL 101 109 112*  CO2 23 25 27   GLUCOSE 207* 126* 90  BUN 26* 27* 25*  CREATININE 1.59* 1.01* 0.75  CALCIUM 10.1 8.7* 8.6*   GFR: Estimated Creatinine Clearance: 55.8 mL/min (by C-G formula based on SCr of 0.75 mg/dL). Liver Function Tests: Recent Labs  Lab 01/26/21 1830 01/28/21 0405  AST 37 16  ALT 29 15  ALKPHOS 59 48  BILITOT 1.4* 1.2  PROT 8.5* 6.0*  ALBUMIN 4.6 3.3*   Recent Labs  Lab 01/26/21 1830  LIPASE 35   No results for input(s): AMMONIA in the last 168 hours. Coagulation Profile: No results for input(s): INR, PROTIME in the last 168 hours. Cardiac Enzymes: No results for input(s): CKTOTAL, CKMB, CKMBINDEX, TROPONINI in the last 168 hours. BNP (last 3 results) No results for input(s): PROBNP in the last 8760 hours. HbA1C: Recent Labs    01/27/21 0433  HGBA1C 6.0*   CBG: Recent Labs  Lab 01/27/21 0734 01/27/21 1115 01/27/21 1628 01/27/21 2147 01/28/21 0341  GLUCAP 110* 115* 114* 85 84   Lipid Profile: No results for input(s): CHOL, HDL, LDLCALC, TRIG, CHOLHDL, LDLDIRECT in the last 72 hours. Thyroid Function Tests: No results for input(s): TSH, T4TOTAL, FREET4, T3FREE, THYROIDAB in the last 72 hours. Anemia Panel: No results for input(s): VITAMINB12, FOLATE, FERRITIN, TIBC,  IRON, RETICCTPCT in the last 72 hours. Sepsis Labs: Recent Labs  Lab 01/26/21 1900 01/26/21 2044 01/27/21 0433 01/28/21 0405  PROCALCITON  --   --  <0.10 <0.10  LATICACIDVEN 4.1* 2.6* 1.1  --     Recent Results (from the past 240 hour(s))  Resp Panel by RT-PCR (Flu A&B, Covid) Nasopharyngeal Swab     Status: None   Collection Time: 01/26/21 10:54 PM   Specimen: Nasopharyngeal Swab; Nasopharyngeal(NP) swabs in vial transport medium  Result Value Ref Range Status   SARS Coronavirus 2 by RT PCR NEGATIVE NEGATIVE Final    Comment: (NOTE) SARS-CoV-2 target nucleic acids are NOT DETECTED.  The SARS-CoV-2 RNA is generally detectable in upper respiratory specimens during the acute phase of infection. The lowest concentration of SARS-CoV-2 viral copies this assay can detect is 138 copies/mL. A negative result does not preclude SARS-Cov-2 infection and should not be used as the sole basis for treatment or other patient management decisions. A negative result  may occur with  improper specimen collection/handling, submission of specimen other than nasopharyngeal swab, presence of viral mutation(s) within the areas targeted by this assay, and inadequate number of viral copies(<138 copies/mL). A negative result must be combined with clinical observations, patient history, and epidemiological information. The expected result is Negative.  Fact Sheet for Patients:  EntrepreneurPulse.com.au  Fact Sheet for Healthcare Providers:  IncredibleEmployment.be  This test is no t yet approved or cleared by the [place] FDA and  has been authorized for detection and/or diagnosis of SARS-CoV-2 by FDA under an Emergency Use Authorization (EUA). This EUA will remain  in effect (meaning this test can be used) for the duration of the COVID-19 declaration under Section 564(b)(1) of the Act, 21 U.S.C.section 360bbb-3(b)(1), unless the authorization is terminated   or revoked sooner.       Influenza A by PCR NEGATIVE NEGATIVE Final   Influenza B by PCR NEGATIVE NEGATIVE Final    Comment: (NOTE) The Xpert Xpress SARS-CoV-2/FLU/RSV plus assay is intended as an aid in the diagnosis of influenza from Nasopharyngeal swab specimens and should not be used as a sole basis for treatment. Nasal washings and aspirates are unacceptable for Xpert Xpress SARS-CoV-2/FLU/RSV testing.  Fact Sheet for Patients: EntrepreneurPulse.com.au  Fact Sheet for Healthcare Providers: IncredibleEmployment.be  This test is not yet approved or cleared by the [place] FDA and has been authorized for detection and/or diagnosis of SARS-CoV-2 by FDA under an Emergency Use Authorization (EUA). This EUA will remain in effect (meaning this test can be used) for the duration of the COVID-19 declaration under Section 564(b)(1) of the Act, 21 U.S.C. section 360bbb-3(b)(1), unless the authorization is terminated or revoked.  Performed at [facility], [place] [address], [place], [place] [address]          Radiology Studies: CT Abdomen Pelvis Wo Contrast  Result Date: 01/26/2021 CLINICAL DATA:  Unspecified abdominal pain, vomiting EXAM: CT ABDOMEN AND PELVIS WITHOUT CONTRAST TECHNIQUE: Multidetector CT imaging of the abdomen and pelvis was performed following the standard protocol without IV contrast. COMPARISON:  01/06/2021 FINDINGS: Lower chest: Large hiatal hernia is present which appears fluid-filled. The visualized lung bases are clear. The visualized heart and pericardium are unremarkable. Hepatobiliary: Stable cyst within the left hepatic lobe. No definite solid intrahepatic mass identified on this noncontrast examination. Cholecystectomy has been performed. No intra or extrahepatic biliary ductal dilation. Pancreas: Unremarkable Spleen: Unremarkable Adrenals/Urinary Tract: The adrenal glands are unremarkable. The  kidneys are normal in size and position. 6 mm nonobstructing calculus noted within the lower pole of the right kidney. The kidneys are otherwise unremarkable. The bladder is unremarkable. Stomach/Bowel: There is a high-grade proximal large bowel obstruction involving the mid ascending colon with the point transition best seen axial image # 39/2 and coronal image # 87/5. 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 An underlying intrinsic neoplasm, however, is not excluded on this examination. Proximally, the small bowel is dilated and fluid-filled. Distally, the large bowel is decompressed. There is moderate sigmoid diverticulosis. No free intraperitoneal gas or fluid. Vascular/Lymphatic: Moderate atherosclerotic calcification within the abdominal aorta. No aortic aneurysm. No pathologic adenopathy within the abdomen and pelvis. Reproductive: Status post hysterectomy. No adnexal masses. Other: Tiny fat containing umbilical hernia. The rectum is unremarkable. Musculoskeletal: No acute bone abnormality. No lytic or blastic bone lesion. IMPRESSION: High-grade proximal large bowel obstruction involving the mid ascending colon. 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 Mild right nonobstructing nephrolithiasis. Large hiatal hernia Aortic Atherosclerosis (ICD10-I70.0). Electronically Signed   By: Fidela Salisbury MD   On: 01/26/2021 21:50   DG Abdomen 1 View  Result Date: 01/27/2021 CLINICAL DATA:  NG tube placement. EXAM: ABDOMEN - 1 VIEW COMPARISON:  Jan 26, 2021 FINDINGS: A nasogastric tube is seen with its distal end looped upon itself, overlying the medial aspect of the right lung base and adjacent portion of the thoracic spine. This is likely within the patient's known large  gastric hernia. The bowel gas pattern is normal. Radiopaque surgical clips are seen overlying the right upper quadrant. No radio-opaque calculi or other significant radiographic abnormality are seen. IMPRESSION: Nasogastric tube positioning, as described above Electronically Signed   By: Virgina Norfolk M.D.   On: 01/27/2021 02:26   DG Chest Port 1 View  Result Date: 01/26/2021 CLINICAL DATA:  Onset abdominal pain and vomiting this morning. EXAM: PORTABLE CHEST 1 VIEW COMPARISON:  PA and lateral chest 04/08/2014. FINDINGS: Lungs clear. Heart size normal. Hiatal hernia again seen. No pneumothorax or pleural fluid. No acute or focal bony abnormality. IMPRESSION: No acute disease. Hiatal hernia. Electronically Signed   By: Inge Rise M.D.   On: 01/26/2021 20:00   DG Abd Portable 1 View  Result Date: 01/26/2021 CLINICAL DATA:  Onset abdominal pain and vomiting this morning. EXAM: PORTABLE ABDOMEN - 1 VIEW COMPARISON:  None. FINDINGS: Gas-filled loops of small bowel are dilated up to approximately 4.3 cm. There is some gas in the ascending colon. No unexpected abdominal calcification is seen. No acute bony abnormality. IMPRESSION: Dilated loops of small bowel with gas in the colon worrisome for early or partial small bowel obstruction. Electronically Signed   By: Inge Rise M.D.   On: 01/26/2021 20:02   DG INTRO LONG GI TUBE  Result Date: 01/27/2021 CLINICAL DATA:  Small-bowel obstruction. Feeding tube for decompression. EXAM: FL FEEDING TUBE PLACEMENT CONTRAST:  50 mL of Omnipaque 300 through the NG tube FLUOROSCOPY TIME:  Fluoroscopy Time:  3 minutes 12 second Radiation Exposure Index (if provided by the fluoroscopic device): Number of Acquired Spot Images: 2 COMPARISON:  CT abdomen pelvis 01/26/2021 FINDINGS: Review of the prior CT reveals a large hiatal hernia and small-bowel obstruction. NG tube passes through the right nares after lubrication and anesthesia with lidocaine lubricant. NG tube  placed into the hiatal hernia. It was difficult to enter the stomach below the hernia. Addition of the Amplatz wire and injection of contrast eventually allowed the tube to pass into the stomach. Final tube position is in the antrum of the stomach. IMPRESSION: Large hiatal hernia. Feeding tube placed with the tip in the antrum of the stomach. Electronically Signed   By: Franchot Gallo M.D.   On: 01/27/2021 10:20     Scheduled Meds: . insulin aspart  0-9 Units Subcutaneous TID WC  . [START ON 01/30/2021] levothyroxine  50 mcg Intravenous Daily  . pantoprazole  40 mg Oral Daily   Continuous Infusions: . sodium chloride 75 mL/hr at 01/28/21 0719     LOS: 2 days    Time spent: 8mn    Heron Pitcock C Corrie Reder, DO [facility]  If 7PM-7AM, please contact night-coverage [email or web address]  01/28/2021, 7:37 AM

## 2021-01-28 NOTE — Progress Notes (Signed)
Removed Peripheral IV from right antecubital due to pt's IV site leaking/bleeding. Attempted to place and IV but was unsuccessful. Two nurses assessed the patient to find a placement for an IV site that would be suitable, but could not find a place. IV team was consulted.

## 2021-01-28 NOTE — Progress Notes (Signed)
Subjective: CC: Feeling better since NGT placement. Less distended. Abdominal pain has resolved. Denies nausea. Started passing flatus last night. She reports large semi-solid bm last night as well. She is getting oob to commode only.   Objective: Vital signs in last 24 hours: Temp:  [98.3 F (36.8 C)-98.6 F (37 C)] 98.6 F (37 C) (05/06 0345) Pulse Rate:  [83-99] 83 (05/06 0345) Resp:  [18-22] 22 (05/06 0345) BP: (121-126)/(64-74) 121/64 (05/06 0345) SpO2:  [87 %-93 %] 93 % (05/06 0345) Last BM Date: 01/27/21  Intake/Output from previous day: 05/05 0701 - 05/06 0700 In: 648.9 [I.V.:648.9] Out: 1400 [Urine:100; Emesis/NG output:1300] Intake/Output this shift: No intake/output data recorded.  PE: Gen:  Alert, NAD, pleasant HEENT: EOM's intact, pupils equal and round Pulm: Normal rate and effort  Abd: Soft, mild distension that she reports feels like her normal abdomen, NT, +BS, NGT in place with bilious output in cannister, 1.3L/24 hours Psych: A&Ox3  Skin: no rashes noted, warm and dry   Lab Results:  Recent Labs    01/27/21 0433 01/28/21 0405  WBC 8.7 7.7  HGB 13.1 13.0  HCT 41.2 41.6  PLT 268 243   BMET Recent Labs    01/27/21 0433 01/28/21 0405  NA 145 148*  K 3.2* 3.1*  CL 109 112*  CO2 25 27  GLUCOSE 126* 90  BUN 27* 25*  CREATININE 1.01* 0.75  CALCIUM 8.7* 8.6*   PT/INR No results for input(s): LABPROT, INR in the last 72 hours. CMP     Component Value Date/Time   NA 148 (H) 01/28/2021 0405   NA 141 11/10/2013 1449   K 3.1 (L) 01/28/2021 0405   K 3.7 11/10/2013 1449   CL 112 (H) 01/28/2021 0405   CO2 27 01/28/2021 0405   CO2 27 11/10/2013 1449   GLUCOSE 90 01/28/2021 0405   GLUCOSE 116 11/10/2013 1449   BUN 25 (H) 01/28/2021 0405   BUN 13.2 11/10/2013 1449   CREATININE 0.75 01/28/2021 0405   CREATININE 0.8 11/10/2013 1449   CALCIUM 8.6 (L) 01/28/2021 0405   CALCIUM 9.1 11/10/2013 1449   PROT 6.0 (L) 01/28/2021 0405   PROT  7.1 11/10/2013 1449   ALBUMIN 3.3 (L) 01/28/2021 0405   ALBUMIN 3.8 11/10/2013 1449   AST 16 01/28/2021 0405   AST 19 11/10/2013 1449   ALT 15 01/28/2021 0405   ALT 17 11/10/2013 1449   ALKPHOS 48 01/28/2021 0405   ALKPHOS 76 11/10/2013 1449   BILITOT 1.2 01/28/2021 0405   BILITOT 0.74 11/10/2013 1449   GFRNONAA >60 01/28/2021 0405   GFRAA >60 05/06/2020 1441   Lipase     Component Value Date/Time   LIPASE 35 01/26/2021 1830       Studies/Results: CT Abdomen Pelvis Wo Contrast  Result Date: 01/26/2021 CLINICAL DATA:  Unspecified abdominal pain, vomiting EXAM: CT ABDOMEN AND PELVIS WITHOUT CONTRAST TECHNIQUE: Multidetector CT imaging of the abdomen and pelvis was performed following the standard protocol without IV contrast. COMPARISON:  01/06/2021 FINDINGS: Lower chest: Large hiatal hernia is present which appears fluid-filled. The visualized lung bases are clear. The visualized heart and pericardium are unremarkable. Hepatobiliary: Stable cyst within the left hepatic lobe. No definite solid intrahepatic mass identified on this noncontrast examination. Cholecystectomy has been performed. No intra or extrahepatic biliary ductal dilation. Pancreas: Unremarkable Spleen: Unremarkable Adrenals/Urinary Tract: The adrenal glands are unremarkable. The kidneys are normal in size and position. 6 mm nonobstructing calculus noted within the lower  pole of the right kidney. The kidneys are otherwise unremarkable. The bladder is unremarkable. Stomach/Bowel: There is a high-grade proximal large bowel obstruction involving the mid ascending colon with the point transition best seen axial image # 39/2 and coronal image # 87/5. 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 An underlying intrinsic neoplasm, however, is not excluded on this examination. Proximally, the small bowel is dilated and fluid-filled. Distally, the large bowel is  decompressed. There is moderate sigmoid diverticulosis. No free intraperitoneal gas or fluid. Vascular/Lymphatic: Moderate atherosclerotic calcification within the abdominal aorta. No aortic aneurysm. No pathologic adenopathy within the abdomen and pelvis. Reproductive: Status post hysterectomy. No adnexal masses. Other: Tiny fat containing umbilical hernia. The rectum is unremarkable. Musculoskeletal: No acute bone abnormality. No lytic or blastic bone lesion. IMPRESSION: High-grade proximal large bowel obstruction involving the mid ascending colon. 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 Mild right nonobstructing nephrolithiasis. Large hiatal hernia Aortic Atherosclerosis (ICD10-I70.0). Electronically Signed   By: Fidela Salisbury MD   On: 01/26/2021 21:50   DG Abdomen 1 View  Result Date: 01/27/2021 CLINICAL DATA:  NG tube placement. EXAM: ABDOMEN - 1 VIEW COMPARISON:  Jan 26, 2021 FINDINGS: A nasogastric tube is seen with its distal end looped upon itself, overlying the medial aspect of the right lung base and adjacent portion of the thoracic spine. This is likely within the patient's known large gastric hernia. The bowel gas pattern is normal. Radiopaque surgical clips are seen overlying the right upper quadrant. No radio-opaque calculi or other significant radiographic abnormality are seen. IMPRESSION: Nasogastric tube positioning, as described above Electronically Signed   By: Virgina Norfolk M.D.   On: 01/27/2021 02:26   DG Chest Port 1 View  Result Date: 01/26/2021 CLINICAL DATA:  Onset abdominal pain and vomiting this morning. EXAM: PORTABLE CHEST 1 VIEW COMPARISON:  PA and lateral chest 04/08/2014. FINDINGS: Lungs clear. Heart size normal. Hiatal hernia again seen. No pneumothorax or pleural fluid. No acute or focal bony  abnormality. IMPRESSION: No acute disease. Hiatal hernia. Electronically Signed   By: Inge Rise M.D.   On: 01/26/2021 20:00   DG Abd Portable 1 View  Result Date: 01/26/2021 CLINICAL DATA:  Onset abdominal pain and vomiting this morning. EXAM: PORTABLE ABDOMEN - 1 VIEW COMPARISON:  None. FINDINGS: Gas-filled loops of small bowel are dilated up to approximately 4.3 cm. There is some gas in the ascending colon. No unexpected abdominal calcification is seen. No acute bony abnormality. IMPRESSION: Dilated loops of small bowel with gas in the colon worrisome for early or partial small bowel obstruction. Electronically Signed   By: Inge Rise M.D.   On: 01/26/2021 20:02   DG INTRO LONG GI TUBE  Result Date: 01/27/2021 CLINICAL DATA:  Small-bowel obstruction. Feeding tube for decompression. EXAM: FL FEEDING TUBE PLACEMENT CONTRAST:  50 mL of Omnipaque 300 through the NG tube FLUOROSCOPY TIME:  Fluoroscopy Time:  3 minutes 12 second Radiation Exposure Index (if provided by the fluoroscopic device): Number of Acquired Spot Images: 2 COMPARISON:  CT abdomen pelvis 01/26/2021 FINDINGS: Review of the prior CT reveals a large hiatal hernia and small-bowel obstruction. NG tube passes through the right nares after lubrication and anesthesia with lidocaine lubricant. NG tube placed into the hiatal hernia. It was difficult to enter the stomach below the hernia. Addition  of the Amplatz wire and injection of contrast eventually allowed the tube to pass into the stomach. Final tube position is in the antrum of the stomach. IMPRESSION: Large hiatal hernia. Feeding tube placed with the tip in the antrum of the stomach. Electronically Signed   By: Franchot Gallo M.D.   On: 01/27/2021 10:20    Anti-infectives: Anti-infectives (From admission, onward)   None       Assessment/Plan Angelica [name] is an 80 y.o. female with apparent narrowing in proximal ascending colon - following inflammatory changes in TI, cecum,  asc colon 1 mo prior - Per Dr. 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 Her symptoms have again resolved and she feels 'normal. She has had ROBF and is NT on exam. Etiology of cuase is unclear at this point. I think given frequency of this and inability to go more than a week or so without recurrence, could benefit from GI consult for potential endoscopic evaluation. Continue NPO, NGT, IVF resuscitation. Keep K > 4 and Mg > 2 for bowel function. Mobilize for bowel function. We will follow with you   LOS: 2 days    Jillyn Ledger , [facility] Surgery 01/28/2021, 10:39 AM Please see Amion for pager number during day hours 7:00am-4:30pm

## 2021-01-28 NOTE — Care Management Important Message (Signed)
Important Message  Patient Details IM Letter given to the Patient. Name: Angelica [name] MRN: [id number] Date of Birth: 14-Jan-1941   Medicare Important Message Given:  Yes     Kerin Salen 01/28/2021, 12:33 PM

## 2021-01-28 NOTE — H&P (View-Only) (Signed)
Referring Provider: [facility] Primary Care Physician:  Velna Hatchet, MD Primary Gastroenterologist:  Dr. Therisa Doyne [facility])  Reason for Consultation: Colonic obstruction  HPI: Angelica [name] is a 80 y.o. female with past medical history of thyroid cancer, large hiatal hernia, chronic IDA (receives IV iron with Dr. Alvy Bimler), and esophageal dysmotility presenting for consultation of large bowel obstruction.  Patient states that she started having abdominal pain on Tuesday night 5/3 while she was sleeping.  She states she woke up and pain was worsening.  She also had numerous episodes of nausea and vomiting and was unable to keep any food or liquids down.  She initially did not want to present to the hospital, but her sister encouraged her to go to the hospital, and she presented to the ED 5/4.  Patient reports that she is typically constipated, though she has had liquid stools over the past few days.  Denies any melena or hematochezia.  FOBT x 6 negative 04/2020.  She reports approximately 40 pound weight loss in the last 6 months.  Today, abdominal pain has improved, and she is not having nausea/vomiting with NG tube in place  She had a CT abdomen/pelvis in 12/2020 which revealed abnormal thickening of the cecum, ascending colon, and contiguous thickening in the terminal ileum.  She planned to undergo colonoscopy in June.  CT 5/4 revealed High-grade proximal large bowel obstruction involving the mid ascending colon. 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  Denies aspirin, NSAID, or blood thinner use.  Denies family history of colon cancer or gastrointestinal malignancy.  Past GI work-up noted below: EGD 2015: large hiatal hernia, negative bx celiac Colonoscopy 2016: diverticulosis, otherwise normal, no repeat recommended due  to age Barium swallow 03/2020: severe esophageal dysmotility, large hiatal involving approximately half of stomach   Past Medical History:  Diagnosis Date  . Anemia   . Arthritis   . Asthma   . Celiac disease 11/17/2013  . COPD (chronic obstructive pulmonary disease) (HCC)    on 2L prn home O2  . Hypertension   . Rectal bleed 10/23/2018  . Thyroid cancer ([place])    remote    Past Surgical History:  Procedure Laterality Date  . ABDOMINAL HYSTERECTOMY    . BREAST EXCISIONAL BIOPSY Bilateral   . CHOLECYSTECTOMY    . COLONOSCOPY  07/05/2012   Procedure: COLONOSCOPY;  Surgeon: Winfield Cunas., MD;  Location: [facility] ENDOSCOPY;  Service: Endoscopy;  Laterality: N/A;  . COLONOSCOPY N/A 03/12/2015   Procedure: COLONOSCOPY;  Surgeon: Laurence Spates, MD;  Location: [facility] ENDOSCOPY;  Service: Endoscopy;  Laterality: N/A;  . ESOPHAGOGASTRODUODENOSCOPY  07/05/2012   Procedure: ESOPHAGOGASTRODUODENOSCOPY (EGD);  Surgeon: Winfield Cunas., MD;  Location: [facility] ENDOSCOPY;  Service: Endoscopy;  Laterality: N/A;  . HOT HEMOSTASIS N/A 03/12/2015   Procedure: HOT HEMOSTASIS (ARGON PLASMA COAGULATION/BICAP);  Surgeon: Laurence Spates, MD;  Location: [facility] ENDOSCOPY;  Service: Endoscopy;  Laterality: N/A;  . TONSILLECTOMY      Prior to Admission medications   Medication Sig Start Date End Date Taking? Authorizing Provider  acetaminophen (TYLENOL) 500 MG tablet Take 500 mg by mouth every 6 (six) hours as needed (body ache).   Yes [provider]  albuterol (PROVENTIL HFA;VENTOLIN HFA) 108 (90 Base) MCG/ACT inhaler Inhale into the lungs every 6 (six) hours as needed for wheezing or shortness of breath.   Yes [provider]  amLODipine (NORVASC) 5 MG tablet Take 5 mg by mouth at bedtime.    Yes [provider]  docusate sodium (COLACE) 250 MG capsule Take 250 mg by mouth daily as needed for constipation.   Yes [provider]  levothyroxine (SYNTHROID, LEVOTHROID) 125 MCG tablet  Take 125 mcg by mouth daily.   Yes [provider]  ondansetron (ZOFRAN ODT) 8 MG disintegrating tablet Take 1 tablet (8 mg total) by mouth every 8 (eight) hours as needed. 11/24/20  Yes Ripley Fraise, MD  OXYGEN Inhale 2 L into the lungs as needed (LOW OXYGEN).   Yes [provider]  pantoprazole (PROTONIX) 40 MG tablet Take 1 tablet (40 mg total) by mouth daily. 10/24/18  Yes Norval Morton, MD  hydrocortisone (ANUSOL-HC) 25 MG suppository Place 1 suppository (25 mg total) rectally 2 (two) times daily as needed for hemorrhoids or anal itching. Patient not taking: Reported on 01/27/2021 10/24/18   Norval Morton, MD  polyethylene glycol [facility]) packet Take 17 g by mouth 2 (two) times daily. Patient not taking: Reported on 01/27/2021 10/24/18   Norval Morton, MD    Scheduled Meds: . insulin aspart  0-9 Units Subcutaneous TID WC  . [START ON 01/30/2021] levothyroxine  50 mcg Intravenous Daily  . pantoprazole  40 mg Oral Daily   Continuous Infusions: . lactated ringers 75 mL/hr at 01/28/21 0937  . potassium chloride 10 mEq (01/28/21 0938)   PRN Meds:.lip balm, morphine injection  Allergies as of 01/26/2021 - Review Complete 01/26/2021  Allergen Reaction Noted  . Other Hives 07/03/2012  . Fosamax [alendronate sodium]  07/03/2012  . Penicillins Other (See Comments) 07/03/2012  . Sulfa antibiotics  07/03/2012    Family History  Problem Relation Age of Onset  . Heart disease Mother   . Kidney failure Father   . Cancer Sister        thyroid cancer    Social History   Socioeconomic History  . Marital status: Widowed    Spouse name: Not on file  . Number of children: Not on file  . Years of education: Not on file  . Highest education level: Not on file  Occupational History  . Occupation: retired  Tobacco Use  . Smoking status: Former Smoker    Packs/day: 0.75    Years: 60.00    Pack years: 45.00    Types: Cigarettes    Quit date: 07/03/2008    Years  since quitting: 12.5  . Smokeless tobacco: Never Used  Vaping Use  . Vaping Use: Never used  Substance and Sexual Activity  . Alcohol use: No  . Drug use: No  . Sexual activity: Not on file  Other Topics Concern  . Not on file  Social History Narrative  . Not on file   Social Determinants of Health   Financial Resource Strain: Not on file  Food Insecurity: Not on file  Transportation Needs: Not on file  Physical Activity: Not on file  Stress: Not on file  Social Connections: Not on file  Intimate Partner Violence: Not on file    Review of Systems: Review of Systems  Constitutional: Positive for weight loss. Negative for chills and fever.  HENT: Negative for sore throat.   Eyes: Negative for pain and redness.  Respiratory: Negative for cough, shortness of breath and stridor.   Cardiovascular: Negative for chest pain and palpitations.  Gastrointestinal: Positive for abdominal pain, constipation, diarrhea, nausea and vomiting. Negative for blood  in stool, heartburn and melena.  Genitourinary: Negative for flank pain and hematuria.  Musculoskeletal: Negative for falls and joint pain.  Skin: Negative for itching and rash.  Neurological: Negative for seizures and loss of consciousness.  Endo/Heme/Allergies: Negative for polydipsia. Does not bruise/bleed easily.  Psychiatric/Behavioral: Negative for substance abuse. The patient is not nervous/anxious.     Physical Exam: Vital signs: Vitals:   01/27/21 2300 01/28/21 0345  BP:  121/64  Pulse:  83  Resp:  (!) 22  Temp:  98.6 F (37 C)  SpO2: 93% 93%   Last BM Date: 01/27/21 Physical Exam Vitals reviewed.  Constitutional:      General: She is not in acute distress. HENT:     Head: Normocephalic and atraumatic.     Nose: Nose normal. No congestion.     Comments: NG tube in place with bilious output    Mouth/Throat:     Mouth: Mucous membranes are moist.     Pharynx: Oropharynx is clear.  Eyes:     General: No  scleral icterus.    Extraocular Movements: Extraocular movements intact.     Conjunctiva/sclera: Conjunctivae normal.  Cardiovascular:     Rate and Rhythm: Normal rate and regular rhythm.  Pulmonary:     Effort: Pulmonary effort is normal. No respiratory distress.  Abdominal:     General: Bowel sounds are normal. There is no distension.     Palpations: Abdomen is soft. There is no mass.     Tenderness: There is no abdominal tenderness. There is no guarding or rebound.     Hernia: No hernia is present.  Musculoskeletal:        General: No swelling or tenderness.     Cervical back: Normal range of motion and neck supple.  Skin:    General: Skin is warm and dry.  Neurological:     General: No focal deficit present.     Mental Status: She is oriented to person, place, and time. She is lethargic.  Psychiatric:        Mood and Affect: Mood normal.        Behavior: Behavior normal. Behavior is cooperative.      GI:  Lab Results: Recent Labs    01/26/21 1830 01/27/21 0433 01/28/21 0405  WBC 14.7* 8.7 7.7  HGB 16.3* 13.1 13.0  HCT 50.2* 41.2 41.6  PLT 353 268 243   BMET Recent Labs    01/26/21 1830 01/27/21 0433 01/28/21 0405  NA 140 145 148*  K 3.3* 3.2* 3.1*  CL 101 109 112*  CO2 23 25 27   GLUCOSE 207* 126* 90  BUN 26* 27* 25*  CREATININE 1.59* 1.01* 0.75  CALCIUM 10.1 8.7* 8.6*   LFT Recent Labs    01/28/21 0405  PROT 6.0*  ALBUMIN 3.3*  AST 16  ALT 15  ALKPHOS 48  BILITOT 1.2   PT/INR No results for input(s): LABPROT, INR in the last 72 hours.   Studies/Results: CT Abdomen Pelvis Wo Contrast  Result Date: 01/26/2021 CLINICAL DATA:  Unspecified abdominal pain, vomiting EXAM: CT ABDOMEN AND PELVIS WITHOUT CONTRAST TECHNIQUE: Multidetector CT imaging of the abdomen and pelvis was performed following the standard protocol without IV contrast. COMPARISON:  01/06/2021 FINDINGS: Lower chest: Large hiatal hernia is present which appears fluid-filled. The  visualized lung bases are clear. The visualized heart and pericardium are unremarkable. Hepatobiliary: Stable cyst within the left hepatic lobe. No definite solid intrahepatic mass identified on this noncontrast examination. Cholecystectomy has been performed.  No intra or extrahepatic biliary ductal dilation. Pancreas: Unremarkable Spleen: Unremarkable Adrenals/Urinary Tract: The adrenal glands are unremarkable. The kidneys are normal in size and position. 6 mm nonobstructing calculus noted within the lower pole of the right kidney. The kidneys are otherwise unremarkable. The bladder is unremarkable. Stomach/Bowel: There is a high-grade proximal large bowel obstruction involving the mid ascending colon with the point transition best seen axial image # 39/2 and coronal image # 87/5. 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 An underlying intrinsic neoplasm, however, is not excluded on this examination. Proximally, the small bowel is dilated and fluid-filled. Distally, the large bowel is decompressed. There is moderate sigmoid diverticulosis. No free intraperitoneal gas or fluid. Vascular/Lymphatic: Moderate atherosclerotic calcification within the abdominal aorta. No aortic aneurysm. No pathologic adenopathy within the abdomen and pelvis. Reproductive: Status post hysterectomy. No adnexal masses. Other: Tiny fat containing umbilical hernia. The rectum is unremarkable. Musculoskeletal: No acute bone abnormality. No lytic or blastic bone lesion. IMPRESSION: High-grade proximal large bowel obstruction involving the mid ascending colon. 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 Mild right nonobstructing nephrolithiasis. Large hiatal hernia Aortic Atherosclerosis (ICD10-I70.0).  Electronically Signed   By: Fidela Salisbury MD   On: 01/26/2021 21:50   DG Abdomen 1 View  Result Date: 01/27/2021 CLINICAL DATA:  NG tube placement. EXAM: ABDOMEN - 1 VIEW COMPARISON:  Jan 26, 2021 FINDINGS: A nasogastric tube is seen with its distal end looped upon itself, overlying the medial aspect of the right lung base and adjacent portion of the thoracic spine. This is likely within the patient's known large gastric hernia. The bowel gas pattern is normal. Radiopaque surgical clips are seen overlying the right upper quadrant. No radio-opaque calculi or other significant radiographic abnormality are seen. IMPRESSION: Nasogastric tube positioning, as described above Electronically Signed   By: Virgina Norfolk M.D.   On: 01/27/2021 02:26   DG Chest Port 1 View  Result Date: 01/26/2021 CLINICAL DATA:  Onset abdominal pain and vomiting this morning. EXAM: PORTABLE CHEST 1 VIEW COMPARISON:  PA and lateral chest 04/08/2014. FINDINGS: Lungs clear. Heart size normal. Hiatal hernia again seen. No pneumothorax or pleural fluid. No acute or focal bony abnormality. IMPRESSION: No acute disease. Hiatal hernia. Electronically Signed   By: Inge Rise M.D.   On: 01/26/2021 20:00   DG Abd Portable 1 View  Result Date: 01/26/2021 CLINICAL DATA:  Onset abdominal pain and vomiting this morning. EXAM: PORTABLE ABDOMEN - 1 VIEW COMPARISON:  None. FINDINGS: Gas-filled loops of small bowel are dilated up to approximately 4.3 cm. There is some gas in the ascending colon. No unexpected abdominal calcification is seen. No acute bony abnormality. IMPRESSION: Dilated loops of small bowel with gas in the colon worrisome for early or partial small bowel obstruction. Electronically Signed   By: Inge Rise M.D.   On: 01/26/2021 20:02   DG INTRO LONG GI TUBE  Result Date: 01/27/2021 CLINICAL DATA:  Small-bowel obstruction. Feeding tube for decompression. EXAM: FL FEEDING TUBE PLACEMENT CONTRAST:  50 mL of Omnipaque 300  through the NG tube FLUOROSCOPY TIME:  Fluoroscopy Time:  3 minutes 12 second Radiation Exposure Index (if provided by the fluoroscopic device): Number of Acquired Spot Images: 2 COMPARISON:  CT abdomen pelvis 01/26/2021 FINDINGS: Review of the prior CT reveals a large hiatal hernia and  small-bowel obstruction. NG tube passes through the right nares after lubrication and anesthesia with lidocaine lubricant. NG tube placed into the hiatal hernia. It was difficult to enter the stomach below the hernia. Addition of the Amplatz wire and injection of contrast eventually allowed the tube to pass into the stomach. Final tube position is in the antrum of the stomach. IMPRESSION: Large hiatal hernia. Feeding tube placed with the tip in the antrum of the stomach. Electronically Signed   By: Franchot Gallo M.D.   On: 01/27/2021 10:20    Impression:  High-grade proximal large bowel obstruction involving the mid ascending colon. -NG tube in place with improvement in symptoms -Normal hemoglobin (13.0) and WBCs (7.7)  Chronic IDA  Remote thyroid cancer  Plan: Colonoscopy tomorrow with Dr. Therisa Doyne.  The procedure was thoroughly discussed with patient to include nature, alternatives, benefits, and risks (including but not limited to bleeding, infection, perforation, anesthesia/cardiac and pulmonary complications).  Patient verbalized understanding and gave verbal consent to proceed with colonoscopy.  2 tapwater enemas ordered for today.  1 tapwater enema in the morning.  NPO.  [facility] will follow.   LOS: 2 days   Salley Slaughter  PA-C 01/28/2021, 10:37 AM  Contact #  [phone]

## 2021-01-28 NOTE — Plan of Care (Signed)
  Problem: Education: Goal: Knowledge of General Education information will improve Description: Including pain rating scale, medication(s)/side effects and non-pharmacologic comfort measures Outcome: Progressing   Problem: Activity: Goal: Risk for activity intolerance will decrease Outcome: Progressing   Problem: Elimination: Goal: Will not experience complications related to bowel motility Outcome: Progressing Goal: Will not experience complications related to urinary retention Outcome: Progressing   Problem: Pain Managment: Goal: General experience of comfort will improve Outcome: Progressing   Problem: Safety: Goal: Ability to remain free from injury will improve Outcome: Progressing   Problem: Skin Integrity: Goal: Risk for impaired skin integrity will decrease Outcome: Progressing

## 2021-01-28 NOTE — Consult Note (Signed)
Referring Provider: [facility] Primary Care Physician:  Velna Hatchet, MD Primary Gastroenterologist:  Dr. Therisa Doyne [facility])  Reason for Consultation: Colonic obstruction  HPI: Angelica [name] is a 80 y.o. female with past medical history of thyroid cancer, large hiatal hernia, chronic IDA (receives IV iron with Dr. Alvy Bimler), and esophageal dysmotility presenting for consultation of large bowel obstruction.  Patient states that she started having abdominal pain on Tuesday night 5/3 while she was sleeping.  She states she woke up and pain was worsening.  She also had numerous episodes of nausea and vomiting and was unable to keep any food or liquids down.  She initially did not want to present to the hospital, but her sister encouraged her to go to the hospital, and she presented to the ED 5/4.  Patient reports that she is typically constipated, though she has had liquid stools over the past few days.  Denies any melena or hematochezia.  FOBT x 6 negative 04/2020.  She reports approximately 40 pound weight loss in the last 6 months.  Today, abdominal pain has improved, and she is not having nausea/vomiting with NG tube in place  She had a CT abdomen/pelvis in 12/2020 which revealed abnormal thickening of the cecum, ascending colon, and contiguous thickening in the terminal ileum.  She planned to undergo colonoscopy in June.  CT 5/4 revealed High-grade proximal large bowel obstruction involving the mid ascending colon. 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  Denies aspirin, NSAID, or blood thinner use.  Denies family history of colon cancer or gastrointestinal malignancy.  Past GI work-up noted below: EGD 2015: large hiatal hernia, negative bx celiac Colonoscopy 2016: diverticulosis, otherwise normal, no repeat recommended due  to age Barium swallow 03/2020: severe esophageal dysmotility, large hiatal involving approximately half of stomach   Past Medical History:  Diagnosis Date  . Anemia   . Arthritis   . Asthma   . Celiac disease 11/17/2013  . COPD (chronic obstructive pulmonary disease) (HCC)    on 2L prn home O2  . Hypertension   . Rectal bleed 10/23/2018  . Thyroid cancer ([place])    remote    Past Surgical History:  Procedure Laterality Date  . ABDOMINAL HYSTERECTOMY    . BREAST EXCISIONAL BIOPSY Bilateral   . CHOLECYSTECTOMY    . COLONOSCOPY  07/05/2012   Procedure: COLONOSCOPY;  Surgeon: Winfield Cunas., MD;  Location: [facility] ENDOSCOPY;  Service: Endoscopy;  Laterality: N/A;  . COLONOSCOPY N/A 03/12/2015   Procedure: COLONOSCOPY;  Surgeon: Laurence Spates, MD;  Location: [facility] ENDOSCOPY;  Service: Endoscopy;  Laterality: N/A;  . ESOPHAGOGASTRODUODENOSCOPY  07/05/2012   Procedure: ESOPHAGOGASTRODUODENOSCOPY (EGD);  Surgeon: Winfield Cunas., MD;  Location: [facility] ENDOSCOPY;  Service: Endoscopy;  Laterality: N/A;  . HOT HEMOSTASIS N/A 03/12/2015   Procedure: HOT HEMOSTASIS (ARGON PLASMA COAGULATION/BICAP);  Surgeon: Laurence Spates, MD;  Location: [facility] ENDOSCOPY;  Service: Endoscopy;  Laterality: N/A;  . TONSILLECTOMY      Prior to Admission medications   Medication Sig Start Date End Date Taking? Authorizing Provider  acetaminophen (TYLENOL) 500 MG tablet Take 500 mg by mouth every 6 (six) hours as needed (body ache).   Yes [provider]  albuterol (PROVENTIL HFA;VENTOLIN HFA) 108 (90 Base) MCG/ACT inhaler Inhale into the lungs every 6 (six) hours as needed for wheezing or shortness of breath.   Yes [provider]  amLODipine (NORVASC) 5 MG tablet Take 5 mg by mouth at bedtime.    Yes [provider]  docusate sodium (COLACE) 250 MG capsule Take 250 mg by mouth daily as needed for constipation.   Yes [provider]  levothyroxine (SYNTHROID, LEVOTHROID) 125 MCG tablet  Take 125 mcg by mouth daily.   Yes [provider]  ondansetron (ZOFRAN ODT) 8 MG disintegrating tablet Take 1 tablet (8 mg total) by mouth every 8 (eight) hours as needed. 11/24/20  Yes Ripley Fraise, MD  OXYGEN Inhale 2 L into the lungs as needed (LOW OXYGEN).   Yes [provider]  pantoprazole (PROTONIX) 40 MG tablet Take 1 tablet (40 mg total) by mouth daily. 10/24/18  Yes Norval Morton, MD  hydrocortisone (ANUSOL-HC) 25 MG suppository Place 1 suppository (25 mg total) rectally 2 (two) times daily as needed for hemorrhoids or anal itching. Patient not taking: Reported on 01/27/2021 10/24/18   Norval Morton, MD  polyethylene glycol [facility]) packet Take 17 g by mouth 2 (two) times daily. Patient not taking: Reported on 01/27/2021 10/24/18   Norval Morton, MD    Scheduled Meds: . insulin aspart  0-9 Units Subcutaneous TID WC  . [START ON 01/30/2021] levothyroxine  50 mcg Intravenous Daily  . pantoprazole  40 mg Oral Daily   Continuous Infusions: . lactated ringers 75 mL/hr at 01/28/21 0937  . potassium chloride 10 mEq (01/28/21 0938)   PRN Meds:.lip balm, morphine injection  Allergies as of 01/26/2021 - Review Complete 01/26/2021  Allergen Reaction Noted  . Other Hives 07/03/2012  . Fosamax [alendronate sodium]  07/03/2012  . Penicillins Other (See Comments) 07/03/2012  . Sulfa antibiotics  07/03/2012    Family History  Problem Relation Age of Onset  . Heart disease Mother   . Kidney failure Father   . Cancer Sister        thyroid cancer    Social History   Socioeconomic History  . Marital status: Widowed    Spouse name: Not on file  . Number of children: Not on file  . Years of education: Not on file  . Highest education level: Not on file  Occupational History  . Occupation: retired  Tobacco Use  . Smoking status: Former Smoker    Packs/day: 0.75    Years: 60.00    Pack years: 45.00    Types: Cigarettes    Quit date: 07/03/2008    Years  since quitting: 12.5  . Smokeless tobacco: Never Used  Vaping Use  . Vaping Use: Never used  Substance and Sexual Activity  . Alcohol use: No  . Drug use: No  . Sexual activity: Not on file  Other Topics Concern  . Not on file  Social History Narrative  . Not on file   Social Determinants of Health   Financial Resource Strain: Not on file  Food Insecurity: Not on file  Transportation Needs: Not on file  Physical Activity: Not on file  Stress: Not on file  Social Connections: Not on file  Intimate Partner Violence: Not on file    Review of Systems: Review of Systems  Constitutional: Positive for weight loss. Negative for chills and fever.  HENT: Negative for sore throat.   Eyes: Negative for pain and redness.  Respiratory: Negative for cough, shortness of breath and stridor.   Cardiovascular: Negative for chest pain and palpitations.  Gastrointestinal: Positive for abdominal pain, constipation, diarrhea, nausea and vomiting. Negative for blood  in stool, heartburn and melena.  Genitourinary: Negative for flank pain and hematuria.  Musculoskeletal: Negative for falls and joint pain.  Skin: Negative for itching and rash.  Neurological: Negative for seizures and loss of consciousness.  Endo/Heme/Allergies: Negative for polydipsia. Does not bruise/bleed easily.  Psychiatric/Behavioral: Negative for substance abuse. The patient is not nervous/anxious.     Physical Exam: Vital signs: Vitals:   01/27/21 2300 01/28/21 0345  BP:  121/64  Pulse:  83  Resp:  (!) 22  Temp:  98.6 F (37 C)  SpO2: 93% 93%   Last BM Date: 01/27/21 Physical Exam Vitals reviewed.  Constitutional:      General: She is not in acute distress. HENT:     Head: Normocephalic and atraumatic.     Nose: Nose normal. No congestion.     Comments: NG tube in place with bilious output    Mouth/Throat:     Mouth: Mucous membranes are moist.     Pharynx: Oropharynx is clear.  Eyes:     General: No  scleral icterus.    Extraocular Movements: Extraocular movements intact.     Conjunctiva/sclera: Conjunctivae normal.  Cardiovascular:     Rate and Rhythm: Normal rate and regular rhythm.  Pulmonary:     Effort: Pulmonary effort is normal. No respiratory distress.  Abdominal:     General: Bowel sounds are normal. There is no distension.     Palpations: Abdomen is soft. There is no mass.     Tenderness: There is no abdominal tenderness. There is no guarding or rebound.     Hernia: No hernia is present.  Musculoskeletal:        General: No swelling or tenderness.     Cervical back: Normal range of motion and neck supple.  Skin:    General: Skin is warm and dry.  Neurological:     General: No focal deficit present.     Mental Status: She is oriented to person, place, and time. She is lethargic.  Psychiatric:        Mood and Affect: Mood normal.        Behavior: Behavior normal. Behavior is cooperative.      GI:  Lab Results: Recent Labs    01/26/21 1830 01/27/21 0433 01/28/21 0405  WBC 14.7* 8.7 7.7  HGB 16.3* 13.1 13.0  HCT 50.2* 41.2 41.6  PLT 353 268 243   BMET Recent Labs    01/26/21 1830 01/27/21 0433 01/28/21 0405  NA 140 145 148*  K 3.3* 3.2* 3.1*  CL 101 109 112*  CO2 23 25 27   GLUCOSE 207* 126* 90  BUN 26* 27* 25*  CREATININE 1.59* 1.01* 0.75  CALCIUM 10.1 8.7* 8.6*   LFT Recent Labs    01/28/21 0405  PROT 6.0*  ALBUMIN 3.3*  AST 16  ALT 15  ALKPHOS 48  BILITOT 1.2   PT/INR No results for input(s): LABPROT, INR in the last 72 hours.   Studies/Results: CT Abdomen Pelvis Wo Contrast  Result Date: 01/26/2021 CLINICAL DATA:  Unspecified abdominal pain, vomiting EXAM: CT ABDOMEN AND PELVIS WITHOUT CONTRAST TECHNIQUE: Multidetector CT imaging of the abdomen and pelvis was performed following the standard protocol without IV contrast. COMPARISON:  01/06/2021 FINDINGS: Lower chest: Large hiatal hernia is present which appears fluid-filled. The  visualized lung bases are clear. The visualized heart and pericardium are unremarkable. Hepatobiliary: Stable cyst within the left hepatic lobe. No definite solid intrahepatic mass identified on this noncontrast examination. Cholecystectomy has been performed.  No intra or extrahepatic biliary ductal dilation. Pancreas: Unremarkable Spleen: Unremarkable Adrenals/Urinary Tract: The adrenal glands are unremarkable. The kidneys are normal in size and position. 6 mm nonobstructing calculus noted within the lower pole of the right kidney. The kidneys are otherwise unremarkable. The bladder is unremarkable. Stomach/Bowel: There is a high-grade proximal large bowel obstruction involving the mid ascending colon with the point transition best seen axial image # 39/2 and coronal image # 87/5. 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 An underlying intrinsic neoplasm, however, is not excluded on this examination. Proximally, the small bowel is dilated and fluid-filled. Distally, the large bowel is decompressed. There is moderate sigmoid diverticulosis. No free intraperitoneal gas or fluid. Vascular/Lymphatic: Moderate atherosclerotic calcification within the abdominal aorta. No aortic aneurysm. No pathologic adenopathy within the abdomen and pelvis. Reproductive: Status post hysterectomy. No adnexal masses. Other: Tiny fat containing umbilical hernia. The rectum is unremarkable. Musculoskeletal: No acute bone abnormality. No lytic or blastic bone lesion. IMPRESSION: High-grade proximal large bowel obstruction involving the mid ascending colon. 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 Mild right nonobstructing nephrolithiasis. Large hiatal hernia Aortic Atherosclerosis (ICD10-I70.0).  Electronically Signed   By: Fidela Salisbury MD   On: 01/26/2021 21:50   DG Abdomen 1 View  Result Date: 01/27/2021 CLINICAL DATA:  NG tube placement. EXAM: ABDOMEN - 1 VIEW COMPARISON:  Jan 26, 2021 FINDINGS: A nasogastric tube is seen with its distal end looped upon itself, overlying the medial aspect of the right lung base and adjacent portion of the thoracic spine. This is likely within the patient's known large gastric hernia. The bowel gas pattern is normal. Radiopaque surgical clips are seen overlying the right upper quadrant. No radio-opaque calculi or other significant radiographic abnormality are seen. IMPRESSION: Nasogastric tube positioning, as described above Electronically Signed   By: Virgina Norfolk M.D.   On: 01/27/2021 02:26   DG Chest Port 1 View  Result Date: 01/26/2021 CLINICAL DATA:  Onset abdominal pain and vomiting this morning. EXAM: PORTABLE CHEST 1 VIEW COMPARISON:  PA and lateral chest 04/08/2014. FINDINGS: Lungs clear. Heart size normal. Hiatal hernia again seen. No pneumothorax or pleural fluid. No acute or focal bony abnormality. IMPRESSION: No acute disease. Hiatal hernia. Electronically Signed   By: Inge Rise M.D.   On: 01/26/2021 20:00   DG Abd Portable 1 View  Result Date: 01/26/2021 CLINICAL DATA:  Onset abdominal pain and vomiting this morning. EXAM: PORTABLE ABDOMEN - 1 VIEW COMPARISON:  None. FINDINGS: Gas-filled loops of small bowel are dilated up to approximately 4.3 cm. There is some gas in the ascending colon. No unexpected abdominal calcification is seen. No acute bony abnormality. IMPRESSION: Dilated loops of small bowel with gas in the colon worrisome for early or partial small bowel obstruction. Electronically Signed   By: Inge Rise M.D.   On: 01/26/2021 20:02   DG INTRO LONG GI TUBE  Result Date: 01/27/2021 CLINICAL DATA:  Small-bowel obstruction. Feeding tube for decompression. EXAM: FL FEEDING TUBE PLACEMENT CONTRAST:  50 mL of Omnipaque 300  through the NG tube FLUOROSCOPY TIME:  Fluoroscopy Time:  3 minutes 12 second Radiation Exposure Index (if provided by the fluoroscopic device): Number of Acquired Spot Images: 2 COMPARISON:  CT abdomen pelvis 01/26/2021 FINDINGS: Review of the prior CT reveals a large hiatal hernia and  small-bowel obstruction. NG tube passes through the right nares after lubrication and anesthesia with lidocaine lubricant. NG tube placed into the hiatal hernia. It was difficult to enter the stomach below the hernia. Addition of the Amplatz wire and injection of contrast eventually allowed the tube to pass into the stomach. Final tube position is in the antrum of the stomach. IMPRESSION: Large hiatal hernia. Feeding tube placed with the tip in the antrum of the stomach. Electronically Signed   By: Franchot Gallo M.D.   On: 01/27/2021 10:20    Impression:  High-grade proximal large bowel obstruction involving the mid ascending colon. -NG tube in place with improvement in symptoms -Normal hemoglobin (13.0) and WBCs (7.7)  Chronic IDA  Remote thyroid cancer  Plan: Colonoscopy tomorrow with Dr. Therisa Doyne.  The procedure was thoroughly discussed with patient to include nature, alternatives, benefits, and risks (including but not limited to bleeding, infection, perforation, anesthesia/cardiac and pulmonary complications).  Patient verbalized understanding and gave verbal consent to proceed with colonoscopy.  2 tapwater enemas ordered for today.  1 tapwater enema in the morning.  NPO.  [facility] will follow.   LOS: 2 days   Salley Slaughter  PA-C 01/28/2021, 10:37 AM  Contact #  [phone]

## 2021-01-29 ENCOUNTER — Inpatient Hospital Stay (HOSPITAL_COMMUNITY): Payer: Medicare Other | Admitting: Anesthesiology

## 2021-01-29 ENCOUNTER — Encounter (HOSPITAL_COMMUNITY)
Admission: EM | Disposition: A | Discharge status: Home Health | Payer: Self-pay | Source: Home / Self Care | Attending: Internal Medicine

## 2021-01-29 ENCOUNTER — Encounter (HOSPITAL_COMMUNITY): Payer: Self-pay | Admitting: Family Medicine

## 2021-01-29 DIAGNOSIS — K56609 Unspecified intestinal obstruction, unspecified as to partial versus complete obstruction: Secondary | ICD-10-CM | POA: Diagnosis not present

## 2021-01-29 HISTORY — PX: BIOPSY: SHX5522

## 2021-01-29 HISTORY — PX: COLONOSCOPY: SHX5424

## 2021-01-29 HISTORY — PX: SUBMUCOSAL TATTOO INJECTION: SHX6856

## 2021-01-29 LAB — COMPREHENSIVE METABOLIC PANEL
ALT: 14 U/L (ref 0–44)
AST: 15 U/L (ref 15–41)
Albumin: 3.2 g/dL — ABNORMAL LOW (ref 3.5–5.0)
Alkaline Phosphatase: 45 U/L (ref 38–126)
Anion gap: 9 (ref 5–15)
BUN: 13 mg/dL (ref 8–23)
CO2: 25 mmol/L (ref 22–32)
Calcium: 8.3 mg/dL — ABNORMAL LOW (ref 8.9–10.3)
Chloride: 102 mmol/L (ref 98–111)
Creatinine, Ser: 0.68 mg/dL (ref 0.44–1.00)
GFR, Estimated: >60 mL/min (ref >60)
Glucose, Bld: 91 mg/dL (ref 70–99)
Potassium: 3.1 mmol/L — ABNORMAL LOW (ref 3.5–5.1)
Sodium: 136 mmol/L (ref 135–145)
Total Bilirubin: 1.3 mg/dL — ABNORMAL HIGH (ref 0.3–1.2)
Total Protein: 6 g/dL — ABNORMAL LOW (ref 6.5–8.1)

## 2021-01-29 LAB — CBC
HCT: 40.7 % (ref 36.0–46.0)
Hemoglobin: 12.8 g/dL (ref 12.0–15.0)
MCH: 28.3 pg (ref 26.0–34.0)
MCHC: 31.4 g/dL (ref 30.0–36.0)
MCV: 90 fL (ref 80.0–100.0)
Platelets: 246 10*3/uL (ref 150–400)
RBC: 4.52 MIL/uL (ref 3.87–5.11)
RDW: 13.8 % (ref 11.5–15.5)
WBC: 8.6 10*3/uL (ref 4.0–10.5)
nRBC: 0 % (ref 0.0–0.2)

## 2021-01-29 LAB — GLUCOSE, CAPILLARY
Glucose-Capillary: 68 mg/dL — ABNORMAL LOW (ref 70–99)
Glucose-Capillary: 120 mg/dL — ABNORMAL HIGH (ref 70–99)
Glucose-Capillary: 71 mg/dL (ref 70–99)
Glucose-Capillary: 83 mg/dL (ref 70–99)
Glucose-Capillary: 79 mg/dL (ref 70–99)
Glucose-Capillary: 93 mg/dL (ref 70–99)

## 2021-01-29 LAB — PROCALCITONIN: Procalcitonin: <0.1 ng/mL

## 2021-01-29 SURGERY — COLONOSCOPY
Anesthesia: Monitor Anesthesia Care

## 2021-01-29 MED ORDER — EPHEDRINE SULFATE-NACL 50-0.9 MG/10ML-% IV SOSY
PREFILLED_SYRINGE | INTRAVENOUS | Status: DC | PRN
  Administered 2021-01-29: 10 mg via INTRAVENOUS
  Administered 2021-01-29: 15 mg via INTRAVENOUS

## 2021-01-29 MED ORDER — SODIUM CHLORIDE 0.9 % IV SOLN: INTRAVENOUS | Status: DC

## 2021-01-29 MED ORDER — SUCCINYLCHOLINE CHLORIDE 200 MG/10ML IV SOSY
PREFILLED_SYRINGE | INTRAVENOUS | Status: DC | PRN
  Administered 2021-01-29: 120 mg via INTRAVENOUS

## 2021-01-29 MED ORDER — KCL IN DEXTROSE-NACL 20-5-0.45 MEQ/L-%-% IV SOLN
INTRAVENOUS | Status: AC
  Filled 2021-01-29 (×4): qty 1000

## 2021-01-29 MED ORDER — SPOT INK MARKER SYRINGE KIT
PACK | SUBMUCOSAL | Status: DC | PRN
  Administered 2021-01-29: 9 mL via SUBMUCOSAL

## 2021-01-29 MED ORDER — ONDANSETRON HCL 4 MG/2ML IJ SOLN
INTRAMUSCULAR | Status: DC | PRN
  Administered 2021-01-29: 4 mg via INTRAVENOUS

## 2021-01-29 MED ORDER — LIDOCAINE 2% (20 MG/ML) 5 ML SYRINGE
INTRAMUSCULAR | Status: DC | PRN
  Administered 2021-01-29: 80 mg via INTRAVENOUS

## 2021-01-29 MED ORDER — FENTANYL CITRATE (PF) 100 MCG/2ML IJ SOLN
INTRAMUSCULAR | Status: DC | PRN
  Administered 2021-01-29: 50 ug via INTRAVENOUS

## 2021-01-29 MED ORDER — LACTATED RINGERS IV SOLN: INTRAVENOUS | Status: DC

## 2021-01-29 MED ORDER — PROPOFOL 10 MG/ML IV BOLUS
INTRAVENOUS | Status: DC | PRN
  Administered 2021-01-29: 150 mg via INTRAVENOUS

## 2021-01-29 MED ORDER — DEXTROSE 50 % IV SOLN
12.5000 g | INTRAVENOUS | Status: AC
  Administered 2021-01-29: 12.5 g via INTRAVENOUS
  Filled 2021-01-29: qty 50

## 2021-01-29 MED ORDER — FENTANYL CITRATE (PF) 100 MCG/2ML IJ SOLN
INTRAMUSCULAR | Status: AC
  Filled 2021-01-29: qty 2

## 2021-01-29 MED ORDER — SPOT INK MARKER SYRINGE KIT
PACK | SUBMUCOSAL | Status: AC
  Filled 2021-01-29: qty 5

## 2021-01-29 NOTE — Progress Notes (Signed)
    Subjective: CC: Not complaining of abdominal pain. Some back pain.  Having bowel movements after colonoscopy.  Objective: Vital signs in last 24 hours: Temp:  [98.2 F (36.8 C)-99 F (37.2 C)] 98.6 F (37 C) (05/07 1217) Pulse Rate:  [90-105] 91 (05/07 1217) Resp:  [15-25] 16 (05/07 1217) BP: (112-151)/(32-85) 146/85 (05/07 1217) SpO2:  [92 %-96 %] 95 % (05/07 1217) Last BM Date: 01/28/21  Intake/Output from previous day: 05/06 0701 - 05/07 0700 In: 1589.2 [I.V.:1589.2] Out: 1200 [Emesis/NG output:1200] Intake/Output this shift: Total I/O In: 600 [I.V.:600] Out: 150 [Emesis/NG output:150]  PE: Gen:  Alert, NAD, pleasant HEENT: EOM's intact, pupils equal and round Pulm: Normal rate and effort  Abd: Soft, non-distended, NGT in place with bilious output in cannister Psych: A&Ox3  Skin: no rashes noted, warm and dry   Lab Results:  Recent Labs    01/28/21 0405 01/29/21 0450  WBC 7.7 8.6  HGB 13.0 12.8  HCT 41.6 40.7  PLT 243 246   BMET Recent Labs    01/28/21 0405 01/29/21 0450  NA 148* 136  K 3.1* 3.1*  CL 112* 102  CO2 27 25  GLUCOSE 90 91  BUN 25* 13  CREATININE 0.75 0.68  CALCIUM 8.6* 8.3*   PT/INR No results for input(s): LABPROT, INR in the last 72 hours. CMP     Component Value Date/Time   NA 136 01/29/2021 0450   NA 141 11/10/2013 1449   K 3.1 (L) 01/29/2021 0450   K 3.7 11/10/2013 1449   CL 102 01/29/2021 0450   CO2 25 01/29/2021 0450   CO2 27 11/10/2013 1449   GLUCOSE 91 01/29/2021 0450   GLUCOSE 116 11/10/2013 1449   BUN 13 01/29/2021 0450   BUN 13.2 11/10/2013 1449   CREATININE 0.68 01/29/2021 0450   CREATININE 0.8 11/10/2013 1449   CALCIUM 8.3 (L) 01/29/2021 0450   CALCIUM 9.1 11/10/2013 1449   PROT 6.0 (L) 01/29/2021 0450   PROT 7.1 11/10/2013 1449   ALBUMIN 3.2 (L) 01/29/2021 0450   ALBUMIN 3.8 11/10/2013 1449   AST 15 01/29/2021 0450   AST 19 11/10/2013 1449   ALT 14 01/29/2021 0450   ALT 17 11/10/2013 1449    ALKPHOS 45 01/29/2021 0450   ALKPHOS 76 11/10/2013 1449   BILITOT 1.3 (H) 01/29/2021 0450   BILITOT 0.74 11/10/2013 1449   GFRNONAA >60 01/29/2021 0450   GFRAA >60 05/06/2020 1441   Lipase     Component Value Date/Time   LIPASE 35 01/26/2021 1830       Studies/Results: No results found.  Anti-infectives: Anti-infectives (From admission, onward)   None       Assessment/Plan Angelica [name] is an 80 y.o. female with with an obstructing ascending colon mass s/p colonoscopy with biopsy on 01/29/21.   - Appears comfortable, decompressing through incompetent ileocecal valve, having bowel function - CEA, pathology and CT chest pending - NG to LIWS - Plan for colectomy in the coming days once above workup and pathology evaluation complete   LOS: 3 days    Felicie Morn, MD  [facility] 01/29/2021, 3:39 PM Please see Amion for pager number during day hours 7:00am-4:30pm

## 2021-01-29 NOTE — Anesthesia Postprocedure Evaluation (Signed)
Anesthesia Post Note  Patient: Angelica [name]  Procedure(s) Performed: COLONOSCOPY (N/A ) BIOPSY SUBMUCOSAL TATTOO INJECTION     Patient location during evaluation: PACU Anesthesia Type: General Level of consciousness: awake and alert Pain management: pain level controlled Vital Signs Assessment: post-procedure vital signs reviewed and stable Respiratory status: spontaneous breathing, nonlabored ventilation and respiratory function stable Cardiovascular status: blood pressure returned to baseline and stable Postop Assessment: no apparent nausea or vomiting Anesthetic complications: no   No complications documented.  Last Vitals:  Vitals:   01/29/21 0940 01/29/21 0950  BP: (!) 131/52 (!) 151/81  Pulse: 96 100  Resp: (!) 23 (!) 25  Temp:    SpO2: 92% 93%    Last Pain:  Vitals:   01/29/21 0921  TempSrc: Oral  PainSc: 0-No pain                 Lynda Rainwater

## 2021-01-29 NOTE — Anesthesia Procedure Notes (Signed)
Procedure Name: Intubation Date/Time: 01/29/2021 8:49 AM Performed by: Cynda Familia, CRNA Pre-anesthesia Checklist: Patient identified, Emergency Drugs available, Suction available and Patient being monitored Patient Re-evaluated:Patient Re-evaluated prior to induction Oxygen Delivery Method: Circle System Utilized Preoxygenation: Pre-oxygenation with 100% oxygen Induction Type: IV induction Ventilation: Mask ventilation without difficulty Laryngoscope Size: Miller and 2 Tube type: Oral Tube size: 7.0 mm Number of attempts: 1 Airway Equipment and Method: Stylet Placement Confirmation: ETT inserted through vocal cords under direct vision,  positive ETCO2 and breath sounds checked- equal and bilateral Secured at: 21 cm Tube secured with: Tape Dental Injury: Teeth and Oropharynx as per pre-operative assessment  Comments: IV induction Miller- intubation AM CRNA atraumatic- mouth as preop bilat BS

## 2021-01-29 NOTE — Interval H&P Note (Signed)
History and Physical Interval Note: 79/female with abnormal CT, large bowel obstruction involving mid ascending colon for a colonoscopy(unprepped).  01/29/2021 8:11 AM  Angelica [name]  has presented today for large bowel obstruction/abnormal CT abdomen, with the diagnosis of large bowel obstruction.  The various methods of treatment have been discussed with the patient and family. After consideration of risks, benefits and other options for treatment, the patient has consented to  Procedure(s): COLONOSCOPY (N/A) as a surgical intervention.  The patient's history has been reviewed, patient examined, no change in status, stable for surgery.  I have reviewed the patient's chart and labs.  Questions were answered to the patient's satisfaction.     Ronnette Juniper

## 2021-01-29 NOTE — Transfer of Care (Signed)
Immediate Anesthesia Transfer of Care Note  Patient: Angelica [name]  Procedure(s) Performed: COLONOSCOPY (N/A ) BIOPSY SUBMUCOSAL TATTOO INJECTION  Patient Location: PACU and Endoscopy Unit  Anesthesia Type:General  Level of Consciousness: awake and alert   Airway & Oxygen Therapy: Patient Spontanous Breathing and Patient connected to face mask oxygen  Post-op Assessment: Report given to RN and Post -op Vital signs reviewed and stable  Post vital signs: Reviewed and stable  Last Vitals:  Vitals Value Taken Time  BP 112/32 01/29/21 0922  Temp 36.8 C 01/29/21 0921  Pulse 106 01/29/21 0926  Resp 18 01/29/21 0926  SpO2 94 % 01/29/21 0926  Vitals shown include unvalidated device data.  Last Pain:  Vitals:   01/29/21 0921  TempSrc: Oral  PainSc: 0-No pain      Patients Stated Pain Goal: 0 (15/95/39 [id number])  Complications: No complications documented.

## 2021-01-29 NOTE — Plan of Care (Signed)
  Problem: Education: Goal: Knowledge of General Education information will improve Description: Including pain rating scale, medication(s)/side effects and non-pharmacologic comfort measures Outcome: Progressing   Problem: Activity: Goal: Risk for activity intolerance will decrease Outcome: Progressing   Problem: Nutrition: Goal: Adequate nutrition will be maintained Outcome: Progressing   Problem: Elimination: Goal: Will not experience complications related to bowel motility Outcome: Progressing Goal: Will not experience complications related to urinary retention Outcome: Progressing   Problem: Pain Managment: Goal: General experience of comfort will improve Outcome: Progressing

## 2021-01-29 NOTE — Anesthesia Procedure Notes (Signed)
Date/Time: 01/29/2021 9:14 AM Performed by: Cynda Familia, CRNA Oxygen Delivery Method: Simple face mask Placement Confirmation: positive ETCO2 and breath sounds checked- equal and bilateral Dental Injury: Teeth and Oropharynx as per pre-operative assessment

## 2021-01-29 NOTE — Progress Notes (Signed)
Patient in endoscopy during rounds this morning.  Surgery team will follow up colonoscopy results closely.

## 2021-01-29 NOTE — Op Note (Signed)
[facility] Patient Name: Angelica [name] Procedure Date: 01/29/2021 MRN: [id number] Attending MD: Ronnette Juniper , MD Date of Birth: Jun 22, 1941 CSN: [id number] Age: 80 Admit Type: Inpatient Procedure:                Colonoscopy Indications:              Last colonoscopy: 2016, Abnormal CT of the GI                            tract, Weight loss, high grade obstructoin noted in                            ascending colon, r/o mass Providers:                Ronnette Juniper, MD, Josie Dixon, RN, Benetta Spar, Technician, Glenis Smoker, CRNA Referring MD:             [facility], [facility] Medicines:                Monitored Anesthesia Care Complications:            No immediate complications. Estimated blood loss:                            Minimal. Estimated Blood Loss:     Estimated blood loss was minimal. Procedure:                Pre-Anesthesia Assessment:                           - Prior to the procedure, a History and Physical                            was performed, and patient medications and                            allergies were reviewed. The patient's tolerance of                            previous anesthesia was also reviewed. The risks                            and benefits of the procedure and the sedation                            options and risks were discussed with the patient.                            All questions were answered, and informed consent                            was obtained. Prior Anticoagulants: The patient has  taken no previous anticoagulant or antiplatelet                            agents. ASA Grade Assessment: III - A patient with                            severe systemic disease. After reviewing the risks                            and benefits, the patient was deemed in                            satisfactory condition to undergo the procedure.                            After obtaining informed consent, the colonoscope                            was passed under direct vision. Throughout the                            procedure, the patient's blood pressure, pulse, and                            oxygen saturations were monitored continuously. The                            [id number]-H190DL ([id number] Olympus pediatric colonscope                            was introduced through the anus and advanced to the                            the ascending colon. The colonoscopy was performed                            without difficulty. The patient tolerated the                            procedure well. The quality of the bowel                            preparation was poor. Scope In: 8:52:52 AM Scope Out: 9:08:52 AM Total Procedure Duration: 0 hours 16 minutes 0 seconds  Findings:      The perianal and digital rectal examinations were normal.      A frond-like/villous, fungating, infiltrative and sessile completely       obstructing mass was found in the distal ascending colon. The mass was       circumferential. No bleeding was present. This was biopsied with a cold       forceps for histology. Area was tattooed with an injection of 5 mL of       Spot (carbon black).      The scope could be advanced beyond 80 cm from insertion, likely in  the       area of distal ascending colon.      Extensive amounts of semi-solid solid stool was found in the entire       colon, precluding visualization.      Multiple small and large-mouthed diverticula were found in the sigmoid       colon and descending colon. Impression:               - Preparation of the colon was poor.                           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 Biopsied. Tattooed.                           - Stool in the entire examined colon.                           - Diverticulosis in the sigmoid colon and in the                             descending colon. Moderate Sedation:      Patient did not receive moderate sedation for this procedure, but       instead received monitored anesthesia care. Recommendation:           - Await pathology results.                           - Patient will need surgery for obstructive                            ascending colon mass.                           - Will order CEA.                           - Continue NG tube suction. Procedure Code(s):        --- Professional ---                           [phone], 52, Colonoscopy, flexible; with directed                            submucosal injection(s), any substance                           [id number], 92, Colonoscopy, flexible; with biopsy,                            single or multiple Diagnosis Code(s):        --- Professional ---                           D49.0, Neoplasm of unspecified behavior of  digestive system                           K56.691, Other complete intestinal obstruction                           R63.4, Abnormal weight loss                           K57.30, Diverticulosis of large intestine without                            perforation or abscess without bleeding                           R93.3, Abnormal findings on diagnostic imaging of                            other parts of digestive tract CPT copyright 2019 American Medical Association. All rights reserved. The codes documented in this report are preliminary and upon coder review may  be revised to meet current compliance requirements. Ronnette Juniper, MD 01/29/2021 9:18:40 AM This report has been signed electronically. Number of Addenda: 0

## 2021-01-29 NOTE — Progress Notes (Signed)
PROGRESS NOTE    Angelica [name]  TML:[id number] DOB: 31-Jan-1941 DOA: 01/26/2021 PCP: Velna Hatchet, MD   Brief Narrative:  Angelica [name] is a 80 y.o. female with medical history significant for remote hx of thyroid cancer, celiac disease, rectal bleed, COPD, HTN, iron deficiency anemia who presents with abdominal pain, bloating and persistent nausea and vomiting. Patient recently had CT of the abdomen done 4/14 showing abnormal thickening and luminal narrowing of the cecum and ascending colon with differential including carcinoid, IBD or focal colitis.  She had plan colonoscopy with GI in June however presents today given worsening symptoms. In ED: CT showing high-grade proximal large bowel obstruction involving the mid ascending colon. Patient was initially hypotensive with tachycardic tachypneic.  States her symptoms have largely resolved. Surgery Dr. Dema Severin has evaluated patient at bedside and recommends NG tube placement and GI consult.    Assessment & Plan:   Principal Problem:   Large bowel obstruction (HCC) Active Problems:   Hypokalemia   COPD (chronic obstructive pulmonary disease)- on nocturnal oxygen at home   Acquired hypothyroidism   Hypotension   Hyperglycemia  Large bowel obstruction - General surgery, GI following, appreciate insight and recommendations - Lower endoscopy 01/29/21 remarkable for ascending colonic mass - biopsy pending - NG tube to low intermittent suction ongoing - output improving over the past 24 hours - Continue IV fluids in the setting of n.p.o. status - Abdominal pain markedly improving after NG tube placement  SIRs criteria without sepsis; likely reactive secondary to above, POA Lactic acidosis - Presented with tachycardia, tachypnea and has leukocytosis but no clear source - Resolved with supportive care and pain control confirming likely reactive status - Continue IV fluids in the setting of likely poor p.o. intake dehydration secondary to  above  Hypokalemia - Repleted, follow repeat labs  Hyperglycemia - Hemoglobin A1c - 6.0 - Likely reactive - Sensitive sliding scale  Hypotension - In the setting of poor p.o. intake and profound dehydration secondary to above - Continue IV fluids  Hypothyroidism w/ remote history of thyroid cancer -Continue levothyroxine IV  COPD - PRN O2 at night at baseline   DVT prophylaxis: SCDs Code Status: Full Family Communication: None present  Status is: Inpatient  Dispo: The patient is from: Home              Anticipated d/c is to: Home              Anticipated d/c date is: > 72 hours              Patient currently not medically stable for discharge  Consultants:   GI, general surgery  Procedures:   None planned  Antimicrobials:  None indicated  Subjective: No acute issues or events overnight reports marked improvement in abdominal pain and distention since NG tube placement. Denies chest pain shortness of breath headache fevers or chills.  Objective: Vitals:   01/28/21 0345 01/28/21 1320 01/28/21 2225 01/29/21 0351  BP: 121/64 134/75 134/73 124/67  Pulse: 83 89 90 90  Resp: (!) 22 18 16 15   Temp: 98.6 F (37 C) 98.3 F (36.8 C) 98.5 F (36.9 C) 98.8 F (37.1 C)  TempSrc: Oral Oral Oral Oral  SpO2: 93% 93% 94% 93%  Weight:      Height:        Intake/Output Summary (Last 24 hours) at 01/29/2021 0725 Last data filed at 01/28/2021 2208 Gross per 24 hour  Intake --  Output 1200 ml  Net -  1200 ml   Filed Weights   01/26/21 1838  Weight: 81.6 kg    Examination:  General:  Pleasantly resting in bed, No acute distress. HEENT:  Normocephalic atraumatic.  Sclerae nonicteric, noninjected.  Extraocular movements intact bilaterally.  NG tube draining dark brown liquid Neck:  Without mass or deformity.  Trachea is midline. Lungs:  Clear to auscultate bilaterally without rhonchi, wheeze, or rales. Heart:  Regular rate and rhythm.  Without murmurs, rubs, or  gallops. Abdomen:  Soft, moderately tender diffusely, nondistended.  Without guarding or rebound. Extremities: Without cyanosis, clubbing, edema, or obvious deformity. Vascular:  Dorsalis pedis and posterior tibial pulses palpable bilaterally. Skin:  Warm and dry, no erythema, no ulcerations.   Data Reviewed: I have personally reviewed following labs and imaging studies  CBC: Recent Labs  Lab 01/26/21 1830 01/27/21 0433 01/28/21 0405 01/29/21 0450  WBC 14.7* 8.7 7.7 8.6  NEUTROABS 12.5*  --   --   --   HGB 16.3* 13.1 13.0 12.8  HCT 50.2* 41.2 41.6 40.7  MCV 86.1 89.6 91.0 90.0  PLT 353 268 243 505   Basic Metabolic Panel: Recent Labs  Lab 01/26/21 1830 01/27/21 0433 01/28/21 0405 01/29/21 0450  NA 140 145 148* 136  K 3.3* 3.2* 3.1* 3.1*  CL 101 109 112* 102  CO2 23 25 27 25   GLUCOSE 207* 126* 90 91  BUN 26* 27* 25* 13  CREATININE 1.59* 1.01* 0.75 0.68  CALCIUM 10.1 8.7* 8.6* 8.3*   GFR: Estimated Creatinine Clearance: 55.8 mL/min (by C-G formula based on SCr of 0.68 mg/dL). Liver Function Tests: Recent Labs  Lab 01/26/21 1830 01/28/21 0405 01/29/21 0450  AST 37 16 15  ALT 29 15 14   ALKPHOS 59 48 45  BILITOT 1.4* 1.2 1.3*  PROT 8.5* 6.0* 6.0*  ALBUMIN 4.6 3.3* 3.2*   Recent Labs  Lab 01/26/21 1830  LIPASE 35   No results for input(s): AMMONIA in the last 168 hours. Coagulation Profile: No results for input(s): INR, PROTIME in the last 168 hours. Cardiac Enzymes: No results for input(s): CKTOTAL, CKMB, CKMBINDEX, TROPONINI in the last 168 hours. BNP (last 3 results) No results for input(s): PROBNP in the last 8760 hours. HbA1C: Recent Labs    01/27/21 0433  HGBA1C 6.0*   CBG: Recent Labs  Lab 01/28/21 1220 01/28/21 1624 01/28/21 2221 01/29/21 0308 01/29/21 0348  GLUCAP 81 82 76 68* 120*   Lipid Profile: No results for input(s): CHOL, HDL, LDLCALC, TRIG, CHOLHDL, LDLDIRECT in the last 72 hours. Thyroid Function Tests: No results for  input(s): TSH, T4TOTAL, FREET4, T3FREE, THYROIDAB in the last 72 hours. Anemia Panel: No results for input(s): VITAMINB12, FOLATE, FERRITIN, TIBC, IRON, RETICCTPCT in the last 72 hours. Sepsis Labs: Recent Labs  Lab 01/26/21 1900 01/26/21 2044 01/27/21 0433 01/28/21 0405 01/29/21 0450  PROCALCITON  --   --  <0.10 <0.10 <0.10  LATICACIDVEN 4.1* 2.6* 1.1  --   --     Recent Results (from the past 240 hour(s))  Resp Panel by RT-PCR (Flu A&B, Covid) Nasopharyngeal Swab     Status: None   Collection Time: 01/26/21 10:54 PM   Specimen: Nasopharyngeal Swab; Nasopharyngeal(NP) swabs in vial transport medium  Result Value Ref Range Status   SARS Coronavirus 2 by RT PCR NEGATIVE NEGATIVE Final    Comment: (NOTE) SARS-CoV-2 target nucleic acids are NOT DETECTED.  The SARS-CoV-2 RNA is generally detectable in upper respiratory specimens during the acute phase of infection. The lowest concentration  of SARS-CoV-2 viral copies this assay can detect is 138 copies/mL. A negative result does not preclude SARS-Cov-2 infection and should not be used as the sole basis for treatment or other patient management decisions. A negative result may occur with  improper specimen collection/handling, submission of specimen other than nasopharyngeal swab, presence of viral mutation(s) within the areas targeted by this assay, and inadequate number of viral copies(<138 copies/mL). A negative result must be combined with clinical observations, patient history, and epidemiological information. The expected result is Negative.  Fact Sheet for Patients:  EntrepreneurPulse.com.au  Fact Sheet for Healthcare Providers:  IncredibleEmployment.be  This test is no t yet approved or cleared by the [place] FDA and  has been authorized for detection and/or diagnosis of SARS-CoV-2 by FDA under an Emergency Use Authorization (EUA). This EUA will remain  in effect (meaning this  test can be used) for the duration of the COVID-19 declaration under Section 564(b)(1) of the Act, 21 U.S.C.section 360bbb-3(b)(1), unless the authorization is terminated  or revoked sooner.       Influenza A by PCR NEGATIVE NEGATIVE Final   Influenza B by PCR NEGATIVE NEGATIVE Final    Comment: (NOTE) The Xpert Xpress SARS-CoV-2/FLU/RSV plus assay is intended as an aid in the diagnosis of influenza from Nasopharyngeal swab specimens and should not be used as a sole basis for treatment. Nasal washings and aspirates are unacceptable for Xpert Xpress SARS-CoV-2/FLU/RSV testing.  Fact Sheet for Patients: EntrepreneurPulse.com.au  Fact Sheet for Healthcare Providers: IncredibleEmployment.be  This test is not yet approved or cleared by the [place] FDA and has been authorized for detection and/or diagnosis of SARS-CoV-2 by FDA under an Emergency Use Authorization (EUA). This EUA will remain in effect (meaning this test can be used) for the duration of the COVID-19 declaration under Section 564(b)(1) of the Act, 21 U.S.C. section 360bbb-3(b)(1), unless the authorization is terminated or revoked.  Performed at [facility], [place] [address], [place], [place] [address]     Radiology Studies: DG INTRO LONG GI TUBE  Result Date: 01/27/2021 CLINICAL DATA:  Small-bowel obstruction. Feeding tube for decompression. EXAM: FL FEEDING TUBE PLACEMENT CONTRAST:  50 mL of Omnipaque 300 through the NG tube FLUOROSCOPY TIME:  Fluoroscopy Time:  3 minutes 12 second Radiation Exposure Index (if provided by the fluoroscopic device): Number of Acquired Spot Images: 2 COMPARISON:  CT abdomen pelvis 01/26/2021 FINDINGS: Review of the prior CT reveals a large hiatal hernia and small-bowel obstruction. NG tube passes through the right nares after lubrication and anesthesia with lidocaine lubricant. NG tube placed into the hiatal hernia. It was difficult  to enter the stomach below the hernia. Addition of the Amplatz wire and injection of contrast eventually allowed the tube to pass into the stomach. Final tube position is in the antrum of the stomach. IMPRESSION: Large hiatal hernia. Feeding tube placed with the tip in the antrum of the stomach. Electronically Signed   By: Franchot Gallo M.D.   On: 01/27/2021 10:20     Scheduled Meds: . insulin aspart  0-9 Units Subcutaneous TID WC  . [START ON 01/30/2021] levothyroxine  50 mcg Intravenous Daily  . pantoprazole  40 mg Oral Daily   Continuous Infusions: . sodium chloride    . lactated ringers 75 mL/hr at 01/28/21 2150     LOS: 3 days   Time spent: 14mn  Nida Manfredi C Chemere Steffler, DO [facility]  If 7PM-7AM, please contact night-coverage [email or web address]  01/29/2021, 7:25 AM

## 2021-01-29 NOTE — Brief Op Note (Signed)
01/26/2021 - 01/29/2021  9:18 AM  PATIENT:  Angelica [name]  80 y.o. female  PRE-OPERATIVE DIAGNOSIS:  large bowel obstruction  POST-OPERATIVE DIAGNOSIS:  ascneding colon mass, biopsy taken. extent reached distal ascending colon  PROCEDURE:  Procedure(s): COLONOSCOPY (N/A) BIOPSY  SURGEON:  Surgeon(s) and Role:    Ronnette Juniper, MD - Primary  PHYSICIAN ASSISTANT:   ASSISTANTS: Grace Isaac, RN, Benetta Spar, Tech   ANESTHESIA:   MAC  EBL:  0 mL   BLOOD ADMINISTERED:none  DRAINS: none   LOCAL MEDICATIONS USED:  NONE  SPECIMEN:  Biopsy / Limited Resection  DISPOSITION OF SPECIMEN:  PATHOLOGY  COUNTS:  YES  TOURNIQUET:  * No tourniquets in log *  DICTATION: .Dragon Dictation  PLAN OF CARE: Admit to inpatient   PATIENT DISPOSITION:  PACU - hemodynamically stable.   Delay start of Pharmacological VTE agent (>24hrs) due to surgical blood loss or risk of bleeding: not applicable

## 2021-01-29 NOTE — Anesthesia Preprocedure Evaluation (Addendum)
Anesthesia Evaluation  Patient identified by MRN, date of birth, ID band Patient awake    Reviewed: Allergy & Precautions, NPO status , Patient's Chart, lab work & pertinent test results  Airway Mallampati: II  TM Distance: >3 FB Neck ROM: Full    Dental no notable dental hx.    Pulmonary asthma , COPD, former smoker,    Pulmonary exam normal breath sounds clear to auscultation       Cardiovascular hypertension, Pt. on medications negative cardio ROS Normal cardiovascular exam Rhythm:Regular Rate:Normal     Neuro/Psych negative neurological ROS  negative psych ROS   GI/Hepatic Neg liver ROS, GERD  ,  Endo/Other  Hypothyroidism   Renal/GU negative Renal ROS  negative genitourinary   Musculoskeletal  (+) Arthritis , Osteoarthritis,    Abdominal (+) + obese,   Peds negative pediatric ROS (+)  Hematology negative hematology ROS (+) anemia ,   Anesthesia Other Findings   Reproductive/Obstetrics negative OB ROS                             Anesthesia Physical Anesthesia Plan  ASA: III  Anesthesia Plan: General   Post-op Pain Management:    Induction: Intravenous  PONV Risk Score and Plan: 2 and Ondansetron, Midazolam and Treatment may vary due to age or medical condition  Airway Management Planned: Simple Face Mask  Additional Equipment:   Intra-op Plan:   Post-operative Plan:   Informed Consent: I have reviewed the patients History and Physical, chart, labs and discussed the procedure including the risks, benefits and alternatives for the proposed anesthesia with the patient or authorized representative who has indicated his/her understanding and acceptance.     Dental advisory given  Plan Discussed with: CRNA  Anesthesia Plan Comments:        Anesthesia Quick Evaluation

## 2021-01-29 NOTE — Plan of Care (Signed)
  Problem: Education: Goal: Knowledge of General Education information will improve Description: Including pain rating scale, medication(s)/side effects and non-pharmacologic comfort measures Outcome: Progressing   Problem: Activity: Goal: Risk for activity intolerance will decrease Outcome: Progressing   Problem: Elimination: Goal: Will not experience complications related to urinary retention Outcome: Progressing   Problem: Pain Managment: Goal: General experience of comfort will improve Outcome: Progressing   Problem: Safety: Goal: Ability to remain free from injury will improve Outcome: Progressing   Problem: Skin Integrity: Goal: Risk for impaired skin integrity will decrease Outcome: Progressing

## 2021-01-30 ENCOUNTER — Inpatient Hospital Stay (HOSPITAL_COMMUNITY): Payer: Medicare Other

## 2021-01-30 DIAGNOSIS — K56609 Unspecified intestinal obstruction, unspecified as to partial versus complete obstruction: Secondary | ICD-10-CM | POA: Diagnosis not present

## 2021-01-30 LAB — CBC
HCT: 38.2 % (ref 36.0–46.0)
Hemoglobin: 12.4 g/dL (ref 12.0–15.0)
MCH: 28.2 pg (ref 26.0–34.0)
MCHC: 32.5 g/dL (ref 30.0–36.0)
MCV: 87 fL (ref 80.0–100.0)
Platelets: 227 10*3/uL (ref 150–400)
RBC: 4.39 MIL/uL (ref 3.87–5.11)
RDW: 13.7 % (ref 11.5–15.5)
WBC: 8.9 10*3/uL (ref 4.0–10.5)
nRBC: 0 % (ref 0.0–0.2)

## 2021-01-30 LAB — COMPREHENSIVE METABOLIC PANEL
ALT: 11 U/L (ref 0–44)
AST: 14 U/L — ABNORMAL LOW (ref 15–41)
Albumin: 3.4 g/dL — ABNORMAL LOW (ref 3.5–5.0)
Alkaline Phosphatase: 46 U/L (ref 38–126)
Anion gap: 9 (ref 5–15)
BUN: 7 mg/dL — ABNORMAL LOW (ref 8–23)
CO2: 27 mmol/L (ref 22–32)
Calcium: 8.5 mg/dL — ABNORMAL LOW (ref 8.9–10.3)
Chloride: 102 mmol/L (ref 98–111)
Creatinine, Ser: 0.75 mg/dL (ref 0.44–1.00)
GFR, Estimated: >60 mL/min (ref >60)
Glucose, Bld: 96 mg/dL (ref 70–99)
Potassium: 3.1 mmol/L — ABNORMAL LOW (ref 3.5–5.1)
Sodium: 138 mmol/L (ref 135–145)
Total Bilirubin: 1.1 mg/dL (ref 0.3–1.2)
Total Protein: 6.4 g/dL — ABNORMAL LOW (ref 6.5–8.1)

## 2021-01-30 LAB — GLUCOSE, CAPILLARY
Glucose-Capillary: 80 mg/dL (ref 70–99)
Glucose-Capillary: 101 mg/dL — ABNORMAL HIGH (ref 70–99)
Glucose-Capillary: 100 mg/dL — ABNORMAL HIGH (ref 70–99)
Glucose-Capillary: 99 mg/dL (ref 70–99)

## 2021-01-30 MED ORDER — POTASSIUM CHLORIDE 10 MEQ/100ML IV SOLN
10.0000 meq | INTRAVENOUS | Status: AC
  Administered 2021-01-30 (×4): 10 meq via INTRAVENOUS
  Filled 2021-01-30 (×4): qty 100

## 2021-01-30 MED ORDER — IOHEXOL 300 MG/ML  SOLN
75.0000 mL | Freq: Once | INTRAMUSCULAR | Status: AC | PRN
  Administered 2021-01-30: 75 mL via INTRAVENOUS

## 2021-01-30 MED ORDER — PANTOPRAZOLE SODIUM 40 MG IV SOLR
40.0000 mg | Freq: Every day | INTRAVENOUS | Status: DC
  Administered 2021-01-30 – 2021-02-07 (×9): 40 mg via INTRAVENOUS
  Filled 2021-01-30 (×9): qty 40

## 2021-01-30 NOTE — Progress Notes (Signed)
    Subjective: CC: Not complaining of abdominal pain. Some back pain.  Having bowel movements after colonoscopy.  NG decompressing well.  Objective: Vital signs in last 24 hours: Temp:  [98 F (36.7 C)-99 F (37.2 C)] 98 F (36.7 C) (05/08 0517) Pulse Rate:  [73-105] 77 (05/08 0517) Resp:  [16-25] 20 (05/08 0517) BP: (112-151)/(32-85) 146/70 (05/08 0517) SpO2:  [91 %-96 %] 91 % (05/08 0517) Last BM Date: 01/28/21  Intake/Output from previous day: 05/07 0701 - 05/08 0700 In: 600 [I.V.:600] Out: 150 [Emesis/NG output:150] Intake/Output this shift: No intake/output data recorded.  PE: Gen:  Alert, NAD, pleasant HEENT: EOM's intact, pupils equal and round Pulm: Normal rate and effort  Abd: Soft, non-distended, NGT in place with bilious output in cannister Psych: A&Ox3  Skin: no rashes noted, warm and dry   Lab Results:  Recent Labs    01/29/21 0450 01/30/21 0450  WBC 8.6 8.9  HGB 12.8 12.4  HCT 40.7 38.2  PLT 246 227   BMET Recent Labs    01/29/21 0450 01/30/21 0450  NA 136 138  K 3.1* 3.1*  CL 102 102  CO2 25 27  GLUCOSE 91 96  BUN 13 7*  CREATININE 0.68 0.75  CALCIUM 8.3* 8.5*   PT/INR No results for input(s): LABPROT, INR in the last 72 hours. CMP     Component Value Date/Time   NA 138 01/30/2021 0450   NA 141 11/10/2013 1449   K 3.1 (L) 01/30/2021 0450   K 3.7 11/10/2013 1449   CL 102 01/30/2021 0450   CO2 27 01/30/2021 0450   CO2 27 11/10/2013 1449   GLUCOSE 96 01/30/2021 0450   GLUCOSE 116 11/10/2013 1449   BUN 7 (L) 01/30/2021 0450   BUN 13.2 11/10/2013 1449   CREATININE 0.75 01/30/2021 0450   CREATININE 0.8 11/10/2013 1449   CALCIUM 8.5 (L) 01/30/2021 0450   CALCIUM 9.1 11/10/2013 1449   PROT 6.4 (L) 01/30/2021 0450   PROT 7.1 11/10/2013 1449   ALBUMIN 3.4 (L) 01/30/2021 0450   ALBUMIN 3.8 11/10/2013 1449   AST 14 (L) 01/30/2021 0450   AST 19 11/10/2013 1449   ALT 11 01/30/2021 0450   ALT 17 11/10/2013 1449   ALKPHOS 46  01/30/2021 0450   ALKPHOS 76 11/10/2013 1449   BILITOT 1.1 01/30/2021 0450   BILITOT 0.74 11/10/2013 1449   GFRNONAA >60 01/30/2021 0450   GFRAA >60 05/06/2020 1441   Lipase     Component Value Date/Time   LIPASE 35 01/26/2021 1830       Studies/Results: No results found.  Anti-infectives: Anti-infectives (From admission, onward)   None       Assessment/Plan Angelica [name] is an 80 y.o. female with with an obstructing ascending colon mass s/p colonoscopy with biopsy on 01/29/21.   - Appears comfortable, decompressing with NG through incompetent ileocecal valve, having bowel function - CEA, pathology and CT chest pending - NG to LIWS - Plan for colectomy in the coming days once above workup and pathology evaluation complete   LOS: 4 days    Felicie Morn, MD  [facility] 01/30/2021, 6:59 AM Please see Amion for pager number during day hours 7:00am-4:30pm

## 2021-01-30 NOTE — Progress Notes (Signed)
PROGRESS NOTE    Angelica [name]  FYB:[id number] DOB: 1941/04/18 DOA: 01/26/2021 PCP: Velna Hatchet, MD   Brief Narrative:  Angelica [name] is a 80 y.o. female with medical history significant for remote hx of thyroid cancer, celiac disease, rectal bleed, COPD, HTN, iron deficiency anemia who presents with abdominal pain, bloating and persistent nausea and vomiting. Patient recently had CT of the abdomen done 4/14 showing abnormal thickening and luminal narrowing of the cecum and ascending colon with differential including carcinoid, IBD or focal colitis.  She had plan colonoscopy with GI in June however presents today given worsening symptoms. In ED: CT showing high-grade proximal large bowel obstruction involving the mid ascending colon. Patient was initially hypotensive with tachycardic tachypneic.  States her symptoms have largely resolved. Surgery Dr. Dema Severin has evaluated patient at bedside and recommends NG tube placement and GI consult.    Assessment & Plan:   Principal Problem:   Large bowel obstruction (HCC) Active Problems:   Hypokalemia   COPD (chronic obstructive pulmonary disease)- on nocturnal oxygen at home   Acquired hypothyroidism   Hypotension   Hyperglycemia  Large bowel obstruction secondary to unspecified ascending colon mass, POA - General surgery, GI following, appreciate insight and recommendations - Lower endoscopy 01/29/21 remarkable for ascending colonic mass - biopsy pending for identification - NG tube to low intermittent suction ongoing - output improving over the past 24 hours - Colectomy being set up with Gen Sx in the next 24/48h pending further evaluation/as symptoms resolve - Continue IV fluids in the setting of n.p.o. status - Abdominal pain markedly improving after NG tube placement/supportive care - CT chest for staging  SIRs criteria without sepsis; likely reactive secondary to above, POA Lactic acidosis due to profound dehydration - Presented with  tachycardia, tachypnea and has leukocytosis but no clear source - Resolved with supportive care and pain control confirming likely reactive status - Continue IV fluids in the setting of likely poor p.o. intake dehydration secondary to above  Hypokalemia, ongoing secondary to above - Repleted, follow repeat labs  Hyperglycemia - Hemoglobin A1c - 6.0 - Likely reactive - Sensitive sliding scale  Hypotension - In the setting of poor p.o. intake and profound dehydration secondary to above - Continue IV fluids  Hypothyroidism w/ remote history of thyroid cancer -Continue levothyroxine IV  COPD - PRN O2 at night at baseline   DVT prophylaxis: SCDs Code Status: Full Family Communication: None present  Status is: Inpatient  Dispo: The patient is from: Home              Anticipated d/c is to: Home              Anticipated d/c date is: > 72 hours              Patient currently not medically stable for discharge  Consultants:   GI, general surgery  Procedures:   Colonoscopy 01/29/21  Colectomy pending  Antimicrobials:  None indicated  Subjective: No acute issues or events overnight reports marked improvement in abdominal pain and distention since NG tube placement. Denies chest pain shortness of breath headache fevers or chills. Somewhat anxious about biopsy results and surgery but generally in good spirits.  Objective: Vitals:   01/29/21 0950 01/29/21 1217 01/29/21 2103 01/30/21 0517  BP: (!) 151/81 (!) 146/85 138/74 (!) 146/70  Pulse: 100 91 73 77  Resp: (!) 25 16 20 20   Temp:  98.6 F (37 C) 98.3 F (36.8 C) 98 F (36.7  C)  TempSrc:  Oral Oral Oral  SpO2: 93% 95% 91% 91%  Weight:      Height:        Intake/Output Summary (Last 24 hours) at 01/30/2021 0728 Last data filed at 01/29/2021 1851 Gross per 24 hour  Intake 600 ml  Output 150 ml  Net 450 ml   Filed Weights   01/26/21 1838  Weight: 81.6 kg    Examination:  General:  Pleasantly resting in  bed, No acute distress. HEENT:  Normocephalic atraumatic.  Sclerae nonicteric, noninjected.  Extraocular movements intact bilaterally.  NG tube draining dark brown liquid Neck:  Without mass or deformity.  Trachea is midline. Lungs:  Clear to auscultate bilaterally without rhonchi, wheeze, or rales. Heart:  Regular rate and rhythm.  Without murmurs, rubs, or gallops. Abdomen:  Soft, moderately tender diffusely, nondistended.  Without guarding or rebound. Extremities: Without cyanosis, clubbing, edema, or obvious deformity. Vascular:  Dorsalis pedis and posterior tibial pulses palpable bilaterally. Skin:  Warm and dry, no erythema, no ulcerations.   Data Reviewed: I have personally reviewed following labs and imaging studies  CBC: Recent Labs  Lab 01/26/21 1830 01/27/21 0433 01/28/21 0405 01/29/21 0450 01/30/21 0450  WBC 14.7* 8.7 7.7 8.6 8.9  NEUTROABS 12.5*  --   --   --   --   HGB 16.3* 13.1 13.0 12.8 12.4  HCT 50.2* 41.2 41.6 40.7 38.2  MCV 86.1 89.6 91.0 90.0 87.0  PLT 353 268 243 246 419   Basic Metabolic Panel: Recent Labs  Lab 01/26/21 1830 01/27/21 0433 01/28/21 0405 01/29/21 0450 01/30/21 0450  NA 140 145 148* 136 138  K 3.3* 3.2* 3.1* 3.1* 3.1*  CL 101 109 112* 102 102  CO2 23 25 27 25 27   GLUCOSE 207* 126* 90 91 96  BUN 26* 27* 25* 13 7*  CREATININE 1.59* 1.01* 0.75 0.68 0.75  CALCIUM 10.1 8.7* 8.6* 8.3* 8.5*   GFR: Estimated Creatinine Clearance: 55.8 mL/min (by C-G formula based on SCr of 0.75 mg/dL). Liver Function Tests: Recent Labs  Lab 01/26/21 1830 01/28/21 0405 01/29/21 0450 01/30/21 0450  AST 37 16 15 14*  ALT 29 15 14 11   ALKPHOS 59 48 45 46  BILITOT 1.4* 1.2 1.3* 1.1  PROT 8.5* 6.0* 6.0* 6.4*  ALBUMIN 4.6 3.3* 3.2* 3.4*   Recent Labs  Lab 01/26/21 1830  LIPASE 35   No results for input(s): AMMONIA in the last 168 hours. Coagulation Profile: No results for input(s): INR, PROTIME in the last 168 hours. Cardiac Enzymes: No  results for input(s): CKTOTAL, CKMB, CKMBINDEX, TROPONINI in the last 168 hours. BNP (last 3 results) No results for input(s): PROBNP in the last 8760 hours. HbA1C: No results for input(s): HGBA1C in the last 72 hours. CBG: Recent Labs  Lab 01/29/21 0735 01/29/21 1155 01/29/21 1703 01/29/21 2123 01/30/21 0305  GLUCAP 71 83 79 93 80   Lipid Profile: No results for input(s): CHOL, HDL, LDLCALC, TRIG, CHOLHDL, LDLDIRECT in the last 72 hours. Thyroid Function Tests: No results for input(s): TSH, T4TOTAL, FREET4, T3FREE, THYROIDAB in the last 72 hours. Anemia Panel: No results for input(s): VITAMINB12, FOLATE, FERRITIN, TIBC, IRON, RETICCTPCT in the last 72 hours. Sepsis Labs: Recent Labs  Lab 01/26/21 1900 01/26/21 2044 01/27/21 0433 01/28/21 0405 01/29/21 0450  PROCALCITON  --   --  <0.10 <0.10 <0.10  LATICACIDVEN 4.1* 2.6* 1.1  --   --     Recent Results (from the past 240 hour(s))  Resp Panel by RT-PCR (Flu A&B, Covid) Nasopharyngeal Swab     Status: None   Collection Time: 01/26/21 10:54 PM   Specimen: Nasopharyngeal Swab; Nasopharyngeal(NP) swabs in vial transport medium  Result Value Ref Range Status   SARS Coronavirus 2 by RT PCR NEGATIVE NEGATIVE Final    Comment: (NOTE) SARS-CoV-2 target nucleic acids are NOT DETECTED.  The SARS-CoV-2 RNA is generally detectable in upper respiratory specimens during the acute phase of infection. The lowest concentration of SARS-CoV-2 viral copies this assay can detect is 138 copies/mL. A negative result does not preclude SARS-Cov-2 infection and should not be used as the sole basis for treatment or other patient management decisions. A negative result may occur with  improper specimen collection/handling, submission of specimen other than nasopharyngeal swab, presence of viral mutation(s) within the areas targeted by this assay, and inadequate number of viral copies(<138 copies/mL). A negative result must be combined  with clinical observations, patient history, and epidemiological information. The expected result is Negative.  Fact Sheet for Patients:  EntrepreneurPulse.com.au  Fact Sheet for Healthcare Providers:  IncredibleEmployment.be  This test is no t yet approved or cleared by the [place] FDA and  has been authorized for detection and/or diagnosis of SARS-CoV-2 by FDA under an Emergency Use Authorization (EUA). This EUA will remain  in effect (meaning this test can be used) for the duration of the COVID-19 declaration under Section 564(b)(1) of the Act, 21 U.S.C.section 360bbb-3(b)(1), unless the authorization is terminated  or revoked sooner.       Influenza A by PCR NEGATIVE NEGATIVE Final   Influenza B by PCR NEGATIVE NEGATIVE Final    Comment: (NOTE) The Xpert Xpress SARS-CoV-2/FLU/RSV plus assay is intended as an aid in the diagnosis of influenza from Nasopharyngeal swab specimens and should not be used as a sole basis for treatment. Nasal washings and aspirates are unacceptable for Xpert Xpress SARS-CoV-2/FLU/RSV testing.  Fact Sheet for Patients: EntrepreneurPulse.com.au  Fact Sheet for Healthcare Providers: IncredibleEmployment.be  This test is not yet approved or cleared by the [place] FDA and has been authorized for detection and/or diagnosis of SARS-CoV-2 by FDA under an Emergency Use Authorization (EUA). This EUA will remain in effect (meaning this test can be used) for the duration of the COVID-19 declaration under Section 564(b)(1) of the Act, 21 U.S.C. section 360bbb-3(b)(1), unless the authorization is terminated or revoked.  Performed at [facility], [place] [address], [place], [place] [address]     Radiology Studies: No results found.   Scheduled Meds: . insulin aspart  0-9 Units Subcutaneous TID WC  . levothyroxine  50 mcg Intravenous Daily  .  pantoprazole  40 mg Oral Daily   Continuous Infusions: . dextrose 5 % and 0.45 % NaCl with KCl 20 mEq/L 75 mL/hr at 01/30/21 0620     LOS: 4 days   Time spent: 102mn  Harmon Bommarito C Magin Balbi, DO [facility]  If 7PM-7AM, please contact night-coverage [email or web address]  01/30/2021, 7:28 AM

## 2021-01-31 ENCOUNTER — Inpatient Hospital Stay (HOSPITAL_COMMUNITY): Payer: Medicare Other

## 2021-01-31 ENCOUNTER — Inpatient Hospital Stay: Payer: Self-pay

## 2021-01-31 ENCOUNTER — Telehealth: Payer: Self-pay | Admitting: Hematology and Oncology

## 2021-01-31 ENCOUNTER — Encounter (HOSPITAL_COMMUNITY): Payer: Self-pay | Admitting: Gastroenterology

## 2021-01-31 DIAGNOSIS — I10 Essential (primary) hypertension: Secondary | ICD-10-CM

## 2021-01-31 DIAGNOSIS — K56609 Unspecified intestinal obstruction, unspecified as to partial versus complete obstruction: Secondary | ICD-10-CM | POA: Diagnosis not present

## 2021-01-31 LAB — COMPREHENSIVE METABOLIC PANEL
ALT: 14 U/L (ref 0–44)
AST: 17 U/L (ref 15–41)
Albumin: 3.5 g/dL (ref 3.5–5.0)
Alkaline Phosphatase: 47 U/L (ref 38–126)
Anion gap: 7 (ref 5–15)
BUN: 5 mg/dL — ABNORMAL LOW (ref 8–23)
CO2: 29 mmol/L (ref 22–32)
Calcium: 8.7 mg/dL — ABNORMAL LOW (ref 8.9–10.3)
Chloride: 102 mmol/L (ref 98–111)
Creatinine, Ser: 0.63 mg/dL (ref 0.44–1.00)
GFR, Estimated: >60 mL/min (ref >60)
Glucose, Bld: 106 mg/dL — ABNORMAL HIGH (ref 70–99)
Potassium: 3.6 mmol/L (ref 3.5–5.1)
Sodium: 138 mmol/L (ref 135–145)
Total Bilirubin: 1 mg/dL (ref 0.3–1.2)
Total Protein: 6.4 g/dL — ABNORMAL LOW (ref 6.5–8.1)

## 2021-01-31 LAB — ECHOCARDIOGRAM COMPLETE
Area-P 1/2: 2.24 cm2
Calc EF: 49.9 %
Height: 63 in
S' Lateral: 2.7 cm
Single Plane A2C EF: 53.9 %
Single Plane A4C EF: 47.4 %
Weight: 2880 oz

## 2021-01-31 LAB — CBC
HCT: 40.9 % (ref 36.0–46.0)
Hemoglobin: 13.2 g/dL (ref 12.0–15.0)
MCH: 28 pg (ref 26.0–34.0)
MCHC: 32.3 g/dL (ref 30.0–36.0)
MCV: 86.7 fL (ref 80.0–100.0)
Platelets: 220 10*3/uL (ref 150–400)
RBC: 4.72 MIL/uL (ref 3.87–5.11)
RDW: 13.8 % (ref 11.5–15.5)
WBC: 8.5 10*3/uL (ref 4.0–10.5)
nRBC: 0 % (ref 0.0–0.2)

## 2021-01-31 LAB — CEA: CEA: 5.8 ng/mL — ABNORMAL HIGH (ref 0.0–4.7)

## 2021-01-31 LAB — GLUCOSE, CAPILLARY
Glucose-Capillary: 111 mg/dL — ABNORMAL HIGH (ref 70–99)
Glucose-Capillary: 100 mg/dL — ABNORMAL HIGH (ref 70–99)
Glucose-Capillary: 95 mg/dL (ref 70–99)
Glucose-Capillary: 94 mg/dL (ref 70–99)

## 2021-01-31 LAB — PHOSPHORUS: Phosphorus: 2.9 mg/dL (ref 2.5–4.6)

## 2021-01-31 LAB — MAGNESIUM: Magnesium: 1.5 mg/dL — ABNORMAL LOW (ref 1.7–2.4)

## 2021-01-31 LAB — PREALBUMIN: Prealbumin: 9.8 mg/dL — ABNORMAL LOW (ref 18–38)

## 2021-01-31 MED ORDER — TRAVASOL 10 % IV SOLN
INTRAVENOUS | Status: AC
  Filled 2021-01-31: qty 374.4

## 2021-01-31 MED ORDER — INSULIN ASPART 100 UNIT/ML IJ SOLN
0.0000 [IU] | Freq: Four times a day (QID) | INTRAMUSCULAR | Status: DC
  Administered 2021-02-01 – 2021-02-02 (×2): 1 [IU] via SUBCUTANEOUS
  Administered 2021-02-02: 3 [IU] via SUBCUTANEOUS
  Administered 2021-02-03 (×2): 2 [IU] via SUBCUTANEOUS
  Administered 2021-02-03: 3 [IU] via SUBCUTANEOUS
  Administered 2021-02-03 – 2021-02-04 (×3): 2 [IU] via SUBCUTANEOUS
  Administered 2021-02-04 – 2021-02-05 (×4): 1 [IU] via SUBCUTANEOUS
  Administered 2021-02-05: 2 [IU] via SUBCUTANEOUS
  Administered 2021-02-05 – 2021-02-06 (×3): 1 [IU] via SUBCUTANEOUS
  Administered 2021-02-06: 2 [IU] via SUBCUTANEOUS
  Administered 2021-02-06 – 2021-02-07 (×2): 1 [IU] via SUBCUTANEOUS

## 2021-01-31 MED ORDER — SODIUM CHLORIDE 0.9% FLUSH: 10.0000 mL | INTRAVENOUS | Status: DC | PRN

## 2021-01-31 MED ORDER — LEVOTHYROXINE SODIUM 100 MCG/5ML IV SOLN
62.5000 ug | Freq: Every day | INTRAVENOUS | Status: DC
  Administered 2021-01-31 – 2021-02-07 (×8): 62.5 ug via INTRAVENOUS
  Filled 2021-01-31 (×8): qty 5

## 2021-01-31 MED ORDER — CHLORHEXIDINE GLUCONATE CLOTH 2 % EX PADS
6.0000 | MEDICATED_PAD | Freq: Every day | CUTANEOUS | Status: DC
  Administered 2021-02-01 – 2021-02-08 (×7): 6 via TOPICAL

## 2021-01-31 MED ORDER — MAGNESIUM SULFATE 2 GM/50ML IV SOLN
2.0000 g | Freq: Once | INTRAVENOUS | Status: AC
  Administered 2021-01-31: 2 g via INTRAVENOUS
  Filled 2021-01-31: qty 50

## 2021-01-31 MED ORDER — KCL IN DEXTROSE-NACL 20-5-0.45 MEQ/L-%-% IV SOLN
INTRAVENOUS | Status: AC
  Filled 2021-01-31: qty 1000

## 2021-01-31 MED ORDER — POTASSIUM CHLORIDE 10 MEQ/100ML IV SOLN
10.0000 meq | INTRAVENOUS | Status: AC
  Administered 2021-01-31 (×2): 10 meq via INTRAVENOUS
  Filled 2021-01-31: qty 100

## 2021-01-31 NOTE — Care Management Important Message (Signed)
Medicare IM printed remotely for Social Work team to give to the patient.

## 2021-01-31 NOTE — Progress Notes (Signed)
*  PRELIMINARY RESULTS* Echocardiogram 2D Echocardiogram has been performed.  Luisa Hart RDCS 01/31/2021, 2:40 PM

## 2021-01-31 NOTE — Progress Notes (Signed)
2 Days Post-Op  Subjective: No new complaints.  Had a BM yesterday.  600cc of output from NGT.  Gets frequent iron infusions at the cancer center  ROS: See above, otherwise other systems negative  Objective: Vital signs in last 24 hours: Temp:  [98.7 F (37.1 C)-99.1 F (37.3 C)] 98.7 F (37.1 C) (05/09 0458) Pulse Rate:  [77-78] 78 (05/09 0458) Resp:  [18-21] 21 (05/09 0458) BP: (127-143)/(62-85) 127/62 (05/09 0458) SpO2:  [94 %-95 %] 94 % (05/09 0458) Last BM Date: 01/30/21  Intake/Output from previous day: 05/08 0701 - 05/09 0700 In: 3157.8 [I.V.:2776.3; IV Piggyback:381.5] Out: 1700 [Urine:1100; Emesis/NG output:600] Intake/Output this shift: No intake/output data recorded.  PE: Gen: NAD Heart: regular Lungs: CTAB Abd: soft, NT, Nd, +Bs, mild obesity  Lab Results:  Recent Labs    01/30/21 0450 01/31/21 0439  WBC 8.9 8.5  HGB 12.4 13.2  HCT 38.2 40.9  PLT 227 220   BMET Recent Labs    01/30/21 0450 01/31/21 0439  NA 138 138  K 3.1* 3.6  CL 102 102  CO2 27 29  GLUCOSE 96 106*  BUN 7* 5*  CREATININE 0.75 0.63  CALCIUM 8.5* 8.7*   PT/INR No results for input(s): LABPROT, INR in the last 72 hours. CMP     Component Value Date/Time   NA 138 01/31/2021 0439   NA 141 11/10/2013 1449   K 3.6 01/31/2021 0439   K 3.7 11/10/2013 1449   CL 102 01/31/2021 0439   CO2 29 01/31/2021 0439   CO2 27 11/10/2013 1449   GLUCOSE 106 (H) 01/31/2021 0439   GLUCOSE 116 11/10/2013 1449   BUN 5 (L) 01/31/2021 0439   BUN 13.2 11/10/2013 1449   CREATININE 0.63 01/31/2021 0439   CREATININE 0.8 11/10/2013 1449   CALCIUM 8.7 (L) 01/31/2021 0439   CALCIUM 9.1 11/10/2013 1449   PROT 6.4 (L) 01/31/2021 0439   PROT 7.1 11/10/2013 1449   ALBUMIN 3.5 01/31/2021 0439   ALBUMIN 3.8 11/10/2013 1449   AST 17 01/31/2021 0439   AST 19 11/10/2013 1449   ALT 14 01/31/2021 0439   ALT 17 11/10/2013 1449   ALKPHOS 47 01/31/2021 0439   ALKPHOS 76 11/10/2013 1449   BILITOT  1.0 01/31/2021 0439   BILITOT 0.74 11/10/2013 1449   GFRNONAA >60 01/31/2021 0439   GFRAA >60 05/06/2020 1441   Lipase     Component Value Date/Time   LIPASE 35 01/26/2021 1830       Studies/Results: CT CHEST W CONTRAST  Result Date: 01/30/2021 CLINICAL DATA:  Colorectal cancer staging, obstructing ascending colon mass EXAM: CT CHEST WITH CONTRAST TECHNIQUE: Multidetector CT imaging of the chest was performed during intravenous contrast administration. CONTRAST:  74m OMNIPAQUE IOHEXOL 300 MG/ML  SOLN COMPARISON:  CT abdomen pelvis, 01/26/2021, CT chest, 08/11/2008 FINDINGS: Cardiovascular: No significant vascular findings. Normal heart size. No pericardial effusion. Mediastinum/Nodes: Prominent mediastinal lymph nodes, largest left hilar/AP window nodes measuring 1.8 x 1.2 cm (series 2, image 51). Moderate hiatal hernia with intrathoracic position of the gastric fundus. Status post thyroidectomy. Trachea and esophagus demonstrate no significant findings. Lungs/Pleura: There are multiple small bilateral pulmonary nodules, including a 5 mm nodule of the dependent right lower lobe (series 5, image 99), a 6 mm nodule of the posterior left upper lobe (series 5, image 52), a 6 mm nodule of the central left lower lobe (series 5, image 91) and a 5 mm nodule of the dependent left lower lobe (series 5,  image 93). No pleural effusion or pneumothorax. Upper Abdomen: No acute abnormality. Esophagogastric tube, tip positioned in the duodenal bulb. Musculoskeletal: No chest wall mass or suspicious bone lesions identified. IMPRESSION: 1. There are multiple small bilateral pulmonary nodules, measuring 6 mm and smaller, nonspecific although suspicious for pulmonary metastatic disease. These are new compared to very remote prior CT of the chest dated 08/11/2008 and likely below size threshold for confident PET-CT characterization. 2. Prominent mediastinal lymph nodes, similar in appearance to remote prior CT dated  08/11/2008 and likely benign and reactive. 3. Moderate hiatal hernia with intrathoracic position of the gastric fundus. 4. Esophagogastric tube, tip positioned in the duodenal bulb. Electronically Signed   By: Eddie Candle M.D.   On: 01/30/2021 20:44    Anti-infectives: Anti-infectives (From admission, onward)   None       Assessment/Plan H/O thyroid cancer HTN COPD, prn 2L O2 at home Iron deficiency anemia - managed by Dr. Alvy Bimler, will let her know patient is here as a courtesy.  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  Tentatively plan for tomorrow or Wednesday pending OR availability and starting TNA for nutrition given low prealbumin. -medically cleared, but will get an ECHO today since she hasn't had one prior to general anesthesia. -cont to mobilize/pulm toilet   FEN - NPO/TNA/IVFs VTE - none currently, but ok for lovenox from our standpoint ID - none currently, will get preop dose on call to OR   LOS: 5 days    Henreitta Cea , [facility] Surgery 01/31/2021, 8:37 AM Please see Amion for pager number during day hours 7:00am-4:30pm or 7:00am -11:30am on weekends

## 2021-01-31 NOTE — Progress Notes (Signed)
PHARMACY - TOTAL PARENTERAL NUTRITION CONSULT NOTE   Indication: bowel obstruction  Patient Measurements: Height: 5' 3"  (160 cm) Weight: 81.6 kg (180 lb) IBW/kg (Calculated) : 52.4 TPN AdjBW (KG): 59.7 Body mass index is 31.89 kg/m. Usual Weight:   Assessment: Patient is an 80 y.o F presented to the ED on 5/4 with c/o abdominal pain and n/v.  Abdominal CT on 5/4 showed "high-grade proximal large bowel obstruction."  Patient was placed on NPO and NGT placed on 5/5.  Colonoscopy on 5/7 showed obstructing tumor in the distal ascending colon. Plan is to proceed with colectomy soon and to start TPN on 5/9.  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 Biopsied Central access: pending PICC placement TPN start date: 5/9 -  pending PICC placement  Nutritional Goals (per RD recommendation on 5/5): Kcal:  1500-1700 kcal Protein:  70-80 grams Fluid:  >/= 1.8 L/day  Goal TPN rate is 75 mL/hr (provides 70 g of protein and 1631 kcals per day)  Current Nutrition:  - NPO  Plan:   Now:  - potassium chloride 10 meq IV x2 runs - Magnesium 2gm IV x1  - Start TPN at 40 mL/hr at 1800 - Electrolytes in TPN: Na 68mq/L, K 580m/L, Ca 48m84mL, Mg 48mE59m, and Phos 148mm69m. Cl:Ac 1:1 - Add standard MVI and trace elements to TPN - change to Sensitive q6h SSI and adjust as needed  - Reduce MIVF to 35 mL/hr at 1800 - Monitor TPN labs on Mon/Thurs  Tyrie Porzio P 01/31/2021,9:25 AM

## 2021-01-31 NOTE — Telephone Encounter (Signed)
Patient is not seen today I was informed by general surgery service about her hospitalization.  I agree with the plan to move forward with surgery I will get Dr. Burr Medico to see her in a few days to discuss oncology follow-up after her surgery

## 2021-01-31 NOTE — Progress Notes (Signed)
Peripherally Inserted Central Catheter Placement  The IV Nurse has discussed with the patient and/or persons authorized to consent for the patient, the purpose of this procedure and the potential benefits and risks involved with this procedure.  The benefits include less needle sticks, lab draws from the catheter, and the patient may be discharged home with the catheter. Risks include, but not limited to, infection, bleeding, blood clot (thrombus formation), and puncture of an artery; nerve damage and irregular heartbeat and possibility to perform a PICC exchange if needed/ordered by physician.  Alternatives to this procedure were also discussed.  Bard Power PICC patient education guide, fact sheet on infection prevention and patient information card has been provided to patient /or left at bedside.    PICC Placement Documentation  PICC Double Lumen 54/56/25 Right Basilic (Active)     PICC Double Lumen 63/89/37 PICC Right Basilic 0 cm (Active)  Indication for Insertion or Continuance of Line Administration of hyperosmolar/irritating solutions (i.e. TPN, Vancomycin, etc.) 01/31/21 1840  Exposed Catheter (cm) 0 cm 01/31/21 1840  Site Assessment Clean;Dry;Intact 01/31/21 1840  Lumen #1 Status Flushed;Blood return noted;Saline locked 01/31/21 1840  Lumen #2 Status Flushed;Blood return noted;Saline locked 01/31/21 1840  Dressing Type Transparent 01/31/21 1840  Dressing Status Clean;Dry;Intact 01/31/21 1840  Antimicrobial disc in place? Yes 01/31/21 1840  Dressing Change Due 02/07/21 01/31/21 1840       Scotty Court 01/31/2021, 6:56 PM

## 2021-01-31 NOTE — Progress Notes (Signed)
Pathology not yet resulted.  Agree with plans to proceed with surgery, given obstructive nature of mass.  [facility] will await pathology results.

## 2021-01-31 NOTE — Progress Notes (Signed)
PROGRESS NOTE    ALONNA BARTLING  PQD:[id number] DOB: [birth year] DOA: 01/26/2021 PCP: Velna Hatchet, MD   Brief Narrative:  Angelica [name] is a 80 y.o. female with medical history significant for remote hx of thyroid cancer, celiac disease, rectal bleed, COPD, HTN, iron deficiency anemia who presents with abdominal pain, bloating and persistent nausea and vomiting. Patient recently had CT of the abdomen done 4/14 showing abnormal thickening and luminal narrowing of the cecum and ascending colon with differential including carcinoid, IBD or focal colitis.  She had plan colonoscopy with GI in June however presents today given worsening symptoms. In ED: CT showing high-grade proximal large bowel obstruction involving the mid ascending colon. Patient was initially hypotensive with tachycardic tachypneic.  States her symptoms have largely resolved. Surgery Dr. Dema Severin has evaluated patient at bedside and recommends NG tube placement and GI consult.    Assessment & Plan:   Principal Problem:   Large bowel obstruction (HCC) Active Problems:   Hypokalemia   COPD (chronic obstructive pulmonary disease)- on nocturnal oxygen at home   Acquired hypothyroidism   Hypotension   Hyperglycemia   Large bowel obstruction secondary to unspecified ascending colon mass, POA - General surgery, GI following, appreciate insight and recommendations - Lower endoscopy 01/29/21 remarkable for ascending colonic mass - biopsy pending for identification - NG tube to low intermittent suction ongoing - output improving over the past 24 hours - Colectomy being set up with Gen Sx in the next 24/48h pending further evaluation/as symptoms resolve - Preoperative echocardiogram pending - Continue IV fluids in the setting of n.p.o. status - Abdominal pain markedly improving after NG tube placement/supportive care - CT chest for staging  SIRs criteria without sepsis; likely reactive secondary to above, POA Lactic acidosis due to  profound dehydration - Presented with tachycardia, tachypnea and has leukocytosis but no clear source - Resolved with supportive care and pain control confirming likely reactive status - Continue IV fluids in the setting of likely poor p.o. intake dehydration secondary to above  Hypokalemia, ongoing secondary to above - Repleted, follow repeat labs  Hyperglycemia - Hemoglobin A1c - 6.0 - Likely reactive versus prediabetes - Sensitive sliding scale  Hypotension - In the setting of poor p.o. intake and profound dehydration secondary to above - Continue IV fluids  Hypothyroidism w/ remote history of thyroid cancer -Continue levothyroxine IV  COPD - PRN O2 at night at baseline   DVT prophylaxis: SCDs Code Status: Full Family Communication: None present  Status is: Inpatient  Dispo: The patient is from: Home              Anticipated d/c is to: Home              Anticipated d/c date is: > 72 hours              Patient currently not medically stable for discharge  Consultants:   GI, general surgery  Procedures:   Colonoscopy 01/29/21  Colectomy pending  Antimicrobials:  None indicated  Subjective: No acute issues or events overnight denies nausea vomiting shortness of breath chest pain headache fevers or chills.  Objective: Vitals:   01/30/21 0517 01/30/21 1452 01/30/21 2210 01/31/21 0458  BP: (!) 146/70 134/85 (!) 143/83 127/62  Pulse: 77 78 77 78  Resp: 20 18 20  (!) 21  Temp: 98 F (36.7 C) 99.1 F (37.3 C) 98.7 F (37.1 C) 98.7 F (37.1 C)  TempSrc: Oral Oral Oral Oral  SpO2: 91% 95% 94% 94%  Weight:      Height:        Intake/Output Summary (Last 24 hours) at 01/31/2021 0743 Last data filed at 01/31/2021 0600 Gross per 24 hour  Intake 3157.78 ml  Output 1700 ml  Net 1457.78 ml   Filed Weights   01/26/21 1838  Weight: 81.6 kg    Examination:  General:  Pleasantly resting in bed, No acute distress. HEENT:  Normocephalic atraumatic.   Sclerae nonicteric, noninjected.  Extraocular movements intact bilaterally.  NG tube draining dark brown liquid Neck:  Without mass or deformity.  Trachea is midline. Lungs:  Clear to auscultate bilaterally without rhonchi, wheeze, or rales. Heart:  Regular rate and rhythm.  Without murmurs, rubs, or gallops. Abdomen:  Soft, moderately tender diffusely, nondistended.  Without guarding or rebound. Extremities: Without cyanosis, clubbing, edema, or obvious deformity. Vascular:  Dorsalis pedis and posterior tibial pulses palpable bilaterally. Skin:  Warm and dry, no erythema, no ulcerations.   Data Reviewed: I have personally reviewed following labs and imaging studies  CBC: Recent Labs  Lab 01/26/21 1830 01/27/21 0433 01/28/21 0405 01/29/21 0450 01/30/21 0450 01/31/21 0439  WBC 14.7* 8.7 7.7 8.6 8.9 8.5  NEUTROABS 12.5*  --   --   --   --   --   HGB 16.3* 13.1 13.0 12.8 12.4 13.2  HCT 50.2* 41.2 41.6 40.7 38.2 40.9  MCV 86.1 89.6 91.0 90.0 87.0 86.7  PLT 353 268 243 246 227 767   Basic Metabolic Panel: Recent Labs  Lab 01/27/21 0433 01/28/21 0405 01/29/21 0450 01/30/21 0450 01/31/21 0439  NA 145 148* 136 138 138  K 3.2* 3.1* 3.1* 3.1* 3.6  CL 109 112* 102 102 102  CO2 25 27 25 27 29   GLUCOSE 126* 90 91 96 106*  BUN 27* 25* 13 7* 5*  CREATININE 1.01* 0.75 0.68 0.75 0.63  CALCIUM 8.7* 8.6* 8.3* 8.5* 8.7*   GFR: Estimated Creatinine Clearance: 55.8 mL/min (by C-G formula based on SCr of 0.63 mg/dL). Liver Function Tests: Recent Labs  Lab 01/26/21 1830 01/28/21 0405 01/29/21 0450 01/30/21 0450 01/31/21 0439  AST 37 16 15 14* 17  ALT 29 15 14 11 14   ALKPHOS 59 48 45 46 47  BILITOT 1.4* 1.2 1.3* 1.1 1.0  PROT 8.5* 6.0* 6.0* 6.4* 6.4*  ALBUMIN 4.6 3.3* 3.2* 3.4* 3.5   Recent Labs  Lab 01/26/21 1830  LIPASE 35   No results for input(s): AMMONIA in the last 168 hours. Coagulation Profile: No results for input(s): INR, PROTIME in the last 168 hours. Cardiac  Enzymes: No results for input(s): CKTOTAL, CKMB, CKMBINDEX, TROPONINI in the last 168 hours. BNP (last 3 results) No results for input(s): PROBNP in the last 8760 hours. HbA1C: No results for input(s): HGBA1C in the last 72 hours. CBG: Recent Labs  Lab 01/30/21 0736 01/30/21 1155 01/30/21 1649 01/31/21 0236 01/31/21 0742  GLUCAP 101* 100* 99 111* 100*   Lipid Profile: No results for input(s): CHOL, HDL, LDLCALC, TRIG, CHOLHDL, LDLDIRECT in the last 72 hours. Thyroid Function Tests: No results for input(s): TSH, T4TOTAL, FREET4, T3FREE, THYROIDAB in the last 72 hours. Anemia Panel: No results for input(s): VITAMINB12, FOLATE, FERRITIN, TIBC, IRON, RETICCTPCT in the last 72 hours. Sepsis Labs: Recent Labs  Lab 01/26/21 1900 01/26/21 2044 01/27/21 0433 01/28/21 0405 01/29/21 0450  PROCALCITON  --   --  <0.10 <0.10 <0.10  LATICACIDVEN 4.1* 2.6* 1.1  --   --     Recent Results (from the  past 240 hour(s))  Resp Panel by RT-PCR (Flu A&B, Covid) Nasopharyngeal Swab     Status: None   Collection Time: 01/26/21 10:54 PM   Specimen: Nasopharyngeal Swab; Nasopharyngeal(NP) swabs in vial transport medium  Result Value Ref Range Status   SARS Coronavirus 2 by RT PCR NEGATIVE NEGATIVE Final    Comment: (NOTE) SARS-CoV-2 target nucleic acids are NOT DETECTED.  The SARS-CoV-2 RNA is generally detectable in upper respiratory specimens during the acute phase of infection. The lowest concentration of SARS-CoV-2 viral copies this assay can detect is 138 copies/mL. A negative result does not preclude SARS-Cov-2 infection and should not be used as the sole basis for treatment or other patient management decisions. A negative result may occur with  improper specimen collection/handling, submission of specimen other than nasopharyngeal swab, presence of viral mutation(s) within the areas targeted by this assay, and inadequate number of viral copies(<138 copies/mL). A negative result must  be combined with clinical observations, patient history, and epidemiological information. The expected result is Negative.  Fact Sheet for Patients:  EntrepreneurPulse.com.au  Fact Sheet for Healthcare Providers:  IncredibleEmployment.be  This test is no t yet approved or cleared by the [place] FDA and  has been authorized for detection and/or diagnosis of SARS-CoV-2 by FDA under an Emergency Use Authorization (EUA). This EUA will remain  in effect (meaning this test can be used) for the duration of the COVID-19 declaration under Section 564(b)(1) of the Act, 21 U.S.C.section 360bbb-3(b)(1), unless the authorization is terminated  or revoked sooner.       Influenza A by PCR NEGATIVE NEGATIVE Final   Influenza B by PCR NEGATIVE NEGATIVE Final    Comment: (NOTE) The Xpert Xpress SARS-CoV-2/FLU/RSV plus assay is intended as an aid in the diagnosis of influenza from Nasopharyngeal swab specimens and should not be used as a sole basis for treatment. Nasal washings and aspirates are unacceptable for Xpert Xpress SARS-CoV-2/FLU/RSV testing.  Fact Sheet for Patients: EntrepreneurPulse.com.au  Fact Sheet for Healthcare Providers: IncredibleEmployment.be  This test is not yet approved or cleared by the [place] FDA and has been authorized for detection and/or diagnosis of SARS-CoV-2 by FDA under an Emergency Use Authorization (EUA). This EUA will remain in effect (meaning this test can be used) for the duration of the COVID-19 declaration under Section 564(b)(1) of the Act, 21 U.S.C. section 360bbb-3(b)(1), unless the authorization is terminated or revoked.  Performed at [facility], [place] [address], [place],  [address]     Radiology Studies: CT CHEST W CONTRAST  Result Date: 01/30/2021 CLINICAL DATA:  Colorectal cancer staging, obstructing ascending colon mass EXAM: CT  CHEST WITH CONTRAST TECHNIQUE: Multidetector CT imaging of the chest was performed during intravenous contrast administration. CONTRAST:  45m OMNIPAQUE IOHEXOL 300 MG/ML  SOLN COMPARISON:  CT abdomen pelvis, 01/26/2021, CT chest, 08/11/2008 FINDINGS: Cardiovascular: No significant vascular findings. Normal heart size. No pericardial effusion. Mediastinum/Nodes: Prominent mediastinal lymph nodes, largest left hilar/AP window nodes measuring 1.8 x 1.2 cm (series 2, image 51). Moderate hiatal hernia with intrathoracic position of the gastric fundus. Status post thyroidectomy. Trachea and esophagus demonstrate no significant findings. Lungs/Pleura: There are multiple small bilateral pulmonary nodules, including a 5 mm nodule of the dependent right lower lobe (series 5, image 99), a 6 mm nodule of the posterior left upper lobe (series 5, image 52), a 6 mm nodule of the central left lower lobe (series 5, image 91) and a 5 mm nodule of the dependent left lower  lobe (series 5, image 93). No pleural effusion or pneumothorax. Upper Abdomen: No acute abnormality. Esophagogastric tube, tip positioned in the duodenal bulb. Musculoskeletal: No chest wall mass or suspicious bone lesions identified. IMPRESSION: 1. There are multiple small bilateral pulmonary nodules, measuring 6 mm and smaller, nonspecific although suspicious for pulmonary metastatic disease. These are new compared to very remote prior CT of the chest dated 08/11/2008 and likely below size threshold for confident PET-CT characterization. 2. Prominent mediastinal lymph nodes, similar in appearance to remote prior CT dated 08/11/2008 and likely benign and reactive. 3. Moderate hiatal hernia with intrathoracic position of the gastric fundus. 4. Esophagogastric tube, tip positioned in the duodenal bulb. Electronically Signed   By: Eddie Candle M.D.   On: 01/30/2021 20:44     Scheduled Meds: . insulin aspart  0-9 Units Subcutaneous TID WC  . levothyroxine  50 mcg  Intravenous Daily  . pantoprazole (PROTONIX) IV  40 mg Intravenous Daily   Continuous Infusions: . dextrose 5 % and 0.45 % NaCl with KCl 20 mEq/L 75 mL/hr at 01/30/21 0620     LOS: 5 days   Time spent: 66mn  Aubreyana Saltz C Sara Selvidge, DO [facility]  If 7PM-7AM, please contact night-coverage [email or web address]  01/31/2021, 7:43 AM

## 2021-02-01 DIAGNOSIS — K56609 Unspecified intestinal obstruction, unspecified as to partial versus complete obstruction: Secondary | ICD-10-CM | POA: Diagnosis not present

## 2021-02-01 LAB — COMPREHENSIVE METABOLIC PANEL
ALT: 12 U/L (ref 0–44)
AST: 15 U/L (ref 15–41)
Albumin: 3.2 g/dL — ABNORMAL LOW (ref 3.5–5.0)
Alkaline Phosphatase: 45 U/L (ref 38–126)
Anion gap: 6 (ref 5–15)
BUN: 8 mg/dL (ref 8–23)
CO2: 31 mmol/L (ref 22–32)
Calcium: 8.7 mg/dL — ABNORMAL LOW (ref 8.9–10.3)
Chloride: 102 mmol/L (ref 98–111)
Creatinine, Ser: 0.62 mg/dL (ref 0.44–1.00)
GFR, Estimated: >60 mL/min (ref >60)
Glucose, Bld: 114 mg/dL — ABNORMAL HIGH (ref 70–99)
Potassium: 3.5 mmol/L (ref 3.5–5.1)
Sodium: 139 mmol/L (ref 135–145)
Total Bilirubin: 0.7 mg/dL (ref 0.3–1.2)
Total Protein: 6.6 g/dL (ref 6.5–8.1)

## 2021-02-01 LAB — DIFFERENTIAL
Abs Immature Granulocytes: 0.04 10*3/uL (ref 0.00–0.07)
Basophils Absolute: 0 10*3/uL (ref 0.0–0.1)
Basophils Relative: 1 %
Eosinophils Absolute: 0.3 10*3/uL (ref 0.0–0.5)
Eosinophils Relative: 4 %
Immature Granulocytes: 1 %
Lymphocytes Relative: 22 %
Lymphs Abs: 1.6 10*3/uL (ref 0.7–4.0)
Monocytes Absolute: 0.8 10*3/uL (ref 0.1–1.0)
Monocytes Relative: 11 %
Neutro Abs: 4.5 10*3/uL (ref 1.7–7.7)
Neutrophils Relative %: 61 %

## 2021-02-01 LAB — PHOSPHORUS: Phosphorus: 3.7 mg/dL (ref 2.5–4.6)

## 2021-02-01 LAB — PREALBUMIN: Prealbumin: 9.9 mg/dL — ABNORMAL LOW (ref 18–38)

## 2021-02-01 LAB — MAGNESIUM: Magnesium: 2.1 mg/dL (ref 1.7–2.4)

## 2021-02-01 LAB — CBC
HCT: 41.2 % (ref 36.0–46.0)
Hemoglobin: 13.2 g/dL (ref 12.0–15.0)
MCH: 28.1 pg (ref 26.0–34.0)
MCHC: 32 g/dL (ref 30.0–36.0)
MCV: 87.8 fL (ref 80.0–100.0)
Platelets: 253 10*3/uL (ref 150–400)
RBC: 4.69 MIL/uL (ref 3.87–5.11)
RDW: 14 % (ref 11.5–15.5)
WBC: 7.3 10*3/uL (ref 4.0–10.5)
nRBC: 0 % (ref 0.0–0.2)

## 2021-02-01 LAB — SURGICAL PATHOLOGY

## 2021-02-01 LAB — GLUCOSE, CAPILLARY
Glucose-Capillary: 114 mg/dL — ABNORMAL HIGH (ref 70–99)
Glucose-Capillary: 117 mg/dL — ABNORMAL HIGH (ref 70–99)
Glucose-Capillary: 117 mg/dL — ABNORMAL HIGH (ref 70–99)
Glucose-Capillary: 132 mg/dL — ABNORMAL HIGH (ref 70–99)

## 2021-02-01 LAB — TRIGLYCERIDES: Triglycerides: 110 mg/dL (ref <150)

## 2021-02-01 MED ORDER — TRAVASOL 10 % IV SOLN
INTRAVENOUS | Status: AC
  Filled 2021-02-01: qty 672

## 2021-02-01 MED ORDER — ENOXAPARIN SODIUM 40 MG/0.4ML IJ SOSY
40.0000 mg | PREFILLED_SYRINGE | INTRAMUSCULAR | Status: DC
  Administered 2021-02-01 – 2021-02-07 (×6): 40 mg via SUBCUTANEOUS
  Filled 2021-02-01 (×6): qty 0.4

## 2021-02-01 MED ORDER — SODIUM CHLORIDE 0.9 % IV SOLN
2.0000 g | INTRAVENOUS | Status: AC
  Administered 2021-02-02: 2 g via INTRAVENOUS
  Filled 2021-02-01 (×2): qty 2

## 2021-02-01 MED ORDER — SODIUM CHLORIDE 0.9 % IV SOLN: 1.0000 g | INTRAVENOUS | Status: DC

## 2021-02-01 MED ORDER — POTASSIUM CHLORIDE 10 MEQ/100ML IV SOLN
10.0000 meq | INTRAVENOUS | Status: AC
  Administered 2021-02-01 (×3): 10 meq via INTRAVENOUS
  Filled 2021-02-01 (×3): qty 100

## 2021-02-01 MED ORDER — DEXTROSE-NACL 5-0.45 % IV SOLN: INTRAVENOUS | Status: DC

## 2021-02-01 NOTE — Progress Notes (Signed)
PHARMACY - TOTAL PARENTERAL NUTRITION CONSULT NOTE   Indication: bowel obstruction  Patient Measurements: Height: 5' 3"  (160 cm) Weight: 81.6 kg (180 lb) IBW/kg (Calculated) : 52.4 TPN AdjBW (KG): 59.7 Body mass index is 31.89 kg/m. Usual Weight:   Assessment: Patient is an 80 y.o F presented to the ED on 5/4 with c/o abdominal pain and n/v.  Abdominal CT on 5/4 showed "high-grade proximal large bowel obstruction."  Patient was placed on NPO and NGT placed on 5/5.  Colonoscopy on 5/7 showed obstructing tumor in the distal ascending colon. Plan is to proceed with colectomy on 5/11 and to start TPN on 5/9.  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 Biopsied Central access: PICC placed on 5/9 TPN start date: 5/9   Nutritional Goals (per RD recommendation on 5/10): Kcal:  1900-2100 kcal Protein:  90-100 grams Fluid:  >/= 2 L/day  Goal TPN rate is 100 mL/hr (provides 96 g of protein and 2078 kcals per day)  Current Nutrition:  - NPO  Plan:   Now:  - potassium chloride 10 meq IV x3 runs  - Increase TPN rate to 70 mL/hr at 1800 - Electrolytes in TPN: Na 102mq/L, K 572m/L, Ca 54m10mL, Mg 54mE77m, and Phos 154mm1m. Cl:Ac 1:1 - Add standard MVI and trace elements to TPN - change to Sensitive q6h SSI and adjust as needed  - Reduce  MIVF to 10  mL/hr at 1800 and take KCL out of IVF - bmet, phos and mag on 5/11 - Monitor TPN labs on Mon/Thurs  France Lusty  P 02/01/2021,7:10 AM

## 2021-02-01 NOTE — Progress Notes (Signed)
Nutrition Follow-up  DOCUMENTATION CODES:   Obesity unspecified  INTERVENTION:  - TPN advancement and management per Pharmacist. - diet advancement as medically feasible. - weigh patient today.   NUTRITION DIAGNOSIS:   Inadequate oral intake related to inability to eat as evidenced by NPO status. -ongoing  GOAL:   Patient will meet greater than or equal to 90% of their needs -unmet with current TPN rate  MONITOR:   Diet advancement,Labs,Weight trends,I & O's,Other (Comment) (TPN regimen)  REASON FOR ASSESSMENT:   Consult New TPN/TNA  ASSESSMENT:   80 y.o. female with medical history of remote hx of thyroid cancer, celiac disease, rectal bleed, COPD, HTN, and iron deficiency anemia. She presented to the ED due to abdominal pain, bloating, and persistent N/V. She had a CT abdomen on 4/14 which showed abnormal thickening and luminal narrowing of the cecum and ascending colon with differential including carcinoid, IBD, or focal colitis. A colonoscopy with GI was planned for June however presented d/t worsening symptoms. Repeat CT now showing high-grade proximal large bowel obstruction involving the mid-ascending colon.  Patient laying in bed with no family or visitors present. She reports sleeping very little over the past 48 hours and that she desires to sleep this AM.   NGT placed on 5/5 and there has been ~200 ml output this shift. Patient denies overt abdominal pain/pressure or nausea today.   She confirms that she was losing a significant amount of weight over the past 2 months d/t on and off episodes of feeling sick which led to a fear of eating. She usually has a very good appetite. She is very active and still does tasks at home such as cutting her grass.   Patient has not been weighed since admission. She is interested in knowing what her current weight is.  Double lumen PICC placed in R basilic on 5/9 and custom TPN started at 40 ml/hr. Updated estimated nutrition needs  d/t plan for OR tomorrow (5/11) and concern for possible malignancy. Will continue to monitor and adjust needs as warranted.   Per Surgery team's note this AM, patient with a partially obstructing ascending colon mass with plan for lap-assisted partial colectomy.    Labs reviewed; CBGs: 114 and 117 mg/dl, Ca: 8.7 mg/dl. Medications reviewed; sliding scale novolog, 62.5 mcg IV synthroid/day, 2 g IV Mg sulfate/day, 40 mg IV protonix/day, 10 mEq IV KCl x2 runs 5/9 and x3 runs 5/10. IVF; D5-1/2 NS-20 mEq IV KCl @ 35 ml/hr (143 kcal/24 hrs).    NUTRITION - FOCUSED PHYSICAL EXAM:  completed; no muscle or fat depletions; no edema at this time.  Diet Order:   Diet Order            Diet NPO time specified  Diet effective now                 EDUCATION NEEDS:   Not appropriate for education at this time  Skin:  Skin Assessment: Reviewed RN Assessment  Last BM:  5/8  Height:   Ht Readings from Last 1 Encounters:  01/26/21 5' 3"  (1.6 m)    Weight:   Wt Readings from Last 1 Encounters:  01/26/21 81.6 kg     Estimated Nutritional Needs:  Kcal:  1900-2100 kcal Protein:  90-100 grams Fluid:  >/= 2 L/day      Jarome Matin, MS, RD, LDN, CNSC Inpatient Clinical Dietitian RD pager # available in AMION  After hours/weekend pager # available in [facility]

## 2021-02-01 NOTE — H&P (View-Only) (Signed)
3 Days Post-Op  Subjective: Just woke up.  Feeling ok today.  No new complaints  ROS: See above, otherwise other systems negative  Objective: Vital signs in last 24 hours: Temp:  [98.3 F (36.8 C)-99.3 F (37.4 C)] 98.4 F (36.9 C) (05/10 0558) Pulse Rate:  [75-78] 75 (05/10 0558) Resp:  [17-21] 17 (05/10 0558) BP: (114-132)/(58-83) 114/58 (05/10 0558) SpO2:  [92 %-95 %] 92 % (05/10 0558) Last BM Date: 01/30/21  Intake/Output from previous day: 05/09 0701 - 05/10 0700 In: 2872.4 [P.O.:2105; I.V.:767.4] Out: 150 [Emesis/NG output:150] Intake/Output this shift: No intake/output data recorded.  PE: Gen: NAD Heart: regular Lungs: CTAB Abd: soft, NT, Nd, +Bs, mild obesity  Lab Results:  Recent Labs    01/31/21 0439 02/01/21 0340  WBC 8.5 7.3  HGB 13.2 13.2  HCT 40.9 41.2  PLT 220 253   BMET Recent Labs    01/31/21 0439 02/01/21 0340  NA 138 139  K 3.6 3.5  CL 102 102  CO2 29 31  GLUCOSE 106* 114*  BUN 5* 8  CREATININE 0.63 0.62  CALCIUM 8.7* 8.7*   PT/INR No results for input(s): LABPROT, INR in the last 72 hours. CMP     Component Value Date/Time   NA 139 02/01/2021 0340   NA 141 11/10/2013 1449   K 3.5 02/01/2021 0340   K 3.7 11/10/2013 1449   CL 102 02/01/2021 0340   CO2 31 02/01/2021 0340   CO2 27 11/10/2013 1449   GLUCOSE 114 (H) 02/01/2021 0340   GLUCOSE 116 11/10/2013 1449   BUN 8 02/01/2021 0340   BUN 13.2 11/10/2013 1449   CREATININE 0.62 02/01/2021 0340   CREATININE 0.8 11/10/2013 1449   CALCIUM 8.7 (L) 02/01/2021 0340   CALCIUM 9.1 11/10/2013 1449   PROT 6.6 02/01/2021 0340   PROT 7.1 11/10/2013 1449   ALBUMIN 3.2 (L) 02/01/2021 0340   ALBUMIN 3.8 11/10/2013 1449   AST 15 02/01/2021 0340   AST 19 11/10/2013 1449   ALT 12 02/01/2021 0340   ALT 17 11/10/2013 1449   ALKPHOS 45 02/01/2021 0340   ALKPHOS 76 11/10/2013 1449   BILITOT 0.7 02/01/2021 0340   BILITOT 0.74 11/10/2013 1449   GFRNONAA >60 02/01/2021 0340   GFRAA  >60 05/06/2020 1441   Lipase     Component Value Date/Time   LIPASE 35 01/26/2021 1830       Studies/Results: CT CHEST W CONTRAST  Result Date: 01/30/2021 CLINICAL DATA:  Colorectal cancer staging, obstructing ascending colon mass EXAM: CT CHEST WITH CONTRAST TECHNIQUE: Multidetector CT imaging of the chest was performed during intravenous contrast administration. CONTRAST:  57m OMNIPAQUE IOHEXOL 300 MG/ML  SOLN COMPARISON:  CT abdomen pelvis, 01/26/2021, CT chest, 08/11/2008 FINDINGS: Cardiovascular: No significant vascular findings. Normal heart size. No pericardial effusion. Mediastinum/Nodes: Prominent mediastinal lymph nodes, largest left hilar/AP window nodes measuring 1.8 x 1.2 cm (series 2, image 51). Moderate hiatal hernia with intrathoracic position of the gastric fundus. Status post thyroidectomy. Trachea and esophagus demonstrate no significant findings. Lungs/Pleura: There are multiple small bilateral pulmonary nodules, including a 5 mm nodule of the dependent right lower lobe (series 5, image 99), a 6 mm nodule of the posterior left upper lobe (series 5, image 52), a 6 mm nodule of the central left lower lobe (series 5, image 91) and a 5 mm nodule of the dependent left lower lobe (series 5, image 93). No pleural effusion or pneumothorax. Upper Abdomen: No acute abnormality. Esophagogastric tube, tip  positioned in the duodenal bulb. Musculoskeletal: No chest wall mass or suspicious bone lesions identified. IMPRESSION: 1. There are multiple small bilateral pulmonary nodules, measuring 6 mm and smaller, nonspecific although suspicious for pulmonary metastatic disease. These are new compared to very remote prior CT of the chest dated 08/11/2008 and likely below size threshold for confident PET-CT characterization. 2. Prominent mediastinal lymph nodes, similar in appearance to remote prior CT dated 08/11/2008 and likely benign and reactive. 3. Moderate hiatal hernia with intrathoracic position  of the gastric fundus. 4. Esophagogastric tube, tip positioned in the duodenal bulb. Electronically Signed   By: Eddie Candle M.D.   On: 01/30/2021 20:44   ECHOCARDIOGRAM COMPLETE  Result Date: 01/31/2021    ECHOCARDIOGRAM REPORT   Patient Name:   Angelica [name] Date of Exam: 01/31/2021 Medical Rec #:  [id number]     Height:       63.0 in Accession #:    [id number]    Weight:       180.0 lb Date of Birth:  80 years     BSA:          1.849 m Patient Age:    80 years      BP:           127/62 mmHg Patient Gender: F             HR:           81 bpm. Exam Location:  Inpatient Procedure: 2D Echo, Color Doppler and Cardiac Doppler Indications:    Pre op eval  History:        Patient has no prior history of Echocardiogram examinations.                 COPD; Risk Factors:Hypertension.  Sonographer:    Kandice Robinsons RDCS Referring Phys: [id number] Little Ishikawa  Sonographer Comments: Patient is morbidly obese. Image acquisition challenging due to patient body habitus and Image acquisition challenging due to respiratory motion. IMPRESSIONS  1. Left ventricular ejection fraction, by estimation, is 55 to 60%. The left ventricle has normal function. The left ventricle has no regional wall motion abnormalities. Left ventricular diastolic parameters are indeterminate.  2. Right ventricular systolic function is low normal. The right ventricular size is normal.  3. The mitral valve is abnormal. No evidence of mitral valve regurgitation.  4. The aortic valve is tricuspid. Aortic valve regurgitation is not visualized. Mild to moderate aortic valve sclerosis/calcification is present, without any evidence of aortic stenosis.  5. The inferior vena cava is normal in size with greater than 50% respiratory variability, suggesting right atrial pressure of 3 mmHg. FINDINGS  Left Ventricle: Left ventricular ejection fraction, by estimation, is 55 to 60%. The left ventricle has normal function. The left ventricle has no regional wall motion  abnormalities. The left ventricular internal cavity size was normal in size. There is  no left ventricular hypertrophy. Left ventricular diastolic parameters are indeterminate. Right Ventricle: The right ventricular size is normal. Right vetricular wall thickness was not assessed. Right ventricular systolic function is low normal. Left Atrium: Left atrial size was normal in size. Right Atrium: Right atrial size was normal in size. Pericardium: There is no evidence of pericardial effusion. Mitral Valve: The mitral valve is abnormal. There is mild thickening of the mitral valve leaflet(s). Mild mitral annular calcification. No evidence of mitral valve regurgitation. MV peak gradient, 4.7 mmHg. The mean mitral valve gradient is 2.0 mmHg. Tricuspid Valve: The tricuspid valve is normal  in structure. Tricuspid valve regurgitation is not demonstrated. Aortic Valve: The aortic valve is tricuspid. Aortic valve regurgitation is not visualized. Mild to moderate aortic valve sclerosis/calcification is present, without any evidence of aortic stenosis. Pulmonic Valve: The pulmonic valve was not well visualized. Pulmonic valve regurgitation is not visualized. Aorta: The aortic root is normal in size and structure. Venous: The inferior vena cava is normal in size with greater than 50% respiratory variability, suggesting right atrial pressure of 3 mmHg. IAS/Shunts: The interatrial septum was not assessed.  LEFT VENTRICLE PLAX 2D LVIDd:         4.00 cm     Diastology LVIDs:         2.70 cm     LV e' medial:    4.90 cm/s LV PW:         0.90 cm     LV E/e' medial:  15.7 LV IVS:        0.90 cm     LV e' lateral:   5.22 cm/s LVOT diam:     1.80 cm     LV E/e' lateral: 14.8 LVOT Area:     2.54 cm  LV Volumes (MOD) LV vol d, MOD A2C: 29.3 ml LV vol d, MOD A4C: 34.8 ml LV vol s, MOD A2C: 13.5 ml LV vol s, MOD A4C: 18.3 ml LV SV MOD A2C:     15.8 ml LV SV MOD A4C:     34.8 ml LV SV MOD BP:      16.5 ml RIGHT VENTRICLE TAPSE (M-mode): 2.1 cm  LEFT ATRIUM             Index LA diam:        2.30 cm 1.24 cm/m LA Vol (A2C):   8.8 ml  4.78 ml/m LA Vol (A4C):   48.1 ml 26.01 ml/m LA Biplane Vol: 22.0 ml 11.90 ml/m                        PULMONIC VALVE AORTA                 PV Vmax:       1.25 m/s Ao Root diam: 3.00 cm PV Vmean:      86.600 cm/s                       PV VTI:        0.190 m                       PV Peak grad:  6.2 mmHg                       PV Mean grad:  3.0 mmHg  MITRAL VALVE MV Area (PHT): 2.24 cm    SHUNTS MV Peak grad:  4.7 mmHg    Systemic Diam: 1.80 cm MV Mean grad:  2.0 mmHg MV Vmax:       1.08 m/s MV Vmean:      65.6 cm/s MV Decel Time: 338 msec MV E velocity: 77.10 cm/s MV A velocity: 87.50 cm/s MV E/A ratio:  0.88 Dorris Carnes MD Electronically signed by Dorris Carnes MD Signature Date/Time: 01/31/2021/5:43:22 PM    Final    [place] EKG SITE RITE  Result Date: 01/31/2021 If Site Rite image not attached, placement could not be confirmed due to current cardiac rhythm.   Anti-infectives: Anti-infectives (From admission, onward)  Start     Dose/Rate Route Frequency Ordered Stop   02/02/21 0600  ceFAZolin (ANCEF) IVPB 1 g/50 mL premix       Note to Pharmacy: Patient states she doesn't even know if she is allergic to PCN   1 g 100 mL/hr over 30 Minutes Intravenous On call to O.R. 02/01/21 3437 02/03/21 0559        Assessment/Plan H/O thyroid cancer HTN COPD, prn 2L O2 at home Iron deficiency anemia - managed by Dr. Alvy Bimler, will let her know patient is here as a courtesy.  Partially obstructing ascending colon mass -CEA slightly up at 5.8  -prealbumin 9.8, TNA -NGT in place and to remain in place til surgery -CT chest with possible pulm mets, but small and difficult to characterize -will need to proceed with lap assisted partial colectomy due to obstructive symptoms.  Plan for Wednesday. -medically cleared, Echo looks good overall.  EF 55-60%. -cont to mobilize/pulm toilet   FEN - NPO/TNA/IVFs VTE - none  currently, but ok for lovenox from our standpoint, may order some on call to OR ID - none currently, cefotetan pre-op.  Patient states she doesn't even know if she has an allergy to PCN.     LOS: 6 days    Henreitta Cea , [facility] 02/01/2021, 8:14 AM Please see Amion for pager number during day hours 7:00am-4:30pm or 7:00am -11:30am on weekends  Seen, agree with above. Plan OR tomorrow TNA for severe protein calorie malnutrition.   Surgery and risks reviewed.

## 2021-02-01 NOTE — Progress Notes (Signed)
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   Patient is scheduled for laparoscopic partial colectomy tomorrow.  Further management as per surgical team.  [facility] will sign off.  Please contact us if we can be of any further assistance during this hospital stay.

## 2021-02-01 NOTE — Progress Notes (Addendum)
3 Days Post-Op  Subjective: Just woke up.  Feeling ok today.  No new complaints  ROS: See above, otherwise other systems negative  Objective: Vital signs in last 24 hours: Temp:  [98.3 F (36.8 C)-99.3 F (37.4 C)] 98.4 F (36.9 C) (05/10 0558) Pulse Rate:  [75-78] 75 (05/10 0558) Resp:  [17-21] 17 (05/10 0558) BP: (114-132)/(58-83) 114/58 (05/10 0558) SpO2:  [92 %-95 %] 92 % (05/10 0558) Last BM Date: 01/30/21  Intake/Output from previous day: 05/09 0701 - 05/10 0700 In: 2872.4 [P.O.:2105; I.V.:767.4] Out: 150 [Emesis/NG output:150] Intake/Output this shift: No intake/output data recorded.  PE: Gen: NAD Heart: regular Lungs: CTAB Abd: soft, NT, Nd, +Bs, mild obesity  Lab Results:  Recent Labs    01/31/21 0439 02/01/21 0340  WBC 8.5 7.3  HGB 13.2 13.2  HCT 40.9 41.2  PLT 220 253   BMET Recent Labs    01/31/21 0439 02/01/21 0340  NA 138 139  K 3.6 3.5  CL 102 102  CO2 29 31  GLUCOSE 106* 114*  BUN 5* 8  CREATININE 0.63 0.62  CALCIUM 8.7* 8.7*   PT/INR No results for input(s): LABPROT, INR in the last 72 hours. CMP     Component Value Date/Time   NA 139 02/01/2021 0340   NA 141 11/10/2013 1449   K 3.5 02/01/2021 0340   K 3.7 11/10/2013 1449   CL 102 02/01/2021 0340   CO2 31 02/01/2021 0340   CO2 27 11/10/2013 1449   GLUCOSE 114 (H) 02/01/2021 0340   GLUCOSE 116 11/10/2013 1449   BUN 8 02/01/2021 0340   BUN 13.2 11/10/2013 1449   CREATININE 0.62 02/01/2021 0340   CREATININE 0.8 11/10/2013 1449   CALCIUM 8.7 (L) 02/01/2021 0340   CALCIUM 9.1 11/10/2013 1449   PROT 6.6 02/01/2021 0340   PROT 7.1 11/10/2013 1449   ALBUMIN 3.2 (L) 02/01/2021 0340   ALBUMIN 3.8 11/10/2013 1449   AST 15 02/01/2021 0340   AST 19 11/10/2013 1449   ALT 12 02/01/2021 0340   ALT 17 11/10/2013 1449   ALKPHOS 45 02/01/2021 0340   ALKPHOS 76 11/10/2013 1449   BILITOT 0.7 02/01/2021 0340   BILITOT 0.74 11/10/2013 1449   GFRNONAA >60 02/01/2021 0340   GFRAA  >60 05/06/2020 1441   Lipase     Component Value Date/Time   LIPASE 35 01/26/2021 1830       Studies/Results: CT CHEST W CONTRAST  Result Date: 01/30/2021 CLINICAL DATA:  Colorectal cancer staging, obstructing ascending colon mass EXAM: CT CHEST WITH CONTRAST TECHNIQUE: Multidetector CT imaging of the chest was performed during intravenous contrast administration. CONTRAST:  62m OMNIPAQUE IOHEXOL 300 MG/ML  SOLN COMPARISON:  CT abdomen pelvis, 01/26/2021, CT chest, 08/11/2008 FINDINGS: Cardiovascular: No significant vascular findings. Normal heart size. No pericardial effusion. Mediastinum/Nodes: Prominent mediastinal lymph nodes, largest left hilar/AP window nodes measuring 1.8 x 1.2 cm (series 2, image 51). Moderate hiatal hernia with intrathoracic position of the gastric fundus. Status post thyroidectomy. Trachea and esophagus demonstrate no significant findings. Lungs/Pleura: There are multiple small bilateral pulmonary nodules, including a 5 mm nodule of the dependent right lower lobe (series 5, image 99), a 6 mm nodule of the posterior left upper lobe (series 5, image 52), a 6 mm nodule of the central left lower lobe (series 5, image 91) and a 5 mm nodule of the dependent left lower lobe (series 5, image 93). No pleural effusion or pneumothorax. Upper Abdomen: No acute abnormality. Esophagogastric tube, tip  positioned in the duodenal bulb. Musculoskeletal: No chest wall mass or suspicious bone lesions identified. IMPRESSION: 1. There are multiple small bilateral pulmonary nodules, measuring 6 mm and smaller, nonspecific although suspicious for pulmonary metastatic disease. These are new compared to very remote prior CT of the chest dated 08/11/2008 and likely below size threshold for confident PET-CT characterization. 2. Prominent mediastinal lymph nodes, similar in appearance to remote prior CT dated 08/11/2008 and likely benign and reactive. 3. Moderate hiatal hernia with intrathoracic position  of the gastric fundus. 4. Esophagogastric tube, tip positioned in the duodenal bulb. Electronically Signed   By: Eddie Candle M.D.   On: 01/30/2021 20:44   ECHOCARDIOGRAM COMPLETE  Result Date: 01/31/2021    ECHOCARDIOGRAM REPORT   Patient Name:   Angelica [name] Date of Exam: 01/31/2021 Medical Rec #:  [id number]     Height:       63.0 in Accession #:    [id number]    Weight:       180.0 lb Date of Birth:  [birth year]     BSA:          1.849 m Patient Age:    80 years      BP:           127/62 mmHg Patient Gender: F             HR:           81 bpm. Exam Location:  Inpatient Procedure: 2D Echo, Color Doppler and Cardiac Doppler Indications:    Pre op eval  History:        Patient has no prior history of Echocardiogram examinations.                 COPD; Risk Factors:Hypertension.  Sonographer:    Kandice Robinsons RDCS Referring Phys: [id number] Little Ishikawa  Sonographer Comments: Patient is morbidly obese. Image acquisition challenging due to patient body habitus and Image acquisition challenging due to respiratory motion. IMPRESSIONS  1. Left ventricular ejection fraction, by estimation, is 55 to 60%. The left ventricle has normal function. The left ventricle has no regional wall motion abnormalities. Left ventricular diastolic parameters are indeterminate.  2. Right ventricular systolic function is low normal. The right ventricular size is normal.  3. The mitral valve is abnormal. No evidence of mitral valve regurgitation.  4. The aortic valve is tricuspid. Aortic valve regurgitation is not visualized. Mild to moderate aortic valve sclerosis/calcification is present, without any evidence of aortic stenosis.  5. The inferior vena cava is normal in size with greater than 50% respiratory variability, suggesting right atrial pressure of 3 mmHg. FINDINGS  Left Ventricle: Left ventricular ejection fraction, by estimation, is 55 to 60%. The left ventricle has normal function. The left ventricle has no regional wall motion  abnormalities. The left ventricular internal cavity size was normal in size. There is  no left ventricular hypertrophy. Left ventricular diastolic parameters are indeterminate. Right Ventricle: The right ventricular size is normal. Right vetricular wall thickness was not assessed. Right ventricular systolic function is low normal. Left Atrium: Left atrial size was normal in size. Right Atrium: Right atrial size was normal in size. Pericardium: There is no evidence of pericardial effusion. Mitral Valve: The mitral valve is abnormal. There is mild thickening of the mitral valve leaflet(s). Mild mitral annular calcification. No evidence of mitral valve regurgitation. MV peak gradient, 4.7 mmHg. The mean mitral valve gradient is 2.0 mmHg. Tricuspid Valve: The tricuspid valve is normal  in structure. Tricuspid valve regurgitation is not demonstrated. Aortic Valve: The aortic valve is tricuspid. Aortic valve regurgitation is not visualized. Mild to moderate aortic valve sclerosis/calcification is present, without any evidence of aortic stenosis. Pulmonic Valve: The pulmonic valve was not well visualized. Pulmonic valve regurgitation is not visualized. Aorta: The aortic root is normal in size and structure. Venous: The inferior vena cava is normal in size with greater than 50% respiratory variability, suggesting right atrial pressure of 3 mmHg. IAS/Shunts: The interatrial septum was not assessed.  LEFT VENTRICLE PLAX 2D LVIDd:         4.00 cm     Diastology LVIDs:         2.70 cm     LV e' medial:    4.90 cm/s LV PW:         0.90 cm     LV E/e' medial:  15.7 LV IVS:        0.90 cm     LV e' lateral:   5.22 cm/s LVOT diam:     1.80 cm     LV E/e' lateral: 14.8 LVOT Area:     2.54 cm  LV Volumes (MOD) LV vol d, MOD A2C: 29.3 ml LV vol d, MOD A4C: 34.8 ml LV vol s, MOD A2C: 13.5 ml LV vol s, MOD A4C: 18.3 ml LV SV MOD A2C:     15.8 ml LV SV MOD A4C:     34.8 ml LV SV MOD BP:      16.5 ml RIGHT VENTRICLE TAPSE (M-mode): 2.1 cm  LEFT ATRIUM             Index LA diam:        2.30 cm 1.24 cm/m LA Vol (A2C):   8.8 ml  4.78 ml/m LA Vol (A4C):   48.1 ml 26.01 ml/m LA Biplane Vol: 22.0 ml 11.90 ml/m                        PULMONIC VALVE AORTA                 PV Vmax:       1.25 m/s Ao Root diam: 3.00 cm PV Vmean:      86.600 cm/s                       PV VTI:        0.190 m                       PV Peak grad:  6.2 mmHg                       PV Mean grad:  3.0 mmHg  MITRAL VALVE MV Area (PHT): 2.24 cm    SHUNTS MV Peak grad:  4.7 mmHg    Systemic Diam: 1.80 cm MV Mean grad:  2.0 mmHg MV Vmax:       1.08 m/s MV Vmean:      65.6 cm/s MV Decel Time: 338 msec MV E velocity: 77.10 cm/s MV A velocity: 87.50 cm/s MV E/A ratio:  0.88 Dorris Carnes MD Electronically signed by Dorris Carnes MD Signature Date/Time: 01/31/2021/5:43:22 PM    Final    [place] EKG SITE RITE  Result Date: 01/31/2021 If Site Rite image not attached, placement could not be confirmed due to current cardiac rhythm.   Anti-infectives: Anti-infectives (From admission, onward)  Start     Dose/Rate Route Frequency Ordered Stop   02/02/21 0600  ceFAZolin (ANCEF) IVPB 1 g/50 mL premix       Note to Pharmacy: Patient states she doesn't even know if she is allergic to PCN   1 g 100 mL/hr over 30 Minutes Intravenous On call to O.R. 02/01/21 9122 02/03/21 0559        Assessment/Plan H/O thyroid cancer HTN COPD, prn 2L O2 at home Iron deficiency anemia - managed by Dr. Alvy Bimler, will let her know patient is here as a courtesy.  Partially obstructing ascending colon mass -CEA slightly up at 5.8  -prealbumin 9.8, TNA -NGT in place and to remain in place til surgery -CT chest with possible pulm mets, but small and difficult to characterize -will need to proceed with lap assisted partial colectomy due to obstructive symptoms.  Plan for Wednesday. -medically cleared, Echo looks good overall.  EF 55-60%. -cont to mobilize/pulm toilet   FEN - NPO/TNA/IVFs VTE - none  currently, but ok for lovenox from our standpoint, may order some on call to OR ID - none currently, cefotetan pre-op.  Patient states she doesn't even know if she has an allergy to PCN.     LOS: 6 days    Henreitta Cea , [facility] 02/01/2021, 8:14 AM Please see Amion for pager number during day hours 7:00am-4:30pm or 7:00am -11:30am on weekends  Seen, agree with above. Plan OR tomorrow TNA for severe protein calorie malnutrition.   Surgery and risks reviewed.

## 2021-02-01 NOTE — Progress Notes (Signed)
Angelica NOTE    Angelica [name]  EQA:[id number] DOB: Nov 04, 1940 DOA: 01/26/2021 PCP: Velna Hatchet, MD   Brief Narrative:  Angelica [name] is a 80 y.o. female with medical history significant for remote hx of thyroid cancer, celiac disease, rectal bleed, COPD, HTN, iron deficiency anemia who presents with abdominal pain, bloating and persistent nausea and vomiting. Patient recently had CT of the abdomen done 4/14 showing abnormal thickening and luminal narrowing of the cecum and ascending colon with differential including carcinoid, IBD or focal colitis.  She had plan colonoscopy with GI in June however presents today given worsening symptoms. In ED: CT showing high-grade proximal large bowel obstruction involving the mid ascending colon. Patient was initially hypotensive with tachycardic tachypneic.  States her symptoms have largely resolved. Surgery Dr. Dema Severin has evaluated patient at bedside and recommends NG tube placement and GI consult.   Assessment & Plan:   Principal Problem:   Large bowel obstruction (HCC) Active Problems:   Hypokalemia   COPD (chronic obstructive pulmonary disease)- on nocturnal oxygen at home   Acquired hypothyroidism   Hypotension   Hyperglycemia  Large bowel obstruction secondary to unspecified ascending colon mass, POA - General surgery, GI following, appreciate insight and recommendations - Lower endoscopy 01/29/21 remarkable for ascending colonic mass - biopsy pending for identification - NG tube to low intermittent suction ongoing - output improving over the past 24 hours - Colectomy being set up with Gen Sx likely on 02/02/21 pending further evaluation/as symptoms resolve - Preoperative echocardiogram EF WNL without overt dysfunction - Continue IV fluids/TPN in the setting of n.p.o. status per Gen Sx - Abdominal pain markedly improving after NG tube placement/supportive care  Hypokalemia, ongoing secondary to above - Repleted, follow repeat  labs  Hyperglycemia without history of DM - Hemoglobin A1c - 6.0 - Likely reactive versus prediabetes - Sensitive sliding scale  Hypotension - In the setting of poor p.o. intake and profound dehydration secondary to above - Continue IV fluids  Hypothyroidism w/ remote history of thyroid cancer -Continue levothyroxine IV  COPD - PRN O2 at night at baseline  SIRs criteria without sepsis; likely reactive secondary to above, POA Lactic acidosis due to profound dehydration - Presented with tachycardia, tachypnea and has leukocytosis but no clear source - Resolved with supportive care and pain control confirming likely reactive status - Continue IV fluids in the setting of likely poor p.o. intake dehydration secondary to above  DVT prophylaxis: SCDs Code Status: Full Family Communication: None present  Status is: Inpatient  Dispo: The patient is from: Home              Anticipated d/c is to: Home              Anticipated d/c date is: > 72 hours              Patient currently not medically stable for discharge  Consultants:   GI, general surgery  Procedures:   Colonoscopy 01/29/21  Colectomy pending 02/02/21  Antimicrobials:  None indicated  Subjective: No acute issues or events overnight denies nausea vomiting shortness of breath chest pain headache fevers or chills.  Objective: Vitals:   01/31/21 0458 01/31/21 1441 01/31/21 2105 02/01/21 0558  BP: 127/62 132/80 131/83 (!) 114/58  Pulse: 78 78 78 75  Resp: (!) 21 18 (!) 21 17  Temp: 98.7 F (37.1 C) 98.3 F (36.8 C) 99.3 F (37.4 C) 98.4 F (36.9 C)  TempSrc: Oral Oral Oral Oral  SpO2: 94%  93% 95% 92%  Weight:      Height:        Intake/Output Summary (Last 24 hours) at 02/01/2021 0800 Last data filed at 02/01/2021 0600 Gross per 24 hour  Intake 2872.35 ml  Output 150 ml  Net 2722.35 ml   Filed Weights   01/26/21 1838  Weight: 81.6 kg    Examination:  General:  Pleasantly resting in bed, No  acute distress. HEENT:  Normocephalic atraumatic.  Sclerae nonicteric, noninjected.  Extraocular movements intact bilaterally.  NG tube draining dark brown liquid Neck:  Without mass or deformity.  Trachea is midline. Lungs:  Clear to auscultate bilaterally without rhonchi, wheeze, or rales. Heart:  Regular rate and rhythm.  Without murmurs, rubs, or gallops. Abdomen:  Soft, moderately tender diffusely, nondistended.  Without guarding or rebound. Extremities: Without cyanosis, clubbing, edema, or obvious deformity. Vascular:  Dorsalis pedis and posterior tibial pulses palpable bilaterally. Skin:  Warm and dry, no erythema, no ulcerations.   Data Reviewed: I have personally reviewed following labs and imaging studies  CBC: Recent Labs  Lab 01/26/21 1830 01/27/21 0433 01/28/21 0405 01/29/21 0450 01/30/21 0450 01/31/21 0439 02/01/21 0340  WBC 14.7*   < > 7.7 8.6 8.9 8.5 7.3  NEUTROABS 12.5*  --   --   --   --   --  4.5  HGB 16.3*   < > 13.0 12.8 12.4 13.2 13.2  HCT 50.2*   < > 41.6 40.7 38.2 40.9 41.2  MCV 86.1   < > 91.0 90.0 87.0 86.7 87.8  PLT 353   < > 243 246 227 220 253   < > = values in this interval not displayed.   Basic Metabolic Panel: Recent Labs  Lab 01/28/21 0405 01/29/21 0450 01/30/21 0450 01/31/21 0439 02/01/21 0340  NA 148* 136 138 138 139  K 3.1* 3.1* 3.1* 3.6 3.5  CL 112* 102 102 102 102  CO2 27 25 27 29 31   GLUCOSE 90 91 96 106* 114*  BUN 25* 13 7* 5* 8  CREATININE 0.75 0.68 0.75 0.63 0.62  CALCIUM 8.6* 8.3* 8.5* 8.7* 8.7*  MG  --   --   --  1.5* 2.1  PHOS  --   --   --  2.9 3.7   GFR: Estimated Creatinine Clearance: 55.8 mL/min (by C-G formula based on SCr of 0.62 mg/dL). Liver Function Tests: Recent Labs  Lab 01/28/21 0405 01/29/21 0450 01/30/21 0450 01/31/21 0439 02/01/21 0340  AST 16 15 14* 17 15  ALT 15 14 11 14 12   ALKPHOS 48 45 46 47 45  BILITOT 1.2 1.3* 1.1 1.0 0.7  PROT 6.0* 6.0* 6.4* 6.4* 6.6  ALBUMIN 3.3* 3.2* 3.4* 3.5 3.2*    Recent Labs  Lab 01/26/21 1830  LIPASE 35   No results for input(s): AMMONIA in the last 168 hours. Coagulation Profile: No results for input(s): INR, PROTIME in the last 168 hours. Cardiac Enzymes: No results for input(s): CKTOTAL, CKMB, CKMBINDEX, TROPONINI in the last 168 hours. BNP (last 3 results) No results for input(s): PROBNP in the last 8760 hours. HbA1C: No results for input(s): HGBA1C in the last 72 hours. CBG: Recent Labs  Lab 01/31/21 0742 01/31/21 1244 01/31/21 1712 02/01/21 0004 02/01/21 0542  GLUCAP 100* 95 94 114* 117*   Lipid Profile: Recent Labs    02/01/21 0340  TRIG 110   Thyroid Function Tests: No results for input(s): TSH, T4TOTAL, FREET4, T3FREE, THYROIDAB in the last 72 hours. Anemia  Panel: No results for input(s): VITAMINB12, FOLATE, FERRITIN, TIBC, IRON, RETICCTPCT in the last 72 hours. Sepsis Labs: Recent Labs  Lab 01/26/21 1900 01/26/21 2044 01/27/21 0433 01/28/21 0405 01/29/21 0450  PROCALCITON  --   --  <0.10 <0.10 <0.10  LATICACIDVEN 4.1* 2.6* 1.1  --   --     Recent Results (from the past 240 hour(s))  Resp Panel by RT-PCR (Flu A&B, Covid) Nasopharyngeal Swab     Status: None   Collection Time: 01/26/21 10:54 PM   Specimen: Nasopharyngeal Swab; Nasopharyngeal(NP) swabs in vial transport medium  Result Value Ref Range Status   SARS Coronavirus 2 by RT PCR NEGATIVE NEGATIVE Final    Comment: (NOTE) SARS-CoV-2 target nucleic acids are NOT DETECTED.  The SARS-CoV-2 RNA is generally detectable in upper respiratory specimens during the acute phase of infection. The lowest concentration of SARS-CoV-2 viral copies this assay can detect is 138 copies/mL. A negative result does not preclude SARS-Cov-2 infection and should not be used as the sole basis for treatment or other patient management decisions. A negative result may occur with  improper specimen collection/handling, submission of specimen other than nasopharyngeal swab,  presence of viral mutation(s) within the areas targeted by this assay, and inadequate number of viral copies(<138 copies/mL). A negative result must be combined with clinical observations, patient history, and epidemiological information. The expected result is Negative.  Fact Sheet for Patients:  EntrepreneurPulse.com.au  Fact Sheet for Healthcare Providers:  IncredibleEmployment.be  This test is no t yet approved or cleared by the [place] FDA and  has been authorized for detection and/or diagnosis of SARS-CoV-2 by FDA under an Emergency Use Authorization (EUA). This EUA will remain  in effect (meaning this test can be used) for the duration of the COVID-19 declaration under Section 564(b)(1) of the Act, 21 U.S.C.section 360bbb-3(b)(1), unless the authorization is terminated  or revoked sooner.       Influenza A by PCR NEGATIVE NEGATIVE Final   Influenza B by PCR NEGATIVE NEGATIVE Final    Comment: (NOTE) The Xpert Xpress SARS-CoV-2/FLU/RSV plus assay is intended as an aid in the diagnosis of influenza from Nasopharyngeal swab specimens and should not be used as a sole basis for treatment. Nasal washings and aspirates are unacceptable for Xpert Xpress SARS-CoV-2/FLU/RSV testing.  Fact Sheet for Patients: EntrepreneurPulse.com.au  Fact Sheet for Healthcare Providers: IncredibleEmployment.be  This test is not yet approved or cleared by the [place] FDA and has been authorized for detection and/or diagnosis of SARS-CoV-2 by FDA under an Emergency Use Authorization (EUA). This EUA will remain in effect (meaning this test can be used) for the duration of the COVID-19 declaration under Section 564(b)(1) of the Act, 21 U.S.C. section 360bbb-3(b)(1), unless the authorization is terminated or revoked.  Performed at [facility], [place] [address], [place], [place] [address]      Radiology Studies: CT CHEST W CONTRAST  Result Date: 01/30/2021 CLINICAL DATA:  Colorectal cancer staging, obstructing ascending colon mass EXAM: CT CHEST WITH CONTRAST TECHNIQUE: Multidetector CT imaging of the chest was performed during intravenous contrast administration. CONTRAST:  63m OMNIPAQUE IOHEXOL 300 MG/ML  SOLN COMPARISON:  CT abdomen pelvis, 01/26/2021, CT chest, 08/11/2008 FINDINGS: Cardiovascular: No significant vascular findings. Normal heart size. No pericardial effusion. Mediastinum/Nodes: Prominent mediastinal lymph nodes, largest left hilar/AP window nodes measuring 1.8 x 1.2 cm (series 2, image 51). Moderate hiatal hernia with intrathoracic position of the gastric fundus. Status post thyroidectomy. Trachea and esophagus demonstrate no significant findings.  Lungs/Pleura: There are multiple small bilateral pulmonary nodules, including a 5 mm nodule of the dependent right lower lobe (series 5, image 99), a 6 mm nodule of the posterior left upper lobe (series 5, image 52), a 6 mm nodule of the central left lower lobe (series 5, image 91) and a 5 mm nodule of the dependent left lower lobe (series 5, image 93). No pleural effusion or pneumothorax. Upper Abdomen: No acute abnormality. Esophagogastric tube, tip positioned in the duodenal bulb. Musculoskeletal: No chest wall mass or suspicious bone lesions identified. IMPRESSION: 1. There are multiple small bilateral pulmonary nodules, measuring 6 mm and smaller, nonspecific although suspicious for pulmonary metastatic disease. These are new compared to very remote prior CT of the chest dated 08/11/2008 and likely below size threshold for confident PET-CT characterization. 2. Prominent mediastinal lymph nodes, similar in appearance to remote prior CT dated 08/11/2008 and likely benign and reactive. 3. Moderate hiatal hernia with intrathoracic position of the gastric fundus. 4. Esophagogastric tube, tip positioned in the duodenal bulb.  Electronically Signed   By: Eddie Candle M.D.   On: 01/30/2021 20:44   ECHOCARDIOGRAM COMPLETE  Result Date: 01/31/2021    ECHOCARDIOGRAM REPORT   Patient Name:   COUTNEY WILDERMUTH Porto Date of Exam: 01/31/2021 Medical Rec #:  [id number]     Height:       63.0 in Accession #:    [id number]    Weight:       180.0 lb Date of Birth:  1941/08/30     BSA:          1.849 m Patient Age:    12 years      BP:           127/62 mmHg Patient Gender: F             HR:           81 bpm. Exam Location:  Inpatient Procedure: 2D Echo, Color Doppler and Cardiac Doppler Indications:    Pre op eval  History:        Patient has no prior history of Echocardiogram examinations.                 COPD; Risk Factors:Hypertension.  Sonographer:    Kandice Robinsons RDCS Referring Phys: [id number] Little Ishikawa  Sonographer Comments: Patient is morbidly obese. Image acquisition challenging due to patient body habitus and Image acquisition challenging due to respiratory motion. IMPRESSIONS  1. Left ventricular ejection fraction, by estimation, is 55 to 60%. The left ventricle has normal function. The left ventricle has no regional wall motion abnormalities. Left ventricular diastolic parameters are indeterminate.  2. Right ventricular systolic function is low normal. The right ventricular size is normal.  3. The mitral valve is abnormal. No evidence of mitral valve regurgitation.  4. The aortic valve is tricuspid. Aortic valve regurgitation is not visualized. Mild to moderate aortic valve sclerosis/calcification is present, without any evidence of aortic stenosis.  5. The inferior vena cava is normal in size with greater than 50% respiratory variability, suggesting right atrial pressure of 3 mmHg. FINDINGS  Left Ventricle: Left ventricular ejection fraction, by estimation, is 55 to 60%. The left ventricle has normal function. The left ventricle has no regional wall motion abnormalities. The left ventricular internal cavity size was normal in size. There  is  no left ventricular hypertrophy. Left ventricular diastolic parameters are indeterminate. Right Ventricle: The right ventricular size is normal. Right vetricular wall thickness was not assessed.  Right ventricular systolic function is low normal. Left Atrium: Left atrial size was normal in size. Right Atrium: Right atrial size was normal in size. Pericardium: There is no evidence of pericardial effusion. Mitral Valve: The mitral valve is abnormal. There is mild thickening of the mitral valve leaflet(s). Mild mitral annular calcification. No evidence of mitral valve regurgitation. MV peak gradient, 4.7 mmHg. The mean mitral valve gradient is 2.0 mmHg. Tricuspid Valve: The tricuspid valve is normal in structure. Tricuspid valve regurgitation is not demonstrated. Aortic Valve: The aortic valve is tricuspid. Aortic valve regurgitation is not visualized. Mild to moderate aortic valve sclerosis/calcification is present, without any evidence of aortic stenosis. Pulmonic Valve: The pulmonic valve was not well visualized. Pulmonic valve regurgitation is not visualized. Aorta: The aortic root is normal in size and structure. Venous: The inferior vena cava is normal in size with greater than 50% respiratory variability, suggesting right atrial pressure of 3 mmHg. IAS/Shunts: The interatrial septum was not assessed.  LEFT VENTRICLE PLAX 2D LVIDd:         4.00 cm     Diastology LVIDs:         2.70 cm     LV e' medial:    4.90 cm/s LV PW:         0.90 cm     LV E/e' medial:  15.7 LV IVS:        0.90 cm     LV e' lateral:   5.22 cm/s LVOT diam:     1.80 cm     LV E/e' lateral: 14.8 LVOT Area:     2.54 cm  LV Volumes (MOD) LV vol d, MOD A2C: 29.3 ml LV vol d, MOD A4C: 34.8 ml LV vol s, MOD A2C: 13.5 ml LV vol s, MOD A4C: 18.3 ml LV SV MOD A2C:     15.8 ml LV SV MOD A4C:     34.8 ml LV SV MOD BP:      16.5 ml RIGHT VENTRICLE TAPSE (M-mode): 2.1 cm LEFT ATRIUM             Index LA diam:        2.30 cm 1.24 cm/m LA Vol (A2C):    8.8 ml  4.78 ml/m LA Vol (A4C):   48.1 ml 26.01 ml/m LA Biplane Vol: 22.0 ml 11.90 ml/m                        PULMONIC VALVE AORTA                 PV Vmax:       1.25 m/s Ao Root diam: 3.00 cm PV Vmean:      86.600 cm/s                       PV VTI:        0.190 m                       PV Peak grad:  6.2 mmHg                       PV Mean grad:  3.0 mmHg  MITRAL VALVE MV Area (PHT): 2.24 cm    SHUNTS MV Peak grad:  4.7 mmHg    Systemic Diam: 1.80 cm MV Mean grad:  2.0 mmHg MV Vmax:       1.08 m/s MV  Vmean:      65.6 cm/s MV Decel Time: 338 msec MV E velocity: 77.10 cm/s MV A velocity: 87.50 cm/s MV E/A ratio:  0.88 Dorris Carnes MD Electronically signed by Dorris Carnes MD Signature Date/Time: 01/31/2021/5:43:22 PM    Final    [place] EKG SITE RITE  Result Date: 01/31/2021 If Site Rite image not attached, placement could not be confirmed due to current cardiac rhythm.    Scheduled Meds: . Chlorhexidine Gluconate Cloth  6 each Topical Daily  . insulin aspart  0-9 Units Subcutaneous Q6H  . levothyroxine  62.5 mcg Intravenous Daily  . pantoprazole (PROTONIX) IV  40 mg Intravenous Daily   Continuous Infusions: . dextrose 5 % and 0.45 % NaCl with KCl 20 mEq/L 35 mL/hr at 01/31/21 1851  . dextrose 5 % and 0.45% NaCl    . potassium chloride    . TPN ADULT (ION) 40 mL/hr at 01/31/21 2022     LOS: 6 days   Time spent: 45mn  Loriene Taunton C Dyann Goodspeed, DO [facility]  If 7PM-7AM, please contact night-coverage [email or web address]  02/01/2021, 8:00 AM

## 2021-02-02 ENCOUNTER — Encounter (HOSPITAL_COMMUNITY): Payer: Self-pay | Admitting: Family Medicine

## 2021-02-02 ENCOUNTER — Inpatient Hospital Stay (HOSPITAL_COMMUNITY): Payer: Medicare Other | Admitting: Anesthesiology

## 2021-02-02 ENCOUNTER — Encounter (HOSPITAL_COMMUNITY)
Admission: EM | Disposition: A | Discharge status: Home Health | Payer: Self-pay | Source: Home / Self Care | Attending: Internal Medicine

## 2021-02-02 ENCOUNTER — Other Ambulatory Visit: Payer: Self-pay

## 2021-02-02 DIAGNOSIS — J410 Simple chronic bronchitis: Secondary | ICD-10-CM | POA: Diagnosis not present

## 2021-02-02 DIAGNOSIS — K56609 Unspecified intestinal obstruction, unspecified as to partial versus complete obstruction: Secondary | ICD-10-CM | POA: Diagnosis not present

## 2021-02-02 DIAGNOSIS — I9589 Other hypotension: Secondary | ICD-10-CM

## 2021-02-02 DIAGNOSIS — C189 Malignant neoplasm of colon, unspecified: Secondary | ICD-10-CM

## 2021-02-02 DIAGNOSIS — R739 Hyperglycemia, unspecified: Secondary | ICD-10-CM

## 2021-02-02 DIAGNOSIS — C182 Malignant neoplasm of ascending colon: Secondary | ICD-10-CM | POA: Insufficient documentation

## 2021-02-02 DIAGNOSIS — E861 Hypovolemia: Secondary | ICD-10-CM

## 2021-02-02 DIAGNOSIS — E876 Hypokalemia: Secondary | ICD-10-CM

## 2021-02-02 DIAGNOSIS — E039 Hypothyroidism, unspecified: Secondary | ICD-10-CM | POA: Diagnosis not present

## 2021-02-02 HISTORY — PX: LAPAROSCOPIC PARTIAL COLECTOMY: SHX5907

## 2021-02-02 LAB — BASIC METABOLIC PANEL
Anion gap: 8 (ref 5–15)
BUN: 13 mg/dL (ref 8–23)
CO2: 27 mmol/L (ref 22–32)
Calcium: 8.9 mg/dL (ref 8.9–10.3)
Chloride: 102 mmol/L (ref 98–111)
Creatinine, Ser: 0.44 mg/dL (ref 0.44–1.00)
GFR, Estimated: >60 mL/min (ref >60)
Glucose, Bld: 111 mg/dL — ABNORMAL HIGH (ref 70–99)
Potassium: 4.2 mmol/L (ref 3.5–5.1)
Sodium: 137 mmol/L (ref 135–145)

## 2021-02-02 LAB — GLUCOSE, CAPILLARY
Glucose-Capillary: 115 mg/dL — ABNORMAL HIGH (ref 70–99)
Glucose-Capillary: 129 mg/dL — ABNORMAL HIGH (ref 70–99)
Glucose-Capillary: 207 mg/dL — ABNORMAL HIGH (ref 70–99)
Glucose-Capillary: 220 mg/dL — ABNORMAL HIGH (ref 70–99)

## 2021-02-02 LAB — TYPE AND SCREEN
ABO/RH(D): A POS
Antibody Screen: NEGATIVE

## 2021-02-02 LAB — PHOSPHORUS: Phosphorus: 4.1 mg/dL (ref 2.5–4.6)

## 2021-02-02 LAB — MAGNESIUM: Magnesium: 1.8 mg/dL (ref 1.7–2.4)

## 2021-02-02 SURGERY — LAPAROSCOPIC PARTIAL COLECTOMY
Anesthesia: General

## 2021-02-02 MED ORDER — FENTANYL CITRATE (PF) 250 MCG/5ML IJ SOLN
INTRAMUSCULAR | Status: AC
  Filled 2021-02-02: qty 5

## 2021-02-02 MED ORDER — FENTANYL CITRATE (PF) 100 MCG/2ML IJ SOLN
25.0000 ug | INTRAMUSCULAR | Status: DC | PRN
  Administered 2021-02-02: 25 ug via INTRAVENOUS

## 2021-02-02 MED ORDER — LIDOCAINE HCL 1 % IJ SOLN
INTRAMUSCULAR | Status: DC | PRN
Start: 2021-02-02 — End: 2021-02-02
  Administered 2021-02-02: 7.5 mL

## 2021-02-02 MED ORDER — ROCURONIUM BROMIDE 10 MG/ML (PF) SYRINGE
PREFILLED_SYRINGE | INTRAVENOUS | Status: DC | PRN
  Administered 2021-02-02: 20 mg via INTRAVENOUS
  Administered 2021-02-02: 50 mg via INTRAVENOUS
  Administered 2021-02-02: 20 mg via INTRAVENOUS
  Administered 2021-02-02: 10 mg via INTRAVENOUS

## 2021-02-02 MED ORDER — ACETAMINOPHEN 160 MG/5ML PO SOLN: 325.0000 mg | ORAL | Status: DC | PRN

## 2021-02-02 MED ORDER — LIDOCAINE 2% (20 MG/ML) 5 ML SYRINGE
INTRAMUSCULAR | Status: AC
  Filled 2021-02-02: qty 5

## 2021-02-02 MED ORDER — LACTATED RINGERS IV SOLN: INTRAVENOUS | Status: DC | PRN

## 2021-02-02 MED ORDER — LIDOCAINE 2% (20 MG/ML) 5 ML SYRINGE
INTRAMUSCULAR | Status: DC | PRN
  Administered 2021-02-02: 20 mg via INTRAVENOUS

## 2021-02-02 MED ORDER — FENTANYL CITRATE (PF) 100 MCG/2ML IJ SOLN
INTRAMUSCULAR | Status: AC
  Filled 2021-02-02: qty 2

## 2021-02-02 MED ORDER — ROCURONIUM BROMIDE 10 MG/ML (PF) SYRINGE
PREFILLED_SYRINGE | INTRAVENOUS | Status: AC
  Filled 2021-02-02: qty 10

## 2021-02-02 MED ORDER — ACETAMINOPHEN 10 MG/ML IV SOLN: 1000.0000 mg | Freq: Once | INTRAVENOUS | Status: DC | PRN

## 2021-02-02 MED ORDER — DEXAMETHASONE SODIUM PHOSPHATE 10 MG/ML IJ SOLN
INTRAMUSCULAR | Status: AC
  Filled 2021-02-02: qty 1

## 2021-02-02 MED ORDER — OXYCODONE HCL 5 MG/5ML PO SOLN: 5.0000 mg | Freq: Once | ORAL | Status: DC | PRN

## 2021-02-02 MED ORDER — PROPOFOL 10 MG/ML IV BOLUS
INTRAVENOUS | Status: DC | PRN
  Administered 2021-02-02: 100 mg via INTRAVENOUS

## 2021-02-02 MED ORDER — OXYCODONE HCL 5 MG PO TABS: 5.0000 mg | ORAL_TABLET | Freq: Once | ORAL | Status: DC | PRN

## 2021-02-02 MED ORDER — ONDANSETRON HCL 4 MG/2ML IJ SOLN
4.0000 mg | Freq: Four times a day (QID) | INTRAMUSCULAR | Status: DC | PRN
Start: 2021-02-02 — End: 2021-02-08
  Filled 2021-02-02: qty 2

## 2021-02-02 MED ORDER — AMISULPRIDE (ANTIEMETIC) 5 MG/2ML IV SOLN: 10.0000 mg | Freq: Once | INTRAVENOUS | Status: DC | PRN

## 2021-02-02 MED ORDER — ONDANSETRON HCL 4 MG/2ML IJ SOLN
INTRAMUSCULAR | Status: AC
  Filled 2021-02-02: qty 2

## 2021-02-02 MED ORDER — SUGAMMADEX SODIUM 200 MG/2ML IV SOLN
INTRAVENOUS | Status: DC | PRN
  Administered 2021-02-02: 200 mg via INTRAVENOUS

## 2021-02-02 MED ORDER — SODIUM CHLORIDE 0.9 % IV SOLN
2.0000 g | Freq: Once | INTRAVENOUS | Status: DC
  Filled 2021-02-02: qty 2

## 2021-02-02 MED ORDER — BUPIVACAINE-EPINEPHRINE (PF) 0.25% -1:200000 IJ SOLN
INTRAMUSCULAR | Status: AC
  Filled 2021-02-02: qty 30

## 2021-02-02 MED ORDER — LIDOCAINE HCL (PF) 1 % IJ SOLN
INTRAMUSCULAR | Status: AC
  Filled 2021-02-02: qty 30

## 2021-02-02 MED ORDER — MAGNESIUM SULFATE IN D5W 1-5 GM/100ML-% IV SOLN
1.0000 g | Freq: Once | INTRAVENOUS | Status: AC
  Administered 2021-02-02: 1 g via INTRAVENOUS
  Filled 2021-02-02: qty 100

## 2021-02-02 MED ORDER — BUPIVACAINE-EPINEPHRINE 0.25% -1:200000 IJ SOLN
INTRAMUSCULAR | Status: DC | PRN
  Administered 2021-02-02: 7.5 mL

## 2021-02-02 MED ORDER — SUCCINYLCHOLINE CHLORIDE 200 MG/10ML IV SOSY
PREFILLED_SYRINGE | INTRAVENOUS | Status: DC | PRN
  Administered 2021-02-02: 100 mg via INTRAVENOUS

## 2021-02-02 MED ORDER — SUCCINYLCHOLINE CHLORIDE 200 MG/10ML IV SOSY
PREFILLED_SYRINGE | INTRAVENOUS | Status: AC
  Filled 2021-02-02: qty 10

## 2021-02-02 MED ORDER — FENTANYL CITRATE (PF) 250 MCG/5ML IJ SOLN
INTRAMUSCULAR | Status: DC | PRN
  Administered 2021-02-02 (×5): 50 ug via INTRAVENOUS

## 2021-02-02 MED ORDER — ALBUMIN HUMAN 5 % IV SOLN
INTRAVENOUS | Status: AC
  Filled 2021-02-02: qty 250

## 2021-02-02 MED ORDER — ALBUMIN HUMAN 5 % IV SOLN: INTRAVENOUS | Status: DC | PRN

## 2021-02-02 MED ORDER — DEXAMETHASONE SODIUM PHOSPHATE 10 MG/ML IJ SOLN
INTRAMUSCULAR | Status: DC | PRN
  Administered 2021-02-02: 10 mg via INTRAVENOUS

## 2021-02-02 MED ORDER — SODIUM CHLORIDE 0.9% IV SOLUTION: Freq: Once | INTRAVENOUS | Status: DC

## 2021-02-02 MED ORDER — PROPOFOL 10 MG/ML IV BOLUS
INTRAVENOUS | Status: AC
  Filled 2021-02-02: qty 20

## 2021-02-02 MED ORDER — PHENYLEPHRINE HCL-NACL 10-0.9 MG/250ML-% IV SOLN
INTRAVENOUS | Status: DC | PRN
  Administered 2021-02-02: 50 ug/min via INTRAVENOUS

## 2021-02-02 MED ORDER — ONDANSETRON HCL 4 MG/2ML IJ SOLN
INTRAMUSCULAR | Status: DC | PRN
  Administered 2021-02-02: 4 mg via INTRAVENOUS

## 2021-02-02 MED ORDER — PROMETHAZINE HCL 25 MG/ML IJ SOLN: 6.2500 mg | INTRAMUSCULAR | Status: DC | PRN

## 2021-02-02 MED ORDER — TRAVASOL 10 % IV SOLN
INTRAVENOUS | Status: AC
  Filled 2021-02-02: qty 960

## 2021-02-02 MED ORDER — ACETAMINOPHEN 325 MG PO TABS: 325.0000 mg | ORAL_TABLET | ORAL | Status: DC | PRN

## 2021-02-02 SURGICAL SUPPLY — 81 items
ADH SKN CLS APL DERMABOND .7 (GAUZE/BANDAGES/DRESSINGS) ×1
ADH SKNCLS APL OCTYL .7 VIOL (GAUZE/BANDAGES/DRESSINGS) ×1
APL PRP STRL LF DISP 70% ISPRP (MISCELLANEOUS) ×1
APPLIER CLIP 5 13 M/L LIGAMAX5 (MISCELLANEOUS)
APPLIER CLIP ROT 10 11.4 M/L (STAPLE)
APR CLP MED LRG 11.4X10 (STAPLE)
APR CLP MED LRG 5 ANG JAW (MISCELLANEOUS)
BLADE EXTENDED COATED 6.5IN (ELECTRODE) IMPLANT
CABLE HIGH FREQUENCY MONO STRZ (ELECTRODE) ×2 IMPLANT
CELLS DAT CNTRL 66122 CELL SVR (MISCELLANEOUS) IMPLANT
CHLORAPREP W/TINT 26 (MISCELLANEOUS) ×2 IMPLANT
CLIP APPLIE 5 13 M/L LIGAMAX5 (MISCELLANEOUS) IMPLANT
CLIP APPLIE ROT 10 11.4 M/L (STAPLE) IMPLANT
COUNTER NEEDLE 20 DBL MAG RED (NEEDLE) ×2 IMPLANT
COVER MAYO STAND STRL (DRAPES) ×6 IMPLANT
COVER SURGICAL LIGHT HANDLE (MISCELLANEOUS) ×2 IMPLANT
COVER WAND RF STERILE (DRAPES) IMPLANT
DECANTER SPIKE VIAL GLASS SM (MISCELLANEOUS) ×2 IMPLANT
DERMABOND ADVANCED (GAUZE/BANDAGES/DRESSINGS) ×1
DERMABOND ADVANCED .7 DNX12 (GAUZE/BANDAGES/DRESSINGS) IMPLANT
DRAIN CHANNEL 19F RND (DRAIN) IMPLANT
DRAPE LAPAROSCOPIC ABDOMINAL (DRAPES) ×2 IMPLANT
DRSG OPSITE POSTOP 4X10 (GAUZE/BANDAGES/DRESSINGS) IMPLANT
DRSG OPSITE POSTOP 4X6 (GAUZE/BANDAGES/DRESSINGS) ×1 IMPLANT
DRSG OPSITE POSTOP 4X8 (GAUZE/BANDAGES/DRESSINGS) IMPLANT
ELECT REM PT RETURN 15FT ADLT (MISCELLANEOUS) ×2 IMPLANT
ENDOLOOP SUT PDS II  0 18 (SUTURE)
ENDOLOOP SUT PDS II 0 18 (SUTURE) IMPLANT
EVACUATOR SILICONE 100CC (DRAIN) IMPLANT
GAUZE SPONGE 4X4 12PLY STRL (GAUZE/BANDAGES/DRESSINGS) ×2 IMPLANT
GLOVE SURG ENC MOIS LTX SZ6 (GLOVE) ×4 IMPLANT
GLOVE SURG UNDER LTX SZ6.5 (GLOVE) ×4 IMPLANT
GOWN STRL REUS W/ TWL XL LVL3 (GOWN DISPOSABLE) ×2 IMPLANT
GOWN STRL REUS W/TWL XL LVL3 (GOWN DISPOSABLE) ×8 IMPLANT
KIT TURNOVER KIT A (KITS) ×2 IMPLANT
LEGGING LITHOTOMY PAIR STRL (DRAPES) IMPLANT
PACK COLON (CUSTOM PROCEDURE TRAY) ×2 IMPLANT
PAD POSITIONING PINK XL (MISCELLANEOUS) IMPLANT
PENCIL SMOKE EVACUATOR (MISCELLANEOUS) IMPLANT
PORT LAP GEL ALEXIS MED 5-9CM (MISCELLANEOUS) IMPLANT
RELOAD PROXIMATE 75MM BLUE (ENDOMECHANICALS) ×4 IMPLANT
RELOAD STAPLE 75 3.8 BLU REG (ENDOMECHANICALS) IMPLANT
RETRACTOR WND ALEXIS 18 MED (MISCELLANEOUS) IMPLANT
RTRCTR WOUND ALEXIS 18CM MED (MISCELLANEOUS)
SCISSORS LAP 5X35 DISP (ENDOMECHANICALS) ×2 IMPLANT
SEALER TISSUE G2 STRG ARTC 35C (ENDOMECHANICALS) ×2 IMPLANT
SET IRRIG TUBING LAPAROSCOPIC (IRRIGATION / IRRIGATOR) ×2 IMPLANT
SET TUBE SMOKE EVAC HIGH FLOW (TUBING) ×2 IMPLANT
SLEEVE SURGEON STRL (DRAPES) IMPLANT
SLEEVE XCEL OPT CAN 5 100 (ENDOMECHANICALS) ×4 IMPLANT
SPONGE LAP 18X18 RF (DISPOSABLE) IMPLANT
STAPLER GUN LINEAR PROX 60 (STAPLE) ×1 IMPLANT
STAPLER PROXIMATE 75MM BLUE (STAPLE) ×1 IMPLANT
STAPLER VISISTAT 35W (STAPLE) ×2 IMPLANT
SURGILUBE 2OZ TUBE FLIPTOP (MISCELLANEOUS) IMPLANT
SUT ETHILON 2 0 PS N (SUTURE) IMPLANT
SUT MNCRL AB 4-0 PS2 18 (SUTURE) ×2 IMPLANT
SUT PDS AB 1 CTX 36 (SUTURE) ×4 IMPLANT
SUT PDS AB 1 TP1 96 (SUTURE) IMPLANT
SUT PROLENE 2 0 KS (SUTURE) ×2 IMPLANT
SUT SILK 2 0 (SUTURE)
SUT SILK 2 0 SH CR/8 (SUTURE) IMPLANT
SUT SILK 2-0 18XBRD TIE 12 (SUTURE) IMPLANT
SUT SILK 3 0 (SUTURE)
SUT SILK 3 0 SH CR/8 (SUTURE) IMPLANT
SUT SILK 3-0 18XBRD TIE 12 (SUTURE) IMPLANT
SUT VIC AB 2-0 SH 18 (SUTURE) ×2 IMPLANT
SUT VIC AB 3-0 SH 18 (SUTURE) ×3 IMPLANT
SUT VICRYL 2 0 18  UND BR (SUTURE) ×2
SUT VICRYL 2 0 18 UND BR (SUTURE) ×1 IMPLANT
SUT VICRYL 3 0 BR 18  UND (SUTURE) ×2
SUT VICRYL 3 0 BR 18 UND (SUTURE) ×1 IMPLANT
SYS LAPSCP GELPORT 120MM (MISCELLANEOUS)
SYSTEM LAPSCP GELPORT 120MM (MISCELLANEOUS) IMPLANT
TOWEL OR 17X26 10 PK STRL BLUE (TOWEL DISPOSABLE) IMPLANT
TOWEL OR NON WOVEN STRL DISP B (DISPOSABLE) ×2 IMPLANT
TRAY FOLEY MTR SLVR 16FR STAT (SET/KITS/TRAYS/PACK) ×1 IMPLANT
TROCAR BLADELESS OPT 5 100 (ENDOMECHANICALS) ×2 IMPLANT
TROCAR XCEL BLUNT TIP 100MML (ENDOMECHANICALS) IMPLANT
TROCAR XCEL NON-BLD 11X100MML (ENDOMECHANICALS) IMPLANT
TUBING CONNECTING 10 (TUBING) IMPLANT

## 2021-02-02 NOTE — Anesthesia Preprocedure Evaluation (Addendum)
Anesthesia Evaluation  Patient identified by MRN, date of birth, ID band Patient awake    Reviewed: Allergy & Precautions, NPO status , Patient's Chart, lab work & pertinent test results  Airway Mallampati: I  TM Distance: <3 FB Neck ROM: Full    Dental  (+) Edentulous Upper, Edentulous Lower   Pulmonary asthma , COPD,  oxygen dependent, former smoker,     + decreased breath sounds      Cardiovascular hypertension, Pt. on medications  Rhythm:Regular Rate:Normal     Neuro/Psych negative neurological ROS  negative psych ROS   GI/Hepatic Neg liver ROS, GERD  Medicated,  Endo/Other  Hypothyroidism   Renal/GU negative Renal ROS     Musculoskeletal  (+) Arthritis ,   Abdominal Normal abdominal exam  (+)   Peds  Hematology negative hematology ROS (+)   Anesthesia Other Findings - NGT in place  Reproductive/Obstetrics                            Anesthesia Physical Anesthesia Plan  ASA: III  Anesthesia Plan: General   Post-op Pain Management:    Induction: Intravenous  PONV Risk Score and Plan: 4 or greater and Ondansetron, Treatment may vary due to age or medical condition and Dexamethasone  Airway Management Planned: Oral ETT  Additional Equipment: None  Intra-op Plan:   Post-operative Plan: Extubation in OR  Informed Consent: I have reviewed the patients History and Physical, chart, labs and discussed the procedure including the risks, benefits and alternatives for the proposed anesthesia with the patient or authorized representative who has indicated his/her understanding and acceptance.     Dental advisory given  Plan Discussed with: CRNA  Anesthesia Plan Comments: (Lab Results      Component                Value               Date                      WBC                      7.3                 02/01/2021                HGB                      13.2                 02/01/2021                HCT                      41.2                02/01/2021                MCV                      87.8                02/01/2021                PLT  253                 02/01/2021           )        Anesthesia Quick Evaluation

## 2021-02-02 NOTE — Op Note (Signed)
    Laparoscopic Partial Colectomy (R hemicolectomy) Procedure Note   Indications: This patient presents for a laparoscopic right colectomy for obstructing ascending colon cancer  Pre-operative Diagnosis: ascending colon cancer   Post-operative Diagnosis: Same  Surgeon: Stark Klein   Assistants: Judyann Munson, RNFA  Anesthesia: General endotracheal anesthesia   ASA Class: 3   Procedure Details  The patient was seen in the Holding Room. The risks, benefits, complications, treatment options, and expected outcomes were discussed with the patient. The possibilities of reaction to medication, perforation of viscus, bleeding, recurrent infection, finding a normal colon, the need for additional procedures, failure to diagnose a condition, and creating a complication requiring transfusion or operation were discussed with the patient. The patient was advised of the risk of ostomy. The patient concurred with the proposed plan, giving informed consent. The patient was taken to the operating room, identified, and the procedure verified as partial colectomy. A Time Out was held and the above information confirmed.   The patient was brought to the operating room and placed supine. After induction of a general anesthetic, a Foley catheter was inserted and the abdomen was prepped and draped in standard fashion. Local anesthetic was infiltrated at the left costal margin. A 5 mm Optiview trocar was placed into the abdomen under direct visualization. Pneumoperitoneum was insufflated to a pressure of 15 mm Hg. The laparoscope was introduced.   There were omental adhesions to the midline and the subcostal incisions as expected. These were taken down sharply and with the enseal.  Exploration revealed a normal omentum, small bowel, peritoneum, liver, and stomach. Two additional left lateral 5-mm trocars were then placed after anesthetizing the skin and peritoneum with Marcaine. The ascending colon and hepatic  flexure were then mobilized with gentle retraction of the colon in a medial direction with mobilization of the peritoneal reflection with cautery and the Enseal. Mobilization of this area was complete to expose the retroperitoneum. The duodenum was avoided. The omentum was taken off the middle and proximal transverse colon with the Enseal.   After completing mobilization, a 6 cm incision was made in the midline just above the umbilicus.  An alexis wound protector was placed to protect the skin, and the colon was delivered through the incision. The terminal ileum and colon was resected with a linear stapling device proximal and distal to the area in question in regard to the specimen. The mesenteric vessels were divided with the enseal except the ileocolic.  This was clamped, tied, and suture ligated. The specimen was submitted to pathology.   An end-to-end anastomosis was performed through the small anterior incision with the linear stapling device. The mucosa was inspected and found to be hemostatic. Closure was achieved with the transverse stapling device. A 3-0 Vicryl suture was used to reapproximate the angle of the anastomosis. Hemostasis was confirmed.  The bowel anastomosis was returned to the abdomen.   The fascial incision was then closed with a running looped 0-PDS suture. The soft tissue was irrigated and the incisions were closed with 4-0 Vicryl subcuticular closure. Soft dressings were applied  Instrument, sponge, and needle counts were correct prior to abdominal closure and at the conclusion of the case.   Findings: apple core lesion in ascending colon (see below)  Estimated Blood Loss: 100 mL  Specimens: Right colon   Complications: None; patient tolerated the procedure well.   Disposition: PACU - hemodynamically stable.   Condition: stable

## 2021-02-02 NOTE — Progress Notes (Addendum)
PROGRESS NOTE    [name]  CWU:[id number] DOB: [birth year] DOA: 01/26/2021 PCP: Velna Hatchet, MD    Chief Complaint  Patient presents with  . Emesis  . Abdominal Pain    Brief Narrative:  Angelica [name] a 80 y.o.femalewith medical history significant forremote hx of thyroid cancer, celiac disease, rectal bleed, COPD, HTN, iron deficiency anemia who presents withabdominal pain, bloatingand persistentnausea and vomiting. Patient recently had CT of the abdomen done4/14showing abnormal thickening and luminal narrowing of the cecum and ascending colon with differential including carcinoid, IBD or focal colitis. She had plan colonoscopy with GI in June however presented to ED, given worsening symptoms. In ED: CT showing high-grade proximal large bowel obstruction involving the mid ascending colon. Patient was initially hypotensive with tachycardic tachypneic. Stated her symptoms have largely resolved. Surgery Dr. Dema Severin has evaluated patient at bedside and recommended NG tube placement and GI consult.   Assessment & Plan:   Principal Problem:   Large bowel obstruction (HCC) Active Problems:   Hypokalemia   COPD (chronic obstructive pulmonary disease)- on nocturnal oxygen at home   Acquired hypothyroidism   Hypotension   Hyperglycemia   Adenocarcinoma of colon ([place])  #1 large bowel obstruction secondary to moderately differentiated colorectal adenocarcinoma, POA -Patient had presented with ongoing abdominal pain, bloating, persistent nausea and vomiting.  Patient noted to have had a recent CT scan done on 414 that showed abnormal thickening and luminal narrowing of the cecum and ascending colon with differential including carcinoid, IBD or focal colitis. -CT abdomen and pelvis done did not show a high-grade proximal large bowel obstruction involving the mid ascending colon. -General surgery consulted who recommended NG tube placement, bowel rest, IV fluids, evaluation for  GI for potential endoscopic evaluation. -Patient was seen in consultation by GI and patient underwent colonoscopy with colonic biopsies revealing moderately differentiated colorectal adenocarcinoma. -Patient being followed by general surgery and patient for laparoscopic assisted partial colectomy pending to be done today. -CT chest which was done with multiple small bilateral pulmonary nodules measuring 6 mm and smaller, nonspecific although suspicious for pulmonary metastatic disease. -CEA at 5.8 -Oncology informed of admission via epic and oncology consulted with consultation pending. -General surgery following and appreciate input and recommendations.  2.  Hypokalemia -Repleted.  Potassium of 4.2.  Follow.  3.  Hyperglycemia -Hemoglobin A1c at 6. -Patient maintained on sliding scale insulin.  4.  Hypotension -Felt secondary to dehydration, poor oral intake. -BP improved with IV fluids. -Supportive care.  5.  Hypothyroidism with remote history of thyroid cancer -Continue IV Synthroid. -Outpatient follow-up.  6.  COPD -Stable.  As needed oxygen at night at baseline.  7.  SIRS criteria without sepsis; likely reactive secondary to above, POA/lactic acidosis due to profound dehydration -Patient presented with tachycardia, tachypnea, noted to have a leukocytosis but no clear source. -Resolved with supportive care, pain management. -Continue hydration with IV fluids -Supportive care.   DVT prophylaxis: Lovenox. Code Status: Full Family Communication: Updated patient and sister, Jesus Genera, at bedside Disposition:   Status is: Inpatient    Dispo: The patient is from: Home              Anticipated d/c is to: TBD              Patient currently with large bowel obstruction secondary to malignant tumor, undergoing surgery today per general surgery.  Not stable for discharge.   Difficult to place patient no       Consultants:  General surgery: Dr. Dema Severin  01/27/2021  Gastroenterology: Dr. Therisa Doyne 01/28/2021  Procedures:   CT chest 01/30/2021  CT abdomen and pelvis 01/26/2021  Abdominal films 01/27/2021, 01/26/2021  Chest x-ray 01/26/2021  NG tube/feeding placement under fluoroscopy per IR 01/27/2021 Dr. Carlis Abbott  2D echo 01/31/2021  Colonoscopy with biopsy per Dr. Therisa Doyne, GI 01/29/2021  Antimicrobials:   None   Subjective: Patient laying in bed.  Denies any chest pain.  No shortness of breath.  Complain of some sore throat.  Denies any nausea or vomiting.  States abdominal pain improved after NG tube placement.  Positive flatus this morning.  Stated had a bowel movement yesterday.  States she is awaiting results from biopsy from colonoscopy.  Awaiting on surgery today.  Has multiple questions in terms of how bad cancer is.  Objective: Vitals:   02/01/21 1413 02/01/21 2216 02/02/21 0621 02/02/21 1215  BP: 128/86 (!) 142/87 125/67 (!) 159/90  Pulse: 81 83 79 87  Resp: 20 19 19 16   Temp: 98.1 F (36.7 C) 98.4 F (36.9 C) 97.9 F (36.6 C)   TempSrc: Oral Oral Oral   SpO2: 94% 98% 95% 97%  Weight:      Height:        Intake/Output Summary (Last 24 hours) at 02/02/2021 1550 Last data filed at 02/02/2021 1543 Gross per 24 hour  Intake 2104.79 ml  Output 500 ml  Net 1604.79 ml   Filed Weights   01/26/21 1838 02/01/21 1000  Weight: 81.6 kg 84.2 kg    Examination:  General exam: Appears calm and comfortable.  NG tube in place with minimal bilious output noted. Respiratory system: Clear to auscultation.  No wheezes, no crackles, no rhonchi.  Respiratory effort normal. Cardiovascular system: S1 & S2 heard, RRR. No JVD, murmurs, rubs, gallops or clicks. No pedal edema. Gastrointestinal system: Abdomen is nondistended, soft and nontender, positive bowel sounds. No organomegaly or masses felt.  Central nervous system: Alert and oriented. No focal neurological deficits. Extremities: Symmetric 5 x 5 power. Skin: No rashes, lesions or  ulcers Psychiatry: Judgement and insight appear normal. Mood & affect appropriate.     Data Reviewed: I have personally reviewed following labs and imaging studies  CBC: Recent Labs  Lab 01/26/21 1830 01/27/21 0433 01/28/21 0405 01/29/21 0450 01/30/21 0450 01/31/21 0439 02/01/21 0340  WBC 14.7*   < > 7.7 8.6 8.9 8.5 7.3  NEUTROABS 12.5*  --   --   --   --   --  4.5  HGB 16.3*   < > 13.0 12.8 12.4 13.2 13.2  HCT 50.2*   < > 41.6 40.7 38.2 40.9 41.2  MCV 86.1   < > 91.0 90.0 87.0 86.7 87.8  PLT 353   < > 243 246 227 220 253   < > = values in this interval not displayed.    Basic Metabolic Panel: Recent Labs  Lab 01/29/21 0450 01/30/21 0450 01/31/21 0439 02/01/21 0340 02/02/21 0320  NA 136 138 138 139 137  K 3.1* 3.1* 3.6 3.5 4.2  CL 102 102 102 102 102  CO2 25 27 29 31 27   GLUCOSE 91 96 106* 114* 111*  BUN 13 7* 5* 8 13  CREATININE 0.68 0.75 0.63 0.62 0.44  CALCIUM 8.3* 8.5* 8.7* 8.7* 8.9  MG  --   --  1.5* 2.1 1.8  PHOS  --   --  2.9 3.7 4.1    GFR: Estimated Creatinine Clearance: 56.7 mL/min (by C-G formula based on  SCr of 0.44 mg/dL).  Liver Function Tests: Recent Labs  Lab 01/28/21 0405 01/29/21 0450 01/30/21 0450 01/31/21 0439 02/01/21 0340  AST 16 15 14* 17 15  ALT 15 14 11 14 12   ALKPHOS 48 45 46 47 45  BILITOT 1.2 1.3* 1.1 1.0 0.7  PROT 6.0* 6.0* 6.4* 6.4* 6.6  ALBUMIN 3.3* 3.2* 3.4* 3.5 3.2*    CBG: Recent Labs  Lab 02/01/21 0542 02/01/21 1147 02/01/21 1729 02/02/21 0009 02/02/21 0618  GLUCAP 117* 117* 132* 115* 129*     Recent Results (from the past 240 hour(s))  Resp Panel by RT-PCR (Flu A&B, Covid) Nasopharyngeal Swab     Status: None   Collection Time: 01/26/21 10:54 PM   Specimen: Nasopharyngeal Swab; Nasopharyngeal(NP) swabs in vial transport medium  Result Value Ref Range Status   SARS Coronavirus 2 by RT PCR NEGATIVE NEGATIVE Final    Comment: (NOTE) SARS-CoV-2 target nucleic acids are NOT DETECTED.  The SARS-CoV-2  RNA is generally detectable in upper respiratory specimens during the acute phase of infection. The lowest concentration of SARS-CoV-2 viral copies this assay can detect is 138 copies/mL. A negative result does not preclude SARS-Cov-2 infection and should not be used as the sole basis for treatment or other patient management decisions. A negative result may occur with  improper specimen collection/handling, submission of specimen other than nasopharyngeal swab, presence of viral mutation(s) within the areas targeted by this assay, and inadequate number of viral copies(<138 copies/mL). A negative result must be combined with clinical observations, patient history, and epidemiological information. The expected result is Negative.  Fact Sheet for Patients:  EntrepreneurPulse.com.au  Fact Sheet for Healthcare Providers:  IncredibleEmployment.be  This test is no t yet approved or cleared by the [place] FDA and  has been authorized for detection and/or diagnosis of SARS-CoV-2 by FDA under an Emergency Use Authorization (EUA). This EUA will remain  in effect (meaning this test can be used) for the duration of the COVID-19 declaration under Section 564(b)(1) of the Act, 21 U.S.C.section 360bbb-3(b)(1), unless the authorization is terminated  or revoked sooner.       Influenza A by PCR NEGATIVE NEGATIVE Final   Influenza B by PCR NEGATIVE NEGATIVE Final    Comment: (NOTE) The Xpert Xpress SARS-CoV-2/FLU/RSV plus assay is intended as an aid in the diagnosis of influenza from Nasopharyngeal swab specimens and should not be used as a sole basis for treatment. Nasal washings and aspirates are unacceptable for Xpert Xpress SARS-CoV-2/FLU/RSV testing.  Fact Sheet for Patients: EntrepreneurPulse.com.au  Fact Sheet for Healthcare Providers: IncredibleEmployment.be  This test is not yet approved or cleared by the  [place] FDA and has been authorized for detection and/or diagnosis of SARS-CoV-2 by FDA under an Emergency Use Authorization (EUA). This EUA will remain in effect (meaning this test can be used) for the duration of the COVID-19 declaration under Section 564(b)(1) of the Act, 21 U.S.C. section 360bbb-3(b)(1), unless the authorization is terminated or revoked.  Performed at [facility], [place] [address], [place], [place] [address]          Radiology Studies: No results found.      Scheduled Meds: . sodium chloride   Intravenous Once  . [MAR Hold] Chlorhexidine Gluconate Cloth  6 each Topical Daily  . [MAR Hold] enoxaparin (LOVENOX) injection  40 mg Subcutaneous Q24H  . [MAR Hold] insulin aspart  0-9 Units Subcutaneous Q6H  . [MAR Hold] levothyroxine  62.5 mcg Intravenous Daily  . [  MAR Hold] pantoprazole (PROTONIX) IV  40 mg Intravenous Daily   Continuous Infusions: . dextrose 5 % and 0.45% NaCl 10 mL/hr at 02/01/21 1825  . TPN ADULT (ION) 70 mL/hr at 02/01/21 1740  . TPN ADULT (ION)       LOS: 7 days    Time spent: 40 minutes    Irine Seal, MD [facility]   To contact the attending provider between 7A-7P or the covering provider during after hours 7P-7A, please log into the web site [email or web address] and access using universal [place] password for that web site. If you do not have the password, please call the hospital operator.  02/02/2021, 3:50 PM

## 2021-02-02 NOTE — Transfer of Care (Signed)
Immediate Anesthesia Transfer of Care Note  Patient: Angelica [name]  Procedure(s) Performed: LAPAROSCOPIC ASSISTED PARTIAL COLECTOMY (N/A )  Patient Location: PACU  Anesthesia Type:General  Level of Consciousness: awake and alert   Airway & Oxygen Therapy: Patient Spontanous Breathing and Patient connected to face mask oxygen  Post-op Assessment: Report given to RN and Post -op Vital signs reviewed and stable  Post vital signs: Reviewed and stable  Last Vitals:  Vitals Value Taken Time  BP 150/76 02/02/21 1624  Temp    Pulse 92 02/02/21 1625  Resp 25 02/02/21 1625  SpO2 100 % 02/02/21 1625  Vitals shown include unvalidated device data.  Last Pain:  Vitals:   02/02/21 1228  TempSrc:   PainSc: 0-No pain      Patients Stated Pain Goal: 0 (01/08/92 0123)  Complications: No complications documented.

## 2021-02-02 NOTE — Anesthesia Postprocedure Evaluation (Signed)
Anesthesia Post Note  Patient: Angelica [name]  Procedure(s) Performed: LAPAROSCOPIC ASSISTED PARTIAL COLECTOMY (N/A )     Patient location during evaluation: PACU Anesthesia Type: General Level of consciousness: awake and alert Pain management: pain level controlled Vital Signs Assessment: post-procedure vital signs reviewed and stable Respiratory status: spontaneous breathing, nonlabored ventilation, respiratory function stable and patient connected to nasal cannula oxygen Cardiovascular status: blood pressure returned to baseline and stable Postop Assessment: no apparent nausea or vomiting Anesthetic complications: no   No complications documented.  Last Vitals:  Vitals:   02/02/21 1715 02/02/21 1740  BP: 134/72 136/76  Pulse: 90 93  Resp: 20 (!) 22  Temp: 37.2 C 37.2 C  SpO2: 93% 93%    Last Pain:  Vitals:   02/02/21 1740  TempSrc: Oral  PainSc:                  Effie Berkshire

## 2021-02-02 NOTE — Progress Notes (Signed)
PHARMACY - TOTAL PARENTERAL NUTRITION CONSULT NOTE   Indication: bowel obstruction  Patient Measurements: Height: 5' 3"  (160 cm) Weight: 84.2 kg (185 lb 10 oz) IBW/kg (Calculated) : 52.4 TPN AdjBW (KG): 59.7 Body mass index is 32.88 kg/m. Usual Weight:   Assessment: Patient is an 80 y.o F presented to the ED on 5/4 with c/o abdominal pain and n/v.  Abdominal CT on 5/4 showed "high-grade proximal large bowel obstruction."  Patient was placed on NPO and NGT placed on 5/5.  Colonoscopy on 5/7 showed obstructing tumor in the distal ascending colon. Biopsies came back with "moderately differentiated colorectal adenocarcinoma". Plan is to proceed with colectomy on 5/11 and to start TPN on 5/9.  Glucose / Insulin: on sSSI q6h (has not required any insulin) - cbgs (goal <150): 111-132 Electrolytes: Na 137; Phos trending up at 4.1, Mag 1.8; CorrCa wnl; CL and CO2 wnl Renal: scr <1 Hepatic: LFTs wnl - prealbumin 9.8 (5/9), 9.9 (5/10) - TG 110 Intake / Output; MIVF: D5 0.45NS at 10 ml/hr - I/O: +1683 ml - NG output: 150 ml GI Imaging: - 5/4 abd CT: High-grade proximal large bowel obstruction involving the mid ascending colon GI Surgeries / Procedures:  - 5/5:  NGT placed - 5/7 colonoscopy: Likely malignant completely obstructing tumor in the distal ascending colon. Biopsied Central access: PICC placed on 5/9 TPN start date: 5/9   Nutritional Goals (per RD recommendation on 5/10): Kcal:  1900-2100 kcal Protein:  90-100 grams Fluid:  >/= 2 L/day  Goal TPN rate is 100 mL/hr (provides 96 g of protein and 2078 kcals per day)  Current Nutrition:  - NPO - TPN   Plan:   Now:  - Magnesium sulfate 1gm x1  - Increase TPN rate to goal rate 100 mL/hr at 1800 - Electrolytes in TPN: Na 58mq/L, K 560m/L, Ca 76m53mL, Mg 76mE82m, and decrease Phos to 10mm36m. Cl:Ac 1:1 - Add standard MVI and trace elements to TPN - change to Sensitive q6h SSI and adjust as needed  - continue  MIVF to 10   mL/hr at 1800 and take KCL out of IVF - Monitor TPN labs on Mon/Thurs  Constantinos Krempasky P 02/02/2021,7:12 AM

## 2021-02-02 NOTE — Anesthesia Procedure Notes (Signed)
Procedure Name: Intubation Date/Time: 02/02/2021 1:27 PM Performed by: Sharlette Dense, CRNA Patient Re-evaluated:Patient Re-evaluated prior to induction Oxygen Delivery Method: Circle system utilized Preoxygenation: Pre-oxygenation with 100% oxygen Induction Type: IV induction, Rapid sequence and Cricoid Pressure applied Laryngoscope Size: Miller and 2 Grade View: Grade I Tube type: Oral Tube size: 7.5 mm Number of attempts: 1 Airway Equipment and Method: Stylet Placement Confirmation: ETT inserted through vocal cords under direct vision,  positive ETCO2 and breath sounds checked- equal and bilateral Secured at: 21 cm Tube secured with: Tape Dental Injury: Teeth and Oropharynx as per pre-operative assessment

## 2021-02-02 NOTE — Interval H&P Note (Signed)
History and Physical Interval Note:  02/02/2021 12:32 PM  Angelica [name]  has presented today for surgery, with the diagnosis of PARTIALLY OBSTRUCTING COLON MASS.  The various methods of treatment have been discussed with the patient and family. After consideration of risks, benefits and other options for treatment, the patient has consented to  Procedure(s): LAPAROSCOPIC ASSISTED PARTIAL COLECTOMY (N/A) as a surgical intervention.  The patient's history has been reviewed, patient examined, no change in status, stable for surgery.  I have reviewed the patient's chart and labs.  Questions were answered to the patient's satisfaction.     Stark Klein

## 2021-02-03 ENCOUNTER — Encounter (HOSPITAL_COMMUNITY): Payer: Self-pay | Admitting: General Surgery

## 2021-02-03 DIAGNOSIS — E039 Hypothyroidism, unspecified: Secondary | ICD-10-CM | POA: Diagnosis not present

## 2021-02-03 DIAGNOSIS — K56609 Unspecified intestinal obstruction, unspecified as to partial versus complete obstruction: Secondary | ICD-10-CM | POA: Diagnosis not present

## 2021-02-03 DIAGNOSIS — J410 Simple chronic bronchitis: Secondary | ICD-10-CM | POA: Diagnosis not present

## 2021-02-03 DIAGNOSIS — R739 Hyperglycemia, unspecified: Secondary | ICD-10-CM | POA: Diagnosis not present

## 2021-02-03 LAB — CBC WITH DIFFERENTIAL/PLATELET
Abs Immature Granulocytes: 0.07 10*3/uL (ref 0.00–0.07)
Basophils Absolute: 0 10*3/uL (ref 0.0–0.1)
Basophils Relative: 0 %
Eosinophils Absolute: 0 10*3/uL (ref 0.0–0.5)
Eosinophils Relative: 0 %
HCT: 37.2 % (ref 36.0–46.0)
Hemoglobin: 12 g/dL (ref 12.0–15.0)
Immature Granulocytes: 1 %
Lymphocytes Relative: 5 %
Lymphs Abs: 0.7 10*3/uL (ref 0.7–4.0)
MCH: 28.2 pg (ref 26.0–34.0)
MCHC: 32.3 g/dL (ref 30.0–36.0)
MCV: 87.3 fL (ref 80.0–100.0)
Monocytes Absolute: 1.1 10*3/uL — ABNORMAL HIGH (ref 0.1–1.0)
Monocytes Relative: 9 %
Neutro Abs: 10.8 10*3/uL — ABNORMAL HIGH (ref 1.7–7.7)
Neutrophils Relative %: 85 %
Platelets: 236 10*3/uL (ref 150–400)
RBC: 4.26 MIL/uL (ref 3.87–5.11)
RDW: 13.7 % (ref 11.5–15.5)
WBC: 12.6 10*3/uL — ABNORMAL HIGH (ref 4.0–10.5)
nRBC: 0 % (ref 0.0–0.2)

## 2021-02-03 LAB — COMPREHENSIVE METABOLIC PANEL
ALT: 15 U/L (ref 0–44)
AST: 17 U/L (ref 15–41)
Albumin: 3.1 g/dL — ABNORMAL LOW (ref 3.5–5.0)
Alkaline Phosphatase: 40 U/L (ref 38–126)
Anion gap: 4 — ABNORMAL LOW (ref 5–15)
BUN: 18 mg/dL (ref 8–23)
CO2: 30 mmol/L (ref 22–32)
Calcium: 8.9 mg/dL (ref 8.9–10.3)
Chloride: 101 mmol/L (ref 98–111)
Creatinine, Ser: 0.58 mg/dL (ref 0.44–1.00)
GFR, Estimated: >60 mL/min (ref >60)
Glucose, Bld: 178 mg/dL — ABNORMAL HIGH (ref 70–99)
Potassium: 5 mmol/L (ref 3.5–5.1)
Sodium: 135 mmol/L (ref 135–145)
Total Bilirubin: 0.4 mg/dL (ref 0.3–1.2)
Total Protein: 6 g/dL — ABNORMAL LOW (ref 6.5–8.1)

## 2021-02-03 LAB — GLUCOSE, CAPILLARY
Glucose-Capillary: 154 mg/dL — ABNORMAL HIGH (ref 70–99)
Glucose-Capillary: 165 mg/dL — ABNORMAL HIGH (ref 70–99)
Glucose-Capillary: 176 mg/dL — ABNORMAL HIGH (ref 70–99)
Glucose-Capillary: 177 mg/dL — ABNORMAL HIGH (ref 70–99)

## 2021-02-03 LAB — MAGNESIUM: Magnesium: 1.8 mg/dL (ref 1.7–2.4)

## 2021-02-03 LAB — PHOSPHORUS: Phosphorus: 2.5 mg/dL (ref 2.5–4.6)

## 2021-02-03 MED ORDER — SODIUM CHLORIDE 0.9 % IV BOLUS
250.0000 mL | Freq: Once | INTRAVENOUS | Status: AC
  Administered 2021-02-03: 250 mL via INTRAVENOUS

## 2021-02-03 MED ORDER — MORPHINE SULFATE (PF) 2 MG/ML IV SOLN
1.0000 mg | INTRAVENOUS | Status: DC | PRN
Start: 2021-02-03 — End: 2021-02-07
  Administered 2021-02-03: 2 mg via INTRAVENOUS
  Filled 2021-02-03: qty 1

## 2021-02-03 MED ORDER — MAGNESIUM SULFATE IN D5W 1-5 GM/100ML-% IV SOLN
1.0000 g | Freq: Once | INTRAVENOUS | Status: AC
  Administered 2021-02-03: 1 g via INTRAVENOUS
  Filled 2021-02-03: qty 100

## 2021-02-03 MED ORDER — ACETAMINOPHEN 10 MG/ML IV SOLN
1000.0000 mg | Freq: Four times a day (QID) | INTRAVENOUS | Status: AC
  Administered 2021-02-03 – 2021-02-04 (×4): 1000 mg via INTRAVENOUS
  Filled 2021-02-03 (×4): qty 100

## 2021-02-03 MED ORDER — TRAVASOL 10 % IV SOLN
INTRAVENOUS | Status: AC
  Filled 2021-02-03: qty 960

## 2021-02-03 MED ORDER — METHOCARBAMOL 1000 MG/10ML IJ SOLN
500.0000 mg | Freq: Three times a day (TID) | INTRAVENOUS | Status: DC
  Administered 2021-02-03 – 2021-02-04 (×3): 500 mg via INTRAVENOUS
  Filled 2021-02-03: qty 5
  Filled 2021-02-03 (×3): qty 500

## 2021-02-03 NOTE — Progress Notes (Signed)
PROGRESS NOTE    Angelica [name]  PJK:[id number] DOB: 1941-03-22 DOA: 01/26/2021 PCP: Velna Hatchet, MD    Chief Complaint  Patient presents with  . Emesis  . Abdominal Pain    Brief Narrative:  Angelica [name] a 80 y.o.femalewith medical history significant forremote hx of thyroid cancer, celiac disease, rectal bleed, COPD, HTN, iron deficiency anemia who presents withabdominal pain, bloatingand persistentnausea and vomiting. Patient recently had CT of the abdomen done4/14showing abnormal thickening and luminal narrowing of the cecum and ascending colon with differential including carcinoid, IBD or focal colitis. She had plan colonoscopy with GI in June however presented to ED, given worsening symptoms. In ED: CT showing high-grade proximal large bowel obstruction involving the mid ascending colon. Patient was initially hypotensive with tachycardic tachypneic. Stated her symptoms have largely resolved. Surgery Dr. Dema Severin has evaluated patient at bedside and recommended NG tube placement and GI consult.   Assessment & Plan:   Principal Problem:   Large bowel obstruction (HCC) Active Problems:   Hypokalemia   COPD (chronic obstructive pulmonary disease)- on nocturnal oxygen at home   Acquired hypothyroidism   Hypotension   Hyperglycemia   Adenocarcinoma of colon ([place])  #1 large bowel obstruction secondary to moderately differentiated colorectal adenocarcinoma, POA -Patient had presented with ongoing abdominal pain, bloating, persistent nausea and vomiting.  Patient noted to have had a recent CT scan done on 414 that showed abnormal thickening and luminal narrowing of the cecum and ascending colon with differential including carcinoid, IBD or focal colitis. -CT abdomen and pelvis done did not show a high-grade proximal large bowel obstruction involving the mid ascending colon. -General surgery consulted who recommended NG tube placement, bowel rest, IV fluids, evaluation for  GI for potential endoscopic evaluation. -Patient was seen in consultation by GI and patient underwent colonoscopy with colonic biopsies revealing moderately differentiated colorectal adenocarcinoma. -Patient being followed by general surgery and patient s/p laparoscopic assisted partial colectomy 02/02/2021. -CT chest which was done with multiple small bilateral pulmonary nodules measuring 6 mm and smaller, nonspecific although suspicious for pulmonary metastatic disease. -CEA at 5.8 -On TPN per pharmacy -Oncology informed of admission via epic and oncology consulted. -General surgery following and appreciate input and recommendations.  2.  Hypokalemia -Repleted per pharmacy.  Potassium of 5.0.  Follow.  3.  Hyperglycemia -Hemoglobin A1c at 6. -CBG 176 this morning.  -SSI.   4.  Hypotension -Felt secondary to dehydration, poor oral intake. -Improved with hydration.   -Supportive care.  5.  Hypothyroidism with remote history of thyroid cancer -IV Synthroid.   -Once bowel function returns and patient placed on a diet we will transition back to home regimen oral Synthroid.  -Outpatient follow-up.  6.  COPD -Stable.  As needed oxygen at night at baseline.  7.  SIRS criteria without sepsis; likely reactive secondary to above, POA/lactic acidosis due to profound dehydration -Patient presented with tachycardia, tachypnea, noted to have a leukocytosis but no clear source. -Resolved with supportive care, pain management. -Continue gentle hydration.   -Supportive care.    DVT prophylaxis: Lovenox. Code Status: Full Family Communication: Updated patient.  No family at bedside.  Disposition:   Status is: Inpatient    Dispo: The patient is from: Home              Anticipated d/c is to: TBD              Patient currently with large bowel obstruction secondary to malignant tumor, status post laparoscopic right  colectomy.  Awaiting bowel function to return.  Not stable for discharge.     Difficult to place patient no       Consultants:   General surgery: Dr. Dema Severin 01/27/2021  Gastroenterology: Dr. Therisa Doyne 01/28/2021  Procedures:   CT chest 01/30/2021  CT abdomen and pelvis 01/26/2021  Abdominal films 01/27/2021, 01/26/2021  Chest x-ray 01/26/2021  NG tube/feeding placement under fluoroscopy per IR 01/27/2021 Dr. Carlis Abbott  2D echo 01/31/2021  Colonoscopy with biopsy per Dr. Therisa Doyne, GI 01/29/2021  Laparoscopic right colectomy for obstructing ascending colon cancer per Dr. Barry Dienes 02/02/2021  Antimicrobials:   None   Subjective: Patient laying in bed.  Complaining of some abdominal pain around incision site.  No nausea or vomiting.  No chest pain.  No shortness of breath.  States she wants to rest today and does not want to talk about her cancer today.  No flatus.  No bowel movement.    Objective: Vitals:   02/02/21 2205 02/03/21 0203 02/03/21 0556 02/03/21 1228  BP: 132/83 (!) 141/83 129/73 107/69  Pulse: 92 94 84 75  Resp: 16 16 20 20   Temp: 98.9 F (37.2 C) 98.7 F (37.1 C) 98.4 F (36.9 C) (!) 97.4 F (36.3 C)  TempSrc: Oral Oral Oral Oral  SpO2: 95% 96% 98% 98%  Weight:      Height:        Intake/Output Summary (Last 24 hours) at 02/03/2021 1742 Last data filed at 02/03/2021 1600 Gross per 24 hour  Intake 473 ml  Output 2400 ml  Net -1927 ml   Filed Weights   01/26/21 1838 02/01/21 1000  Weight: 81.6 kg 84.2 kg    Examination:  General exam: NAD.  NG tube in place with bilious drainage noted. Respiratory system: Clear to auscultation bilaterally anterior lung fields.  No wheezes, no crackles, no rhonchi.  Normal respiratory effort.  Cardiovascular system: Regular rate rhythm no murmurs rubs or gallops.  No JVD.  No lower extremity edema. Gastrointestinal system: Abdomen is soft, nondistended, positive bowel sounds, some tenderness to palpation around incision site.  Honeycomb dressing intact.  No rebound.  No guarding.  Central nervous system: Alert  and oriented. No focal neurological deficits. Extremities: Symmetric 5 x 5 power. Skin: No rashes, lesions or ulcers Psychiatry: Judgement and insight appear normal. Mood & affect appropriate.     Data Reviewed: I have personally reviewed following labs and imaging studies  CBC: Recent Labs  Lab 01/29/21 0450 01/30/21 0450 01/31/21 0439 02/01/21 0340 02/03/21 0358  WBC 8.6 8.9 8.5 7.3 12.6*  NEUTROABS  --   --   --  4.5 10.8*  HGB 12.8 12.4 13.2 13.2 12.0  HCT 40.7 38.2 40.9 41.2 37.2  MCV 90.0 87.0 86.7 87.8 87.3  PLT 246 227 220 253 633    Basic Metabolic Panel: Recent Labs  Lab 01/30/21 0450 01/31/21 0439 02/01/21 0340 02/02/21 0320 02/03/21 0358  NA 138 138 139 137 135  K 3.1* 3.6 3.5 4.2 5.0  CL 102 102 102 102 101  CO2 27 29 31 27 30   GLUCOSE 96 106* 114* 111* 178*  BUN 7* 5* 8 13 18   CREATININE 0.75 0.63 0.62 0.44 0.58  CALCIUM 8.5* 8.7* 8.7* 8.9 8.9  MG  --  1.5* 2.1 1.8 1.8  PHOS  --  2.9 3.7 4.1 2.5    GFR: Estimated Creatinine Clearance: 56.7 mL/min (by C-G formula based on SCr of 0.58 mg/dL).  Liver Function Tests: Recent Labs  Lab 01/29/21  2094 01/30/21 0450 01/31/21 0439 02/01/21 0340 02/03/21 0358  AST 15 14* 17 15 17   ALT 14 11 14 12 15   ALKPHOS 45 46 47 45 40  BILITOT 1.3* 1.1 1.0 0.7 0.4  PROT 6.0* 6.4* 6.4* 6.6 6.0*  ALBUMIN 3.2* 3.4* 3.5 3.2* 3.1*    CBG: Recent Labs  Lab 02/02/21 0618 02/02/21 1755 02/02/21 2333 02/03/21 0552 02/03/21 1235  GLUCAP 129* 207* 220* 176* 177*     Recent Results (from the past 240 hour(s))  Resp Panel by RT-PCR (Flu A&B, Covid) Nasopharyngeal Swab     Status: None   Collection Time: 01/26/21 10:54 PM   Specimen: Nasopharyngeal Swab; Nasopharyngeal(NP) swabs in vial transport medium  Result Value Ref Range Status   SARS Coronavirus 2 by RT PCR NEGATIVE NEGATIVE Final    Comment: (NOTE) SARS-CoV-2 target nucleic acids are NOT DETECTED.  The SARS-CoV-2 RNA is generally detectable in  upper respiratory specimens during the acute phase of infection. The lowest concentration of SARS-CoV-2 viral copies this assay can detect is 138 copies/mL. A negative result does not preclude SARS-Cov-2 infection and should not be used as the sole basis for treatment or other patient management decisions. A negative result may occur with  improper specimen collection/handling, submission of specimen other than nasopharyngeal swab, presence of viral mutation(s) within the areas targeted by this assay, and inadequate number of viral copies(<138 copies/mL). A negative result must be combined with clinical observations, patient history, and epidemiological information. The expected result is Negative.  Fact Sheet for Patients:  EntrepreneurPulse.com.au  Fact Sheet for Healthcare Providers:  IncredibleEmployment.be  This test is no t yet approved or cleared by the [place] FDA and  has been authorized for detection and/or diagnosis of SARS-CoV-2 by FDA under an Emergency Use Authorization (EUA). This EUA will remain  in effect (meaning this test can be used) for the duration of the COVID-19 declaration under Section 564(b)(1) of the Act, 21 U.S.C.section 360bbb-3(b)(1), unless the authorization is terminated  or revoked sooner.       Influenza A by PCR NEGATIVE NEGATIVE Final   Influenza B by PCR NEGATIVE NEGATIVE Final    Comment: (NOTE) The Xpert Xpress SARS-CoV-2/FLU/RSV plus assay is intended as an aid in the diagnosis of influenza from Nasopharyngeal swab specimens and should not be used as a sole basis for treatment. Nasal washings and aspirates are unacceptable for Xpert Xpress SARS-CoV-2/FLU/RSV testing.  Fact Sheet for Patients: EntrepreneurPulse.com.au  Fact Sheet for Healthcare Providers: IncredibleEmployment.be  This test is not yet approved or cleared by the [place] FDA and has been  authorized for detection and/or diagnosis of SARS-CoV-2 by FDA under an Emergency Use Authorization (EUA). This EUA will remain in effect (meaning this test can be used) for the duration of the COVID-19 declaration under Section 564(b)(1) of the Act, 21 U.S.C. section 360bbb-3(b)(1), unless the authorization is terminated or revoked.  Performed at [facility], [place] [address], [place], [place] [address]          Radiology Studies: No results found.      Scheduled Meds: . sodium chloride   Intravenous Once  . Chlorhexidine Gluconate Cloth  6 each Topical Daily  . enoxaparin (LOVENOX) injection  40 mg Subcutaneous Q24H  . insulin aspart  0-9 Units Subcutaneous Q6H  . levothyroxine  62.5 mcg Intravenous Daily  . pantoprazole (PROTONIX) IV  40 mg Intravenous Daily   Continuous Infusions: . acetaminophen 1,000 mg (02/03/21 1700)  . dextrose  5 % and 0.45% NaCl 10 mL/hr at 02/02/21 1804  . methocarbamol (ROBAXIN) IV 500 mg (02/03/21 1247)  . TPN ADULT (ION) 100 mL/hr at 02/02/21 1804  . TPN ADULT (ION)       LOS: 8 days    Time spent: 35 minutes    Irine Seal, MD [facility]   To contact the attending provider between 7A-7P or the covering provider during after hours 7P-7A, please log into the web site [email or web address] and access using universal [place] password for that web site. If you do not have the password, please call the hospital operator.  02/03/2021, 5:42 PM

## 2021-02-03 NOTE — Progress Notes (Signed)
PHARMACY - TOTAL PARENTERAL NUTRITION CONSULT NOTE   Indication: bowel obstruction  Patient Measurements: Height: 5' 3"  (160 cm) Weight: 84.2 kg (185 lb 10 oz) IBW/kg (Calculated) : 52.4 TPN AdjBW (KG): 59.7 Body mass index is 32.88 kg/m. Usual Weight:   Assessment: Patient is an 79 y.o F presented to the ED on 5/4 with c/o abdominal pain and n/v.  Abdominal CT on 5/4 showed "high-grade proximal large bowel obstruction."  Patient was placed on NPO and NGT placed on 5/5.  Colonoscopy on 5/7 showed obstructing tumor in the distal ascending colon. Biopsies came back with "moderately differentiated colorectal adenocarcinoma". Plan is to proceed with colectomy on 5/11 and to start TPN on 5/9.  Glucose / Insulin: on sSSI q6h, required 6 units insulin aspart yesterday - cbgs (goal <150): 111-207 Electrolytes: Na 135; Potassium up 5.0, Phos down 2.5, Mag 1.8; CorrCa wnl; CL and CO2 wnl Renal: scr <1 Hepatic: LFTs wnl - prealbumin 9.8 (5/9), 9.9 (5/10) - TG 110 Intake / Output; MIVF: D5 0.45NS at 10 ml/hr - I/O: -200 ml - NG output: 0 ml GI Imaging: - 5/4 abd CT: High-grade proximal large bowel obstruction involving the mid ascending colon GI Surgeries / Procedures:  - 5/5:  NGT placed - 5/7 colonoscopy: Likely malignant completely obstructing tumor in the distal ascending colon. Biopsied -5/11: laparoscopic partial right colectomy Central access: PICC placed on 5/9 TPN start date: 5/9   Nutritional Goals (per RD recommendation on 5/10): Kcal:  1900-2100 kcal Protein:  90-100 grams Fluid:  >/= 2 L/day  Goal TPN rate is 100 mL/hr (provides 96 g of protein and 2078 kcals per day)  Current Nutrition:  - NPO - TPN   Plan:   Now:  - Magnesium sulfate 1gm x1  - Continue TPN rate at goal rate 100 mL/hr at 1800 - Electrolytes in TPN: Na 83mq/L, decrease K to 256m/L, Ca 97m41mL, increase Mg to 6 mEq/L, and increase Phos to 12 mmol/L. Cl:Ac 1:1 - Add standard MVI and trace elements  to TPN - continue Sensitive q6h SSI and adjust as needed  - continue  MIVF to 10  mL/hr - Monitor BMP, magnesium and phosphorus tomorrow and TPN labs on Mon/Thurs   Trinitey Roache P. MicLegrand ComoharmD, BCPOsawatomieease utilize Amion for appropriate phone number to reach the unit pharmacist ([facility] [employer]/08/2021 9:09 AM

## 2021-02-03 NOTE — Evaluation (Signed)
Occupational Therapy Evaluation Patient Details Name: Angelica [name] MRN: [id number] DOB: 24-Sep-1941 Today's Date: 02/03/2021    History of Present Illness Patient admitted for large bowel obstruction. Angelica [name] is an 80 year old female with a past medical history significant for remote history of thyroid cancer, celiac disease, COPD, hypertension, and iron deficiency anemia.  outpatient colonoscopy in June but due to worsening symptoms, she presented to the emergency room.  Repeat CT abdomen/pelvis on admission showed high-grade proximal large bowel obstruction involving the mid ascending colon concerning for underlying neoplasm.  She underwent colonoscopy on 01/29/2021 which showed a likely malignant completely obstructing tumor in the distal ascending colon which was biopsied. 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  She had a CT of the chest performed 01/30/2021 which showed multiple small bilateral pulmonary nodules measuring 6 mm and smaller which are nonspecific but suspicious for pulmonary metastatic disease, prominent mediastinal lymph nodes which were similar in appearance to prior CT from November 2009 which are likely benign and reactive.  The patient was taken to the OR on 02/02/2021 for laparoscopic right colectomy due to her obstructing ascending colon cancer.   Clinical Impression   Patient found supine in bed on 2 L [place] and with NG to suction. Patient demonstrates ability to transfer to side of bed with verbal cues for log roll technique, to stand with RW from low bed height with min assist and to ambulate at side of bed with min guard. Patient needing assistance for lower body dressing due to abdominal pain and predominant seated position for ADLs due to poor activity tolerance. Patient will benefit from skilled OT services while in hospital to improve deficits and learn compensatory strategies as needed in order to return to PLOF. Patient highly motivated to  work with therapy in order to return home at discharge. Therapist recommended patient to work on getting assistance for home at discharge and patient verbalized understanding. Patient should be able to progress to be able to dc home if has assistance at home arranged.    Follow Up Recommendations  Home health OT    Equipment Recommendations  None recommended by OT    Recommendations for Other Services       Precautions / Restrictions Precautions Precautions: Other (comment);Fall Precaution Comments: Abdominal incision, NG tube      Mobility Bed Mobility Overal bed mobility: Needs Assistance Bed Mobility: Supine to Sit;Sit to Supine     Supine to sit: Supervision;HOB elevated Sit to supine: Min guard   General bed mobility comments: verbal cues for log roll technique.    Transfers Overall transfer level: Needs assistance Equipment used: Rolling walker (2 wheeled) Transfers: Sit to/from Stand Sit to Stand: Min assist         General transfer comment: min assist to stand from low bed height. min guard to ambulate beside bed.    Balance Overall balance assessment: Mild deficits observed, not formally tested                                         ADL either performed or assessed with clinical judgement   ADL Overall ADL's : Needs assistance/impaired Eating/Feeding: NPO   Grooming: Set up;Sitting   Upper Body Bathing: Set up;Sitting   Lower Body Bathing: Minimal assistance;Sit to/from stand   Upper Body Dressing : Set up;Sitting   Lower Body Dressing: Minimal assistance;Sit to/from stand Lower  Body Dressing Details (indicate cue type and reason): unable to don socks and reach feet due to pain Toilet Transfer: Stand-pivot;BSC;RW   Toileting- Clothing Manipulation and Hygiene: Min guard;Sit to/from stand               Vision Patient Visual Report: No change from baseline       Perception     Praxis      Pertinent Vitals/Pain  Pain Assessment: Faces Faces Pain Scale: Hurts little more Pain Location: incision site Pain Descriptors / Indicators: Grimacing;Guarding Pain Intervention(s): Limited activity within patient's tolerance;Premedicated before session     Hand Dominance Right   Extremity/Trunk Assessment Upper Extremity Assessment Upper Extremity Assessment: Overall WFL for tasks assessed   Lower Extremity Assessment Lower Extremity Assessment: Defer to PT evaluation       Communication Communication Communication: No difficulties   Cognition Arousal/Alertness: Awake/alert Behavior During Therapy: WFL for tasks assessed/performed Overall Cognitive Status: Within Functional Limits for tasks assessed                                     General Comments       Exercises     Shoulder Instructions      Home Living Family/patient expects to be discharged to:: Unsure                                        Prior Functioning/Environment Level of Independence: Independent with assistive device(s)        Comments: Patient lives alone at home in a single story house with two steps to enter. She has intermittent assistance from friends, neighbors and family. Patient is independent at baseline with assistive devices - using canes and rolator pRN. Patient uses shower chair for bathing.        OT Problem List: Decreased activity tolerance;Pain;Decreased safety awareness;Decreased knowledge of use of DME or AE;Impaired balance (sitting and/or standing)      OT Treatment/Interventions: Self-care/ADL training;DME and/or AE instruction;Therapeutic activities;Balance training;Patient/family education    OT Goals(Current goals can be found in the care plan section) Acute Rehab OT Goals Patient Stated Goal: To go home at discharge OT Goal Formulation: With patient Time For Goal Achievement: 02/17/21 Potential to Achieve Goals: Good  OT Frequency: Min 2X/week   Barriers  to D/C:            Co-evaluation              AM-PAC OT "6 Clicks" Daily Activity     Outcome Measure Help from another person eating meals?: None Help from another person taking care of personal grooming?: A Little Help from another person toileting, which includes using toliet, bedpan, or urinal?: A Little Help from another person bathing (including washing, rinsing, drying)?: A Little Help from another person to put on and taking off regular upper body clothing?: A Little Help from another person to put on and taking off regular lower body clothing?: A Little 6 Click Score: 19   End of Session Equipment Utilized During Treatment: Rolling walker;Oxygen Nurse Communication: Mobility status (donned [place])  Activity Tolerance: Patient tolerated treatment well Patient left: in bed;with call bell/phone within reach;with bed alarm set;with family/visitor present  OT Visit Diagnosis: Pain                Time: 0071-2197  OT Time Calculation (min): 22 min Charges:  OT General Charges $OT Visit: 1 Visit OT Evaluation $OT Eval Moderate Complexity: 1 Mod  Willodene Stallings, OTR/L [place]  Office [phone] Pager: [place] 02/03/2021, 4:50 PM

## 2021-02-03 NOTE — Progress Notes (Signed)
Progress Note  1 Day Post-Op  Subjective: Patient reports abdominal discomfort this AM. She reports she did not sleep much overnight. She is not passing flatus yet. Has already gotten up to bedside and hopes to mobilize more today once pain is a little better.   Objective: Vital signs in last 24 hours: Temp:  [98.4 F (36.9 C)-99.1 F (37.3 C)] 98.4 F (36.9 C) (05/12 0556) Pulse Rate:  [84-96] 84 (05/12 0556) Resp:  [16-25] 20 (05/12 0556) BP: (125-159)/(70-90) 129/73 (05/12 0556) SpO2:  [93 %-100 %] 98 % (05/12 0556) Last BM Date: 02/02/21  Intake/Output from previous day: 05/11 0701 - 05/12 0700 In: 2550 [I.V.:2200; IV Piggyback:350] Out: 2750 [Urine:2600; Blood:150] Intake/Output this shift: No intake/output data recorded.  PE: General: pleasant, WD, elderly female who is laying in bed in NAD Heart: regular, rate, and rhythm.   Lungs: CTAB, no wheezes, rhonchi, or rales noted.  Respiratory effort nonlabored Abd: soft, appropriately ttp, mildly distended, incision with honeycomb dressing present, BS hypoactive, NGT with bilious drainage, mild ecchymosis around laparoscopic port sites    Lab Results:  Recent Labs    02/01/21 0340 02/03/21 0358  WBC 7.3 12.6*  HGB 13.2 12.0  HCT 41.2 37.2  PLT 253 236   BMET Recent Labs    02/02/21 0320 02/03/21 0358  NA 137 135  K 4.2 5.0  CL 102 101  CO2 27 30  GLUCOSE 111* 178*  BUN 13 18  CREATININE 0.44 0.58  CALCIUM 8.9 8.9   PT/INR No results for input(s): LABPROT, INR in the last 72 hours. CMP     Component Value Date/Time   NA 135 02/03/2021 0358   NA 141 11/10/2013 1449   K 5.0 02/03/2021 0358   K 3.7 11/10/2013 1449   CL 101 02/03/2021 0358   CO2 30 02/03/2021 0358   CO2 27 11/10/2013 1449   GLUCOSE 178 (H) 02/03/2021 0358   GLUCOSE 116 11/10/2013 1449   BUN 18 02/03/2021 0358   BUN 13.2 11/10/2013 1449   CREATININE 0.58 02/03/2021 0358   CREATININE 0.8 11/10/2013 1449   CALCIUM 8.9 02/03/2021  0358   CALCIUM 9.1 11/10/2013 1449   PROT 6.0 (L) 02/03/2021 0358   PROT 7.1 11/10/2013 1449   ALBUMIN 3.1 (L) 02/03/2021 0358   ALBUMIN 3.8 11/10/2013 1449   AST 17 02/03/2021 0358   AST 19 11/10/2013 1449   ALT 15 02/03/2021 0358   ALT 17 11/10/2013 1449   ALKPHOS 40 02/03/2021 0358   ALKPHOS 76 11/10/2013 1449   BILITOT 0.4 02/03/2021 0358   BILITOT 0.74 11/10/2013 1449   GFRNONAA >60 02/03/2021 0358   GFRAA >60 05/06/2020 1441   Lipase     Component Value Date/Time   LIPASE 35 01/26/2021 1830       Studies/Results: No results found.  Anti-infectives: Anti-infectives (From admission, onward)   Start     Dose/Rate Route Frequency Ordered Stop   02/02/21 1830  cefoTEtan (CEFOTAN) 2 g in sodium chloride 0.9 % 100 mL IVPB  Status:  Discontinued       Note to Pharmacy: Doesn't even recall if she is allergic to PCN   2 g 200 mL/hr over 30 Minutes Intravenous  Once 02/02/21 1742 02/02/21 1814   02/02/21 0600  ceFAZolin (ANCEF) 1 g in sodium chloride 0.9 % 100 mL IVPB  Status:  Discontinued       Note to Pharmacy: Patient states she doesn't even know if she is allergic to PCN  1 g 200 mL/hr over 30 Minutes Intravenous On call to O.R. 02/01/21 6468 02/01/21 1246   02/02/21 0600  cefoTEtan (CEFOTAN) 2 g in sodium chloride 0.9 % 100 mL IVPB       Note to Pharmacy: Doesn't even recall if she is allergic to PCN   2 g 200 mL/hr over 30 Minutes Intravenous On call to O.R. 02/01/21 1246 02/02/21 1345       Assessment/Plan H/O thyroid cancer HTN COPD, prn 2L O2 at home Iron deficiency anemia - likely secondary to below, hgb 12 Severe protein calorie malnutrition - prealbumin 9.9 5/10, continue TPN  Partially obstructing ascending colon mass S/P laparoscopic right hemicolectomy 02/02/21 Dr. Barry Dienes  - POD#1 - dc foley  - added IV tylenol and robaxin scheduled for pain control - PT/OT - mild ileus - keep NGT to LIWS, mobilize, continue TPN   FEN - NPO, TPN, NGT to  LIWS VTE - lovenox ID - cefotetan pre-op  LOS: 8 days    Norm Parcel, [facility] 02/03/2021, 8:54 AM Please see Amion for pager number during day hours 7:00am-4:30pm

## 2021-02-03 NOTE — Consult Note (Addendum)
[place]  [phone]) [phone] Fax:(336) [phone]   MEDICAL ONCOLOGY - INITIAL CONSULTATION  Referral MD: Dr. Irine Seal  Reason for Referral: Colon adenocarcinoma  HPI: Angelica [name] is an 80 year old female with a past medical history significant for remote history of thyroid cancer, celiac disease, COPD, hypertension, and iron deficiency anemia.  She presented to the emergency room with abdominal pain, bloating, persistent nausea and vomiting.  The patient was seen as an outpatient and referred for a CT of the abdomen/pelvis which was performed on 01/06/2021 which showed abnormal thickening and luminal narrowing involving the cecum and ascending colon up to roughly the level of the mid ascending colon, stable large hiatal hernia.  Plan was for an outpatient colonoscopy in June but due to worsening symptoms, she presented to the emergency room.  Repeat CT abdomen/pelvis on admission showed high-grade proximal large bowel obstruction involving the mid ascending colon concerning for underlying neoplasm.  She underwent colonoscopy on 01/29/2021 which showed a likely malignant completely obstructing tumor in the distal ascending colon which was biopsied. 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  A CEA was obtained on 01/29/2021 and was mildly elevated at 5.8. She had a CT of the chest performed 01/30/2021 which showed multiple small bilateral pulmonary nodules measuring 6 mm and smaller which are nonspecific but suspicious for pulmonary metastatic disease, prominent mediastinal lymph nodes which were similar in appearance to prior CT from November 2009 which are likely benign and reactive.  The patient was taken to the OR on 02/02/2021 for laparoscopic right colectomy due to her obstructing ascending colon cancer.  Surgical pathology is currently pending.  The patient was seen in her hospital room today.  Her friend, Angelica [name], was at the bedside.  The patient  reports that she has had a decreased appetite due to abdominal pain and nausea and vomiting after she eats.  She estimates that she has lost about 50 pounds over the past 6 months.  Her to admission, she was also experiencing constipation.  When her bowels did move she reported that her stools were liquid but brown in color.  She had not noticed any melena or hematochezia.  She reports abdominal tenderness since surgery.  NG tube in place and she is not having any nausea or vomiting at this time. Denies flatus.  No bleeding has been reported.  The patient is widowed.  She had 2 sons who are both deceased.  She reports that she has multiple neighbors and friends who check on her.  She is currently independent with ADLs.  She ambulates with a walker.  Denies history of alcohol use.  Previously smoked 3/4 pack of cigarettes x60 years.  Quit 2009.  Medical Oncology was asked to see the patient to make recommendations regarding her colon adenocarcinoma.    Past Medical History:  Diagnosis Date  . Anemia   . Arthritis   . Asthma   . Celiac disease 11/17/2013  . COPD (chronic obstructive pulmonary disease) (HCC)    on 2L prn home O2  . Hypertension   . Rectal bleed 10/23/2018  . Thyroid cancer ([place])    remote  :  Past Surgical History:  Procedure Laterality Date  . ABDOMINAL HYSTERECTOMY    . BIOPSY  01/29/2021   Procedure: BIOPSY;  Surgeon: Ronnette Juniper, MD;  Location: [facility] ENDOSCOPY;  Service: Gastroenterology;;  . BREAST EXCISIONAL BIOPSY Bilateral   . CHOLECYSTECTOMY    . COLONOSCOPY  07/05/2012   Procedure: COLONOSCOPY;  Surgeon:  Winfield Cunas., MD;  Location: [facility] ENDOSCOPY;  Service: Endoscopy;  Laterality: N/A;  . COLONOSCOPY N/A 03/12/2015   Procedure: COLONOSCOPY;  Surgeon: Laurence Spates, MD;  Location: [facility] ENDOSCOPY;  Service: Endoscopy;  Laterality: N/A;  . COLONOSCOPY N/A 01/29/2021   Procedure: COLONOSCOPY;  Surgeon: Ronnette Juniper, MD;  Location: [facility] ENDOSCOPY;  Service: Gastroenterology;   Laterality: N/A;  . ESOPHAGOGASTRODUODENOSCOPY  07/05/2012   Procedure: ESOPHAGOGASTRODUODENOSCOPY (EGD);  Surgeon: Winfield Cunas., MD;  Location: [facility] ENDOSCOPY;  Service: Endoscopy;  Laterality: N/A;  . HOT HEMOSTASIS N/A 03/12/2015   Procedure: HOT HEMOSTASIS (ARGON PLASMA COAGULATION/BICAP);  Surgeon: Laurence Spates, MD;  Location: [facility] ENDOSCOPY;  Service: Endoscopy;  Laterality: N/A;  . LAPAROSCOPIC PARTIAL COLECTOMY N/A 02/02/2021   Procedure: LAPAROSCOPIC ASSISTED PARTIAL COLECTOMY;  Surgeon: Stark Klein, MD;  Location: [facility] ORS;  Service: General;  Laterality: N/A;  . SUBMUCOSAL TATTOO INJECTION  01/29/2021   Procedure: SUBMUCOSAL TATTOO INJECTION;  Surgeon: Ronnette Juniper, MD;  Location: [facility] ENDOSCOPY;  Service: Gastroenterology;;  . TONSILLECTOMY    :  Current Facility-Administered Medications  Medication Dose Route Frequency Provider Last Rate Last Admin  . 0.9 %  sodium chloride infusion (Manually program via Guardrails IV Fluids)   Intravenous Once Stark Klein, MD      . acetaminophen (OFIRMEV) IV 1,000 mg  1,000 mg Intravenous Q6H Norm Parcel, PA-C      . Benzocaine (HURRCAINE) 20 % mouth spray   Mouth/Throat QID PRN Stark Klein, MD      . Chlorhexidine Gluconate Cloth 2 % PADS 6 each  6 each Topical Daily Stark Klein, MD   6 each at 02/03/21 0856  . dextrose 5 %-0.45 % sodium chloride infusion   Intravenous Continuous Stark Klein, MD 10 mL/hr at 02/02/21 1804 Infusion Verify at 02/02/21 1804  . enoxaparin (LOVENOX) injection 40 mg  40 mg Subcutaneous Q24H Stark Klein, MD   40 mg at 02/01/21 2100  . insulin aspart (novoLOG) injection 0-9 Units  0-9 Units Subcutaneous Q6H Stark Klein, MD   2 Units at 02/03/21 0710  . levothyroxine (SYNTHROID, LEVOTHROID) injection 62.5 mcg  62.5 mcg Intravenous Daily Stark Klein, MD   62.5 mcg at 02/03/21 0854  . lip balm (CARMEX) ointment   Topical PRN Stark Klein, MD   Given at 02/02/21 2136  . magnesium sulfate IVPB 1 g 100 mL  1  g Intravenous Once Efraim Kaufmann, RPH      . methocarbamol (ROBAXIN) 500 mg in dextrose 5 % 50 mL IVPB  500 mg Intravenous Q8H Johnson, Kelly R, PA-C      . morphine 2 MG/ML injection 1-2 mg  1-2 mg Intravenous Q2H PRN Norm Parcel, PA-C   2 mg at 02/03/21 0851  . ondansetron (ZOFRAN) injection 4-8 mg  4-8 mg Intravenous Q6H PRN Stark Klein, MD      . pantoprazole (PROTONIX) injection 40 mg  40 mg Intravenous Daily Stark Klein, MD   40 mg at 02/03/21 0854  . sodium chloride flush (NS) 0.9 % injection 10-40 mL  10-40 mL Intracatheter PRN Stark Klein, MD      . TPN ADULT (ION)   Intravenous Continuous TPN Stark Klein, MD 100 mL/hr at 02/02/21 1804 New Bag at 02/02/21 1804  . TPN ADULT (ION)   Intravenous Continuous TPN Efraim Kaufmann, [facility]         Allergies  Allergen Reactions  . Other Hives    Pt states she has several other allergies  but can't recall the names, pt recalled the medication to be codeine.  . Fosamax [Alendronate Sodium]   . Penicillins Other (See Comments)    Unknown Did it involve swelling of the face/tongue/throat, SOB, or low BP? Unknown Did it involve sudden or severe rash/hives, skin peeling, or any reaction on the inside of your mouth or nose? Unknown Did you need to seek medical attention at a hospital or doctor's office? Unknown When did it last happen? If all above answers are "NO", may proceed with cephalosporin use.  . Sulfa Antibiotics   . Codeine Rash    Blisters "   :  Family History  Problem Relation Age of Onset  . Heart disease Mother   . [name] failure Father   . Cancer Sister        thyroid cancer  :  Social History   Socioeconomic History  . Marital status: Widowed    Spouse name: Not on file  . Number of children: Not on file  . Years of education: Not on file  . Highest education level: Not on file  Occupational History  . Occupation: retired  Tobacco Use  . Smoking status: Former Smoker    Packs/day: 0.75     Years: 60.00    Pack years: 45.00    Types: Cigarettes    Quit date: 07/03/2008    Years since quitting: 12.5  . Smokeless tobacco: Never Used  Vaping Use  . Vaping Use: Never used  Substance and Sexual Activity  . Alcohol use: No  . Drug use: No  . Sexual activity: Not on file  Other Topics Concern  . Not on file  Social History Narrative  . Not on file   Social Determinants of Health   Financial Resource Strain: Not on file  Food Insecurity: Not on file  Transportation Needs: Not on file  Physical Activity: Not on file  Stress: Not on file  Social Connections: Not on file  Intimate Partner Violence: Not on file  :  Review of Systems: A comprehensive 14 point review of systems was negative except as noted in the HPI.  Exam: Patient Vitals for the past 24 hrs:  BP Temp Temp src Pulse Resp SpO2  02/03/21 0556 129/73 98.4 F (36.9 C) Oral 84 20 98 %  02/03/21 0203 (!) 141/83 98.7 F (37.1 C) Oral 94 16 96 %  02/02/21 2205 132/83 98.9 F (37.2 C) Oral 92 16 95 %  02/02/21 1859 134/75 98.6 F (37 C) Oral 92 20 96 %  02/02/21 1740 136/76 98.9 F (37.2 C) Oral 93 20 93 %  02/02/21 1715 134/72 99 F (37.2 C) -- 90 20 93 %  02/02/21 1700 133/75 -- -- 92 (!) 21 94 %  02/02/21 1645 125/70 -- -- 92 20 94 %  02/02/21 1630 (!) 154/82 -- -- 93 (!) 25 100 %  02/02/21 1624 (!) 150/76 99.1 F (37.3 C) -- 96 (!) 22 100 %  02/02/21 1215 (!) 159/90 -- -- 87 16 97 %    General: Awake and alert, no distress Eyes:  no scleral icterus.   ENT: NG tube in place, no thrush or mucositis   Lymphatics:  Negative cervical, supraclavicular or axillary adenopathy.   Respiratory: lungs were clear bilaterally without wheezing or crackles.   Cardiovascular:  Regular rate and rhythm, S1/S2, without murmur, rub or gallop.  There was no pedal edema.   GI: Very faint bowel sounds, soft, tenderness with palpation, honeycomb dressing without  drainage. Musculoskeletal: Strength symmetrical in the  upper and lower extremities. Skin exam was without echymosis, petichae.   Neuro exam was nonfocal. Patient was alert and oriented.  Attention was good.   Language was appropriate.  Mood was normal without depression.  Speech was not pressured.  Thought content was not tangential.     Lab Results  Component Value Date   WBC 12.6 (H) 02/03/2021   HGB 12.0 02/03/2021   HCT 37.2 02/03/2021   PLT 236 02/03/2021   GLUCOSE 178 (H) 02/03/2021   TRIG 110 02/01/2021   ALT 15 02/03/2021   AST 17 02/03/2021   NA 135 02/03/2021   K 5.0 02/03/2021   CL 101 02/03/2021   CREATININE 0.58 02/03/2021   BUN 18 02/03/2021   CO2 30 02/03/2021    CT Abdomen Pelvis Wo Contrast  Result Date: 01/26/2021 CLINICAL DATA:  Unspecified abdominal pain, vomiting EXAM: CT ABDOMEN AND PELVIS WITHOUT CONTRAST TECHNIQUE: Multidetector CT imaging of the abdomen and pelvis was performed following the standard protocol without IV contrast. COMPARISON:  01/06/2021 FINDINGS: Lower chest: Large hiatal hernia is present which appears fluid-filled. The visualized lung bases are clear. The visualized heart and pericardium are unremarkable. Hepatobiliary: Stable cyst within the left hepatic lobe. No definite solid intrahepatic mass identified on this noncontrast examination. Cholecystectomy has been performed. No intra or extrahepatic biliary ductal dilation. Pancreas: Unremarkable Spleen: Unremarkable Adrenals/Urinary Tract: The adrenal glands are unremarkable. The kidneys are normal in size and position. 6 mm nonobstructing calculus noted within the lower pole of the right [name]. The kidneys are otherwise unremarkable. The bladder is unremarkable. Stomach/Bowel: There is a high-grade proximal large bowel obstruction involving the mid ascending colon with the point transition best seen axial image # 39/2 and coronal image # 87/5. 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  An underlying intrinsic neoplasm, however, is not excluded on this examination. Proximally, the small bowel is dilated and fluid-filled. Distally, the large bowel is decompressed. There is moderate sigmoid diverticulosis. No free intraperitoneal gas or fluid. Vascular/Lymphatic: Moderate atherosclerotic calcification within the abdominal aorta. No aortic aneurysm. No pathologic adenopathy within the abdomen and pelvis. Reproductive: Status post hysterectomy. No adnexal masses. Other: Tiny fat containing umbilical hernia. The rectum is unremarkable. Musculoskeletal: No acute bone abnormality. No lytic or blastic bone lesion. IMPRESSION: High-grade proximal large bowel obstruction involving the mid ascending colon. 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 Mild right nonobstructing nephrolithiasis. Large hiatal hernia Aortic Atherosclerosis (ICD10-I70.0). Electronically Signed   By: Fidela Salisbury MD   On: 01/26/2021 21:50   DG Abdomen 1 View  Result Date: 01/27/2021 CLINICAL DATA:  NG tube placement. EXAM: ABDOMEN - 1 VIEW COMPARISON:  Jan 26, 2021 FINDINGS: A nasogastric tube is seen with its distal end looped upon itself, overlying the medial aspect of the right lung base and adjacent portion of the thoracic spine. This is likely within the patient's known large gastric hernia. The bowel gas pattern is normal. Radiopaque surgical clips are seen overlying the right upper quadrant. No radio-opaque calculi or other significant radiographic abnormality are seen. IMPRESSION: Nasogastric tube positioning, as described above Electronically Signed   By: Virgina Norfolk M.D.   On: 01/27/2021 02:26   CT CHEST W CONTRAST  Result Date: 01/30/2021 CLINICAL DATA:  Colorectal cancer staging, obstructing ascending colon  mass EXAM: CT CHEST WITH  CONTRAST TECHNIQUE: Multidetector CT imaging of the chest was performed during intravenous contrast administration. CONTRAST:  47m OMNIPAQUE IOHEXOL 300 MG/ML  SOLN COMPARISON:  CT abdomen pelvis, 01/26/2021, CT chest, 08/11/2008 FINDINGS: Cardiovascular: No significant vascular findings. Normal heart size. No pericardial effusion. Mediastinum/Nodes: Prominent mediastinal lymph nodes, largest left hilar/AP window nodes measuring 1.8 x 1.2 cm (series 2, image 51). Moderate hiatal hernia with intrathoracic position of the gastric fundus. Status post thyroidectomy. Trachea and esophagus demonstrate no significant findings. Lungs/Pleura: There are multiple small bilateral pulmonary nodules, including a 5 mm nodule of the dependent right lower lobe (series 5, image 99), a 6 mm nodule of the posterior left upper lobe (series 5, image 52), a 6 mm nodule of the central left lower lobe (series 5, image 91) and a 5 mm nodule of the dependent left lower lobe (series 5, image 93). No pleural effusion or pneumothorax. Upper Abdomen: No acute abnormality. Esophagogastric tube, tip positioned in the duodenal bulb. Musculoskeletal: No chest wall mass or suspicious bone lesions identified. IMPRESSION: 1. There are multiple small bilateral pulmonary nodules, measuring 6 mm and smaller, nonspecific although suspicious for pulmonary metastatic disease. These are new compared to very remote prior CT of the chest dated 08/11/2008 and likely below size threshold for confident PET-CT characterization. 2. Prominent mediastinal lymph nodes, similar in appearance to remote prior CT dated 08/11/2008 and likely benign and reactive. 3. Moderate hiatal hernia with intrathoracic position of the gastric fundus. 4. Esophagogastric tube, tip positioned in the duodenal bulb. Electronically Signed   By: AEddie CandleM.D.   On: 01/30/2021 20:44   CT ABDOMEN PELVIS W CONTRAST  Result Date: 01/07/2021 CLINICAL DATA:  Abdominal pain, nausea, diarrhea,  bloating cramping. 40 pound weight loss over 6 months. EXAM: CT ABDOMEN AND PELVIS WITH CONTRAST TECHNIQUE: Multidetector CT imaging of the abdomen and pelvis was performed using the standard protocol following bolus administration of intravenous contrast. CONTRAST:  1075mISOVUE-300 IOPAMIDOL (ISOVUE-300) INJECTION 61% COMPARISON:  CT of the abdomen and pelvis without contrast on 06/19/2009 FINDINGS: Lower chest: No acute findings.  Stable large hiatal hernia. Hepatobiliary: The liver is unremarkable and demonstrates no masses. There is a small simple cyst in the inferior right lobe measuring approximately 9 mm. The gallbladder has been removed. Mild dilatation of the common bile duct up to approximately 10 mm in diameter is likely attributable to prior cholecystectomy. Pancreas: Unremarkable. No pancreatic ductal dilatation or surrounding inflammatory changes. Spleen: Normal in size without focal abnormality. Adrenals/Urinary Tract: Adrenal glands are unremarkable. Kidneys are normal, without renal calculi, focal lesion, or hydronephrosis. Bladder is unremarkable. Stomach/Bowel: Bowel demonstrates abnormal thickening and luminal narrowing involving the cecum and ascending colon up to roughly the level of the mid ascending colon. There also appears to be contiguous thickening in the terminal ileum. Some surrounding infiltrative appearance of the pericolonic fat is noted as well as some potential small lymph nodes measuring up to 6 mm. Differential considerations include carcinoma, inflammatory bowel disease or focal colitis also involving the terminal ileum. No evidence of associated small bowel obstruction, abscess or bowel perforation. Diverticulosis of the sigmoid colon present without evidence of acute diverticulitis. Vascular/Lymphatic: Atherosclerosis of the distal abdominal aorta without evidence of aneurysm. No enlarged retroperitoneal or iliac lymph nodes. Reproductive: Status post hysterectomy. No adnexal  masses. Other: No abdominal wall hernia or abnormality. No abdominopelvic ascites. Musculoskeletal: No acute or significant osseous findings. IMPRESSION: 1. Abnormal thickening and luminal narrowing involving the cecum  and ascending colon up to roughly the level of the mid ascending colon. There also appears to be contiguous thickening in the terminal ileum. Differential considerations include carcinoma, inflammatory bowel disease or focal colitis also involving the terminal ileum. No evidence of associated small bowel obstruction, abscess or bowel perforation. Correlation with evidence of blood in the stool is recommended as well as direct visualization with colonoscopy. 2. Stable large hiatal hernia. 3. Aortic atherosclerosis without evidence of aneurysm. Aortic Atherosclerosis (ICD10-I70.0). Electronically Signed   By: Aletta Edouard M.D.   On: 01/07/2021 15:08   DG Chest Port 1 View  Result Date: 01/26/2021 CLINICAL DATA:  Onset abdominal pain and vomiting this morning. EXAM: PORTABLE CHEST 1 VIEW COMPARISON:  PA and lateral chest 04/08/2014. FINDINGS: Lungs clear. Heart size normal. Hiatal hernia again seen. No pneumothorax or pleural fluid. No acute or focal bony abnormality. IMPRESSION: No acute disease. Hiatal hernia. Electronically Signed   By: Inge Rise M.D.   On: 01/26/2021 20:00   DG Abd Portable 1 View  Result Date: 01/26/2021 CLINICAL DATA:  Onset abdominal pain and vomiting this morning. EXAM: PORTABLE ABDOMEN - 1 VIEW COMPARISON:  None. FINDINGS: Gas-filled loops of small bowel are dilated up to approximately 4.3 cm. There is some gas in the ascending colon. No unexpected abdominal calcification is seen. No acute bony abnormality. IMPRESSION: Dilated loops of small bowel with gas in the colon worrisome for early or partial small bowel obstruction. Electronically Signed   By: Inge Rise M.D.   On: 01/26/2021 20:02   DG INTRO LONG GI TUBE  Result Date: 01/27/2021 CLINICAL DATA:   Small-bowel obstruction. Feeding tube for decompression. EXAM: FL FEEDING TUBE PLACEMENT CONTRAST:  50 mL of Omnipaque 300 through the NG tube FLUOROSCOPY TIME:  Fluoroscopy Time:  3 minutes 12 second Radiation Exposure Index (if provided by the fluoroscopic device): Number of Acquired Spot Images: 2 COMPARISON:  CT abdomen pelvis 01/26/2021 FINDINGS: Review of the prior CT reveals a large hiatal hernia and small-bowel obstruction. NG tube passes through the right nares after lubrication and anesthesia with lidocaine lubricant. NG tube placed into the hiatal hernia. It was difficult to enter the stomach below the hernia. Addition of the Amplatz wire and injection of contrast eventually allowed the tube to pass into the stomach. Final tube position is in the antrum of the stomach. IMPRESSION: Large hiatal hernia. Feeding tube placed with the tip in the antrum of the stomach. Electronically Signed   By: Franchot Gallo M.D.   On: 01/27/2021 10:20   ECHOCARDIOGRAM COMPLETE  Result Date: 01/31/2021    ECHOCARDIOGRAM REPORT   Patient Name:   Angelica [name] Galgano Date of Exam: 01/31/2021 Medical Rec #:  [id number]     Height:       63.0 in Accession #:    [id number]    Weight:       180.0 lb Date of Birth:  September 03, 1941     BSA:          1.849 m Patient Age:    64 years      BP:           127/62 mmHg Patient Gender: F             HR:           81 bpm. Exam Location:  Inpatient Procedure: 2D Echo, Color Doppler and Cardiac Doppler Indications:    Pre op eval  History:        Patient  has no prior history of Echocardiogram examinations.                 COPD; Risk Factors:Hypertension.  Sonographer:    Kandice Robinsons RDCS Referring Phys: [id number] [place]  Sonographer Comments: Patient is morbidly obese. Image acquisition challenging due to patient body habitus and Image acquisition challenging due to respiratory motion. IMPRESSIONS  1. Left ventricular ejection fraction, by estimation, is 55 to 60%. The left ventricle has  normal function. The left ventricle has no regional wall motion abnormalities. Left ventricular diastolic parameters are indeterminate.  2. Right ventricular systolic function is low normal. The right ventricular size is normal.  3. The mitral valve is abnormal. No evidence of mitral valve regurgitation.  4. The aortic valve is tricuspid. Aortic valve regurgitation is not visualized. Mild to moderate aortic valve sclerosis/calcification is present, without any evidence of aortic stenosis.  5. The inferior vena cava is normal in size with greater than 50% respiratory variability, suggesting right atrial pressure of 3 mmHg. FINDINGS  Left Ventricle: Left ventricular ejection fraction, by estimation, is 55 to 60%. The left ventricle has normal function. The left ventricle has no regional wall motion abnormalities. The left ventricular internal cavity size was normal in size. There is  no left ventricular hypertrophy. Left ventricular diastolic parameters are indeterminate. Right Ventricle: The right ventricular size is normal. Right vetricular wall thickness was not assessed. Right ventricular systolic function is low normal. Left Atrium: Left atrial size was normal in size. Right Atrium: Right atrial size was normal in size. Pericardium: There is no evidence of pericardial effusion. Mitral Valve: The mitral valve is abnormal. There is mild thickening of the mitral valve leaflet(s). Mild mitral annular calcification. No evidence of mitral valve regurgitation. MV peak gradient, 4.7 mmHg. The mean mitral valve gradient is 2.0 mmHg. Tricuspid Valve: The tricuspid valve is normal in structure. Tricuspid valve regurgitation is not demonstrated. Aortic Valve: The aortic valve is tricuspid. Aortic valve regurgitation is not visualized. Mild to moderate aortic valve sclerosis/calcification is present, without any evidence of aortic stenosis. Pulmonic Valve: The pulmonic valve was not well visualized. Pulmonic valve  regurgitation is not visualized. Aorta: The aortic root is normal in size and structure. Venous: The inferior vena cava is normal in size with greater than 50% respiratory variability, suggesting right atrial pressure of 3 mmHg. IAS/Shunts: The interatrial septum was not assessed.  LEFT VENTRICLE PLAX 2D LVIDd:         4.00 cm     Diastology LVIDs:         2.70 cm     LV e' medial:    4.90 cm/s LV PW:         0.90 cm     LV E/e' medial:  15.7 LV IVS:        0.90 cm     LV e' lateral:   5.22 cm/s LVOT diam:     1.80 cm     LV E/e' lateral: 14.8 LVOT Area:     2.54 cm  LV Volumes (MOD) LV vol d, MOD A2C: 29.3 ml LV vol d, MOD A4C: 34.8 ml LV vol s, MOD A2C: 13.5 ml LV vol s, MOD A4C: 18.3 ml LV SV MOD A2C:     15.8 ml LV SV MOD A4C:     34.8 ml LV SV MOD BP:      16.5 ml RIGHT VENTRICLE TAPSE (M-mode): 2.1 cm LEFT ATRIUM  Index LA diam:        2.30 cm 1.24 cm/m LA Vol (A2C):   8.8 ml  4.78 ml/m LA Vol (A4C):   48.1 ml 26.01 ml/m LA Biplane Vol: 22.0 ml 11.90 ml/m                        PULMONIC VALVE AORTA                 PV Vmax:       1.25 m/s Ao Root diam: 3.00 cm PV Vmean:      86.600 cm/s                       PV VTI:        0.190 m                       PV Peak grad:  6.2 mmHg                       PV Mean grad:  3.0 mmHg  MITRAL VALVE MV Area (PHT): 2.24 cm    SHUNTS MV Peak grad:  4.7 mmHg    Systemic Diam: 1.80 cm MV Mean grad:  2.0 mmHg MV Vmax:       1.08 m/s MV Vmean:      65.6 cm/s MV Decel Time: 338 msec MV E velocity: 77.10 cm/s MV A velocity: 87.50 cm/s MV E/A ratio:  0.88 Dorris Carnes MD Electronically signed by Dorris Carnes MD Signature Date/Time: 01/31/2021/5:43:22 PM    Final    [place] EKG SITE RITE  Result Date: 01/31/2021 If Site Rite image not attached, placement could not be confirmed due to current cardiac rhythm.    CT Abdomen Pelvis Wo Contrast  Result Date: 01/26/2021 CLINICAL DATA:  Unspecified abdominal pain, vomiting EXAM: CT ABDOMEN AND PELVIS WITHOUT CONTRAST TECHNIQUE:  Multidetector CT imaging of the abdomen and pelvis was performed following the standard protocol without IV contrast. COMPARISON:  01/06/2021 FINDINGS: Lower chest: Large hiatal hernia is present which appears fluid-filled. The visualized lung bases are clear. The visualized heart and pericardium are unremarkable. Hepatobiliary: Stable cyst within the left hepatic lobe. No definite solid intrahepatic mass identified on this noncontrast examination. Cholecystectomy has been performed. No intra or extrahepatic biliary ductal dilation. Pancreas: Unremarkable Spleen: Unremarkable Adrenals/Urinary Tract: The adrenal glands are unremarkable. The kidneys are normal in size and position. 6 mm nonobstructing calculus noted within the lower pole of the right [name]. The kidneys are otherwise unremarkable. The bladder is unremarkable. Stomach/Bowel: There is a high-grade proximal large bowel obstruction involving the mid ascending colon with the point transition best seen axial image # 39/2 and coronal image # 87/5. 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 An underlying intrinsic neoplasm, however, is not excluded on this examination. Proximally, the small bowel is dilated and fluid-filled. Distally, the large bowel is decompressed. There is moderate sigmoid diverticulosis. No free intraperitoneal gas or fluid. Vascular/Lymphatic: Moderate atherosclerotic calcification within the abdominal aorta. No aortic aneurysm. No pathologic adenopathy within the abdomen and pelvis. Reproductive: Status post hysterectomy. No adnexal masses. Other: Tiny fat containing umbilical hernia. The rectum is unremarkable. Musculoskeletal: No acute bone abnormality. No lytic or blastic bone lesion. IMPRESSION: High-grade proximal large bowel obstruction involving the mid ascending colon. Given the extensive inflammatory changes noted on prior examination, this may  represent a post  inflammatory stricture, however, correlation with endoscopy is recommended as an underlying neoplasm could appear similarly. Mild right nonobstructing nephrolithiasis. Large hiatal hernia Aortic Atherosclerosis (ICD10-I70.0). Electronically Signed   By: Fidela Salisbury MD   On: 01/26/2021 21:50   DG Abdomen 1 View  Result Date: 01/27/2021 CLINICAL DATA:  NG tube placement. EXAM: ABDOMEN - 1 VIEW COMPARISON:  Jan 26, 2021 FINDINGS: A nasogastric tube is seen with its distal end looped upon itself, overlying the medial aspect of the right lung base and adjacent portion of the thoracic spine. This is likely within the patient's known large gastric hernia. The bowel gas pattern is normal. Radiopaque surgical clips are seen overlying the right upper quadrant. No radio-opaque calculi or other significant radiographic abnormality are seen. IMPRESSION: Nasogastric tube positioning, as described above Electronically Signed   By: Virgina Norfolk M.D.   On: 01/27/2021 02:26   CT CHEST W CONTRAST  Result Date: 01/30/2021 CLINICAL DATA:  Colorectal cancer staging, obstructing ascending colon mass EXAM: CT CHEST WITH CONTRAST TECHNIQUE: Multidetector CT imaging of the chest was performed during intravenous contrast administration. CONTRAST:  19m OMNIPAQUE IOHEXOL 300 MG/ML  SOLN COMPARISON:  CT abdomen pelvis, 01/26/2021, CT chest, 08/11/2008 FINDINGS: Cardiovascular: No significant vascular findings. Normal heart size. No pericardial effusion. Mediastinum/Nodes: Prominent mediastinal lymph nodes, largest left hilar/AP window nodes measuring 1.8 x 1.2 cm (series 2, image 51). Moderate hiatal hernia with intrathoracic position of the gastric fundus. Status post thyroidectomy. Trachea and esophagus demonstrate no significant findings. Lungs/Pleura: There are multiple small bilateral pulmonary nodules, including a 5 mm nodule of the dependent right lower lobe (series 5, image 99), a 6 mm nodule of the posterior left upper  lobe (series 5, image 52), a 6 mm nodule of the central left lower lobe (series 5, image 91) and a 5 mm nodule of the dependent left lower lobe (series 5, image 93). No pleural effusion or pneumothorax. Upper Abdomen: No acute abnormality. Esophagogastric tube, tip positioned in the duodenal bulb. Musculoskeletal: No chest wall mass or suspicious bone lesions identified. IMPRESSION: 1. There are multiple small bilateral pulmonary nodules, measuring 6 mm and smaller, nonspecific although suspicious for pulmonary metastatic disease. These are new compared to very remote prior CT of the chest dated 08/11/2008 and likely below size threshold for confident PET-CT characterization. 2. Prominent mediastinal lymph nodes, similar in appearance to remote prior CT dated 08/11/2008 and likely benign and reactive. 3. Moderate hiatal hernia with intrathoracic position of the gastric fundus. 4. Esophagogastric tube, tip positioned in the duodenal bulb. Electronically Signed   By: AEddie CandleM.D.   On: 01/30/2021 20:44   CT ABDOMEN PELVIS W CONTRAST  Result Date: 01/07/2021 CLINICAL DATA:  Abdominal pain, nausea, diarrhea, bloating cramping. 40 pound weight loss over 6 months. EXAM: CT ABDOMEN AND PELVIS WITH CONTRAST TECHNIQUE: Multidetector CT imaging of the abdomen and pelvis was performed using the standard protocol following bolus administration of intravenous contrast. CONTRAST:  1045mISOVUE-300 IOPAMIDOL (ISOVUE-300) INJECTION 61% COMPARISON:  CT of the abdomen and pelvis without contrast on 06/19/2009 FINDINGS: Lower chest: No acute findings.  Stable large hiatal hernia. Hepatobiliary: The liver is unremarkable and demonstrates no masses. There is a small simple cyst in the inferior right lobe measuring approximately 9 mm. The gallbladder has been removed. Mild dilatation of the common bile duct up to approximately 10 mm in diameter is likely attributable to prior cholecystectomy. Pancreas: Unremarkable. No  pancreatic ductal dilatation or surrounding inflammatory changes.  Spleen: Normal in size without focal abnormality. Adrenals/Urinary Tract: Adrenal glands are unremarkable. Kidneys are normal, without renal calculi, focal lesion, or hydronephrosis. Bladder is unremarkable. Stomach/Bowel: Bowel demonstrates abnormal thickening and luminal narrowing involving the cecum and ascending colon up to roughly the level of the mid ascending colon. There also appears to be contiguous thickening in the terminal ileum. Some surrounding infiltrative appearance of the pericolonic fat is noted as well as some potential small lymph nodes measuring up to 6 mm. Differential considerations include carcinoma, inflammatory bowel disease or focal colitis also involving the terminal ileum. No evidence of associated small bowel obstruction, abscess or bowel perforation. Diverticulosis of the sigmoid colon present without evidence of acute diverticulitis. Vascular/Lymphatic: Atherosclerosis of the distal abdominal aorta without evidence of aneurysm. No enlarged retroperitoneal or iliac lymph nodes. Reproductive: Status post hysterectomy. No adnexal masses. Other: No abdominal wall hernia or abnormality. No abdominopelvic ascites. Musculoskeletal: No acute or significant osseous findings. IMPRESSION: 1. Abnormal thickening and luminal narrowing involving the cecum and ascending colon up to roughly the level of the mid ascending colon. There also appears to be contiguous thickening in the terminal ileum. Differential considerations include carcinoma, inflammatory bowel disease or focal colitis also involving the terminal ileum. No evidence of associated small bowel obstruction, abscess or bowel perforation. Correlation with evidence of blood in the stool is recommended as well as direct visualization with colonoscopy. 2. Stable large hiatal hernia. 3. Aortic atherosclerosis without evidence of aneurysm. Aortic Atherosclerosis (ICD10-I70.0).  Electronically Signed   By: Aletta Edouard M.D.   On: 01/07/2021 15:08   DG Chest Port 1 View  Result Date: 01/26/2021 CLINICAL DATA:  Onset abdominal pain and vomiting this morning. EXAM: PORTABLE CHEST 1 VIEW COMPARISON:  PA and lateral chest 04/08/2014. FINDINGS: Lungs clear. Heart size normal. Hiatal hernia again seen. No pneumothorax or pleural fluid. No acute or focal bony abnormality. IMPRESSION: No acute disease. Hiatal hernia. Electronically Signed   By: Inge Rise M.D.   On: 01/26/2021 20:00   DG Abd Portable 1 View  Result Date: 01/26/2021 CLINICAL DATA:  Onset abdominal pain and vomiting this morning. EXAM: PORTABLE ABDOMEN - 1 VIEW COMPARISON:  None. FINDINGS: Gas-filled loops of small bowel are dilated up to approximately 4.3 cm. There is some gas in the ascending colon. No unexpected abdominal calcification is seen. No acute bony abnormality. IMPRESSION: Dilated loops of small bowel with gas in the colon worrisome for early or partial small bowel obstruction. Electronically Signed   By: Inge Rise M.D.   On: 01/26/2021 20:02   DG INTRO LONG GI TUBE  Result Date: 01/27/2021 CLINICAL DATA:  Small-bowel obstruction. Feeding tube for decompression. EXAM: FL FEEDING TUBE PLACEMENT CONTRAST:  50 mL of Omnipaque 300 through the NG tube FLUOROSCOPY TIME:  Fluoroscopy Time:  3 minutes 12 second Radiation Exposure Index (if provided by the fluoroscopic device): Number of Acquired Spot Images: 2 COMPARISON:  CT abdomen pelvis 01/26/2021 FINDINGS: Review of the prior CT reveals a large hiatal hernia and small-bowel obstruction. NG tube passes through the right nares after lubrication and anesthesia with lidocaine lubricant. NG tube placed into the hiatal hernia. It was difficult to enter the stomach below the hernia. Addition of the Amplatz wire and injection of contrast eventually allowed the tube to pass into the stomach. Final tube position is in the antrum of the stomach. IMPRESSION:  Large hiatal hernia. Feeding tube placed with the tip in the antrum of the stomach. Electronically Signed   By:  Franchot Gallo M.D.   On: 01/27/2021 10:20   ECHOCARDIOGRAM COMPLETE  Result Date: 01/31/2021    ECHOCARDIOGRAM REPORT   Patient Name:   Angelica [name] Godinho Date of Exam: 01/31/2021 Medical Rec #:  [id number]     Height:       63.0 in Accession #:    [id number]    Weight:       180.0 lb Date of Birth:  1941/01/08     BSA:          1.849 m Patient Age:    58 years      BP:           127/62 mmHg Patient Gender: F             HR:           81 bpm. Exam Location:  Inpatient Procedure: 2D Echo, Color Doppler and Cardiac Doppler Indications:    Pre op eval  History:        Patient has no prior history of Echocardiogram examinations.                 COPD; Risk Factors:Hypertension.  Sonographer:    Kandice Robinsons RDCS Referring Phys: [id number] Little Ishikawa  Sonographer Comments: Patient is morbidly obese. Image acquisition challenging due to patient body habitus and Image acquisition challenging due to respiratory motion. IMPRESSIONS  1. Left ventricular ejection fraction, by estimation, is 55 to 60%. The left ventricle has normal function. The left ventricle has no regional wall motion abnormalities. Left ventricular diastolic parameters are indeterminate.  2. Right ventricular systolic function is low normal. The right ventricular size is normal.  3. The mitral valve is abnormal. No evidence of mitral valve regurgitation.  4. The aortic valve is tricuspid. Aortic valve regurgitation is not visualized. Mild to moderate aortic valve sclerosis/calcification is present, without any evidence of aortic stenosis.  5. The inferior vena cava is normal in size with greater than 50% respiratory variability, suggesting right atrial pressure of 3 mmHg. FINDINGS  Left Ventricle: Left ventricular ejection fraction, by estimation, is 55 to 60%. The left ventricle has normal function. The left ventricle has no regional wall  motion abnormalities. The left ventricular internal cavity size was normal in size. There is  no left ventricular hypertrophy. Left ventricular diastolic parameters are indeterminate. Right Ventricle: The right ventricular size is normal. Right vetricular wall thickness was not assessed. Right ventricular systolic function is low normal. Left Atrium: Left atrial size was normal in size. Right Atrium: Right atrial size was normal in size. Pericardium: There is no evidence of pericardial effusion. Mitral Valve: The mitral valve is abnormal. There is mild thickening of the mitral valve leaflet(s). Mild mitral annular calcification. No evidence of mitral valve regurgitation. MV peak gradient, 4.7 mmHg. The mean mitral valve gradient is 2.0 mmHg. Tricuspid Valve: The tricuspid valve is normal in structure. Tricuspid valve regurgitation is not demonstrated. Aortic Valve: The aortic valve is tricuspid. Aortic valve regurgitation is not visualized. Mild to moderate aortic valve sclerosis/calcification is present, without any evidence of aortic stenosis. Pulmonic Valve: The pulmonic valve was not well visualized. Pulmonic valve regurgitation is not visualized. Aorta: The aortic root is normal in size and structure. Venous: The inferior vena cava is normal in size with greater than 50% respiratory variability, suggesting right atrial pressure of 3 mmHg. IAS/Shunts: The interatrial septum was not assessed.  LEFT VENTRICLE PLAX 2D LVIDd:         4.00  cm     Diastology LVIDs:         2.70 cm     LV e' medial:    4.90 cm/s LV PW:         0.90 cm     LV E/e' medial:  15.7 LV IVS:        0.90 cm     LV e' lateral:   5.22 cm/s LVOT diam:     1.80 cm     LV E/e' lateral: 14.8 LVOT Area:     2.54 cm  LV Volumes (MOD) LV vol d, MOD A2C: 29.3 ml LV vol d, MOD A4C: 34.8 ml LV vol s, MOD A2C: 13.5 ml LV vol s, MOD A4C: 18.3 ml LV SV MOD A2C:     15.8 ml LV SV MOD A4C:     34.8 ml LV SV MOD BP:      16.5 ml RIGHT VENTRICLE TAPSE (M-mode):  2.1 cm LEFT ATRIUM             Index LA diam:        2.30 cm 1.24 cm/m LA Vol (A2C):   8.8 ml  4.78 ml/m LA Vol (A4C):   48.1 ml 26.01 ml/m LA Biplane Vol: 22.0 ml 11.90 ml/m                        PULMONIC VALVE AORTA                 PV Vmax:       1.25 m/s Ao Root diam: 3.00 cm PV Vmean:      86.600 cm/s                       PV VTI:        0.190 m                       PV Peak grad:  6.2 mmHg                       PV Mean grad:  3.0 mmHg  MITRAL VALVE MV Area (PHT): 2.24 cm    SHUNTS MV Peak grad:  4.7 mmHg    Systemic Diam: 1.80 cm MV Mean grad:  2.0 mmHg MV Vmax:       1.08 m/s MV Vmean:      65.6 cm/s MV Decel Time: 338 msec MV E velocity: 77.10 cm/s MV A velocity: 87.50 cm/s MV E/A ratio:  0.88 Dorris Carnes MD Electronically signed by Dorris Carnes MD Signature Date/Time: 01/31/2021/5:43:22 PM    Final    [place] EKG SITE RITE  Result Date: 01/31/2021 If Site Rite image not attached, placement could not be confirmed due to current cardiac rhythm.   Pathology:  SURGICAL PATHOLOGY  CASE: [id number]  PATIENT: Angelica [name]  Surgical Pathology Report   Clinical History: large bowel obstruction   FINAL MICROSCOPIC DIAGNOSIS:   A. ASCENDING COLON BIOPSY:  - Moderately differentiated colorectal adenocarcinoma.  - See comment.   COMMENT:  Dr. Melina Copa agrees.   Assessment and Plan:  1.  Colon adenocarcinoma 2.  Chronic iron deficiency anemia 3.  Small pulmonary nodules noted on CT scan, too small to characterize 4.  COPD 5.  Hypothyroidism with remote history of thyroid cancer  -Discussed initial biopsy from colonoscopy with the patient.  Biopsy results consistent with adenocarcinoma.  We discussed  CT of the chest findings consistent with small pulmonary nodules.  These are too small to characterize.  We will continue to follow on future CT scans. Await surgical pathology report.  Discussed that staging and treatment options depend upon surgical pathology results. -The patient has been followed  by our office for chronic iron deficiency anemia.  She has received IV iron, vitamin B12, and blood transfusions in the past.  Her hemoglobin is normal at this time.  We will continue to monitor. -Continue Synthroid for hypothyroidism.  She will have ongoing management per primary care provider.  Thank you for this referral.   Mikey Bussing, DNP, AGPCNP-BC, AOCNP  Addendum I have seen the patient, examined her. I agree with the assessment and and plan and have edited the notes.   Ms Tri is a 80 yo female with PMH of IDA, HTN, but otherwise healthy and independent, presented with bowel obstruction.  Further work-up revealed a large ascending colon mass.  She had right hemicolectomy yesterday, surgical path still pending.  I have reviewed her staging CT scan, which showed indeterminate small nodules, no other evidence of metastasis.  No intra-abdominal metastasis was found during her surgery.  I discussed the role of adjuvant chemotherapy for node positive stage III colon cancer.  Due to her advanced age, limited social support, she would not be a candidate for FOLFOX or [place] ox.  If she does have positive lymph nodes, we will discuss oral Xeloda as adjuvant therapy. She will stay focus on surgical recovery now, and I will see her back in office in about 3 weeks.  Truitt Merle  02/04/2021

## 2021-02-03 NOTE — Consult Note (Incomplete Revision)
[place]  [phone] [phone]   MEDICAL ONCOLOGY - INITIAL CONSULTATION  Referral MD: Dr. Irine Seal  Reason for Referral: Colon adenocarcinoma  HPI: [name] is an 80 year old female with a past medical history significant for remote history of thyroid cancer, celiac disease, COPD, hypertension, and iron deficiency anemia.  She presented to the emergency room with abdominal pain, bloating, persistent nausea and vomiting.  The patient was seen as an outpatient and referred for a CT of the abdomen/pelvis which was performed on 01/06/2021 which showed abnormal thickening and luminal narrowing involving the cecum and ascending colon up to roughly the level of the mid ascending colon, stable large hiatal hernia.  Plan was for an outpatient colonoscopy in June but due to worsening symptoms, she presented to the emergency room.  Repeat CT abdomen/pelvis on admission showed high-grade proximal large bowel obstruction involving the mid ascending colon concerning for underlying neoplasm.  She underwent colonoscopy on 01/29/2021 which showed a likely malignant completely obstructing tumor in the distal ascending colon which was biopsied. 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  A CEA was obtained on 01/29/2021 and was mildly elevated at 5.8. She had a CT of the chest performed 01/30/2021 which showed multiple small bilateral pulmonary nodules measuring 6 mm and smaller which are nonspecific but suspicious for pulmonary metastatic disease, prominent mediastinal lymph nodes which were similar in appearance to prior CT from November 2009 which are likely benign and reactive.  The patient was taken to the OR on 02/02/2021 for laparoscopic right colectomy due to her obstructing ascending colon cancer.  Surgical pathology is currently pending.  The patient was seen in her hospital room today.  Her friend, Angelica [name], was at the bedside.  The patient  reports that she has had a decreased appetite due to abdominal pain and nausea and vomiting after she eats.  She estimates that she has lost about 50 pounds over the past 6 months.  Her to admission, she was also experiencing constipation.  When her bowels did move she reported that her stools were liquid but brown in color.  She had not noticed any melena or hematochezia.  She reports abdominal tenderness since surgery.  NG tube in place and she is not having any nausea or vomiting at this time. Denies flatus.  No bleeding has been reported.  The patient is widowed.  She had 2 sons who are both deceased.  She reports that she has multiple neighbors and friends who check on her.  She is currently independent with ADLs.  She ambulates with a walker.  Denies history of alcohol use.  Previously smoked 3/4 pack of cigarettes x60 years.  Quit 2009.  Medical Oncology was asked to see the patient to make recommendations regarding her colon adenocarcinoma.    Past Medical History:  Diagnosis Date  . Anemia   . Arthritis   . Asthma   . Celiac disease 11/17/2013  . COPD (chronic obstructive pulmonary disease) (HCC)    on 2L prn home O2  . Hypertension   . Rectal bleed 10/23/2018  . Thyroid cancer ([place])    remote  :  Past Surgical History:  Procedure Laterality Date  . ABDOMINAL HYSTERECTOMY    . BIOPSY  01/29/2021   Procedure: BIOPSY;  Surgeon: Ronnette Juniper, MD;  Location: [facility] ENDOSCOPY;  Service: Gastroenterology;;  . BREAST EXCISIONAL BIOPSY Bilateral   . CHOLECYSTECTOMY    . COLONOSCOPY  07/05/2012   Procedure: COLONOSCOPY;  Surgeon:  Winfield Cunas., MD;  Location: [facility] ENDOSCOPY;  Service: Endoscopy;  Laterality: N/A;  . COLONOSCOPY N/A 03/12/2015   Procedure: COLONOSCOPY;  Surgeon: Laurence Spates, MD;  Location: [facility] ENDOSCOPY;  Service: Endoscopy;  Laterality: N/A;  . COLONOSCOPY N/A 01/29/2021   Procedure: COLONOSCOPY;  Surgeon: Ronnette Juniper, MD;  Location: [facility] ENDOSCOPY;  Service: Gastroenterology;   Laterality: N/A;  . ESOPHAGOGASTRODUODENOSCOPY  07/05/2012   Procedure: ESOPHAGOGASTRODUODENOSCOPY (EGD);  Surgeon: Winfield Cunas., MD;  Location: [facility] ENDOSCOPY;  Service: Endoscopy;  Laterality: N/A;  . HOT HEMOSTASIS N/A 03/12/2015   Procedure: HOT HEMOSTASIS (ARGON PLASMA COAGULATION/BICAP);  Surgeon: Laurence Spates, MD;  Location: [facility] ENDOSCOPY;  Service: Endoscopy;  Laterality: N/A;  . LAPAROSCOPIC PARTIAL COLECTOMY N/A 02/02/2021   Procedure: LAPAROSCOPIC ASSISTED PARTIAL COLECTOMY;  Surgeon: Stark Klein, MD;  Location: [facility] ORS;  Service: General;  Laterality: N/A;  . SUBMUCOSAL TATTOO INJECTION  01/29/2021   Procedure: SUBMUCOSAL TATTOO INJECTION;  Surgeon: Ronnette Juniper, MD;  Location: [facility] ENDOSCOPY;  Service: Gastroenterology;;  . TONSILLECTOMY    :  Current Facility-Administered Medications  Medication Dose Route Frequency Provider Last Rate Last Admin  . 0.9 %  sodium chloride infusion (Manually program via Guardrails IV Fluids)   Intravenous Once Stark Klein, MD      . acetaminophen (OFIRMEV) IV 1,000 mg  1,000 mg Intravenous Q6H Norm Parcel, PA-C      . Benzocaine (HURRCAINE) 20 % mouth spray   Mouth/Throat QID PRN Stark Klein, MD      . Chlorhexidine Gluconate Cloth 2 % PADS 6 each  6 each Topical Daily Stark Klein, MD   6 each at 02/03/21 0856  . dextrose 5 %-0.45 % sodium chloride infusion   Intravenous Continuous Stark Klein, MD 10 mL/hr at 02/02/21 1804 Infusion Verify at 02/02/21 1804  . enoxaparin (LOVENOX) injection 40 mg  40 mg Subcutaneous Q24H Stark Klein, MD   40 mg at 02/01/21 2100  . insulin aspart (novoLOG) injection 0-9 Units  0-9 Units Subcutaneous Q6H Stark Klein, MD   2 Units at 02/03/21 0710  . levothyroxine (SYNTHROID, LEVOTHROID) injection 62.5 mcg  62.5 mcg Intravenous Daily Stark Klein, MD   62.5 mcg at 02/03/21 0854  . lip balm (CARMEX) ointment   Topical PRN Stark Klein, MD   Given at 02/02/21 2136  . magnesium sulfate IVPB 1 g 100 mL  1  g Intravenous Once Efraim Kaufmann, RPH      . methocarbamol (ROBAXIN) 500 mg in dextrose 5 % 50 mL IVPB  500 mg Intravenous Q8H Johnson, Kelly R, PA-C      . morphine 2 MG/ML injection 1-2 mg  1-2 mg Intravenous Q2H PRN Norm Parcel, PA-C   2 mg at 02/03/21 0851  . ondansetron (ZOFRAN) injection 4-8 mg  4-8 mg Intravenous Q6H PRN Stark Klein, MD      . pantoprazole (PROTONIX) injection 40 mg  40 mg Intravenous Daily Stark Klein, MD   40 mg at 02/03/21 0854  . sodium chloride flush (NS) 0.9 % injection 10-40 mL  10-40 mL Intracatheter PRN Stark Klein, MD      . TPN ADULT (ION)   Intravenous Continuous TPN Stark Klein, MD 100 mL/hr at 02/02/21 1804 New Bag at 02/02/21 1804  . TPN ADULT (ION)   Intravenous Continuous TPN Efraim Kaufmann, [facility]         Allergies  Allergen Reactions  . Other Hives    Pt states she has several other allergies  but can't recall the names, pt recalled the medication to be codeine.  . Fosamax [Alendronate Sodium]   . Penicillins Other (See Comments)    Unknown Did it involve swelling of the face/tongue/throat, SOB, or low BP? Unknown Did it involve sudden or severe rash/hives, skin peeling, or any reaction on the inside of your mouth or nose? Unknown Did you need to seek medical attention at a hospital or doctor's office? Unknown When did it last happen? If all above answers are "NO", may proceed with cephalosporin use.  . Sulfa Antibiotics   . Codeine Rash    Blisters "   :  Family History  Problem Relation Age of Onset  . Heart disease Mother   . [name] failure Father   . Cancer Sister        thyroid cancer  :  Social History   Socioeconomic History  . Marital status: Widowed    Spouse name: Not on file  . Number of children: Not on file  . Years of education: Not on file  . Highest education level: Not on file  Occupational History  . Occupation: retired  Tobacco Use  . Smoking status: Former Smoker    Packs/day: 0.75     Years: 60.00    Pack years: 45.00    Types: Cigarettes    Quit date: 07/03/2008    Years since quitting: 12.5  . Smokeless tobacco: Never Used  Vaping Use  . Vaping Use: Never used  Substance and Sexual Activity  . Alcohol use: No  . Drug use: No  . Sexual activity: Not on file  Other Topics Concern  . Not on file  Social History Narrative  . Not on file   Social Determinants of Health   Financial Resource Strain: Not on file  Food Insecurity: Not on file  Transportation Needs: Not on file  Physical Activity: Not on file  Stress: Not on file  Social Connections: Not on file  Intimate Partner Violence: Not on file  :  Review of Systems: A comprehensive 14 point review of systems was negative except as noted in the HPI.  Exam: Patient Vitals for the past 24 hrs:  BP Temp Temp src Pulse Resp SpO2  02/03/21 0556 129/73 98.4 F (36.9 C) Oral 84 20 98 %  02/03/21 0203 (!) 141/83 98.7 F (37.1 C) Oral 94 16 96 %  02/02/21 2205 132/83 98.9 F (37.2 C) Oral 92 16 95 %  02/02/21 1859 134/75 98.6 F (37 C) Oral 92 20 96 %  02/02/21 1740 136/76 98.9 F (37.2 C) Oral 93 20 93 %  02/02/21 1715 134/72 99 F (37.2 C) - 90 20 93 %  02/02/21 1700 133/75 - - 92 (!) 21 94 %  02/02/21 1645 125/70 - - 92 20 94 %  02/02/21 1630 (!) 154/82 - - 93 (!) 25 100 %  02/02/21 1624 (!) 150/76 99.1 F (37.3 C) - 96 (!) 22 100 %  02/02/21 1215 (!) 159/90 - - 87 16 97 %    General: Awake and alert, no distress Eyes:  no scleral icterus.   ENT: NG tube in place, no thrush or mucositis   Lymphatics:  Negative cervical, supraclavicular or axillary adenopathy.   Respiratory: lungs were clear bilaterally without wheezing or crackles.   Cardiovascular:  Regular rate and rhythm, S1/S2, without murmur, rub or gallop.  There was no pedal edema.   GI: Very faint bowel sounds, soft, tenderness with palpation, honeycomb dressing without  drainage. Musculoskeletal: Strength symmetrical in the upper and  lower extremities. Skin exam was without echymosis, petichae.   Neuro exam was nonfocal. Patient was alert and oriented.  Attention was good.   Language was appropriate.  Mood was normal without depression.  Speech was not pressured.  Thought content was not tangential.     Lab Results  Component Value Date   WBC 12.6 (H) 02/03/2021   HGB 12.0 02/03/2021   HCT 37.2 02/03/2021   PLT 236 02/03/2021   GLUCOSE 178 (H) 02/03/2021   TRIG 110 02/01/2021   ALT 15 02/03/2021   AST 17 02/03/2021   NA 135 02/03/2021   K 5.0 02/03/2021   CL 101 02/03/2021   CREATININE 0.58 02/03/2021   BUN 18 02/03/2021   CO2 30 02/03/2021    CT Abdomen Pelvis Wo Contrast  Result Date: 01/26/2021 CLINICAL DATA:  Unspecified abdominal pain, vomiting EXAM: CT ABDOMEN AND PELVIS WITHOUT CONTRAST TECHNIQUE: Multidetector CT imaging of the abdomen and pelvis was performed following the standard protocol without IV contrast. COMPARISON:  01/06/2021 FINDINGS: Lower chest: Large hiatal hernia is present which appears fluid-filled. The visualized lung bases are clear. The visualized heart and pericardium are unremarkable. Hepatobiliary: Stable cyst within the left hepatic lobe. No definite solid intrahepatic mass identified on this noncontrast examination. Cholecystectomy has been performed. No intra or extrahepatic biliary ductal dilation. Pancreas: Unremarkable Spleen: Unremarkable Adrenals/Urinary Tract: The adrenal glands are unremarkable. The kidneys are normal in size and position. 6 mm nonobstructing calculus noted within the lower pole of the right [name]. The kidneys are otherwise unremarkable. The bladder is unremarkable. Stomach/Bowel: There is a high-grade proximal large bowel obstruction involving the mid ascending colon with the point transition best seen axial image # 39/2 and coronal image # 87/5. 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 An  underlying intrinsic neoplasm, however, is not excluded on this examination. Proximally, the small bowel is dilated and fluid-filled. Distally, the large bowel is decompressed. There is moderate sigmoid diverticulosis. No free intraperitoneal gas or fluid. Vascular/Lymphatic: Moderate atherosclerotic calcification within the abdominal aorta. No aortic aneurysm. No pathologic adenopathy within the abdomen and pelvis. Reproductive: Status post hysterectomy. No adnexal masses. Other: Tiny fat containing umbilical hernia. The rectum is unremarkable. Musculoskeletal: No acute bone abnormality. No lytic or blastic bone lesion. IMPRESSION: High-grade proximal large bowel obstruction involving the mid ascending colon. 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 Mild right nonobstructing nephrolithiasis. Large hiatal hernia Aortic Atherosclerosis (ICD10-I70.0). Electronically Signed   By: Fidela Salisbury MD   On: 01/26/2021 21:50   DG Abdomen 1 View  Result Date: 01/27/2021 CLINICAL DATA:  NG tube placement. EXAM: ABDOMEN - 1 VIEW COMPARISON:  Jan 26, 2021 FINDINGS: A nasogastric tube is seen with its distal end looped upon itself, overlying the medial aspect of the right lung base and adjacent portion of the thoracic spine. This is likely within the patient's known large gastric hernia. The bowel gas pattern is normal. Radiopaque surgical clips are seen overlying the right upper quadrant. No radio-opaque calculi or other significant radiographic abnormality are seen. IMPRESSION: Nasogastric tube positioning, as described above Electronically Signed   By: Virgina Norfolk M.D.   On: 01/27/2021 02:26   CT CHEST W CONTRAST  Result Date: 01/30/2021 CLINICAL DATA:  Colorectal cancer staging, obstructing ascending colon  mass EXAM: CT CHEST WITH  CONTRAST TECHNIQUE: Multidetector CT imaging of the chest was performed during intravenous contrast administration. CONTRAST:  81m OMNIPAQUE IOHEXOL 300 MG/ML  SOLN COMPARISON:  CT abdomen pelvis, 01/26/2021, CT chest, 08/11/2008 FINDINGS: Cardiovascular: No significant vascular findings. Normal heart size. No pericardial effusion. Mediastinum/Nodes: Prominent mediastinal lymph nodes, largest left hilar/AP window nodes measuring 1.8 x 1.2 cm (series 2, image 51). Moderate hiatal hernia with intrathoracic position of the gastric fundus. Status post thyroidectomy. Trachea and esophagus demonstrate no significant findings. Lungs/Pleura: There are multiple small bilateral pulmonary nodules, including a 5 mm nodule of the dependent right lower lobe (series 5, image 99), a 6 mm nodule of the posterior left upper lobe (series 5, image 52), a 6 mm nodule of the central left lower lobe (series 5, image 91) and a 5 mm nodule of the dependent left lower lobe (series 5, image 93). No pleural effusion or pneumothorax. Upper Abdomen: No acute abnormality. Esophagogastric tube, tip positioned in the duodenal bulb. Musculoskeletal: No chest wall mass or suspicious bone lesions identified. IMPRESSION: 1. There are multiple small bilateral pulmonary nodules, measuring 6 mm and smaller, nonspecific although suspicious for pulmonary metastatic disease. These are new compared to very remote prior CT of the chest dated 08/11/2008 and likely below size threshold for confident PET-CT characterization. 2. Prominent mediastinal lymph nodes, similar in appearance to remote prior CT dated 08/11/2008 and likely benign and reactive. 3. Moderate hiatal hernia with intrathoracic position of the gastric fundus. 4. Esophagogastric tube, tip positioned in the duodenal bulb. Electronically Signed   By: AEddie CandleM.D.   On: 01/30/2021 20:44   CT ABDOMEN PELVIS W CONTRAST  Result Date: 01/07/2021 CLINICAL DATA:  Abdominal pain, nausea, diarrhea,  bloating cramping. 40 pound weight loss over 6 months. EXAM: CT ABDOMEN AND PELVIS WITH CONTRAST TECHNIQUE: Multidetector CT imaging of the abdomen and pelvis was performed using the standard protocol following bolus administration of intravenous contrast. CONTRAST:  1061mISOVUE-300 IOPAMIDOL (ISOVUE-300) INJECTION 61% COMPARISON:  CT of the abdomen and pelvis without contrast on 06/19/2009 FINDINGS: Lower chest: No acute findings.  Stable large hiatal hernia. Hepatobiliary: The liver is unremarkable and demonstrates no masses. There is a small simple cyst in the inferior right lobe measuring approximately 9 mm. The gallbladder has been removed. Mild dilatation of the common bile duct up to approximately 10 mm in diameter is likely attributable to prior cholecystectomy. Pancreas: Unremarkable. No pancreatic ductal dilatation or surrounding inflammatory changes. Spleen: Normal in size without focal abnormality. Adrenals/Urinary Tract: Adrenal glands are unremarkable. Kidneys are normal, without renal calculi, focal lesion, or hydronephrosis. Bladder is unremarkable. Stomach/Bowel: Bowel demonstrates abnormal thickening and luminal narrowing involving the cecum and ascending colon up to roughly the level of the mid ascending colon. There also appears to be contiguous thickening in the terminal ileum. Some surrounding infiltrative appearance of the pericolonic fat is noted as well as some potential small lymph nodes measuring up to 6 mm. Differential considerations include carcinoma, inflammatory bowel disease or focal colitis also involving the terminal ileum. No evidence of associated small bowel obstruction, abscess or bowel perforation. Diverticulosis of the sigmoid colon present without evidence of acute diverticulitis. Vascular/Lymphatic: Atherosclerosis of the distal abdominal aorta without evidence of aneurysm. No enlarged retroperitoneal or iliac lymph nodes. Reproductive: Status post hysterectomy. No adnexal  masses. Other: No abdominal wall hernia or abnormality. No abdominopelvic ascites. Musculoskeletal: No acute or significant osseous findings. IMPRESSION: 1. Abnormal thickening and luminal narrowing involving the cecum  and ascending colon up to roughly the level of the mid ascending colon. There also appears to be contiguous thickening in the terminal ileum. Differential considerations include carcinoma, inflammatory bowel disease or focal colitis also involving the terminal ileum. No evidence of associated small bowel obstruction, abscess or bowel perforation. Correlation with evidence of blood in the stool is recommended as well as direct visualization with colonoscopy. 2. Stable large hiatal hernia. 3. Aortic atherosclerosis without evidence of aneurysm. Aortic Atherosclerosis (ICD10-I70.0). Electronically Signed   By: Aletta Edouard M.D.   On: 01/07/2021 15:08   DG Chest Port 1 View  Result Date: 01/26/2021 CLINICAL DATA:  Onset abdominal pain and vomiting this morning. EXAM: PORTABLE CHEST 1 VIEW COMPARISON:  PA and lateral chest 04/08/2014. FINDINGS: Lungs clear. Heart size normal. Hiatal hernia again seen. No pneumothorax or pleural fluid. No acute or focal bony abnormality. IMPRESSION: No acute disease. Hiatal hernia. Electronically Signed   By: Inge Rise M.D.   On: 01/26/2021 20:00   DG Abd Portable 1 View  Result Date: 01/26/2021 CLINICAL DATA:  Onset abdominal pain and vomiting this morning. EXAM: PORTABLE ABDOMEN - 1 VIEW COMPARISON:  None. FINDINGS: Gas-filled loops of small bowel are dilated up to approximately 4.3 cm. There is some gas in the ascending colon. No unexpected abdominal calcification is seen. No acute bony abnormality. IMPRESSION: Dilated loops of small bowel with gas in the colon worrisome for early or partial small bowel obstruction. Electronically Signed   By: Inge Rise M.D.   On: 01/26/2021 20:02   DG INTRO LONG GI TUBE  Result Date: 01/27/2021 CLINICAL DATA:   Small-bowel obstruction. Feeding tube for decompression. EXAM: FL FEEDING TUBE PLACEMENT CONTRAST:  50 mL of Omnipaque 300 through the NG tube FLUOROSCOPY TIME:  Fluoroscopy Time:  3 minutes 12 second Radiation Exposure Index (if provided by the fluoroscopic device): Number of Acquired Spot Images: 2 COMPARISON:  CT abdomen pelvis 01/26/2021 FINDINGS: Review of the prior CT reveals a large hiatal hernia and small-bowel obstruction. NG tube passes through the right nares after lubrication and anesthesia with lidocaine lubricant. NG tube placed into the hiatal hernia. It was difficult to enter the stomach below the hernia. Addition of the Amplatz wire and injection of contrast eventually allowed the tube to pass into the stomach. Final tube position is in the antrum of the stomach. IMPRESSION: Large hiatal hernia. Feeding tube placed with the tip in the antrum of the stomach. Electronically Signed   By: Franchot Gallo M.D.   On: 01/27/2021 10:20   ECHOCARDIOGRAM COMPLETE  Result Date: 01/31/2021    ECHOCARDIOGRAM REPORT   Patient Name:   XIARA KNISLEY Vonada Date of Exam: 01/31/2021 Medical Rec #:  [id number]     Height:       63.0 in Accession #:    [id number]    Weight:       180.0 lb Date of Birth:  1940/12/19     BSA:          1.849 m Patient Age:    71 years      BP:           127/62 mmHg Patient Gender: F             HR:           81 bpm. Exam Location:  Inpatient Procedure: 2D Echo, Color Doppler and Cardiac Doppler Indications:    Pre op eval  History:        Patient  has no prior history of Echocardiogram examinations.                 COPD; Risk Factors:Hypertension.  Sonographer:    Kandice Robinsons RDCS Referring Phys: [id number] [place]  Sonographer Comments: Patient is morbidly obese. Image acquisition challenging due to patient body habitus and Image acquisition challenging due to respiratory motion. IMPRESSIONS  1. Left ventricular ejection fraction, by estimation, is 55 to 60%. The left ventricle has  normal function. The left ventricle has no regional wall motion abnormalities. Left ventricular diastolic parameters are indeterminate.  2. Right ventricular systolic function is low normal. The right ventricular size is normal.  3. The mitral valve is abnormal. No evidence of mitral valve regurgitation.  4. The aortic valve is tricuspid. Aortic valve regurgitation is not visualized. Mild to moderate aortic valve sclerosis/calcification is present, without any evidence of aortic stenosis.  5. The inferior vena cava is normal in size with greater than 50% respiratory variability, suggesting right atrial pressure of 3 mmHg. FINDINGS  Left Ventricle: Left ventricular ejection fraction, by estimation, is 55 to 60%. The left ventricle has normal function. The left ventricle has no regional wall motion abnormalities. The left ventricular internal cavity size was normal in size. There is  no left ventricular hypertrophy. Left ventricular diastolic parameters are indeterminate. Right Ventricle: The right ventricular size is normal. Right vetricular wall thickness was not assessed. Right ventricular systolic function is low normal. Left Atrium: Left atrial size was normal in size. Right Atrium: Right atrial size was normal in size. Pericardium: There is no evidence of pericardial effusion. Mitral Valve: The mitral valve is abnormal. There is mild thickening of the mitral valve leaflet(s). Mild mitral annular calcification. No evidence of mitral valve regurgitation. MV peak gradient, 4.7 mmHg. The mean mitral valve gradient is 2.0 mmHg. Tricuspid Valve: The tricuspid valve is normal in structure. Tricuspid valve regurgitation is not demonstrated. Aortic Valve: The aortic valve is tricuspid. Aortic valve regurgitation is not visualized. Mild to moderate aortic valve sclerosis/calcification is present, without any evidence of aortic stenosis. Pulmonic Valve: The pulmonic valve was not well visualized. Pulmonic valve  regurgitation is not visualized. Aorta: The aortic root is normal in size and structure. Venous: The inferior vena cava is normal in size with greater than 50% respiratory variability, suggesting right atrial pressure of 3 mmHg. IAS/Shunts: The interatrial septum was not assessed.  LEFT VENTRICLE PLAX 2D LVIDd:         4.00 cm     Diastology LVIDs:         2.70 cm     LV e' medial:    4.90 cm/s LV PW:         0.90 cm     LV E/e' medial:  15.7 LV IVS:        0.90 cm     LV e' lateral:   5.22 cm/s LVOT diam:     1.80 cm     LV E/e' lateral: 14.8 LVOT Area:     2.54 cm  LV Volumes (MOD) LV vol d, MOD A2C: 29.3 ml LV vol d, MOD A4C: 34.8 ml LV vol s, MOD A2C: 13.5 ml LV vol s, MOD A4C: 18.3 ml LV SV MOD A2C:     15.8 ml LV SV MOD A4C:     34.8 ml LV SV MOD BP:      16.5 ml RIGHT VENTRICLE TAPSE (M-mode): 2.1 cm LEFT ATRIUM  Index LA diam:        2.30 cm 1.24 cm/m LA Vol (A2C):   8.8 ml  4.78 ml/m LA Vol (A4C):   48.1 ml 26.01 ml/m LA Biplane Vol: 22.0 ml 11.90 ml/m                        PULMONIC VALVE AORTA                 PV Vmax:       1.25 m/s Ao Root diam: 3.00 cm PV Vmean:      86.600 cm/s                       PV VTI:        0.190 m                       PV Peak grad:  6.2 mmHg                       PV Mean grad:  3.0 mmHg  MITRAL VALVE MV Area (PHT): 2.24 cm    SHUNTS MV Peak grad:  4.7 mmHg    Systemic Diam: 1.80 cm MV Mean grad:  2.0 mmHg MV Vmax:       1.08 m/s MV Vmean:      65.6 cm/s MV Decel Time: 338 msec MV E velocity: 77.10 cm/s MV A velocity: 87.50 cm/s MV E/A ratio:  0.88 Dorris Carnes MD Electronically signed by Dorris Carnes MD Signature Date/Time: 01/31/2021/5:43:22 PM    Final    [place] EKG SITE RITE  Result Date: 01/31/2021 If Site Rite image not attached, placement could not be confirmed due to current cardiac rhythm.    CT Abdomen Pelvis Wo Contrast  Result Date: 01/26/2021 CLINICAL DATA:  Unspecified abdominal pain, vomiting EXAM: CT ABDOMEN AND PELVIS WITHOUT CONTRAST TECHNIQUE:  Multidetector CT imaging of the abdomen and pelvis was performed following the standard protocol without IV contrast. COMPARISON:  01/06/2021 FINDINGS: Lower chest: Large hiatal hernia is present which appears fluid-filled. The visualized lung bases are clear. The visualized heart and pericardium are unremarkable. Hepatobiliary: Stable cyst within the left hepatic lobe. No definite solid intrahepatic mass identified on this noncontrast examination. Cholecystectomy has been performed. No intra or extrahepatic biliary ductal dilation. Pancreas: Unremarkable Spleen: Unremarkable Adrenals/Urinary Tract: The adrenal glands are unremarkable. The kidneys are normal in size and position. 6 mm nonobstructing calculus noted within the lower pole of the right [name]. The kidneys are otherwise unremarkable. The bladder is unremarkable. Stomach/Bowel: There is a high-grade proximal large bowel obstruction involving the mid ascending colon with the point transition best seen axial image # 39/2 and coronal image # 87/5. 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 An underlying intrinsic neoplasm, however, is not excluded on this examination. Proximally, the small bowel is dilated and fluid-filled. Distally, the large bowel is decompressed. There is moderate sigmoid diverticulosis. No free intraperitoneal gas or fluid. Vascular/Lymphatic: Moderate atherosclerotic calcification within the abdominal aorta. No aortic aneurysm. No pathologic adenopathy within the abdomen and pelvis. Reproductive: Status post hysterectomy. No adnexal masses. Other: Tiny fat containing umbilical hernia. The rectum is unremarkable. Musculoskeletal: No acute bone abnormality. No lytic or blastic bone lesion. IMPRESSION: High-grade proximal large bowel obstruction involving the mid ascending colon. Given the extensive inflammatory changes noted on prior examination, this may  represent a post  inflammatory stricture, however, correlation with endoscopy is recommended as an underlying neoplasm could appear similarly. Mild right nonobstructing nephrolithiasis. Large hiatal hernia Aortic Atherosclerosis (ICD10-I70.0). Electronically Signed   By: Fidela Salisbury MD   On: 01/26/2021 21:50   DG Abdomen 1 View  Result Date: 01/27/2021 CLINICAL DATA:  NG tube placement. EXAM: ABDOMEN - 1 VIEW COMPARISON:  Jan 26, 2021 FINDINGS: A nasogastric tube is seen with its distal end looped upon itself, overlying the medial aspect of the right lung base and adjacent portion of the thoracic spine. This is likely within the patient's known large gastric hernia. The bowel gas pattern is normal. Radiopaque surgical clips are seen overlying the right upper quadrant. No radio-opaque calculi or other significant radiographic abnormality are seen. IMPRESSION: Nasogastric tube positioning, as described above Electronically Signed   By: Virgina Norfolk M.D.   On: 01/27/2021 02:26   CT CHEST W CONTRAST  Result Date: 01/30/2021 CLINICAL DATA:  Colorectal cancer staging, obstructing ascending colon mass EXAM: CT CHEST WITH CONTRAST TECHNIQUE: Multidetector CT imaging of the chest was performed during intravenous contrast administration. CONTRAST:  76m OMNIPAQUE IOHEXOL 300 MG/ML  SOLN COMPARISON:  CT abdomen pelvis, 01/26/2021, CT chest, 08/11/2008 FINDINGS: Cardiovascular: No significant vascular findings. Normal heart size. No pericardial effusion. Mediastinum/Nodes: Prominent mediastinal lymph nodes, largest left hilar/AP window nodes measuring 1.8 x 1.2 cm (series 2, image 51). Moderate hiatal hernia with intrathoracic position of the gastric fundus. Status post thyroidectomy. Trachea and esophagus demonstrate no significant findings. Lungs/Pleura: There are multiple small bilateral pulmonary nodules, including a 5 mm nodule of the dependent right lower lobe (series 5, image 99), a 6 mm nodule of the posterior left upper  lobe (series 5, image 52), a 6 mm nodule of the central left lower lobe (series 5, image 91) and a 5 mm nodule of the dependent left lower lobe (series 5, image 93). No pleural effusion or pneumothorax. Upper Abdomen: No acute abnormality. Esophagogastric tube, tip positioned in the duodenal bulb. Musculoskeletal: No chest wall mass or suspicious bone lesions identified. IMPRESSION: 1. There are multiple small bilateral pulmonary nodules, measuring 6 mm and smaller, nonspecific although suspicious for pulmonary metastatic disease. These are new compared to very remote prior CT of the chest dated 08/11/2008 and likely below size threshold for confident PET-CT characterization. 2. Prominent mediastinal lymph nodes, similar in appearance to remote prior CT dated 08/11/2008 and likely benign and reactive. 3. Moderate hiatal hernia with intrathoracic position of the gastric fundus. 4. Esophagogastric tube, tip positioned in the duodenal bulb. Electronically Signed   By: AEddie CandleM.D.   On: 01/30/2021 20:44   CT ABDOMEN PELVIS W CONTRAST  Result Date: 01/07/2021 CLINICAL DATA:  Abdominal pain, nausea, diarrhea, bloating cramping. 40 pound weight loss over 6 months. EXAM: CT ABDOMEN AND PELVIS WITH CONTRAST TECHNIQUE: Multidetector CT imaging of the abdomen and pelvis was performed using the standard protocol following bolus administration of intravenous contrast. CONTRAST:  1077mISOVUE-300 IOPAMIDOL (ISOVUE-300) INJECTION 61% COMPARISON:  CT of the abdomen and pelvis without contrast on 06/19/2009 FINDINGS: Lower chest: No acute findings.  Stable large hiatal hernia. Hepatobiliary: The liver is unremarkable and demonstrates no masses. There is a small simple cyst in the inferior right lobe measuring approximately 9 mm. The gallbladder has been removed. Mild dilatation of the common bile duct up to approximately 10 mm in diameter is likely attributable to prior cholecystectomy. Pancreas: Unremarkable. No  pancreatic ductal dilatation or surrounding inflammatory changes.  Spleen: Normal in size without focal abnormality. Adrenals/Urinary Tract: Adrenal glands are unremarkable. Kidneys are normal, without renal calculi, focal lesion, or hydronephrosis. Bladder is unremarkable. Stomach/Bowel: Bowel demonstrates abnormal thickening and luminal narrowing involving the cecum and ascending colon up to roughly the level of the mid ascending colon. There also appears to be contiguous thickening in the terminal ileum. Some surrounding infiltrative appearance of the pericolonic fat is noted as well as some potential small lymph nodes measuring up to 6 mm. Differential considerations include carcinoma, inflammatory bowel disease or focal colitis also involving the terminal ileum. No evidence of associated small bowel obstruction, abscess or bowel perforation. Diverticulosis of the sigmoid colon present without evidence of acute diverticulitis. Vascular/Lymphatic: Atherosclerosis of the distal abdominal aorta without evidence of aneurysm. No enlarged retroperitoneal or iliac lymph nodes. Reproductive: Status post hysterectomy. No adnexal masses. Other: No abdominal wall hernia or abnormality. No abdominopelvic ascites. Musculoskeletal: No acute or significant osseous findings. IMPRESSION: 1. Abnormal thickening and luminal narrowing involving the cecum and ascending colon up to roughly the level of the mid ascending colon. There also appears to be contiguous thickening in the terminal ileum. Differential considerations include carcinoma, inflammatory bowel disease or focal colitis also involving the terminal ileum. No evidence of associated small bowel obstruction, abscess or bowel perforation. Correlation with evidence of blood in the stool is recommended as well as direct visualization with colonoscopy. 2. Stable large hiatal hernia. 3. Aortic atherosclerosis without evidence of aneurysm. Aortic Atherosclerosis (ICD10-I70.0).  Electronically Signed   By: Aletta Edouard M.D.   On: 01/07/2021 15:08   DG Chest Port 1 View  Result Date: 01/26/2021 CLINICAL DATA:  Onset abdominal pain and vomiting this morning. EXAM: PORTABLE CHEST 1 VIEW COMPARISON:  PA and lateral chest 04/08/2014. FINDINGS: Lungs clear. Heart size normal. Hiatal hernia again seen. No pneumothorax or pleural fluid. No acute or focal bony abnormality. IMPRESSION: No acute disease. Hiatal hernia. Electronically Signed   By: Inge Rise M.D.   On: 01/26/2021 20:00   DG Abd Portable 1 View  Result Date: 01/26/2021 CLINICAL DATA:  Onset abdominal pain and vomiting this morning. EXAM: PORTABLE ABDOMEN - 1 VIEW COMPARISON:  None. FINDINGS: Gas-filled loops of small bowel are dilated up to approximately 4.3 cm. There is some gas in the ascending colon. No unexpected abdominal calcification is seen. No acute bony abnormality. IMPRESSION: Dilated loops of small bowel with gas in the colon worrisome for early or partial small bowel obstruction. Electronically Signed   By: Inge Rise M.D.   On: 01/26/2021 20:02   DG INTRO LONG GI TUBE  Result Date: 01/27/2021 CLINICAL DATA:  Small-bowel obstruction. Feeding tube for decompression. EXAM: FL FEEDING TUBE PLACEMENT CONTRAST:  50 mL of Omnipaque 300 through the NG tube FLUOROSCOPY TIME:  Fluoroscopy Time:  3 minutes 12 second Radiation Exposure Index (if provided by the fluoroscopic device): Number of Acquired Spot Images: 2 COMPARISON:  CT abdomen pelvis 01/26/2021 FINDINGS: Review of the prior CT reveals a large hiatal hernia and small-bowel obstruction. NG tube passes through the right nares after lubrication and anesthesia with lidocaine lubricant. NG tube placed into the hiatal hernia. It was difficult to enter the stomach below the hernia. Addition of the Amplatz wire and injection of contrast eventually allowed the tube to pass into the stomach. Final tube position is in the antrum of the stomach. IMPRESSION:  Large hiatal hernia. Feeding tube placed with the tip in the antrum of the stomach. Electronically Signed   By:  Franchot Gallo M.D.   On: 01/27/2021 10:20   ECHOCARDIOGRAM COMPLETE  Result Date: 01/31/2021    ECHOCARDIOGRAM REPORT   Patient Name:   LARISHA VENCILL Bender Date of Exam: 01/31/2021 Medical Rec #:  [id number]     Height:       63.0 in Accession #:    [id number]    Weight:       180.0 lb Date of Birth:  08/15/1941     BSA:          1.849 m Patient Age:    49 years      BP:           127/62 mmHg Patient Gender: F             HR:           81 bpm. Exam Location:  Inpatient Procedure: 2D Echo, Color Doppler and Cardiac Doppler Indications:    Pre op eval  History:        Patient has no prior history of Echocardiogram examinations.                 COPD; Risk Factors:Hypertension.  Sonographer:    Kandice Robinsons RDCS Referring Phys: [id number] Little Ishikawa  Sonographer Comments: Patient is morbidly obese. Image acquisition challenging due to patient body habitus and Image acquisition challenging due to respiratory motion. IMPRESSIONS  1. Left ventricular ejection fraction, by estimation, is 55 to 60%. The left ventricle has normal function. The left ventricle has no regional wall motion abnormalities. Left ventricular diastolic parameters are indeterminate.  2. Right ventricular systolic function is low normal. The right ventricular size is normal.  3. The mitral valve is abnormal. No evidence of mitral valve regurgitation.  4. The aortic valve is tricuspid. Aortic valve regurgitation is not visualized. Mild to moderate aortic valve sclerosis/calcification is present, without any evidence of aortic stenosis.  5. The inferior vena cava is normal in size with greater than 50% respiratory variability, suggesting right atrial pressure of 3 mmHg. FINDINGS  Left Ventricle: Left ventricular ejection fraction, by estimation, is 55 to 60%. The left ventricle has normal function. The left ventricle has no regional wall  motion abnormalities. The left ventricular internal cavity size was normal in size. There is  no left ventricular hypertrophy. Left ventricular diastolic parameters are indeterminate. Right Ventricle: The right ventricular size is normal. Right vetricular wall thickness was not assessed. Right ventricular systolic function is low normal. Left Atrium: Left atrial size was normal in size. Right Atrium: Right atrial size was normal in size. Pericardium: There is no evidence of pericardial effusion. Mitral Valve: The mitral valve is abnormal. There is mild thickening of the mitral valve leaflet(s). Mild mitral annular calcification. No evidence of mitral valve regurgitation. MV peak gradient, 4.7 mmHg. The mean mitral valve gradient is 2.0 mmHg. Tricuspid Valve: The tricuspid valve is normal in structure. Tricuspid valve regurgitation is not demonstrated. Aortic Valve: The aortic valve is tricuspid. Aortic valve regurgitation is not visualized. Mild to moderate aortic valve sclerosis/calcification is present, without any evidence of aortic stenosis. Pulmonic Valve: The pulmonic valve was not well visualized. Pulmonic valve regurgitation is not visualized. Aorta: The aortic root is normal in size and structure. Venous: The inferior vena cava is normal in size with greater than 50% respiratory variability, suggesting right atrial pressure of 3 mmHg. IAS/Shunts: The interatrial septum was not assessed.  LEFT VENTRICLE PLAX 2D LVIDd:         4.00  cm     Diastology LVIDs:         2.70 cm     LV e' medial:    4.90 cm/s LV PW:         0.90 cm     LV E/e' medial:  15.7 LV IVS:        0.90 cm     LV e' lateral:   5.22 cm/s LVOT diam:     1.80 cm     LV E/e' lateral: 14.8 LVOT Area:     2.54 cm  LV Volumes (MOD) LV vol d, MOD A2C: 29.3 ml LV vol d, MOD A4C: 34.8 ml LV vol s, MOD A2C: 13.5 ml LV vol s, MOD A4C: 18.3 ml LV SV MOD A2C:     15.8 ml LV SV MOD A4C:     34.8 ml LV SV MOD BP:      16.5 ml RIGHT VENTRICLE TAPSE (M-mode):  2.1 cm LEFT ATRIUM             Index LA diam:        2.30 cm 1.24 cm/m LA Vol (A2C):   8.8 ml  4.78 ml/m LA Vol (A4C):   48.1 ml 26.01 ml/m LA Biplane Vol: 22.0 ml 11.90 ml/m                        PULMONIC VALVE AORTA                 PV Vmax:       1.25 m/s Ao Root diam: 3.00 cm PV Vmean:      86.600 cm/s                       PV VTI:        0.190 m                       PV Peak grad:  6.2 mmHg                       PV Mean grad:  3.0 mmHg  MITRAL VALVE MV Area (PHT): 2.24 cm    SHUNTS MV Peak grad:  4.7 mmHg    Systemic Diam: 1.80 cm MV Mean grad:  2.0 mmHg MV Vmax:       1.08 m/s MV Vmean:      65.6 cm/s MV Decel Time: 338 msec MV E velocity: 77.10 cm/s MV A velocity: 87.50 cm/s MV E/A ratio:  0.88 Dorris Carnes MD Electronically signed by Dorris Carnes MD Signature Date/Time: 01/31/2021/5:43:22 PM    Final    [place] EKG SITE RITE  Result Date: 01/31/2021 If Site Rite image not attached, placement could not be confirmed due to current cardiac rhythm.   Pathology:  SURGICAL PATHOLOGY  CASE: [id number]  PATIENT: Diannie Tesch  Surgical Pathology Report   Clinical History: large bowel obstruction   FINAL MICROSCOPIC DIAGNOSIS:   A. ASCENDING COLON BIOPSY:  - Moderately differentiated colorectal adenocarcinoma.  - See comment.   COMMENT:  Dr. Melina Copa agrees.   Assessment and Plan:  1.  Colon adenocarcinoma 2.  Chronic iron deficiency anemia 3.  Small pulmonary nodules noted on CT scan, too small to characterize 4.  COPD 5.  Hypothyroidism with remote history of thyroid cancer  -Discussed initial biopsy from colonoscopy with the patient.  Biopsy results consistent with adenocarcinoma.  We discussed  CT of the chest findings consistent with small pulmonary nodules.  These are too small to characterize.  We will continue to follow on future CT scans. Await surgical pathology report.  Discussed that staging and treatment options depend upon surgical pathology results. -The patient has been followed  by our office for chronic iron deficiency anemia.  She has received IV iron, vitamin B12, and blood transfusions in the past.  Her hemoglobin is normal at this time.  We will continue to monitor. -Continue Synthroid for hypothyroidism.  She will have ongoing management per primary care provider.  Thank you for this referral.   Mikey Bussing, DNP, AGPCNP-BC, AOCNP  Addendum I have seen the patient, examined her. I agree with the assessment and and plan and have edited the notes.   Ms Angelica [name] is a 80 yo female with PMH of IDA, HTN, but otherwise healthy and independent, presented with bowel obstruction.  Further work-up revealed a large ascending colon mass.  She had right hemicolectomy yesterday, surgical path still pending.  I have reviewed her staging CT scan, which showed indeterminate small nodules, no other evidence of metastasis.  No intra-abdominal metastasis was found during her surgery.  I discussed the role of adjuvant chemotherapy for node positive stage III colon cancer.  Due to her advanced age, limited social support, she would not be a candidate for FOLFOX or Prue ox.  If she does not have positive lymph nodes.

## 2021-02-04 DIAGNOSIS — R739 Hyperglycemia, unspecified: Secondary | ICD-10-CM | POA: Diagnosis not present

## 2021-02-04 DIAGNOSIS — J410 Simple chronic bronchitis: Secondary | ICD-10-CM | POA: Diagnosis not present

## 2021-02-04 DIAGNOSIS — K56609 Unspecified intestinal obstruction, unspecified as to partial versus complete obstruction: Secondary | ICD-10-CM | POA: Diagnosis not present

## 2021-02-04 DIAGNOSIS — E039 Hypothyroidism, unspecified: Secondary | ICD-10-CM | POA: Diagnosis not present

## 2021-02-04 LAB — BASIC METABOLIC PANEL
Anion gap: 3 — ABNORMAL LOW (ref 5–15)
BUN: 21 mg/dL (ref 8–23)
CO2: 32 mmol/L (ref 22–32)
Calcium: 8.5 mg/dL — ABNORMAL LOW (ref 8.9–10.3)
Chloride: 100 mmol/L (ref 98–111)
Creatinine, Ser: 0.63 mg/dL (ref 0.44–1.00)
GFR, Estimated: >60 mL/min (ref >60)
Glucose, Bld: 146 mg/dL — ABNORMAL HIGH (ref 70–99)
Potassium: 4.3 mmol/L (ref 3.5–5.1)
Sodium: 135 mmol/L (ref 135–145)

## 2021-02-04 LAB — GLUCOSE, CAPILLARY
Glucose-Capillary: 132 mg/dL — ABNORMAL HIGH (ref 70–99)
Glucose-Capillary: 137 mg/dL — ABNORMAL HIGH (ref 70–99)
Glucose-Capillary: 137 mg/dL — ABNORMAL HIGH (ref 70–99)
Glucose-Capillary: 151 mg/dL — ABNORMAL HIGH (ref 70–99)

## 2021-02-04 LAB — CBC
HCT: 34.3 % — ABNORMAL LOW (ref 36.0–46.0)
Hemoglobin: 11 g/dL — ABNORMAL LOW (ref 12.0–15.0)
MCH: 28.2 pg (ref 26.0–34.0)
MCHC: 32.1 g/dL (ref 30.0–36.0)
MCV: 87.9 fL (ref 80.0–100.0)
Platelets: 208 10*3/uL (ref 150–400)
RBC: 3.9 MIL/uL (ref 3.87–5.11)
RDW: 14.1 % (ref 11.5–15.5)
WBC: 10.9 10*3/uL — ABNORMAL HIGH (ref 4.0–10.5)
nRBC: 0 % (ref 0.0–0.2)

## 2021-02-04 LAB — PHOSPHORUS: Phosphorus: 4.4 mg/dL (ref 2.5–4.6)

## 2021-02-04 LAB — MAGNESIUM: Magnesium: 1.9 mg/dL (ref 1.7–2.4)

## 2021-02-04 MED ORDER — MAGNESIUM SULFATE IN D5W 1-5 GM/100ML-% IV SOLN
1.0000 g | Freq: Once | INTRAVENOUS | Status: AC
  Administered 2021-02-04: 1 g via INTRAVENOUS
  Filled 2021-02-04: qty 100

## 2021-02-04 MED ORDER — METHOCARBAMOL 1000 MG/10ML IJ SOLN
500.0000 mg | Freq: Three times a day (TID) | INTRAVENOUS | Status: DC
  Administered 2021-02-04 – 2021-02-08 (×12): 500 mg via INTRAVENOUS
  Filled 2021-02-04 (×4): qty 500
  Filled 2021-02-04: qty 5
  Filled 2021-02-04 (×3): qty 500
  Filled 2021-02-04: qty 5
  Filled 2021-02-04 (×5): qty 500

## 2021-02-04 MED ORDER — TRAVASOL 10 % IV SOLN
INTRAVENOUS | Status: AC
  Filled 2021-02-04: qty 960

## 2021-02-04 MED ORDER — DIPHENHYDRAMINE HCL 50 MG/ML IJ SOLN
12.5000 mg | Freq: Once | INTRAMUSCULAR | Status: AC
  Administered 2021-02-04: 12.5 mg via INTRAVENOUS
  Filled 2021-02-04: qty 1

## 2021-02-04 MED ORDER — DOXYLAMINE SUCCINATE (SLEEP) 25 MG PO TABS
25.0000 mg | ORAL_TABLET | Freq: Every evening | ORAL | Status: DC | PRN
  Filled 2021-02-04: qty 1

## 2021-02-04 NOTE — Progress Notes (Signed)
Progress Note  2 Days Post-Op  Subjective: Patient reports pain well controlled. Mobilized well with therapies yesterday. Did not sleep well overnight but has been able to rest some this AM. She has not passed flatus or had a BM yet.   Objective: Vital signs in last 24 hours: Temp:  [97.4 F (36.3 C)-99 F (37.2 C)] 98.4 F (36.9 C) (05/13 4496) Pulse Rate:  [72-76] 76 (05/13 0638) Resp:  [18-20] 18 (05/13 7591) BP: (107-128)/(60-70) 124/70 (05/13 6384) SpO2:  [93 %-98 %] 94 % (05/13 6659) Last BM Date: 02/02/21  Intake/Output from previous day: 05/12 0701 - 05/13 0700 In: 1553.5 [I.V.:1153.5; IV Piggyback:400] Out: 700 [Emesis/NG output:700] Intake/Output this shift: No intake/output data recorded.  PE: General: pleasant, WD, elderly female who is laying in bed in NAD Heart: regular, rate, and rhythm.   Lungs: CTAB, no wheezes, rhonchi, or rales noted.  Respiratory effort nonlabored Abd: soft, appropriately ttp, ND, incision with honeycomb dressing present, BS hypoactive, NGT with bilious drainage, mild ecchymosis around laparoscopic port sites   Lab Results:  Recent Labs    02/03/21 0358 02/04/21 0541  WBC 12.6* 10.9*  HGB 12.0 11.0*  HCT 37.2 34.3*  PLT 236 208   BMET Recent Labs    02/03/21 0358 02/04/21 0541  NA 135 135  K 5.0 4.3  CL 101 100  CO2 30 32  GLUCOSE 178* 146*  BUN 18 21  CREATININE 0.58 0.63  CALCIUM 8.9 8.5*   PT/INR No results for input(s): LABPROT, INR in the last 72 hours. CMP     Component Value Date/Time   NA 135 02/04/2021 0541   NA 141 11/10/2013 1449   K 4.3 02/04/2021 0541   K 3.7 11/10/2013 1449   CL 100 02/04/2021 0541   CO2 32 02/04/2021 0541   CO2 27 11/10/2013 1449   GLUCOSE 146 (H) 02/04/2021 0541   GLUCOSE 116 11/10/2013 1449   BUN 21 02/04/2021 0541   BUN 13.2 11/10/2013 1449   CREATININE 0.63 02/04/2021 0541   CREATININE 0.8 11/10/2013 1449   CALCIUM 8.5 (L) 02/04/2021 0541   CALCIUM 9.1 11/10/2013  1449   PROT 6.0 (L) 02/03/2021 0358   PROT 7.1 11/10/2013 1449   ALBUMIN 3.1 (L) 02/03/2021 0358   ALBUMIN 3.8 11/10/2013 1449   AST 17 02/03/2021 0358   AST 19 11/10/2013 1449   ALT 15 02/03/2021 0358   ALT 17 11/10/2013 1449   ALKPHOS 40 02/03/2021 0358   ALKPHOS 76 11/10/2013 1449   BILITOT 0.4 02/03/2021 0358   BILITOT 0.74 11/10/2013 1449   GFRNONAA >60 02/04/2021 0541   GFRAA >60 05/06/2020 1441   Lipase     Component Value Date/Time   LIPASE 35 01/26/2021 1830       Studies/Results: No results found.  Anti-infectives: Anti-infectives (From admission, onward)   Start     Dose/Rate Route Frequency Ordered Stop   02/02/21 1830  cefoTEtan (CEFOTAN) 2 g in sodium chloride 0.9 % 100 mL IVPB  Status:  Discontinued       Note to Pharmacy: Doesn't even recall if she is allergic to PCN   2 g 200 mL/hr over 30 Minutes Intravenous  Once 02/02/21 1742 02/02/21 1814   02/02/21 0600  ceFAZolin (ANCEF) 1 g in sodium chloride 0.9 % 100 mL IVPB  Status:  Discontinued       Note to Pharmacy: Patient states she doesn't even know if she is allergic to PCN   1 g 200  mL/hr over 30 Minutes Intravenous On call to O.R. 02/01/21 0277 02/01/21 1246   02/02/21 0600  cefoTEtan (CEFOTAN) 2 g in sodium chloride 0.9 % 100 mL IVPB       Note to Pharmacy: Doesn't even recall if she is allergic to PCN   2 g 200 mL/hr over 30 Minutes Intravenous On call to O.R. 02/01/21 1246 02/02/21 1345       Assessment/Plan H/O thyroid cancer HTN COPD, prn 2L O2 at home Iron deficiency anemia - likely secondary to below, hgb 11 this AM Severe protein calorie malnutrition - prealbumin 9.9 5/10, continue TPN  Partially obstructing ascending colon mass S/P laparoscopic right hemicolectomy 02/02/21 Dr. Barry Dienes  - POD#2 - continue IV tylenol and robaxin scheduled for pain control - PT/OT, continue to mobilize - mild ileus - trial clamping NGT today - continue TPN, will start to wean when tolerating FLD -  await return in bowel function  - surgical path pending   FEN - NPO, TPN, NGT clamping today  VTE - lovenox ID - [id number]-op  LOS: 9 days    Norm Parcel, [facility] Surgery 02/04/2021, 9:52 AM Please see Amion for pager number during day hours 7:00am-4:30pm

## 2021-02-04 NOTE — Care Management Important Message (Signed)
Important Message  Patient Details IM Letter given to the Patient. Name: Angelica [name] MRN: [id number] Date of Birth: Jan 09, 1941   Medicare Important Message Given:  Yes     Kerin Salen 02/04/2021, 1:23 PM

## 2021-02-04 NOTE — Progress Notes (Signed)
PROGRESS NOTE    FREIDA NEBEL  JHE:[id number] DOB: 1940-11-17 DOA: 01/26/2021 PCP: Velna Hatchet, MD    Chief Complaint  Patient presents with  . Emesis  . Abdominal Pain    Brief Narrative:  ARDENA GANGL a 80 y.o.femalewith medical history significant forremote hx of thyroid cancer, celiac disease, rectal bleed, COPD, HTN, iron deficiency anemia who presents withabdominal pain, bloatingand persistentnausea and vomiting. Patient recently had CT of the abdomen done4/14showing abnormal thickening and luminal narrowing of the cecum and ascending colon with differential including carcinoid, IBD or focal colitis. She had plan colonoscopy with GI in June however presented to ED, given worsening symptoms. In ED: CT showing high-grade proximal large bowel obstruction involving the mid ascending colon. Patient was initially hypotensive with tachycardic tachypneic. Stated her symptoms have largely resolved. Surgery Dr. Dema Severin has evaluated patient at bedside and recommended NG tube placement and GI consult.   Assessment & Plan:   Principal Problem:   Large bowel obstruction (HCC) Active Problems:   Hypokalemia   COPD (chronic obstructive pulmonary disease)- on nocturnal oxygen at home   Acquired hypothyroidism   Hypotension   Hyperglycemia   Adenocarcinoma of colon ([place])  1 large bowel obstruction secondary to moderately differentiated colorectal adenocarcinoma, POA -Patient had presented with ongoing abdominal pain, bloating, persistent nausea and vomiting.  Patient noted to have had a recent CT scan done on 414 that showed abnormal thickening and luminal narrowing of the cecum and ascending colon with differential including carcinoid, IBD or focal colitis. -CT abdomen and pelvis done did not show a high-grade proximal large bowel obstruction involving the mid ascending colon. -General surgery consulted who recommended NG tube placement, bowel rest, IV fluids, evaluation for GI  for potential endoscopic evaluation. -Patient was seen in consultation by GI and patient underwent colonoscopy with colonic biopsies revealing moderately differentiated colorectal adenocarcinoma. -Patient being followed by general surgery and patient s/p laparoscopic assisted partial colectomy 02/02/2021 with surgical pathology pending. -CT chest which was done with multiple small bilateral pulmonary nodules measuring 6 mm and smaller, nonspecific although suspicious for pulmonary metastatic disease. -CEA at 5.8 -Clamping trial today.  Awaiting on bowel function to return. -Mobilize -On TPN per pharmacy -Patient seen in consultation by oncology, Dr. Burr Medico. -General surgery following and appreciate input and recommendations.  2.  Hypokalemia -Repleted per pharmacy as patient on TPN.  Potassium at 4.3.   3.  Hyperglycemia -Hemoglobin A1c at 6. -CBG 132 this morning.   -SSI.   -On TPN.   4.  Hypotension -Felt secondary to dehydration, poor oral intake. -Improved with hydration.   -Supportive care.  5.  Hypothyroidism with remote history of thyroid cancer -Continue IV Synthroid.   -Once bowel function returns and patient tolerating diet we will transition back to home regimen oral Synthroid.   -Outpatient follow-up.   6.  COPD -As needed oxygen at night at baseline.   -Currently stable.   7.  SIRS criteria without sepsis; likely reactive secondary to above, POA/lactic acidosis due to profound dehydration -Patient presented with tachycardia, tachypnea, noted to have a leukocytosis but no clear source. -Resolved with supportive care, pain management. -Continue gentle hydration.   -Supportive care.    DVT prophylaxis: Lovenox. Code Status: Full Family Communication: Updated patient.  No family at bedside.  Disposition:   Status is: Inpatient    Dispo: The patient is from: Home              Anticipated d/c is to: TBD  Patient currently with large bowel  obstruction secondary to malignant tumor, status post laparoscopic right colectomy.  Awaiting bowel function to return.  Not stable for discharge.    Difficult to place patient no       Consultants:   General surgery: Dr. Dema Severin 01/27/2021  Gastroenterology: Dr. Therisa Doyne 01/28/2021  Oncology: Dr. Burr Medico 02/03/2021  Procedures:   CT chest 01/30/2021  CT abdomen and pelvis 01/26/2021  Abdominal films 01/27/2021, 01/26/2021  Chest x-ray 01/26/2021  NG tube/feeding placement under fluoroscopy per IR 01/27/2021 Dr. Carlis Abbott  2D echo 01/31/2021  Colonoscopy with biopsy per Dr. Therisa Doyne, GI 01/29/2021  Laparoscopic right colectomy for obstructing ascending colon cancer per Dr. Barry Dienes 02/02/2021  Antimicrobials:   None   Subjective: Patient sitting up in chair.  Denies any nausea or vomiting.  States abdominal pain is improved.  No chest pain.  No shortness of breath.  No flatus.  No bowel movement.   Objective: Vitals:   02/03/21 0556 02/03/21 1228 02/03/21 1949 02/04/21 0638  BP: 129/73 107/69 128/60 124/70  Pulse: 84 75 72 76  Resp: 20 20 18 18   Temp: 98.4 F (36.9 C) (!) 97.4 F (36.3 C) 99 F (37.2 C) 98.4 F (36.9 C)  TempSrc: Oral Oral  Oral  SpO2: 98% 98% 93% 94%  Weight:      Height:        Intake/Output Summary (Last 24 hours) at 02/04/2021 1011 Last data filed at 02/04/2021 0501 Gross per 24 hour  Intake 1553.45 ml  Output 700 ml  Net 853.45 ml   Filed Weights   01/26/21 1838 02/01/21 1000  Weight: 81.6 kg 84.2 kg    Examination:  General exam: NAD. NGT clamped.  Respiratory system:  CTAB. No wheezes, no crackles, no rhonchi.  Normal respiratory effort.   Cardiovascular system: RRR no murmurs rubs or gallops.  No JVD.  No lower extremity edema.  Gastrointestinal system: Abdomen is soft, nondistended, nontender, hypoactive bowel sounds.  Honeycomb dressing intact.  No rebound.  No guarding.   Central nervous system: Alert and oriented. No focal neurological  deficits. Extremities: Symmetric 5 x 5 power. Skin: No rashes, lesions or ulcers Psychiatry: Judgement and insight appear normal. Mood & affect appropriate.     Data Reviewed: I have personally reviewed following labs and imaging studies  CBC: Recent Labs  Lab 01/30/21 0450 01/31/21 0439 02/01/21 0340 02/03/21 0358 02/04/21 0541  WBC 8.9 8.5 7.3 12.6* 10.9*  NEUTROABS  --   --  4.5 10.8*  --   HGB 12.4 13.2 13.2 12.0 11.0*  HCT 38.2 40.9 41.2 37.2 34.3*  MCV 87.0 86.7 87.8 87.3 87.9  PLT 227 220 253 236 947    Basic Metabolic Panel: Recent Labs  Lab 01/31/21 0439 02/01/21 0340 02/02/21 0320 02/03/21 0358 02/04/21 0541  NA 138 139 137 135 135  K 3.6 3.5 4.2 5.0 4.3  CL 102 102 102 101 100  CO2 29 31 27 30  32  GLUCOSE 106* 114* 111* 178* 146*  BUN 5* 8 13 18 21   CREATININE 0.63 0.62 0.44 0.58 0.63  CALCIUM 8.7* 8.7* 8.9 8.9 8.5*  MG 1.5* 2.1 1.8 1.8 1.9  PHOS 2.9 3.7 4.1 2.5 4.4    GFR: Estimated Creatinine Clearance: 56.7 mL/min (by C-G formula based on SCr of 0.63 mg/dL).  Liver Function Tests: Recent Labs  Lab 01/29/21 0450 01/30/21 0450 01/31/21 0439 02/01/21 0340 02/03/21 0358  AST 15 14* 17 15 17   ALT 14 11 14  12  15  ALKPHOS 45 46 47 45 40  BILITOT 1.3* 1.1 1.0 0.7 0.4  PROT 6.0* 6.4* 6.4* 6.6 6.0*  ALBUMIN 3.2* 3.4* 3.5 3.2* 3.1*    CBG: Recent Labs  Lab 02/03/21 0552 02/03/21 1235 02/03/21 1823 02/03/21 2334 02/04/21 0602  GLUCAP 176* 177* 165* 154* 132*     Recent Results (from the past 240 hour(s))  Resp Panel by RT-PCR (Flu A&B, Covid) Nasopharyngeal Swab     Status: None   Collection Time: 01/26/21 10:54 PM   Specimen: Nasopharyngeal Swab; Nasopharyngeal(NP) swabs in vial transport medium  Result Value Ref Range Status   SARS Coronavirus 2 by RT PCR NEGATIVE NEGATIVE Final    Comment: (NOTE) SARS-CoV-2 target nucleic acids are NOT DETECTED.  The SARS-CoV-2 RNA is generally detectable in upper respiratory specimens during  the acute phase of infection. The lowest concentration of SARS-CoV-2 viral copies this assay can detect is 138 copies/mL. A negative result does not preclude SARS-Cov-2 infection and should not be used as the sole basis for treatment or other patient management decisions. A negative result may occur with  improper specimen collection/handling, submission of specimen other than nasopharyngeal swab, presence of viral mutation(s) within the areas targeted by this assay, and inadequate number of viral copies(<138 copies/mL). A negative result must be combined with clinical observations, patient history, and epidemiological information. The expected result is Negative.  Fact Sheet for Patients:  EntrepreneurPulse.com.au  Fact Sheet for Healthcare Providers:  IncredibleEmployment.be  This test is no t yet approved or cleared by the [place] FDA and  has been authorized for detection and/or diagnosis of SARS-CoV-2 by FDA under an Emergency Use Authorization (EUA). This EUA will remain  in effect (meaning this test can be used) for the duration of the COVID-19 declaration under Section 564(b)(1) of the Act, 21 U.S.C.section 360bbb-3(b)(1), unless the authorization is terminated  or revoked sooner.       Influenza A by PCR NEGATIVE NEGATIVE Final   Influenza B by PCR NEGATIVE NEGATIVE Final    Comment: (NOTE) The Xpert Xpress SARS-CoV-2/FLU/RSV plus assay is intended as an aid in the diagnosis of influenza from Nasopharyngeal swab specimens and should not be used as a sole basis for treatment. Nasal washings and aspirates are unacceptable for Xpert Xpress SARS-CoV-2/FLU/RSV testing.  Fact Sheet for Patients: EntrepreneurPulse.com.au  Fact Sheet for Healthcare Providers: IncredibleEmployment.be  This test is not yet approved or cleared by the [place] FDA and has been authorized for detection and/or  diagnosis of SARS-CoV-2 by FDA under an Emergency Use Authorization (EUA). This EUA will remain in effect (meaning this test can be used) for the duration of the COVID-19 declaration under Section 564(b)(1) of the Act, 21 U.S.C. section 360bbb-3(b)(1), unless the authorization is terminated or revoked.  Performed at [facility], [place] [address] [place], [place] [address]          Radiology Studies: No results found.      Scheduled Meds: . sodium chloride   Intravenous Once  . Chlorhexidine Gluconate Cloth  6 each Topical Daily  . enoxaparin (LOVENOX) injection  40 mg Subcutaneous Q24H  . insulin aspart  0-9 Units Subcutaneous Q6H  . levothyroxine  62.5 mcg Intravenous Daily  . pantoprazole (PROTONIX) IV  40 mg Intravenous Daily   Continuous Infusions: . dextrose 5 % and 0.45% NaCl 10 mL/hr at 02/02/21 1804  . methocarbamol (ROBAXIN) IV 500 mg (02/04/21 0550)  . TPN ADULT (ION) 100 mL/hr at 02/03/21 1747  .  TPN ADULT (ION)       LOS: 9 days    Time spent: 35 minutes    Irine Seal, MD [facility]   To contact the attending provider between 7A-7P or the covering provider during after hours 7P-7A, please log into the web site [email or web address] and access using universal [place] password for that web site. If you do not have the password, please call the hospital operator.  02/04/2021, 10:11 AM

## 2021-02-04 NOTE — Evaluation (Signed)
Physical Therapy Evaluation Patient Details Name: Angelica [name] MRN: [id number] DOB: Oct 04, 1940 Today's Date: 02/04/2021   History of Present Illness  [name] is an 80 year old female admitted for large bowel obstruction. Previous outpatient colonoscopy in June but due to worsening symptoms, she presented to the emergency room.  Repeat CT abdomen/pelvis on admission showed high-grade proximal large bowel obstruction involving the mid ascending colon concerning for underlying neoplasm.  She underwent colonoscopy on 01/29/2021 which showed a likely malignant completely obstructing tumor in the distal ascending colon which was biopsied. 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  She had a CT of the chest performed 01/30/2021 which showed multiple small bilateral pulmonary nodules measuring 6 mm and smaller which are nonspecific but suspicious for pulmonary metastatic disease, prominent mediastinal lymph nodes which were similar in appearance to prior CT from November 2009 which are likely benign and reactive.  The patient was taken to the OR on 02/02/2021 for laparoscopic right colectomy due to her obstructing ascending colon cancer. PMH: remote history of thyroid cancer, celiac disease, COPD, hypertension, and iron deficiency anemia.    Clinical Impression  Pt admitted with above diagnosis. Pt pleasant, supv/min guard for ambulation using RW, limited by "weak" complaints, no knee buckling, foot drop or LOB noted. Pt ambulates to restroom to void bladder, able to complete pericare independently and wash hands at sink before returning to recliner. Pt independent at baseline using rollator or SPC, family and neighbors available if needed PRN. Pt currently with functional limitations due to the deficits listed below (see PT Problem List). Pt will benefit from skilled PT to increase their independence and safety with mobility to allow discharge to the venue listed below.        Follow Up Recommendations Home health PT    Equipment Recommendations  None recommended by PT    Recommendations for Other Services       Precautions / Restrictions Precautions Precautions: Other (comment);Fall Precaution Comments: Abdominal incision, NG tube Restrictions Weight Bearing Restrictions: No      Mobility  Bed Mobility Overal bed mobility: Needs Assistance Bed Mobility: Supine to Sit   Supine to sit: Supervision;HOB elevated  General bed mobility comments: increased time to come to sitting EOB with HOB elevated and use of bedrail    Transfers Overall transfer level: Needs assistance Equipment used: Rolling walker (2 wheeled) Transfers: Sit to/from Stand Sit to Stand: Supervision  General transfer comment: powers to stand with BUE assisting, good steadiness upon rising, denies dizziness or nausea  Ambulation/Gait Ambulation/Gait assistance: Min guard Gait Distance (Feet): 20 Feet (x2) Assistive device: Rolling walker (2 wheeled) Gait Pattern/deviations: Step-through pattern;Decreased stride length Gait velocity: decreased   General Gait Details: pt ambulates short distance out into hallway then back to recliner, then additional ambulation to toilet and back to recliner, able to turn with body inside RW, no LOB, denies nausea and dizziness  Stairs            Wheelchair Mobility    Modified Rankin (Stroke Patients Only)       Balance Overall balance assessment: Needs assistance Sitting-balance support: Feet supported Sitting balance-Leahy Scale: Good Sitting balance - Comments: seated EOB   Standing balance support: During functional activity;Bilateral upper extremity supported;Single extremity supported Standing balance-Leahy Scale: Poor Standing balance comment: reliant on UE support        Pertinent Vitals/Pain Pain Assessment: No/denies pain    Home Living Family/patient expects to be discharged to:: Private residence Living  Arrangements:  Alone Available Help at Discharge: Family;Friend(s);Neighbor;Available PRN/intermittently   Home Access: Stairs to enter   Entrance Stairs-Number of Steps: 2 Home Layout: One level Home Equipment: Walker - 4 wheels;Cane - single point;Grab bars - tub/shower;Grab bars - toilet      Prior Function Level of Independence: Independent with assistive device(s)  Comments: Pt reports independent with ADLs/IALDs, community ambulation using rollator or SPC, shower chair for bathing, family/neighbors able to assist with mail and groceries if needed.     Hand Dominance   Dominant Hand: Right    Extremity/Trunk Assessment   Upper Extremity Assessment Upper Extremity Assessment: Defer to OT evaluation    Lower Extremity Assessment Lower Extremity Assessment: Overall WFL for tasks assessed (AROM WNL, MMT 4/5 throughout, denies numbness/tingling)    Cervical / Trunk Assessment Cervical / Trunk Assessment: Normal  Communication   Communication: No difficulties  Cognition Arousal/Alertness: Awake/alert Behavior During Therapy: WFL for tasks assessed/performed Overall Cognitive Status: Within Functional Limits for tasks assessed     General Comments      Exercises     Assessment/Plan    PT Assessment Patient needs continued PT services  PT Problem List Decreased activity tolerance;Decreased balance;Cardiopulmonary status limiting activity;Obesity       PT Treatment Interventions DME instruction;Gait training;Functional mobility training;Therapeutic activities;Therapeutic exercise;Balance training;Stair training;Patient/family education    PT Goals (Current goals can be found in the Care Plan section)  Acute Rehab PT Goals Patient Stated Goal: return home PT Goal Formulation: With patient Time For Goal Achievement: 02/18/21 Potential to Achieve Goals: Good    Frequency Min 3X/week   Barriers to discharge        Co-evaluation               AM-PAC PT "6  Clicks" Mobility  Outcome Measure Help needed turning from your back to your side while in a flat bed without using bedrails?: A Little Help needed moving from lying on your back to sitting on the side of a flat bed without using bedrails?: A Little Help needed moving to and from a bed to a chair (including a wheelchair)?: A Little Help needed standing up from a chair using your arms (e.g., wheelchair or bedside chair)?: A Little Help needed to walk in hospital room?: A Little Help needed climbing 3-5 steps with a railing? : A Little 6 Click Score: 18    End of Session Equipment Utilized During Treatment: Gait belt Activity Tolerance: Patient tolerated treatment well Patient left: in chair;with call bell/phone within reach;with chair alarm set Nurse Communication: Mobility status PT Visit Diagnosis: Other abnormalities of gait and mobility (R26.89)    Time: 3710-6269 PT Time Calculation (min) (ACUTE ONLY): 19 min   Charges:   PT Evaluation $PT Eval Low Complexity: 1 Low           Tori Josephine Wooldridge PT, DPT 02/04/21, 10:48 AM

## 2021-02-04 NOTE — Progress Notes (Signed)
PHARMACY - TOTAL PARENTERAL NUTRITION CONSULT NOTE   Indication: bowel obstruction  Patient Measurements: Height: 5' 3"  (160 cm) Weight: 84.2 kg (185 lb 10 oz) IBW/kg (Calculated) : 52.4 TPN AdjBW (KG): 59.7 Body mass index is 32.88 kg/m. Usual Weight:   Assessment: Patient is an 80 y.o F presented to the ED on 5/4 with c/o abdominal pain and n/v.  Abdominal CT on 5/4 showed "high-grade proximal large bowel obstruction."  Patient was placed on NPO and NGT placed on 5/5.  Colonoscopy on 5/7 showed obstructing tumor in the distal ascending colon. Biopsies came back with "moderately differentiated colorectal adenocarcinoma". TPN started on 5/9 and she underwent right colectomy on 5/11.  Glucose / Insulin: on sSSI q6h (used 9 units since TPN rate increased to goal rate of 100 ml/hr yesterday ay 6p) - cbgs (goal <150): 132-165 Electrolytes: Na 135; K 4.3, Phos up 4.4, Mag 1.9; CorrCa wnl; CL and CO2 wnl Renal: scr <1 Hepatic: LFTs wnl - prealbumin 9.8 (5/9), 9.9 (5/10) - TG 110 Intake / Output; MIVF: D5 0.45NS at 10 ml/hr - I/O: +853 ml - NG output: 700 ml GI Imaging: - 5/4 abd CT: High-grade proximal large bowel obstruction involving the mid ascending colon GI Surgeries / Procedures:  - 5/5:  NGT placed - 5/7 colonoscopy: Likely malignant completely obstructing tumor in the distal ascending colon. Biopsied -5/11: laparoscopic partial right colectomy Central access: PICC placed on 5/9 TPN start date: 5/9   Nutritional Goals (per RD recommendation on 5/10): Kcal:  1900-2100 kcal Protein:  90-100 grams Fluid:  >/= 2 L/day  Goal TPN rate is 100 mL/hr (provides 96 g of protein and 2078 kcals per day)  - 5/13: clamping NGT  Current Nutrition:  - NPO - TPN   Plan:   Now:  - Magnesium sulfate 1gm x1  - Continue TPN rate at goal rate 100 mL/hr at 1800 - Electrolytes in TPN: increase Na to 38mq/L, increase K to 449m/L, Ca 77m18mL, Mg 5 mEq/L, and decrease to Phos 10 mmol/L.  Cl:Ac 1:1 - Add standard MVI and trace elements to TPN - continue Sensitive q6h SSI and adjust as needed  - continue  MIVF to 10  mL/hr - TPN labs on Mon/Thurs - Bmet, phos, mag on 5/14   AnhDia SitterharmD, BCPS 02/04/2021 7:19 AM

## 2021-02-05 DIAGNOSIS — E039 Hypothyroidism, unspecified: Secondary | ICD-10-CM | POA: Diagnosis not present

## 2021-02-05 DIAGNOSIS — K56609 Unspecified intestinal obstruction, unspecified as to partial versus complete obstruction: Secondary | ICD-10-CM | POA: Diagnosis not present

## 2021-02-05 DIAGNOSIS — J410 Simple chronic bronchitis: Secondary | ICD-10-CM | POA: Diagnosis not present

## 2021-02-05 DIAGNOSIS — C189 Malignant neoplasm of colon, unspecified: Secondary | ICD-10-CM | POA: Diagnosis not present

## 2021-02-05 LAB — BASIC METABOLIC PANEL
Anion gap: 8 (ref 5–15)
BUN: 20 mg/dL (ref 8–23)
CO2: 26 mmol/L (ref 22–32)
Calcium: 8.6 mg/dL — ABNORMAL LOW (ref 8.9–10.3)
Chloride: 100 mmol/L (ref 98–111)
Creatinine, Ser: 0.52 mg/dL (ref 0.44–1.00)
GFR, Estimated: >60 mL/min (ref >60)
Glucose, Bld: 134 mg/dL — ABNORMAL HIGH (ref 70–99)
Potassium: 4.4 mmol/L (ref 3.5–5.1)
Sodium: 134 mmol/L — ABNORMAL LOW (ref 135–145)

## 2021-02-05 LAB — CBC
HCT: 33.9 % — ABNORMAL LOW (ref 36.0–46.0)
Hemoglobin: 11 g/dL — ABNORMAL LOW (ref 12.0–15.0)
MCH: 28.7 pg (ref 26.0–34.0)
MCHC: 32.4 g/dL (ref 30.0–36.0)
MCV: 88.5 fL (ref 80.0–100.0)
Platelets: 238 10*3/uL (ref 150–400)
RBC: 3.83 MIL/uL — ABNORMAL LOW (ref 3.87–5.11)
RDW: 14 % (ref 11.5–15.5)
WBC: 11.1 10*3/uL — ABNORMAL HIGH (ref 4.0–10.5)
nRBC: 0 % (ref 0.0–0.2)

## 2021-02-05 LAB — GLUCOSE, CAPILLARY
Glucose-Capillary: 121 mg/dL — ABNORMAL HIGH (ref 70–99)
Glucose-Capillary: 139 mg/dL — ABNORMAL HIGH (ref 70–99)
Glucose-Capillary: 141 mg/dL — ABNORMAL HIGH (ref 70–99)
Glucose-Capillary: 177 mg/dL — ABNORMAL HIGH (ref 70–99)

## 2021-02-05 LAB — PHOSPHORUS: Phosphorus: 3.9 mg/dL (ref 2.5–4.6)

## 2021-02-05 LAB — MAGNESIUM: Magnesium: 2.1 mg/dL (ref 1.7–2.4)

## 2021-02-05 MED ORDER — TRAVASOL 10 % IV SOLN
INTRAVENOUS | Status: AC
  Filled 2021-02-05: qty 960

## 2021-02-05 NOTE — Progress Notes (Signed)
PROGRESS NOTE    Angelica [name]  TMY:[id number] DOB: [birth year] DOA: 01/26/2021 PCP: Velna Hatchet, MD    Chief Complaint  Patient presents with  . Emesis  . Abdominal Pain    Brief Narrative:  HANG AMMON a 80 y.o.femalewith medical history significant forremote hx of thyroid cancer, celiac disease, rectal bleed, COPD, HTN, iron deficiency anemia who presents withabdominal pain, bloatingand persistentnausea and vomiting. Patient recently had CT of the abdomen done4/14showing abnormal thickening and luminal narrowing of the cecum and ascending colon with differential including carcinoid, IBD or focal colitis. She had plan colonoscopy with GI in June however presented to ED, given worsening symptoms. In ED: CT showing high-grade proximal large bowel obstruction involving the mid ascending colon. Patient was initially hypotensive with tachycardic tachypneic. Stated her symptoms have largely resolved. Surgery Dr. Dema Severin has evaluated patient at bedside and recommended NG tube placement and GI consult.   Assessment & Plan:   Principal Problem:   Large bowel obstruction (HCC) Active Problems:   Hypokalemia   COPD (chronic obstructive pulmonary disease)- on nocturnal oxygen at home   Acquired hypothyroidism   Hypotension   Hyperglycemia   Adenocarcinoma of colon ([place])  1 large bowel obstruction secondary to moderately differentiated colorectal adenocarcinoma, POA -Patient had presented with ongoing abdominal pain, bloating, persistent nausea and vomiting.  Patient noted to have had a recent CT scan done on 414 that showed abnormal thickening and luminal narrowing of the cecum and ascending colon with differential including carcinoid, IBD or focal colitis. -CT abdomen and pelvis done did show a high-grade proximal large bowel obstruction involving the mid ascending colon. -General surgery consulted who recommended NG tube placement, bowel rest, IV fluids, evaluation for GI for  potential endoscopic evaluation. -Patient was seen in consultation by GI and patient underwent colonoscopy with colonic biopsies revealing moderately differentiated colorectal adenocarcinoma. -Patient being followed by general surgery and patient s/p laparoscopic assisted partial colectomy 02/02/2021 with surgical pathology pending. -CT chest which was done with multiple small bilateral pulmonary nodules measuring 6 mm and smaller, nonspecific although suspicious for pulmonary metastatic disease. -CEA at 5.8 -Patient tolerated clamping trial and NG tube has been discontinued.   -Awaiting bowel function to return.   -Trial of clears today per general surgery.  -Mobilize -On TPN per pharmacy -Patient seen in consultation by oncology, Dr. Burr Medico. -General surgery following and appreciate input and recommendations.  2.  Hypokalemia -Potassium of 4.4.  Patient on TPN.  Electrolyte replacement per pharmacy.   3.  Hyperglycemia -Hemoglobin A1c at 6. -CBG 141 this morning.   -On TPN  -SSI.  4.  Hypotension -Felt secondary to dehydration, poor oral intake. -Improved with hydration.   -Supportive care.  5.  Hypothyroidism with remote history of thyroid cancer -Continue IV Synthroid.   -Once bowel function returns and patient tolerating diet may be transition back to home dose oral Synthroid.   -Outpatient follow-up.  6.  COPD -As needed oxygen at night at baseline.   -Currently stable.   7.  SIRS criteria without sepsis; likely reactive secondary to above, POA/lactic acidosis due to profound dehydration -Patient presented with tachycardia, tachypnea, noted to have a leukocytosis but no clear source. -Resolved with supportive care, pain management.  -Supportive care.    DVT prophylaxis: Lovenox. Code Status: Full Family Communication: Updated patient.  No family at bedside.  Disposition:   Status is: Inpatient    Dispo: The patient is from: Home  Anticipated d/c is  to: TBD              Patient currently with large bowel obstruction secondary to malignant tumor, status post laparoscopic right colectomy.  Awaiting bowel function to return.  Not stable for discharge.    Difficult to place patient no       Consultants:   General surgery: Dr. Dema Severin 01/27/2021  Gastroenterology: Dr. Therisa Doyne 01/28/2021  Oncology: Dr. Burr Medico 02/03/2021  Procedures:   CT chest 01/30/2021  CT abdomen and pelvis 01/26/2021  Abdominal films 01/27/2021, 01/26/2021  Chest x-ray 01/26/2021  NG tube/feeding placement under fluoroscopy per IR 01/27/2021 Dr. Carlis Abbott  2D echo 01/31/2021  Colonoscopy with biopsy per Dr. Therisa Doyne, GI 01/29/2021  Laparoscopic right colectomy for obstructing ascending colon cancer per Dr. Barry Dienes 02/02/2021  Antimicrobials:   None   Subjective: Patient sleeping but arousable.  NG tube has been removed yesterday per patient.  No nausea or emesis.  Some occasional abdominal discomfort.  No flatus.  No bowel movement.    Objective: Vitals:   02/04/21 1313 02/04/21 2154 02/05/21 0417 02/05/21 0423  BP: 107/65 (!) 149/82  (!) 120/58  Pulse: 87 87  86  Resp: 18   18  Temp: 98.4 F (36.9 C) 98 F (36.7 C)  98.9 F (37.2 C)  TempSrc:  Oral  Oral  SpO2: 96% 98%  97%  Weight:   85.2 kg   Height:        Intake/Output Summary (Last 24 hours) at 02/05/2021 0948 Last data filed at 02/05/2021 0930 Gross per 24 hour  Intake 2747.28 ml  Output 1325 ml  Net 1422.28 ml   Filed Weights   01/26/21 1838 02/01/21 1000 02/05/21 0417  Weight: 81.6 kg 84.2 kg 85.2 kg    Examination:  General exam: : NAD Respiratory system: CTA B anterior lung fields.  No wheezes, no rhonchi.  Speaking in full sentences.  Normal respiratory effort. Cardiovascular system: Regular rate and rhythm no murmurs rubs or gallops.  No JVD.  No lower extremity edema.  Gastrointestinal system: Abdomen soft, some tenderness to palpation mid abdominal region around honeycomb's dressing site which  is C/D/I.  Hypoactive bowel sounds.  No rebound.  No guarding.  Central nervous system: Alert and oriented. No focal neurological deficits. Extremities: Symmetric 5 x 5 power. Skin: No rashes, lesions or ulcers Psychiatry: Judgement and insight appear normal. Mood & affect appropriate.  Data Reviewed: I have personally reviewed following labs and imaging studies  CBC: Recent Labs  Lab 01/31/21 0439 02/01/21 0340 02/03/21 0358 02/04/21 0541 02/05/21 0356  WBC 8.5 7.3 12.6* 10.9* 11.1*  NEUTROABS  --  4.5 10.8*  --   --   HGB 13.2 13.2 12.0 11.0* 11.0*  HCT 40.9 41.2 37.2 34.3* 33.9*  MCV 86.7 87.8 87.3 87.9 88.5  PLT 220 253 236 208 366    Basic Metabolic Panel: Recent Labs  Lab 02/01/21 0340 02/02/21 0320 02/03/21 0358 02/04/21 0541 02/05/21 0356  NA 139 137 135 135 134*  K 3.5 4.2 5.0 4.3 4.4  CL 102 102 101 100 100  CO2 31 27 30  32 26  GLUCOSE 114* 111* 178* 146* 134*  BUN 8 13 18 21 20   CREATININE 0.62 0.44 0.58 0.63 0.52  CALCIUM 8.7* 8.9 8.9 8.5* 8.6*  MG 2.1 1.8 1.8 1.9 2.1  PHOS 3.7 4.1 2.5 4.4 3.9    GFR: Estimated Creatinine Clearance: 57 mL/min (by C-G formula based on SCr of 0.52 mg/dL).  Liver  Function Tests: Recent Labs  Lab 01/30/21 0450 01/31/21 0439 02/01/21 0340 02/03/21 0358  AST 14* 17 15 17   ALT 11 14 12 15   ALKPHOS 46 47 45 40  BILITOT 1.1 1.0 0.7 0.4  PROT 6.4* 6.4* 6.6 6.0*  ALBUMIN 3.4* 3.5 3.2* 3.1*    CBG: Recent Labs  Lab 02/04/21 0602 02/04/21 1150 02/04/21 1723 02/04/21 2351 02/05/21 0519  GLUCAP 132* 137* 151* 137* 141*     Recent Results (from the past 240 hour(s))  Resp Panel by RT-PCR (Flu A&B, Covid) Nasopharyngeal Swab     Status: None   Collection Time: 01/26/21 10:54 PM   Specimen: Nasopharyngeal Swab; Nasopharyngeal(NP) swabs in vial transport medium  Result Value Ref Range Status   SARS Coronavirus 2 by RT PCR NEGATIVE NEGATIVE Final    Comment: (NOTE) SARS-CoV-2 target nucleic acids are NOT  DETECTED.  The SARS-CoV-2 RNA is generally detectable in upper respiratory specimens during the acute phase of infection. The lowest concentration of SARS-CoV-2 viral copies this assay can detect is 138 copies/mL. A negative result does not preclude SARS-Cov-2 infection and should not be used as the sole basis for treatment or other patient management decisions. A negative result may occur with  improper specimen collection/handling, submission of specimen other than nasopharyngeal swab, presence of viral mutation(s) within the areas targeted by this assay, and inadequate number of viral copies(<138 copies/mL). A negative result must be combined with clinical observations, patient history, and epidemiological information. The expected result is Negative.  Fact Sheet for Patients:  EntrepreneurPulse.com.au  Fact Sheet for Healthcare Providers:  IncredibleEmployment.be  This test is no t yet approved or cleared by the [place] FDA and  has been authorized for detection and/or diagnosis of SARS-CoV-2 by FDA under an Emergency Use Authorization (EUA). This EUA will remain  in effect (meaning this test can be used) for the duration of the COVID-19 declaration under Section 564(b)(1) of the Act, 21 U.S.C.section 360bbb-3(b)(1), unless the authorization is terminated  or revoked sooner.       Influenza A by PCR NEGATIVE NEGATIVE Final   Influenza B by PCR NEGATIVE NEGATIVE Final    Comment: (NOTE) The Xpert Xpress SARS-CoV-2/FLU/RSV plus assay is intended as an aid in the diagnosis of influenza from Nasopharyngeal swab specimens and should not be used as a sole basis for treatment. Nasal washings and aspirates are unacceptable for Xpert Xpress SARS-CoV-2/FLU/RSV testing.  Fact Sheet for Patients: EntrepreneurPulse.com.au  Fact Sheet for Healthcare Providers: IncredibleEmployment.be  This test is not yet  approved or cleared by the [place] FDA and has been authorized for detection and/or diagnosis of SARS-CoV-2 by FDA under an Emergency Use Authorization (EUA). This EUA will remain in effect (meaning this test can be used) for the duration of the COVID-19 declaration under Section 564(b)(1) of the Act, 21 U.S.C. section 360bbb-3(b)(1), unless the authorization is terminated or revoked.  Performed at [facility], [place] [address] [place], [place] [address]          Radiology Studies: No results found.      Scheduled Meds: . sodium chloride   Intravenous Once  . Chlorhexidine Gluconate Cloth  6 each Topical Daily  . enoxaparin (LOVENOX) injection  40 mg Subcutaneous Q24H  . insulin aspart  0-9 Units Subcutaneous Q6H  . levothyroxine  62.5 mcg Intravenous Daily  . pantoprazole (PROTONIX) IV  40 mg Intravenous Daily   Continuous Infusions: . dextrose 5 % and 0.45% NaCl 10 mL/hr at  02/02/21 1804  . methocarbamol (ROBAXIN) IV 500 mg (02/05/21 0525)  . TPN ADULT (ION) 100 mL/hr at 02/04/21 1710  . TPN ADULT (ION)       LOS: 10 days    Time spent: 35 minutes    Irine Seal, MD [facility]   To contact the attending provider between 7A-7P or the covering provider during after hours 7P-7A, please log into the web site [email or web address] and access using universal [place] password for that web site. If you do not have the password, please call the hospital operator.  02/05/2021, 9:48 AM

## 2021-02-05 NOTE — Progress Notes (Signed)
PHARMACY - TOTAL PARENTERAL NUTRITION CONSULT NOTE   Indication: bowel obstruction  Patient Measurements: Height: 5' 3"  (160 cm) Weight: 85.2 kg (187 lb 14.4 oz) IBW/kg (Calculated) : 52.4 TPN AdjBW (KG): 59.7 Body mass index is 33.28 kg/m. Usual Weight:   Assessment: Patient is an 80 y.o F presented to the ED on 5/4 with c/o abdominal pain and n/v.  Abdominal CT on 5/4 showed "high-grade proximal large bowel obstruction."  Patient was placed on NPO and NGT placed on 5/5.  Colonoscopy on 5/7 showed obstructing tumor in the distal ascending colon. Biopsies came back with "moderately differentiated colorectal adenocarcinoma". TPN started on 5/9 and she underwent right colectomy on 5/11.  Glucose / Insulin: on sSSI q6h (used 6 units since TPN rate increased to goal rate of 100 ml/hr yesterday ay 6p) - cbgs (goal <150): 134-151 Electrolytes: Na down 134; CorrCa, CL, CO2 and other lytes wnl Renal: scr <1 Hepatic: LFTs wnl - prealbumin 9.8 (5/9), 9.9 (5/10) - TG 110 Intake / Output; MIVF: D5 0.45NS at 10 ml/hr - I/O: +1722 ml GI Imaging: - 5/4 abd CT: High-grade proximal large bowel obstruction involving the mid ascending colon GI Surgeries / Procedures:  - 5/5:  NGT placed - 5/7 colonoscopy: Likely malignant completely obstructing tumor in the distal ascending colon. Biopsied -5/11: laparoscopic partial right colectomy Central access: PICC placed on 5/9 TPN start date: 5/9   Nutritional Goals (per RD recommendation on 5/10): Kcal:  1900-2100 kcal Protein:  90-100 grams Fluid:  >/= 2 L/day  Goal TPN rate is 100 mL/hr (provides 96 g of protein and 2078 kcals per day)  - 5/13: clamping NGT - 5/14: clears  Current Nutrition:  - NPO - TPN   Plan:  - Continue TPN rate at goal rate 100 mL/hr at 1800 - Electrolytes in TPN: increase Na to 127mq/L, cont K at 473m/L, Ca 57m84mL, Mg 5 mEq/L, and continue Phos at 10 mmol/L. Cl:Ac 1:1 - Add standard MVI and trace elements to TPN -  continue Sensitive q6h SSI and adjust as needed  - continue  MIVF to 10  mL/hr - TPN labs on Mon/Thurs - Bmet, phos, mag on 5/15   AnhDia SitterharmD, BCPS 02/05/2021 8:11 AM

## 2021-02-05 NOTE — Progress Notes (Signed)
3 Days Post-Op   Subjective/Chief Complaint: Comfortable No flatus yet but no bloating or nausea    Objective: Vital signs in last 24 hours: Temp:  [98 F (36.7 C)-98.9 F (37.2 C)] 98.9 F (37.2 C) (05/14 0423) Pulse Rate:  [86-87] 86 (05/14 0423) Resp:  [18] 18 (05/14 0423) BP: (107-149)/(58-82) 120/58 (05/14 0423) SpO2:  [96 %-98 %] 97 % (05/14 0423) Weight:  [85.2 kg] 85.2 kg (05/14 0417) Last BM Date: 02/02/21  Intake/Output from previous day: 05/13 0701 - 05/14 0700 In: 2747.3 [I.V.:2597.3; IV Piggyback:150] Out: 1025 [Urine:1025] Intake/Output this shift: No intake/output data recorded.  Exam: Awake and alert, comfortable Abdomen soft, non-distended, incisions clean, good BS  Lab Results:  Recent Labs    02/04/21 0541 02/05/21 0356  WBC 10.9* 11.1*  HGB 11.0* 11.0*  HCT 34.3* 33.9*  PLT 208 238   BMET Recent Labs    02/04/21 0541 02/05/21 0356  NA 135 134*  K 4.3 4.4  CL 100 100  CO2 32 26  GLUCOSE 146* 134*  BUN 21 20  CREATININE 0.63 0.52  CALCIUM 8.5* 8.6*   PT/INR No results for input(s): LABPROT, INR in the last 72 hours. ABG No results for input(s): PHART, HCO3 in the last 72 hours.  Invalid input(s): PCO2, PO2  Studies/Results: No results found.  Anti-infectives: Anti-infectives (From admission, onward)   Start     Dose/Rate Route Frequency Ordered Stop   02/02/21 1830  cefoTEtan (CEFOTAN) 2 g in sodium chloride 0.9 % 100 mL IVPB  Status:  Discontinued       Note to Pharmacy: Doesn't even recall if she is allergic to PCN   2 g 200 mL/hr over 30 Minutes Intravenous  Once 02/02/21 1742 02/02/21 1814   02/02/21 0600  ceFAZolin (ANCEF) 1 g in sodium chloride 0.9 % 100 mL IVPB  Status:  Discontinued       Note to Pharmacy: Patient states she doesn't even know if she is allergic to PCN   1 g 200 mL/hr over 30 Minutes Intravenous On call to O.R. 02/01/21 1583 02/01/21 1246   02/02/21 0600  cefoTEtan (CEFOTAN) 2 g in sodium chloride  0.9 % 100 mL IVPB       Note to Pharmacy: Doesn't even recall if she is allergic to PCN   2 g 200 mL/hr over 30 Minutes Intravenous On call to O.R. 02/01/21 1246 02/02/21 1345      Assessment/Plan: H/O thyroid cancer HTN COPD, prn 2L O2 at home Iron deficiency anemia -likely secondary to below, hgb 11 this AM Severe protein calorie malnutrition - prealbumin 9.9 5/10, continue TPN  Partially obstructing ascending colon mass S/P laparoscopic right hemicolectomy 02/02/21 Dr. Barry Dienes  LOS: 10 days    POD#3 Will try clears today Continue TPN as outlined yesterday   Angelica [name] 02/05/2021

## 2021-02-06 DIAGNOSIS — J410 Simple chronic bronchitis: Secondary | ICD-10-CM | POA: Diagnosis not present

## 2021-02-06 DIAGNOSIS — E039 Hypothyroidism, unspecified: Secondary | ICD-10-CM | POA: Diagnosis not present

## 2021-02-06 DIAGNOSIS — C189 Malignant neoplasm of colon, unspecified: Secondary | ICD-10-CM | POA: Diagnosis not present

## 2021-02-06 DIAGNOSIS — K56609 Unspecified intestinal obstruction, unspecified as to partial versus complete obstruction: Secondary | ICD-10-CM | POA: Diagnosis not present

## 2021-02-06 LAB — MAGNESIUM: Magnesium: 1.8 mg/dL (ref 1.7–2.4)

## 2021-02-06 LAB — GLUCOSE, CAPILLARY
Glucose-Capillary: 155 mg/dL — ABNORMAL HIGH (ref 70–99)
Glucose-Capillary: 143 mg/dL — ABNORMAL HIGH (ref 70–99)
Glucose-Capillary: 135 mg/dL — ABNORMAL HIGH (ref 70–99)

## 2021-02-06 LAB — BASIC METABOLIC PANEL
Anion gap: 6 (ref 5–15)
BUN: 22 mg/dL (ref 8–23)
CO2: 27 mmol/L (ref 22–32)
Calcium: 8.3 mg/dL — ABNORMAL LOW (ref 8.9–10.3)
Chloride: 101 mmol/L (ref 98–111)
Creatinine, Ser: 0.55 mg/dL (ref 0.44–1.00)
GFR, Estimated: >60 mL/min (ref >60)
Glucose, Bld: 143 mg/dL — ABNORMAL HIGH (ref 70–99)
Potassium: 4.3 mmol/L (ref 3.5–5.1)
Sodium: 134 mmol/L — ABNORMAL LOW (ref 135–145)

## 2021-02-06 LAB — PHOSPHORUS: Phosphorus: 4.1 mg/dL (ref 2.5–4.6)

## 2021-02-06 MED ORDER — M.V.I. ADULT IV INJ
INJECTION | INTRAVENOUS | Status: AC
Start: 2021-02-06 — End: 2021-02-07
  Filled 2021-02-06: qty 480

## 2021-02-06 MED ORDER — MAGNESIUM SULFATE 2 GM/50ML IV SOLN
2.0000 g | Freq: Once | INTRAVENOUS | Status: AC
  Administered 2021-02-06: 2 g via INTRAVENOUS
  Filled 2021-02-06: qty 50

## 2021-02-06 NOTE — Progress Notes (Signed)
PROGRESS NOTE    Angelica [name]  BMW:[id number] DOB: [birth year] DOA: 01/26/2021 PCP: Velna Hatchet, MD    Chief Complaint  Patient presents with  . Emesis  . Abdominal Pain    Brief Narrative:  Angelica [name] a 80 y.o.femalewith medical history significant forremote hx of thyroid cancer, celiac disease, rectal bleed, COPD, HTN, iron deficiency anemia who presents withabdominal pain, bloatingand persistentnausea and vomiting. Patient recently had CT of the abdomen done4/14showing abnormal thickening and luminal narrowing of the cecum and ascending colon with differential including carcinoid, IBD or focal colitis. She had plan colonoscopy with GI in June however presented to ED, given worsening symptoms. In ED: CT showing high-grade proximal large bowel obstruction involving the mid ascending colon. Patient was initially hypotensive with tachycardic tachypneic. Stated her symptoms have largely resolved. Surgery Dr. Dema Severin has evaluated patient at bedside and recommended NG tube placement and GI consult.   Assessment & Plan:   Principal Problem:   Large bowel obstruction (HCC) Active Problems:   Hypokalemia   COPD (chronic obstructive pulmonary disease)- on nocturnal oxygen at home   Acquired hypothyroidism   Hypotension   Hyperglycemia   Adenocarcinoma of colon ([place])  1 large bowel obstruction secondary to moderately differentiated colorectal adenocarcinoma, POA -Patient had presented with ongoing abdominal pain, bloating, persistent nausea and vomiting.  Patient noted to have had a recent CT scan done on 414 that showed abnormal thickening and luminal narrowing of the cecum and ascending colon with differential including carcinoid, IBD or focal colitis. -CT abdomen and pelvis done did show a high-grade proximal large bowel obstruction involving the mid ascending colon. -General surgery consulted who recommended NG tube placement, bowel rest, IV fluids, evaluation for GI for  potential endoscopic evaluation. -Patient was seen in consultation by GI and patient underwent colonoscopy with colonic biopsies revealing moderately differentiated colorectal adenocarcinoma. -Patient being followed by general surgery and patient s/p laparoscopic assisted partial colectomy 02/02/2021 with surgical pathology pending. -CT chest which was done with multiple small bilateral pulmonary nodules measuring 6 mm and smaller, nonspecific although suspicious for pulmonary metastatic disease. -CEA at 5.8 -Patient tolerated clamping trial and NG tube has been discontinued.   -Tolerated clears, having bowel movements.   -Patient states diet has been advanced to a soft diet per general surgery.   -On TPN per pharmacy.   -Mobilization.   -Patient seen in consultation by oncology, Dr. Burr Medico.   -General surgery following and appreciate input and recommendations.   2.  Hypokalemia -Potassium of 4.3.  Magnesium at 1.8.  On TPN.  Magnesium sulfate 2 g IV x1.  Electrolyte replacement per pharmacy.   3.  Hyperglycemia -Hemoglobin A1c at 6. -CBG 155 this morning.   -Currently on TPN.   -SSI.    4.  Hypotension -Felt secondary to dehydration, poor oral intake. -Improved with hydration.   -Supportive care.  5.  Hypothyroidism with remote history of thyroid cancer -IV Synthroid.   -Once tolerating diet adequately we will transition back to home regimen oral Synthroid.   -Outpatient follow-up.   6.  COPD -As needed oxygen at night at baseline.   -Currently stable.   7.  SIRS criteria without sepsis; likely reactive secondary to above, POA/lactic acidosis due to profound dehydration -Patient presented with tachycardia, tachypnea, noted to have a leukocytosis but no clear source. -Resolved with supportive care, pain management.  -Supportive care.    DVT prophylaxis: Lovenox. Code Status: Full Family Communication: Updated patient.  No family at bedside.  Disposition:   Status is:  Inpatient    Dispo: The patient is from: Home              Anticipated d/c is to: TBD              Patient currently with large bowel obstruction secondary to malignant tumor, status post laparoscopic right colectomy.  Bowel function returning, diet being advanced.  Not stable for discharge.    Difficult to place patient no       Consultants:   General surgery: Dr. Dema Severin 01/27/2021  Gastroenterology: Dr. Therisa Doyne 01/28/2021  Oncology: Dr. Burr Medico 02/03/2021  Procedures:   CT chest 01/30/2021  CT abdomen and pelvis 01/26/2021  Abdominal films 01/27/2021, 01/26/2021  Chest x-ray 01/26/2021  NG tube/feeding placement under fluoroscopy per IR 01/27/2021 Dr. Carlis Abbott  2D echo 01/31/2021  Colonoscopy with biopsy per Dr. Therisa Doyne, GI 01/29/2021  Laparoscopic right colectomy for obstructing ascending colon cancer per Dr. Barry Dienes 02/02/2021  Antimicrobials:   None   Subjective: Patient states had BM last night and this morning, and passing flatus. Some abd discomfort just prior to BM o/w no significant abd pain. No CP.  No SOB.  Tolerated clears per patient.  Patient states diet advanced to a soft diet.  Objective: Vitals:   02/05/21 0423 02/05/21 1456 02/05/21 2044 02/06/21 0544  BP: (!) 120/58 98/84 118/78 (!) 114/55  Pulse: 86 83 90 85  Resp: 18 17 18 19   Temp: 98.9 F (37.2 C) 98.1 F (36.7 C) 98.7 F (37.1 C) 99.1 F (37.3 C)  TempSrc: Oral Oral Oral Oral  SpO2: 97% 98% 98% 97%  Weight:      Height:        Intake/Output Summary (Last 24 hours) at 02/06/2021 1109 Last data filed at 02/06/2021 1106 Gross per 24 hour  Intake 4382.8 ml  Output 800 ml  Net 3582.8 ml   Filed Weights   01/26/21 1838 02/01/21 1000 02/05/21 0417  Weight: 81.6 kg 84.2 kg 85.2 kg    Examination:  General exam: : NAD Respiratory system: CTA B.  No wheezes, no rhonchi.  Speaking in full sentences.  Normal respiratory effort. Cardiovascular system: Regular rate and rhythm no murmurs rubs or gallops.  No  JVD.  No lower extremity edema.  Gastrointestinal system: Abdomen soft, some abdominal discomfort in the upper abdominal region around incision site.  Nondistended.  Positive bowel sounds.  No rebound.  No guarding.  Honeycomb dressing intact.   Central nervous system: Alert and oriented. No focal neurological deficits. Extremities: Symmetric 5 x 5 power. Skin: No rashes, lesions or ulcers Psychiatry: Judgement and insight appear normal. Mood & affect appropriate.  Data Reviewed: I have personally reviewed following labs and imaging studies  CBC: Recent Labs  Lab 01/31/21 0439 02/01/21 0340 02/03/21 0358 02/04/21 0541 02/05/21 0356  WBC 8.5 7.3 12.6* 10.9* 11.1*  NEUTROABS  --  4.5 10.8*  --   --   HGB 13.2 13.2 12.0 11.0* 11.0*  HCT 40.9 41.2 37.2 34.3* 33.9*  MCV 86.7 87.8 87.3 87.9 88.5  PLT 220 253 236 208 614    Basic Metabolic Panel: Recent Labs  Lab 02/02/21 0320 02/03/21 0358 02/04/21 0541 02/05/21 0356 02/06/21 0325  NA 137 135 135 134* 134*  K 4.2 5.0 4.3 4.4 4.3  CL 102 101 100 100 101  CO2 27 30 32 26 27  GLUCOSE 111* 178* 146* 134* 143*  BUN 13 18 21 20 22   CREATININE 0.44 0.58 0.63  0.52 0.55  CALCIUM 8.9 8.9 8.5* 8.6* 8.3*  MG 1.8 1.8 1.9 2.1 1.8  PHOS 4.1 2.5 4.4 3.9 4.1    GFR: Estimated Creatinine Clearance: 57 mL/min (by C-G formula based on SCr of 0.55 mg/dL).  Liver Function Tests: Recent Labs  Lab 01/31/21 0439 02/01/21 0340 02/03/21 0358  AST 17 15 17   ALT 14 12 15   ALKPHOS 47 45 40  BILITOT 1.0 0.7 0.4  PROT 6.4* 6.6 6.0*  ALBUMIN 3.5 3.2* 3.1*    CBG: Recent Labs  Lab 02/05/21 0519 02/05/21 1119 02/05/21 1656 02/05/21 2341 02/06/21 0647  GLUCAP 141* 177* 139* 121* 155*     No results found for this or any previous visit (from the past 240 hour(s)).       Radiology Studies: No results found.      Scheduled Meds: . sodium chloride   Intravenous Once  . Chlorhexidine Gluconate Cloth  6 each Topical Daily  .  enoxaparin (LOVENOX) injection  40 mg Subcutaneous Q24H  . insulin aspart  0-9 Units Subcutaneous Q6H  . levothyroxine  62.5 mcg Intravenous Daily  . pantoprazole (PROTONIX) IV  40 mg Intravenous Daily   Continuous Infusions: . dextrose 5 % and 0.45% NaCl 10 mL/hr at 02/06/21 0931  . methocarbamol (ROBAXIN) IV 500 mg (02/06/21 0641)  . TPN ADULT (ION) 100 mL/hr at 02/05/21 1747  . TPN ADULT (ION)       LOS: 11 days    Time spent: 35 minutes    Irine Seal, MD [facility]   To contact the attending provider between 7A-7P or the covering provider during after hours 7P-7A, please log into the web site [email or web address] and access using universal [place] password for that web site. If you do not have the password, please call the hospital operator.  02/06/2021, 11:09 AM

## 2021-02-06 NOTE — Progress Notes (Signed)
Occupational Therapy Treatment Patient Details Name: Angelica [name] MRN: [id number] DOB: 1941/02/23 Today's Date: 02/06/2021    History of present illness Angelica [name] is an 80 year old female admitted for large bowel obstruction. Previous outpatient colonoscopy in June but due to worsening symptoms, she presented to the emergency room.  Repeat CT abdomen/pelvis on admission showed high-grade proximal large bowel obstruction involving the mid ascending colon concerning for underlying neoplasm.  She underwent colonoscopy on 01/29/2021 which showed a likely malignant completely obstructing tumor in the distal ascending colon which was biopsied. 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  She had a CT of the chest performed 01/30/2021 which showed multiple small bilateral pulmonary nodules measuring 6 mm and smaller which are nonspecific but suspicious for pulmonary metastatic disease, prominent mediastinal lymph nodes which were similar in appearance to prior CT from November 2009 which are likely benign and reactive.  The patient was taken to the OR on 02/02/2021 for laparoscopic right colectomy due to her obstructing ascending colon cancer. PMH: remote history of thyroid cancer, celiac disease, COPD, hypertension, and iron deficiency anemia.   OT comments  Patient with a limited session today due to frequent bowel/gas needs, so unable to show much progress with exception of performing 2 reps of supine<>sit with supervision only as well as 3 reps of EOB<>BSC with supervision only without use of AD.   Patient remains limited by general post-op discomfort and fatigue, along with deficits noted below. Angelica [name] continues to demonstrate good rehab potential and would benefit from continued skilled OT to increase safety and independence with ADLs and functional transfers to allow Angelica [name] to return home safely and reduce caregiver burden and fall risk.  Angelica [name] with questions and voiced concerns regarding  caring for herself at home. Angelica [name] reports that she has no assistance available and now feels strongly that she needs SNF level rehab prior to return home.  Discharge recommendations updated.    Follow Up Recommendations  SNF    Equipment Recommendations  None recommended by OT    Recommendations for Other Services      Precautions / Restrictions Precautions Precautions: Other (comment);Fall Precaution Comments: Abdominal incision, NG tube Restrictions Weight Bearing Restrictions: No       Mobility Bed Mobility Overal bed mobility: Needs Assistance Bed Mobility: Supine to Sit     Supine to sit: Supervision;Min guard Sit to supine: Supervision   General bed mobility comments: Angelica [name] mobilized from supine to/from sitting x 3 reps due to frequent gas/bowel needs. Angelica [name] mobilized supine-->sit x2 with supervision./ 3rd time Angelica [name] required Min guard/HHA due to fatigue.    Transfers Overall transfer level: Needs assistance   Transfers: Stand Pivot Transfers;Squat Pivot Transfers Sit to Stand: Supervision         General transfer comment: x3 transfers EOB<>BSC with supervision. Angelica [name] holding bed rail and BSC handles as needed for support.    Balance Overall balance assessment: Needs assistance Sitting-balance support: Feet supported Sitting balance-Leahy Scale: Good     Standing balance support: During functional activity Standing balance-Leahy Scale: Fair                             ADL either performed or assessed with clinical judgement   ADL Overall ADL's : Needs assistance/impaired Eating/Feeding: Independent   Grooming: Set up;Sitting;Wash/dry hands Grooming Details (indicate cue type and reason): Angelica [name] reports that she did not feel up for  Toilet Transfer: Psychologist, forensic Details (indicate cue type and reason): Angelica [name] performed EOB<>BSC x 3 reps due to persistent gas and bowel movements.  BSC was adjusted to a lower position in the  front to allow Angelica [name] to have feet supported. Toileting- Clothing Manipulation and Hygiene: Set up;Supervision/safety Toileting - Clothing Manipulation Details (indicate cue type and reason): Angelica [name] able to perform clothing mangement and hygiene with setup and supervision.     Functional mobility during ADLs: Min guard;Supervision/safety       Vision Patient Visual Report: No change from baseline     Perception     Praxis      Cognition Arousal/Alertness: Awake/alert Behavior During Therapy: WFL for tasks assessed/performed Overall Cognitive Status: Within Functional Limits for tasks assessed                                          Exercises     Shoulder Instructions       General Comments      Pertinent Vitals/ Pain       Pain Assessment: No/denies pain  Home Living                                          Prior Functioning/Environment              Frequency  Min 2X/week        Progress Toward Goals  OT Goals(current goals can now be found in the care plan section)  Progress towards OT goals: Not progressing toward goals - comment  Acute Rehab OT Goals Patient Stated Goal: Would now like to go to SNF as long as insurance covers cost. Angelica [name] expressed concern re: affording rehab. Angelica [name] does not think that she can take care of IADLs/home/groceries on her own. OT Goal Formulation: With patient Time For Goal Achievement: 02/17/21 Potential to Achieve Goals: Good  Plan Discharge plan needs to be updated    Co-evaluation                 AM-PAC OT "6 Clicks" Daily Activity     Outcome Measure   Help from another person eating meals?: None Help from another person taking care of personal grooming?: A Little Help from another person toileting, which includes using toliet, bedpan, or urinal?: A Little Help from another person bathing (including washing, rinsing, drying)?: A Little Help from another person to put on and taking  off regular upper body clothing?: A Little Help from another person to put on and taking off regular lower body clothing?: A Little 6 Click Score: 19    End of Session    OT Visit Diagnosis: Pain   Activity Tolerance Patient tolerated treatment well;Patient limited by fatigue   Patient Left in bed;with call bell/phone within reach;with bed alarm set;with family/visitor present   Nurse Communication  (RN handed off to OT in Angelica [name] room.)        Time: 9030-0923 OT Time Calculation (min): 23 min  Charges: OT General Charges $OT Visit: 1 Visit OT Treatments $Self Care/Home Management : 8-22 mins $Therapeutic Activity: 8-22 mins  Anderson [place], [place] Office: [phone] 02/06/2021   Julien Girt 02/06/2021, 1:55 PM

## 2021-02-06 NOTE — Plan of Care (Addendum)
  Problem: Education: Goal: Knowledge of General Education information will improve Description: Including pain rating scale, medication(s)/side effects and non-pharmacologic comfort measures Outcome: Progressing   Problem: Activity: Goal: Risk for activity intolerance will decrease Outcome: Progressing   Problem: Elimination: Goal: Will not experience complications related to bowel motility Outcome: Progressing Goal: Will not experience complications related to urinary retention Outcome: Progressing   Problem: Pain Managment: Goal: General experience of comfort will improve Outcome: Progressing   Problem: Safety: Goal: Ability to remain free from injury will improve Outcome: Progressing   Problem: Skin Integrity: Goal: Risk for impaired skin integrity will decrease Outcome: Progressing

## 2021-02-06 NOTE — Progress Notes (Signed)
PHARMACY - TOTAL PARENTERAL NUTRITION CONSULT NOTE   Indication: bowel obstruction  Patient Measurements: Height: 5' 3"  (160 cm) Weight: 85.2 kg (187 lb 14.4 oz) IBW/kg (Calculated) : 52.4 TPN AdjBW (KG): 59.7 Body mass index is 33.28 kg/m. Usual Weight:   Assessment: Patient is an 80 y.o F presented to the ED on 5/4 with c/o abdominal pain and n/v.  Abdominal CT on 5/4 showed "high-grade proximal large bowel obstruction."  Patient was placed on NPO and NGT placed on 5/5.  Colonoscopy on 5/7 showed obstructing tumor in the distal ascending colon. Biopsies came back with "moderately differentiated colorectal adenocarcinoma". TPN started on 5/9 and she underwent right colectomy on 5/11.  Glucose / Insulin: on sSSI q6h (used 6 units in 24 hrs) - cbgs (goal <150): 121-177 Electrolytes: Na remains at 134 (with max Na conc in TPN), Mag 1.8; CorrCa, CL, CO2 and other lytes wnl Renal: scr <1 Hepatic: LFTs wnl - prealbumin 9.8 (5/9), 9.9 (5/10) - TG 110 Intake / Output; MIVF: D5 0.45NS at 10 ml/hr - I/O: +2832 ml - UOP: 975 ml GI Imaging: - 5/4 abd CT: High-grade proximal large bowel obstruction involving the mid ascending colon GI Surgeries / Procedures:  - 5/5:  NGT placed - 5/7 colonoscopy: Likely malignant completely obstructing tumor in the distal ascending colon. Biopsied -5/11: laparoscopic partial right colectomy Central access: PICC placed on 5/9 TPN start date: 5/9   Nutritional Goals (per RD recommendation on 5/10): Kcal:  1900-2100 kcal Protein:  90-100 grams Fluid:  >/= 2 L/day  Goal TPN rate is 100 mL/hr (provides 96 g of protein and 2078 kcals per day)  - 5/13: clamping NGT - 5/14: clears - 5/15: tolerated clears, adv diet today. Per Dr. Ninfa Linden, ok to reduce TPN rate by half  Current Nutrition:  - TPN   Plan:   Now:  - Magnesium sulfate 2gm IV x1 per MD  - Reduce TPN rate by half to 50 mL/hr at 1800 - Electrolytes in TPN: continue Na to 123mq/L, cont K  at 478m/L, Ca 29m40mL, Mg 5 mEq/L, and continue Phos at 10 mmol/L. Cl:Ac 1:1 - Add standard MVI and trace elements to TPN - continue Sensitive q6h SSI and adjust as needed  - continue  MIVF to 10  mL/hr - TPN labs on Mon/Thurs   AnhDia SitterharmD, BCPS 02/06/2021 8:18 AM

## 2021-02-06 NOTE — Progress Notes (Signed)
4 Days Post-Op   Subjective/Chief Complaint: Tolerated clears Having flatus and BM's   Objective: Vital signs in last 24 hours: Temp:  [98.1 F (36.7 C)-99.1 F (37.3 C)] 99.1 F (37.3 C) (05/15 0544) Pulse Rate:  [83-90] 85 (05/15 0544) Resp:  [17-19] 19 (05/15 0544) BP: (98-118)/(55-84) 114/55 (05/15 0544) SpO2:  [97 %-98 %] 97 % (05/15 0544) Last BM Date: 02/02/21  Intake/Output from previous day: 05/14 0701 - 05/15 0700 In: 3191.6 [P.O.:600; I.V.:2441.6; IV Piggyback:150] Out: 975 [Urine:975] Intake/Output this shift: No intake/output data recorded.  Exam:   Awake and alert Abdomen soft, obese, non-distended, minimally tender  Lab Results:  Recent Labs    02/04/21 0541 02/05/21 0356  WBC 10.9* 11.1*  HGB 11.0* 11.0*  HCT 34.3* 33.9*  PLT 208 238   BMET Recent Labs    02/05/21 0356 02/06/21 0325  NA 134* 134*  K 4.4 4.3  CL 100 101  CO2 26 27  GLUCOSE 134* 143*  BUN 20 22  CREATININE 0.52 0.55  CALCIUM 8.6* 8.3*   PT/INR No results for input(s): LABPROT, INR in the last 72 hours. ABG No results for input(s): PHART, HCO3 in the last 72 hours.  Invalid input(s): PCO2, PO2  Studies/Results: No results found.  Anti-infectives: Anti-infectives (From admission, onward)   Start     Dose/Rate Route Frequency Ordered Stop   02/02/21 1830  cefoTEtan (CEFOTAN) 2 g in sodium chloride 0.9 % 100 mL IVPB  Status:  Discontinued       Note to Pharmacy: Doesn't even recall if she is allergic to PCN   2 g 200 mL/hr over 30 Minutes Intravenous  Once 02/02/21 1742 02/02/21 1814   02/02/21 0600  ceFAZolin (ANCEF) 1 g in sodium chloride 0.9 % 100 mL IVPB  Status:  Discontinued       Note to Pharmacy: Patient states she doesn't even know if she is allergic to PCN   1 g 200 mL/hr over 30 Minutes Intravenous On call to O.R. 02/01/21 1025 02/01/21 1246   02/02/21 0600  cefoTEtan (CEFOTAN) 2 g in sodium chloride 0.9 % 100 mL IVPB       Note to Pharmacy: Doesn't even  recall if she is allergic to PCN   2 g 200 mL/hr over 30 Minutes Intravenous On call to O.R. 02/01/21 1246 02/02/21 1345      Assessment/Plan: Partially obstructing ascending colon mass S/P laparoscopic right hemicolectomy 02/02/21 Dr. Barry Dienes  LOS: 11 days   POD#4  Having bowel function Will advance diet  Coralie Keens 02/06/2021

## 2021-02-07 DIAGNOSIS — J410 Simple chronic bronchitis: Secondary | ICD-10-CM | POA: Diagnosis not present

## 2021-02-07 DIAGNOSIS — K56609 Unspecified intestinal obstruction, unspecified as to partial versus complete obstruction: Secondary | ICD-10-CM | POA: Diagnosis not present

## 2021-02-07 DIAGNOSIS — C189 Malignant neoplasm of colon, unspecified: Secondary | ICD-10-CM | POA: Diagnosis not present

## 2021-02-07 DIAGNOSIS — E039 Hypothyroidism, unspecified: Secondary | ICD-10-CM | POA: Diagnosis not present

## 2021-02-07 LAB — COMPREHENSIVE METABOLIC PANEL
ALT: 10 U/L (ref 0–44)
AST: 10 U/L — ABNORMAL LOW (ref 15–41)
Albumin: 2.5 g/dL — ABNORMAL LOW (ref 3.5–5.0)
Alkaline Phosphatase: 41 U/L (ref 38–126)
Anion gap: 6 (ref 5–15)
BUN: 19 mg/dL (ref 8–23)
CO2: 29 mmol/L (ref 22–32)
Calcium: 8.3 mg/dL — ABNORMAL LOW (ref 8.9–10.3)
Chloride: 101 mmol/L (ref 98–111)
Creatinine, Ser: 0.61 mg/dL (ref 0.44–1.00)
GFR, Estimated: >60 mL/min (ref >60)
Glucose, Bld: 109 mg/dL — ABNORMAL HIGH (ref 70–99)
Potassium: 4.1 mmol/L (ref 3.5–5.1)
Sodium: 136 mmol/L (ref 135–145)
Total Bilirubin: 0.5 mg/dL (ref 0.3–1.2)
Total Protein: 5.5 g/dL — ABNORMAL LOW (ref 6.5–8.1)

## 2021-02-07 LAB — CBC
HCT: 30.3 % — ABNORMAL LOW (ref 36.0–46.0)
Hemoglobin: 9.5 g/dL — ABNORMAL LOW (ref 12.0–15.0)
MCH: 28 pg (ref 26.0–34.0)
MCHC: 31.4 g/dL (ref 30.0–36.0)
MCV: 89.4 fL (ref 80.0–100.0)
Platelets: 293 10*3/uL (ref 150–400)
RBC: 3.39 MIL/uL — ABNORMAL LOW (ref 3.87–5.11)
RDW: 14.4 % (ref 11.5–15.5)
WBC: 10.5 10*3/uL (ref 4.0–10.5)
nRBC: 0 % (ref 0.0–0.2)

## 2021-02-07 LAB — DIFFERENTIAL
Abs Immature Granulocytes: 0.13 10*3/uL — ABNORMAL HIGH (ref 0.00–0.07)
Basophils Absolute: 0.1 10*3/uL (ref 0.0–0.1)
Basophils Relative: 1 %
Eosinophils Absolute: 0.4 10*3/uL (ref 0.0–0.5)
Eosinophils Relative: 4 %
Immature Granulocytes: 1 %
Lymphocytes Relative: 27 %
Lymphs Abs: 2.8 10*3/uL (ref 0.7–4.0)
Monocytes Absolute: 1.2 10*3/uL — ABNORMAL HIGH (ref 0.1–1.0)
Monocytes Relative: 12 %
Neutro Abs: 5.8 10*3/uL (ref 1.7–7.7)
Neutrophils Relative %: 55 %

## 2021-02-07 LAB — TRIGLYCERIDES: Triglycerides: 91 mg/dL (ref <150)

## 2021-02-07 LAB — GLUCOSE, CAPILLARY
Glucose-Capillary: 108 mg/dL — ABNORMAL HIGH (ref 70–99)
Glucose-Capillary: 113 mg/dL — ABNORMAL HIGH (ref 70–99)
Glucose-Capillary: 134 mg/dL — ABNORMAL HIGH (ref 70–99)
Glucose-Capillary: 143 mg/dL — ABNORMAL HIGH (ref 70–99)
Glucose-Capillary: 155 mg/dL — ABNORMAL HIGH (ref 70–99)

## 2021-02-07 LAB — PREALBUMIN: Prealbumin: 10.1 mg/dL — ABNORMAL LOW (ref 18–38)

## 2021-02-07 LAB — PHOSPHORUS: Phosphorus: 4.1 mg/dL (ref 2.5–4.6)

## 2021-02-07 LAB — MAGNESIUM: Magnesium: 2 mg/dL (ref 1.7–2.4)

## 2021-02-07 MED ORDER — MORPHINE SULFATE (PF) 2 MG/ML IV SOLN: 1.0000 mg | INTRAVENOUS | Status: DC | PRN

## 2021-02-07 MED ORDER — INSULIN ASPART 100 UNIT/ML IJ SOLN: 0.0000 [IU] | Freq: Every day | INTRAMUSCULAR | Status: DC

## 2021-02-07 MED ORDER — INSULIN ASPART 100 UNIT/ML IJ SOLN
0.0000 [IU] | Freq: Three times a day (TID) | INTRAMUSCULAR | Status: DC
  Administered 2021-02-07: 2 [IU] via SUBCUTANEOUS
  Administered 2021-02-08: 1 [IU] via SUBCUTANEOUS

## 2021-02-07 MED ORDER — ENSURE ENLIVE PO LIQD
237.0000 mL | Freq: Two times a day (BID) | ORAL | Status: DC
  Administered 2021-02-07 – 2021-02-08 (×2): 237 mL via ORAL

## 2021-02-07 MED ORDER — SODIUM CHLORIDE 0.9 % IV SOLN: INTRAVENOUS | Status: DC

## 2021-02-07 MED ORDER — TRAMADOL HCL 50 MG PO TABS: 50.0000 mg | ORAL_TABLET | Freq: Four times a day (QID) | ORAL | Status: DC | PRN

## 2021-02-07 MED ORDER — LEVOTHYROXINE SODIUM 25 MCG PO TABS
125.0000 ug | ORAL_TABLET | Freq: Every day | ORAL | Status: DC
  Administered 2021-02-08: 125 ug via ORAL
  Filled 2021-02-07: qty 1

## 2021-02-07 MED ORDER — ACETAMINOPHEN 325 MG PO TABS: 650.0000 mg | ORAL_TABLET | Freq: Four times a day (QID) | ORAL | Status: DC | PRN

## 2021-02-07 MED ORDER — POLYSACCHARIDE IRON COMPLEX 150 MG PO CAPS
150.0000 mg | ORAL_CAPSULE | Freq: Every day | ORAL | Status: DC
  Filled 2021-02-07: qty 1

## 2021-02-07 MED ORDER — SODIUM CHLORIDE 0.9 % IV SOLN
510.0000 mg | Freq: Once | INTRAVENOUS | Status: AC
  Administered 2021-02-07: 510 mg via INTRAVENOUS
  Filled 2021-02-07: qty 510

## 2021-02-07 NOTE — Plan of Care (Signed)

## 2021-02-07 NOTE — Progress Notes (Signed)
While rounding on the unit Chaplain and pt engaged eye contact so Chaplain stepped in for a visit.  Pt. reported feeling so much better after her two-week stay here.  A friend was in the room.  We enjoyed a brief chat and then Chaplain departed.

## 2021-02-07 NOTE — TOC Initial Note (Signed)
Transition of Care [facility]) - Initial/Assessment Note    Patient Details  Name: Angelica [name] MRN: [id number] Date of Birth: 1941/09/09  Transition of Care [facility]) CM/SW Contact:    Dessa Phi, RN Phone Number: 02/07/2021, 1:18 PM  Clinical Narrative:  Patient has no preference for [facility] agency.[place] rep [place]-accepted.Continue to monitor for d/c plans.                 Expected Discharge Plan: [place] Barriers to Discharge: Continued Medical Work up   Patient Goals and CMS Choice Patient states their goals for this hospitalization and ongoing recovery are:: go home CMS Medicare.gov Compare Post Acute Care list provided to:: Patient Choice offered to / list presented to : Patient  Expected Discharge Plan and Services Expected Discharge Plan: [place]   Discharge Planning Services: CM Consult Post Acute Care Choice: [place] arrangements for the past 2 months: Single Family Home                                      Prior Living Arrangements/Services Living arrangements for the past 2 months: Single Family Home Lives with:: Self Patient language and need for interpreter reviewed:: Yes Do you feel safe going back to the place where you live?: Yes      Need for Family Participation in Patient Care: No (Comment) Care giver support system in place?: Yes (comment) Current home services: DME (rw,shower chair) Criminal Activity/Legal Involvement Pertinent to Current Situation/Hospitalization: No - Comment as needed  Activities of Daily Living Home Assistive Devices/Equipment: Walker (specify type) ADL Screening (condition at time of admission) Patient's cognitive ability adequate to safely complete daily activities?: No Is the patient deaf or have difficulty hearing?: No Does the patient have difficulty seeing, even when wearing glasses/contacts?: No Does the patient have difficulty concentrating, remembering, or making  decisions?: No Patient able to express need for assistance with ADLs?: Yes Does the patient have difficulty dressing or bathing?: Yes Independently performs ADLs?: No Does the patient have difficulty walking or climbing stairs?: Yes Weakness of Legs: Both Weakness of Arms/Hands: None  Permission Sought/Granted Permission sought to share information with : Case Manager Permission granted to share information with : Yes, Verbal Permission Granted  Share Information with NAME: Case Manager           Emotional Assessment Appearance:: Appears stated age Attitude/Demeanor/Rapport: Gracious Affect (typically observed): Accepting Orientation: : Oriented to Self,Oriented to Place,Oriented to  Time,Oriented to Situation Alcohol / Substance Use: Not Applicable Psych Involvement: No (comment)  Admission diagnosis:  Large bowel obstruction [facility]) [K56.609] Patient Active Problem List   Diagnosis Date Noted  . Adenocarcinoma of colon ([place])   . Hypotension 01/27/2021  . Hyperglycemia 01/27/2021  . Large bowel obstruction ([place]) 01/26/2021  . Acquired hypothyroidism 12/09/2020  . Preventive measure 05/06/2020  . Rectal bleeding 10/23/2018  . Bilateral primary osteoarthritis of knee 08/08/2016  . Reactive thrombocytosis 08/24/2014  . Celiac disease 11/17/2013  . Other nonspecific abnormal cardiovascular system function study 09/27/2012  . Pulmonary HTN ([place]) 09/27/2012  . History of thyroid cancer- s/p total thyroidectomy 2012 07/04/2012  . Other fatigue 07/04/2012  . Iron deficiency anemia 07/04/2012  . COPD (chronic obstructive pulmonary disease)- on nocturnal oxygen at home 07/04/2012  . GERD (gastroesophageal reflux disease) 07/04/2012  . Chronic anemia- symptomatic 07/03/2012  . Hypokalemia 07/03/2012  . Essential hypertension  07/03/2012   PCP:  Velna Hatchet, MD Pharmacy:   [facility] ([place]), [place] - [address] [place] ([place]) [place] [address] Phone: [phone] Fax: [phone]  [id number] [place] [id number]: [phone]: [phone]    Social Determinants of Health (SDOH) Interventions    Readmission Risk Interventions No flowsheet data found.

## 2021-02-07 NOTE — Progress Notes (Addendum)
HEMATOLOGY-ONCOLOGY PROGRESS NOTE  SUBJECTIVE: The patient is feeling better today.  NG tube has been removed.  She is eating a soft diet and tolerating it well.  She denies nausea and vomiting.  Abdominal pain well controlled.  Bowels are moving.  REVIEW OF SYSTEMS:   Constitutional: Denies fevers, chills Eyes: Denies blurriness of vision Ears, nose, mouth, throat, and face: Denies mucositis or sore throat Respiratory: Denies cough, dyspnea or wheezes Cardiovascular: Denies palpitation, chest discomfort Gastrointestinal:  Denies nausea, heartburn or change in bowel habits Skin: Denies abnormal skin rashes Lymphatics: Denies new lymphadenopathy or easy bruising Neurological:Denies numbness, tingling or new weaknesses Behavioral/Psych: Mood is stable, no new changes  Extremities: No lower extremity edema All other systems were reviewed with the patient and are negative.  I have reviewed the past medical history, past surgical history, social history and family history with the patient and they are unchanged from previous note.   PHYSICAL EXAMINATION: ECOG PERFORMANCE STATUS: 2 - Symptomatic, <50% confined to bed  Vitals:   02/06/21 2015 02/07/21 0533  BP: 121/68 129/71  Pulse: 80 78  Resp: 18 20  Temp: 98.3 F (36.8 C) 98.4 F (36.9 C)  SpO2: 99% 100%   Filed Weights   01/26/21 1838 02/01/21 1000 02/05/21 0417  Weight: 81.6 kg 84.2 kg 85.2 kg    Intake/Output from previous day: 05/15 0701 - 05/16 0700 In: 1776.2 [P.O.:130; I.V.:1396.2; IV Piggyback:250] Out: 625 [Urine:625]  GENERAL:alert, no distress and comfortable SKIN: skin color, texture, turgor are normal, no rashes or significant lesions EYES: normal, Conjunctiva are pink and non-injected, sclera clear LUNGS: clear to auscultation and percussion with normal breathing effort HEART: regular rate & rhythm and no murmurs and no lower extremity edema ABDOMEN: Positive bowel sounds, soft, minimal tenderness with  palpation Musculoskeletal:no cyanosis of digits and no clubbing  NEURO: alert & oriented x 3 with fluent speech, no focal motor/sensory deficits  LABORATORY DATA:  I have reviewed the data as listed CMP Latest Ref Rng & Units 02/07/2021 02/06/2021 02/05/2021  Glucose 70 - 99 mg/dL 109(H) 143(H) 134(H)  BUN 8 - 23 mg/dL _0 Creatinine 0.44 - 1.00 mg/dL 0.61 0.55 0.52  Sodium 135 - 145 mmol/L 136 134(L) 134(L)  Potassium 3.5 - 5.1 mmol/L 4.1 4.3 4.4  Chloride 98 - 111 mmol/L 101 101 100  CO2 22 - 32 mmol/L _1 Calcium 8.9 - 10.3 mg/dL 8.3(L) 8.3(L) 8.6(L)  Total Protein 6.5 - 8.1 g/dL 5.5(L) - -  Total Bilirubin 0.3 - 1.2 mg/dL 0.5 - -  Alkaline Phos 38 - 126 U/L 41 - -  AST 15 - 41 U/L 10(L) - -  ALT 0 - 44 U/L 10 - -    Lab Results  Component Value Date   WBC 10.5 02/07/2021   HGB 9.5 (L) 02/07/2021   HCT 30.3 (L) 02/07/2021   MCV 89.4 02/07/2021   PLT 293 02/07/2021   NEUTROABS 5.8 02/07/2021    CT Abdomen Pelvis Wo Contrast  Result Date: 01/26/2021 CLINICAL DATA:  Unspecified abdominal pain, vomiting EXAM: CT ABDOMEN AND PELVIS WITHOUT CONTRAST TECHNIQUE: Multidetector CT imaging of the abdomen and pelvis was performed following the standard protocol without IV contrast. COMPARISON:  01/06/2021 FINDINGS: Lower chest: Large hiatal hernia is present which appears fluid-filled. The visualized lung bases are clear. The visualized heart and pericardium are unremarkable. Hepatobiliary: Stable cyst within the left hepatic lobe. No definite solid intrahepatic mass identified on this noncontrast examination. Cholecystectomy has  been performed. No intra or extrahepatic biliary ductal dilation. Pancreas: Unremarkable Spleen: Unremarkable Adrenals/Urinary Tract: The adrenal glands are unremarkable. The kidneys are normal in size and position. 6 mm nonobstructing calculus noted within the lower pole of the right kidney. The kidneys are otherwise unremarkable. The bladder is  unremarkable. Stomach/Bowel: There is a high-grade proximal large bowel obstruction involving the mid ascending colon with the point transition best seen axial image # 39/2 and coronal image # 87/5. 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 An underlying intrinsic neoplasm, however, is not excluded on this examination. Proximally, the small bowel is dilated and fluid-filled. Distally, the large bowel is decompressed. There is moderate sigmoid diverticulosis. No free intraperitoneal gas or fluid. Vascular/Lymphatic: Moderate atherosclerotic calcification within the abdominal aorta. No aortic aneurysm. No pathologic adenopathy within the abdomen and pelvis. Reproductive: Status post hysterectomy. No adnexal masses. Other: Tiny fat containing umbilical hernia. The rectum is unremarkable. Musculoskeletal: No acute bone abnormality. No lytic or blastic bone lesion. IMPRESSION: High-grade proximal large bowel obstruction involving the mid ascending colon. 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 Mild right nonobstructing nephrolithiasis. Large hiatal hernia Aortic Atherosclerosis (ICD10-I70.0). Electronically Signed   By: Fidela Salisbury MD   On: 01/26/2021 21:50   DG Abdomen 1 View  Result Date: 01/27/2021 CLINICAL DATA:  NG tube placement. EXAM: ABDOMEN - 1 VIEW COMPARISON:  Jan 26, 2021 FINDINGS: A nasogastric tube is seen with its distal end looped upon itself, overlying the medial aspect of the right lung base and adjacent portion of the thoracic spine. This is likely within the patient's known large gastric hernia. The bowel gas pattern is normal. Radiopaque surgical clips are seen overlying the right upper quadrant. No radio-opaque calculi or other significant radiographic  abnormality are seen. IMPRESSION: Nasogastric tube positioning, as described above Electronically Signed   By: Virgina Norfolk M.D.   On: 01/27/2021 02:26   CT CHEST W CONTRAST  Result Date: 01/30/2021 CLINICAL DATA:  Colorectal cancer staging, obstructing ascending colon mass EXAM: CT CHEST WITH CONTRAST TECHNIQUE: Multidetector CT imaging of the chest was performed during intravenous contrast administration. CONTRAST:  30m OMNIPAQUE IOHEXOL 300 MG/ML  SOLN COMPARISON:  CT abdomen pelvis, 01/26/2021, CT chest, 08/11/2008 FINDINGS: Cardiovascular: No significant vascular findings. Normal heart size. No pericardial effusion. Mediastinum/Nodes: Prominent mediastinal lymph nodes, largest left hilar/AP window nodes measuring 1.8 x 1.2 cm (series 2, image 51). Moderate hiatal hernia with intrathoracic position of the gastric fundus. Status post thyroidectomy. Trachea and esophagus demonstrate no significant findings. Lungs/Pleura: There are multiple small bilateral pulmonary nodules, including a 5 mm nodule of the dependent right lower lobe (series 5, image 99), a 6 mm nodule of the posterior left upper lobe (series 5, image 52), a 6 mm nodule of the central left lower lobe (series 5, image 91) and a 5 mm nodule of the dependent left lower lobe (series 5, image 93). No pleural effusion or pneumothorax. Upper Abdomen: No acute abnormality. Esophagogastric tube, tip positioned in the duodenal bulb. Musculoskeletal: No chest wall mass or suspicious bone lesions identified. IMPRESSION: 1. There are multiple small bilateral pulmonary nodules, measuring 6 mm and smaller, nonspecific although suspicious for pulmonary metastatic disease. These are new compared to very remote prior CT of the chest dated 08/11/2008 and likely below size threshold for confident PET-CT characterization.  2. Prominent mediastinal lymph nodes, similar in appearance to remote prior CT dated 08/11/2008 and likely benign and reactive. 3. Moderate  hiatal hernia with intrathoracic position of the gastric fundus. 4. Esophagogastric tube, tip positioned in the duodenal bulb. Electronically Signed   By: Eddie Candle M.D.   On: 01/30/2021 20:44   DG Chest Port 1 View  Result Date: 01/26/2021 CLINICAL DATA:  Onset abdominal pain and vomiting this morning. EXAM: PORTABLE CHEST 1 VIEW COMPARISON:  PA and lateral chest 04/08/2014. FINDINGS: Lungs clear. Heart size normal. Hiatal hernia again seen. No pneumothorax or pleural fluid. No acute or focal bony abnormality. IMPRESSION: No acute disease. Hiatal hernia. Electronically Signed   By: Inge Rise M.D.   On: 01/26/2021 20:00   DG Abd Portable 1 View  Result Date: 01/26/2021 CLINICAL DATA:  Onset abdominal pain and vomiting this morning. EXAM: PORTABLE ABDOMEN - 1 VIEW COMPARISON:  None. FINDINGS: Gas-filled loops of small bowel are dilated up to approximately 4.3 cm. There is some gas in the ascending colon. No unexpected abdominal calcification is seen. No acute bony abnormality. IMPRESSION: Dilated loops of small bowel with gas in the colon worrisome for early or partial small bowel obstruction. Electronically Signed   By: Inge Rise M.D.   On: 01/26/2021 20:02   DG INTRO LONG GI TUBE  Result Date: 01/27/2021 CLINICAL DATA:  Small-bowel obstruction. Feeding tube for decompression. EXAM: FL FEEDING TUBE PLACEMENT CONTRAST:  50 mL of Omnipaque 300 through the NG tube FLUOROSCOPY TIME:  Fluoroscopy Time:  3 minutes 12 second Radiation Exposure Index (if provided by the fluoroscopic device): Number of Acquired Spot Images: 2 COMPARISON:  CT abdomen pelvis 01/26/2021 FINDINGS: Review of the prior CT reveals a large hiatal hernia and small-bowel obstruction. NG tube passes through the right nares after lubrication and anesthesia with lidocaine lubricant. NG tube placed into the hiatal hernia. It was difficult to enter the stomach below the hernia. Addition of the Amplatz wire and injection of  contrast eventually allowed the tube to pass into the stomach. Final tube position is in the antrum of the stomach. IMPRESSION: Large hiatal hernia. Feeding tube placed with the tip in the antrum of the stomach. Electronically Signed   By: Franchot Gallo M.D.   On: 01/27/2021 10:20   ECHOCARDIOGRAM COMPLETE  Result Date: 01/31/2021    ECHOCARDIOGRAM REPORT   Patient Name:   KENDALYN CRANFIELD Horney Date of Exam: 01/31/2021 Medical Rec #:  [id number]     Height:       63.0 in Accession #:    [id number]    Weight:       180.0 lb Date of Birth:  1941-04-21     BSA:          1.849 m Patient Age:    65 years      BP:           127/62 mmHg Patient Gender: F             HR:           81 bpm. Exam Location:  Inpatient Procedure: 2D Echo, Color Doppler and Cardiac Doppler Indications:    Pre op eval  History:        Patient has no prior history of Echocardiogram examinations.                 COPD; Risk Factors:Hypertension.  Sonographer:    [place] Referring Phys: [id number] Diagonal  Comments: Patient is morbidly obese. Image acquisition challenging due to patient body habitus and Image acquisition challenging due to respiratory motion. IMPRESSIONS  1. Left ventricular ejection fraction, by estimation, is 55 to 60%. The left ventricle has normal function. The left ventricle has no regional wall motion abnormalities. Left ventricular diastolic parameters are indeterminate.  2. Right ventricular systolic function is low normal. The right ventricular size is normal.  3. The mitral valve is abnormal. No evidence of mitral valve regurgitation.  4. The aortic valve is tricuspid. Aortic valve regurgitation is not visualized. Mild to moderate aortic valve sclerosis/calcification is present, without any evidence of aortic stenosis.  5. The inferior vena cava is normal in size with greater than 50% respiratory variability, suggesting right atrial pressure of 3 mmHg. FINDINGS  Left Ventricle: Left ventricular  ejection fraction, by estimation, is 55 to 60%. The left ventricle has normal function. The left ventricle has no regional wall motion abnormalities. The left ventricular internal cavity size was normal in size. There is  no left ventricular hypertrophy. Left ventricular diastolic parameters are indeterminate. Right Ventricle: The right ventricular size is normal. Right vetricular wall thickness was not assessed. Right ventricular systolic function is low normal. Left Atrium: Left atrial size was normal in size. Right Atrium: Right atrial size was normal in size. Pericardium: There is no evidence of pericardial effusion. Mitral Valve: The mitral valve is abnormal. There is mild thickening of the mitral valve leaflet(s). Mild mitral annular calcification. No evidence of mitral valve regurgitation. MV peak gradient, 4.7 mmHg. The mean mitral valve gradient is 2.0 mmHg. Tricuspid Valve: The tricuspid valve is normal in structure. Tricuspid valve regurgitation is not demonstrated. Aortic Valve: The aortic valve is tricuspid. Aortic valve regurgitation is not visualized. Mild to moderate aortic valve sclerosis/calcification is present, without any evidence of aortic stenosis. Pulmonic Valve: The pulmonic valve was not well visualized. Pulmonic valve regurgitation is not visualized. Aorta: The aortic root is normal in size and structure. Venous: The inferior vena cava is normal in size with greater than 50% respiratory variability, suggesting right atrial pressure of 3 mmHg. IAS/Shunts: The interatrial septum was not assessed.  LEFT VENTRICLE PLAX 2D LVIDd:         4.00 cm     Diastology LVIDs:         2.70 cm     LV e' medial:    4.90 cm/s LV PW:         0.90 cm     LV E/e' medial:  15.7 LV IVS:        0.90 cm     LV e' lateral:   5.22 cm/s LVOT diam:     1.80 cm     LV E/e' lateral: 14.8 LVOT Area:     2.54 cm  LV Volumes (MOD) LV vol d, MOD A2C: 29.3 ml LV vol d, MOD A4C: 34.8 ml LV vol s, MOD A2C: 13.5 ml LV vol s,  MOD A4C: 18.3 ml LV SV MOD A2C:     15.8 ml LV SV MOD A4C:     34.8 ml LV SV MOD BP:      16.5 ml RIGHT VENTRICLE TAPSE (M-mode): 2.1 cm LEFT ATRIUM             Index LA diam:        2.30 cm 1.24 cm/m LA Vol (A2C):   8.8 ml  4.78 ml/m LA Vol (A4C):   48.1 ml 26.01 ml/m LA Biplane Vol:  22.0 ml 11.90 ml/m                        PULMONIC VALVE AORTA                 PV Vmax:       1.25 m/s Ao Root diam: 3.00 cm PV Vmean:      86.600 cm/s                       PV VTI:        0.190 m                       PV Peak grad:  6.2 mmHg                       PV Mean grad:  3.0 mmHg  MITRAL VALVE MV Area (PHT): 2.24 cm    SHUNTS MV Peak grad:  4.7 mmHg    Systemic Diam: 1.80 cm MV Mean grad:  2.0 mmHg MV Vmax:       1.08 m/s MV Vmean:      65.6 cm/s MV Decel Time: 338 msec MV E velocity: 77.10 cm/s MV A velocity: 87.50 cm/s MV E/A ratio:  0.88 Dorris Carnes MD Electronically signed by Dorris Carnes MD Signature Date/Time: 01/31/2021/5:43:22 PM    Final    [place] EKG SITE RITE  Result Date: 01/31/2021 If Site Rite image not attached, placement could not be confirmed due to current cardiac rhythm.   ASSESSMENT AND PLAN: 1.  pT3, pN2b (Stage IIIC) Colon adenocarcinoma 2.  Chronic iron deficiency anemia 3.  Small pulmonary nodules noted on CT scan, too small to characterize 4.  COPD 5.  Hypothyroidism with remote history of thyroid cancer  -Surgical pathology discussed with the patient.  We discussed staging information.  Discussed the role for adjuvant chemotherapy.  Due to her advanced age and limited social support, she is not a candidate for FOLFOX or CAPEOX.  We discussed Xeloda as adjuvant therapy.  We will have more detailed discussion about this treatment as an outpatient when she recovers from surgery. -She has indeterminate small nodules in her lungs.  These will be followed as an outpatient on future CT scans. -The patient has been followed by our office for chronic iron deficiency anemia.  She has received IV  iron, vitamin B12, and blood transfusions in the past.    Hemoglobin is down to 9.5 today.  Last iron studies in March 2022 consistent with iron deficiency. Discussed with hospitlaist who plans to give a dose a IV iron. We can repeat this as needed in our office.  -Continue Synthroid for hypothyroidism.  She will have ongoing management per primary care provider.   LOS: 12 days   Mikey Bussing, DNP, AGPCNP-BC, AOCNP 02/07/21   Addendum  I have seen the patient, examined her. I agree with the assessment and and plan and have edited the notes.   Ms Portner is recovering from surgery and may go home soon. I reviewed her surgical pathology findings with her in detail.  Unfortunately she had a total of 7 positive lymph nodes, which predicts very high risk (>50%) of recurrence, specially distant recurrence. The standard adjuvant chemotherapy is 6 months of FOLFOX or Capox, but I doubt she can tolerate intensive chemotherapy given her very limited social support. I will likely let her try single agent Xeloda  for 6 months. She is open to it, but would like to get well first. I am waiting for MMR/MSI result. Will schedule her to see me back in office in 3-4 weeks. All questions were answered.   Truitt Merle  02/07/2021

## 2021-02-07 NOTE — Progress Notes (Signed)
Physical Therapy Treatment Patient Details Name: Angelica [name] MRN: [id number] DOB: August 04, 1941 Today's Date: 02/07/2021    History of Present Illness Angelica [name] is an 80 year old female admitted for large bowel obstruction. Previous outpatient colonoscopy in June but due to worsening symptoms, she presented to the emergency room.  Repeat CT abdomen/pelvis on admission showed high-grade proximal large bowel obstruction involving the mid ascending colon concerning for underlying neoplasm.  She underwent colonoscopy on 01/29/2021 which showed a likely malignant completely obstructing tumor in the distal ascending colon which was biopsied. 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  She had a CT of the chest performed 01/30/2021 which showed multiple small bilateral pulmonary nodules measuring 6 mm and smaller which are nonspecific but suspicious for pulmonary metastatic disease, prominent mediastinal lymph nodes which were similar in appearance to prior CT from November 2009 which are likely benign and reactive.  The patient was taken to the OR on 02/02/2021 for laparoscopic right colectomy due to her obstructing ascending colon cancer. PMH: remote history of thyroid cancer, celiac disease, COPD, hypertension, and iron deficiency anemia.    PT Comments    Pt with improvement in ambulation tolerance this session, ambulating 64 ft then additional 60 ft after seated rest break using RW and supv for safety. Pt cued on therapeutic exercises with good motor control and strength noted. Pt adamant she does not want to go to SNF, educated pt on previous and current PT recommendation is home with home health, pt states her neighbors and friends will check on her and assist her as needed. Continue to progress acute PT as able.   Follow Up Recommendations  Home health PT     Equipment Recommendations  None recommended by PT    Recommendations for Other Services       Precautions  / Restrictions Precautions Precautions: Fall Restrictions Weight Bearing Restrictions: No    Mobility  Bed Mobility   Bed Mobility: Supine to Sit;Sit to Supine  Supine to sit: Supervision;HOB elevated Sit to supine: Supervision;HOB elevated   General bed mobility comments: HOB elevated, use of hand on bedrail to upright trunk, silghtly increased time    Transfers Overall transfer level: Needs assistance Equipment used: Rolling walker (2 wheeled) Transfers: Sit to/from Stand Sit to Stand: Supervision    General transfer comment: powers to stand with BUE assisting to power up  Ambulation/Gait Ambulation/Gait assistance: Supervision Gait Distance (Feet): 64 Feet (additional 60 ft) Assistive device: Rolling walker (2 wheeled) Gait Pattern/deviations: Step-through pattern;Decreased stride length Gait velocity: decreased   General Gait Details: 2 ambulation bouts with seated rest break between, slow steap steps with RW, VCs to maintain body within RW frame with turns, denies pain, dizziness, or SOB, no LOB   Stairs             Wheelchair Mobility    Modified Rankin (Stroke Patients Only)       Balance Overall balance assessment: Needs assistance Sitting-balance support: Feet supported Sitting balance-Leahy Scale: Good Sitting balance - Comments: seated EOB   Standing balance support: During functional activity Standing balance-Leahy Scale: Fair Standing balance comment: static without UE support, dynamic with RW         Cognition Arousal/Alertness: Awake/alert Behavior During Therapy: WFL for tasks assessed/performed Overall Cognitive Status: Within Functional Limits for tasks assessed     Exercises General Exercises - Lower Extremity Long Arc Quad: Seated;AROM;Strengthening;Both;15 reps Hip Flexion/Marching: Seated;AROM;Strengthening;Both;15 reps Other Exercises Other Exercises: STS, 10 reps from EOB with RW  General Comments        Pertinent  Vitals/Pain Pain Assessment: No/denies pain    Home Living                      Prior Function            PT Goals (current goals can now be found in the care plan section) Acute Rehab PT Goals Patient Stated Goal: return home PT Goal Formulation: With patient Time For Goal Achievement: 02/18/21 Potential to Achieve Goals: Good Progress towards PT goals: Progressing toward goals    Frequency    Min 3X/week      PT Plan Current plan remains appropriate    Co-evaluation              AM-PAC PT "6 Clicks" Mobility   Outcome Measure  Help needed turning from your back to your side while in a flat bed without using bedrails?: None Help needed moving from lying on your back to sitting on the side of a flat bed without using bedrails?: A Little Help needed moving to and from a bed to a chair (including a wheelchair)?: A Little Help needed standing up from a chair using your arms (e.g., wheelchair or bedside chair)?: A Little Help needed to walk in hospital room?: A Little Help needed climbing 3-5 steps with a railing? : A Little 6 Click Score: 19    End of Session Equipment Utilized During Treatment: Gait belt Activity Tolerance: Patient tolerated treatment well Patient left: in bed;with call bell/phone within reach;with bed alarm set;with family/visitor present Nurse Communication: Mobility status PT Visit Diagnosis: Other abnormalities of gait and mobility (R26.89)     Time: 0175-1025 PT Time Calculation (min) (ACUTE ONLY): 28 min  Charges:  $Gait Training: 8-22 mins $Therapeutic Exercise: 8-22 mins                      Tori Armando Lauman PT, DPT 02/07/21, 12:47 PM

## 2021-02-07 NOTE — Progress Notes (Addendum)
PROGRESS NOTE    Angelica [name]  VZC:[id number] DOB: 19-Sep-1941 DOA: 01/26/2021 PCP: Velna Hatchet, MD    Chief Complaint  Patient presents with  . Emesis  . Abdominal Pain    Brief Narrative:  Angelica [name] a 80 y.o.femalewith medical history significant forremote hx of thyroid cancer, celiac disease, rectal bleed, COPD, HTN, iron deficiency anemia who presents withabdominal pain, bloatingand persistentnausea and vomiting. Patient recently had CT of the abdomen done4/14showing abnormal thickening and luminal narrowing of the cecum and ascending colon with differential including carcinoid, IBD or focal colitis. She had plan colonoscopy with GI in June however presented to ED, given worsening symptoms. In ED: CT showing high-grade proximal large bowel obstruction involving the mid ascending colon. Patient was initially hypotensive with tachycardic tachypneic. Stated her symptoms have largely resolved. Surgery Dr. Dema Severin has evaluated patient at bedside and recommended NG tube placement and GI consult.   Assessment & Plan:   Principal Problem:   Large bowel obstruction (HCC) Active Problems:   Hypokalemia   COPD (chronic obstructive pulmonary disease)- on nocturnal oxygen at home   Acquired hypothyroidism   Hypotension   Hyperglycemia   Adenocarcinoma of colon ([place])  1 large bowel obstruction secondary to moderately differentiated colorectal adenocarcinoma, POA -Patient had presented with ongoing abdominal pain, bloating, persistent nausea and vomiting.  Patient noted to have had a recent CT scan done on 414 that showed abnormal thickening and luminal narrowing of the cecum and ascending colon with differential including carcinoid, IBD or focal colitis. -CT abdomen and pelvis done did show a high-grade proximal large bowel obstruction involving the mid ascending colon. -General surgery consulted who recommended NG tube placement, bowel rest, IV fluids, evaluation for GI for  potential endoscopic evaluation. -Patient was seen in consultation by GI and patient underwent colonoscopy with colonic biopsies revealing moderately differentiated colorectal adenocarcinoma. -Patient being followed by general surgery and patient s/p laparoscopic assisted partial colectomy 02/02/2021 with surgical pathology with invasive colorectal adenocarcinoma, 5.2 cm, carcinoma extends into pericolonic connective tissue, margins not involved, metastatic carcinoma in 7/28 lymph nodes, one extramural satellite nodule.   -CT chest which was done with multiple small bilateral pulmonary nodules measuring 6 mm and smaller, nonspecific although suspicious for pulmonary metastatic disease. -CEA at 5.8 -Patient tolerated clamping trial and NG tube discontinued.   -Started on clears, diet advanced and currently on a soft diet.   -Having flatus and bowel movements.   -TPN wean per general surgery/pharmacy.   -Continue mobilization.   -Patient seen in consultation by oncology, Dr. Burr Medico and will likely follow-up in the outpatient setting.   -Per general surgery.    2.  Hypokalemia -Potassium of 4.1.  Magnesium at 2.0.  Phosphorus of 4.1.   -Electrolyte replacement per pharmacy.    3.  Hyperglycemia -Hemoglobin A1c at 6. -CBG at 113 this morning. -Discontinue D5 half-normal saline. -TPN being weaned off.  -Currently on a soft diet.  -SSI.  4.  Hypotension -Felt secondary to dehydration, poor oral intake. -Improved with hydration.   -Supportive care.  5.  Hypothyroidism with remote history of thyroid cancer -Continue IV Synthroid likely transition to oral Synthroid in the next 24 hours.   -Outpatient follow-up.  6.  COPD -Stable.  As needed oxygen at night at baseline.    7.  SIRS criteria without sepsis; likely reactive secondary to above, POA/lactic acidosis due to profound dehydration -Patient presented with tachycardia, tachypnea, noted to have a leukocytosis but no clear  source. -Resolved with  supportive care, pain management.  -Supportive care.  8.  Iron deficiency anemia -Anemia panel from 12/09/2020 with a iron of 38, ferritin of 38, TIBC of 283. -Hemoglobin 9.5. -IV Feraheme ordered per hematology/oncology. -Outpatient follow-up.   DVT prophylaxis: Lovenox. Code Status: Full Family Communication: Updated patient.  No family at bedside.  Disposition:   Status is: Inpatient    Dispo: The patient is from: Home              Anticipated d/c is to: Home with home health              Patient currently with large bowel obstruction secondary to malignant tumor, status post laparoscopic right colectomy.  Bowel function returning, diet being advanced.  TPN being transitioned off.  Not stable for discharge.    Difficult to place patient no       Consultants:   General surgery: Dr. Dema Severin 01/27/2021  Gastroenterology: Dr. Therisa Doyne 01/28/2021  Oncology: Dr. Burr Medico 02/03/2021  Procedures:   CT chest 01/30/2021  CT abdomen and pelvis 01/26/2021  Abdominal films 01/27/2021, 01/26/2021  Chest x-ray 01/26/2021  NG tube/feeding placement under fluoroscopy per IR 01/27/2021 Dr. Carlis Abbott  2D echo 01/31/2021  Colonoscopy with biopsy per Dr. Therisa Doyne, GI 01/29/2021  Laparoscopic right colectomy for obstructing ascending colon cancer per Dr. Barry Dienes 02/02/2021  Antimicrobials:   None   Subjective: Patient states she is feeling better.  Denies any chest pain, no shortness of breath, no abdominal pain.  States when she eats she feels some gassy pains.  Still passing flatus.  Still having bowel movements.  On a soft diet.  Patient stated breakfast which was brought to her was cold.   Patient stated worked with therapy this morning and ambulated in hallways.    Objective: Vitals:   02/06/21 1307 02/06/21 2015 02/07/21 0533 02/07/21 0933  BP: (!) 141/81 121/68 129/71   Pulse: 83 80 78   Resp: 16 18 20    Temp: 98.4 F (36.9 C) 98.3 F (36.8 C) 98.4 F (36.9 C)   TempSrc:  Oral Oral Oral   SpO2: 99% 99% 100%   Weight:    88.9 kg  Height:        Intake/Output Summary (Last 24 hours) at 02/07/2021 1051 Last data filed at 02/07/2021 0532 Gross per 24 hour  Intake 1201.21 ml  Output 625 ml  Net 576.21 ml   Filed Weights   02/01/21 1000 02/05/21 0417 02/07/21 0933  Weight: 84.2 kg 85.2 kg 88.9 kg    Examination:  General exam: : NAD Respiratory system: CTA B anterior lung fields.  No wheezes, no rhonchi.  Speaking in full sentences.  Normal respiratory effort. Cardiovascular system: Regular rate and rhythm no murmurs rubs or gallops.  No JVD.  No lower extremity edema.  Gastrointestinal system: Abdomen soft, nontender, nondistended, positive bowel sounds.  Incision site C/D/I.  No rebound.  No guarding. Central nervous system: Alert and oriented. No focal neurological deficits. Extremities: Symmetric 5 x 5 power. Skin: No rashes, lesions or ulcers Psychiatry: Judgement and insight appear normal. Mood & affect appropriate.  Data Reviewed: I have personally reviewed following labs and imaging studies  CBC: Recent Labs  Lab 02/01/21 0340 02/03/21 0358 02/04/21 0541 02/05/21 0356 02/07/21 0325  WBC 7.3 12.6* 10.9* 11.1* 10.5  NEUTROABS 4.5 10.8*  --   --  5.8  HGB 13.2 12.0 11.0* 11.0* 9.5*  HCT 41.2 37.2 34.3* 33.9* 30.3*  MCV 87.8 87.3 87.9 88.5 89.4  PLT 253  236 208 238 562    Basic Metabolic Panel: Recent Labs  Lab 02/03/21 0358 02/04/21 0541 02/05/21 0356 02/06/21 0325 02/07/21 0325  NA 135 135 134* 134* 136  K 5.0 4.3 4.4 4.3 4.1  CL 101 100 100 101 101  CO2 30 32 26 27 29   GLUCOSE 178* 146* 134* 143* 109*  BUN 18 21 20 22 19   CREATININE 0.58 0.63 0.52 0.55 0.61  CALCIUM 8.9 8.5* 8.6* 8.3* 8.3*  MG 1.8 1.9 2.1 1.8 2.0  PHOS 2.5 4.4 3.9 4.1 4.1    GFR: Estimated Creatinine Clearance: 58.3 mL/min (by C-G formula based on SCr of 0.61 mg/dL).  Liver Function Tests: Recent Labs  Lab 02/01/21 0340 02/03/21 0358  02/07/21 0325  AST 15 17 10*  ALT 12 15 10   ALKPHOS 45 40 41  BILITOT 0.7 0.4 0.5  PROT 6.6 6.0* 5.5*  ALBUMIN 3.2* 3.1* 2.5*    CBG: Recent Labs  Lab 02/06/21 0647 02/06/21 1130 02/06/21 1649 02/07/21 0000 02/07/21 0524  GLUCAP 155* 143* 135* 143* 113*     No results found for this or any previous visit (from the past 240 hour(s)).       Radiology Studies: No results found.      Scheduled Meds: . sodium chloride   Intravenous Once  . Chlorhexidine Gluconate Cloth  6 each Topical Daily  . enoxaparin (LOVENOX) injection  40 mg Subcutaneous Q24H  . feeding supplement  237 mL Oral BID BM  . insulin aspart  0-9 Units Subcutaneous Q6H  . levothyroxine  62.5 mcg Intravenous Daily  . pantoprazole (PROTONIX) IV  40 mg Intravenous Daily   Continuous Infusions: . dextrose 5 % and 0.45% NaCl 10 mL/hr at 02/06/21 0931  . methocarbamol (ROBAXIN) IV 500 mg (02/07/21 0532)  . TPN ADULT (ION) 50 mL/hr at 02/06/21 1732     LOS: 12 days    Time spent: 35 minutes    Irine Seal, MD [facility]   To contact the attending provider between 7A-7P or the covering provider during after hours 7P-7A, please log into the web site [email or web address] and access using universal [place] password for that web site. If you do not have the password, please call the hospital operator.  02/07/2021, 10:51 AM

## 2021-02-07 NOTE — Progress Notes (Signed)
Progress Note  5 Days Post-Op  Subjective: Patient reports some cramping abdominal pain with eating, resolved with passing flatus and having BM. Patient reports BMs are loose presently. She denies nausea. She is unsure whether she has had any PO nutritional supplements. She is very motivated to go home once she leaves the hospital.   Objective: Vital signs in last 24 hours: Temp:  [98.3 F (36.8 C)-98.4 F (36.9 C)] 98.4 F (36.9 C) (05/16 0533) Pulse Rate:  [78-83] 78 (05/16 0533) Resp:  [16-20] 20 (05/16 0533) BP: (121-141)/(68-81) 129/71 (05/16 0533) SpO2:  [99 %-100 %] 100 % (05/16 0533) Weight:  [88.9 kg] 88.9 kg (05/16 0933) Last BM Date: 02/06/21  Intake/Output from previous day: 05/15 0701 - 05/16 0700 In: 1776.2 [P.O.:130; I.V.:1396.2; IV Piggyback:250] Out: 625 [Urine:625] Intake/Output this shift: No intake/output data recorded.  PE: General: pleasant, WD,elderlyfemale who is laying in bed in NAD Heart: regular, rate, and rhythm.  Lungs: CTAB, no wheezes, rhonchi, or rales noted. Respiratory effort nonlabored Abd: soft,appropriately ttp, ND, incisions c/d/i with ecchymosis present, +BS  Lab Results:  Recent Labs    02/05/21 0356 02/07/21 0325  WBC 11.1* 10.5  HGB 11.0* 9.5*  HCT 33.9* 30.3*  PLT 238 293   BMET Recent Labs    02/06/21 0325 02/07/21 0325  NA 134* 136  K 4.3 4.1  CL 101 101  CO2 27 29  GLUCOSE 143* 109*  BUN 22 19  CREATININE 0.55 0.61  CALCIUM 8.3* 8.3*   PT/INR No results for input(s): LABPROT, INR in the last 72 hours. CMP     Component Value Date/Time   NA 136 02/07/2021 0325   NA 141 11/10/2013 1449   K 4.1 02/07/2021 0325   K 3.7 11/10/2013 1449   CL 101 02/07/2021 0325   CO2 29 02/07/2021 0325   CO2 27 11/10/2013 1449   GLUCOSE 109 (H) 02/07/2021 0325   GLUCOSE 116 11/10/2013 1449   BUN 19 02/07/2021 0325   BUN 13.2 11/10/2013 1449   CREATININE 0.61 02/07/2021 0325   CREATININE 0.8 11/10/2013 1449    CALCIUM 8.3 (L) 02/07/2021 0325   CALCIUM 9.1 11/10/2013 1449   PROT 5.5 (L) 02/07/2021 0325   PROT 7.1 11/10/2013 1449   ALBUMIN 2.5 (L) 02/07/2021 0325   ALBUMIN 3.8 11/10/2013 1449   AST 10 (L) 02/07/2021 0325   AST 19 11/10/2013 1449   ALT 10 02/07/2021 0325   ALT 17 11/10/2013 1449   ALKPHOS 41 02/07/2021 0325   ALKPHOS 76 11/10/2013 1449   BILITOT 0.5 02/07/2021 0325   BILITOT 0.74 11/10/2013 1449   GFRNONAA >60 02/07/2021 0325   GFRAA >60 05/06/2020 1441   Lipase     Component Value Date/Time   LIPASE 35 01/26/2021 1830       Studies/Results: No results found.  Anti-infectives: Anti-infectives (From admission, onward)   Start     Dose/Rate Route Frequency Ordered Stop   02/02/21 1830  cefoTEtan (CEFOTAN) 2 g in sodium chloride 0.9 % 100 mL IVPB  Status:  Discontinued       Note to Pharmacy: Doesn't even recall if she is allergic to PCN   2 g 200 mL/hr over 30 Minutes Intravenous  Once 02/02/21 1742 02/02/21 1814   02/02/21 0600  ceFAZolin (ANCEF) 1 g in sodium chloride 0.9 % 100 mL IVPB  Status:  Discontinued       Note to Pharmacy: Patient states she doesn't even know if she is allergic to PCN  1 g 200 mL/hr over 30 Minutes Intravenous On call to O.R. 02/01/21 5462 02/01/21 1246   02/02/21 0600  cefoTEtan (CEFOTAN) 2 g in sodium chloride 0.9 % 100 mL IVPB       Note to Pharmacy: Doesn't even recall if she is allergic to PCN   2 g 200 mL/hr over 30 Minutes Intravenous On call to O.R. 02/01/21 1246 02/02/21 1345       Assessment/Plan H/O thyroid cancer HTN COPD, prn 2L O2 at home Iron deficiency anemia -likely secondary to below, hgb 9.5 Severe protein calorie malnutrition - prealbumin 10.1, tolerating PO intake, dc TPN and add PO supplements   Partially obstructing ascending colon mass S/P laparoscopic right hemicolectomy 02/02/21 Dr. Barry Dienes -POD#5 - tolerating soft diet and having bowel function  - dc TPN and add nutritional supplements today  -  surgical path with adenocarcinoma of 7/28 +LN - medical oncology aware and following - patient mobilizing with PT/OT and hopeful to go home  - stable for discharge from a surgical perspective once medically cleared and dispo determined - working on surgical follow up appointment  FEN - soft diet, dc TPN  VTE -lovenox ID - [email or web address]  LOS: 12 days    Norm Parcel, [facility] Surgery 02/07/2021, 11:03 AM Please see Amion for pager number during day hours 7:00am-4:30pm

## 2021-02-08 ENCOUNTER — Telehealth: Payer: Self-pay | Admitting: Hematology

## 2021-02-08 DIAGNOSIS — C182 Malignant neoplasm of ascending colon: Principal | ICD-10-CM

## 2021-02-08 DIAGNOSIS — K219 Gastro-esophageal reflux disease without esophagitis: Secondary | ICD-10-CM

## 2021-02-08 DIAGNOSIS — I10 Essential (primary) hypertension: Secondary | ICD-10-CM

## 2021-02-08 DIAGNOSIS — D500 Iron deficiency anemia secondary to blood loss (chronic): Secondary | ICD-10-CM

## 2021-02-08 LAB — IRON AND TIBC
Iron: 198 ug/dL — ABNORMAL HIGH (ref 28–170)
Saturation Ratios: 102 % — ABNORMAL HIGH (ref 10.4–31.8)
TIBC: 195 ug/dL — ABNORMAL LOW (ref 250–450)

## 2021-02-08 LAB — BASIC METABOLIC PANEL
Anion gap: 5 (ref 5–15)
BUN: 15 mg/dL (ref 8–23)
CO2: 29 mmol/L (ref 22–32)
Calcium: 8.5 mg/dL — ABNORMAL LOW (ref 8.9–10.3)
Chloride: 102 mmol/L (ref 98–111)
Creatinine, Ser: 0.62 mg/dL (ref 0.44–1.00)
GFR, Estimated: >60 mL/min (ref >60)
Glucose, Bld: 93 mg/dL (ref 70–99)
Potassium: 4.2 mmol/L (ref 3.5–5.1)
Sodium: 136 mmol/L (ref 135–145)

## 2021-02-08 LAB — FOLATE: Folate: 5.7 ng/mL — ABNORMAL LOW (ref >5.9)

## 2021-02-08 LAB — CBC WITH DIFFERENTIAL/PLATELET
Abs Immature Granulocytes: 0.14 10*3/uL — ABNORMAL HIGH (ref 0.00–0.07)
Basophils Absolute: 0.1 10*3/uL (ref 0.0–0.1)
Basophils Relative: 1 %
Eosinophils Absolute: 0.4 10*3/uL (ref 0.0–0.5)
Eosinophils Relative: 4 %
HCT: 29.5 % — ABNORMAL LOW (ref 36.0–46.0)
Hemoglobin: 9.4 g/dL — ABNORMAL LOW (ref 12.0–15.0)
Immature Granulocytes: 1 %
Lymphocytes Relative: 33 %
Lymphs Abs: 3.3 10*3/uL (ref 0.7–4.0)
MCH: 28.2 pg (ref 26.0–34.0)
MCHC: 31.9 g/dL (ref 30.0–36.0)
MCV: 88.6 fL (ref 80.0–100.0)
Monocytes Absolute: 1.2 10*3/uL — ABNORMAL HIGH (ref 0.1–1.0)
Monocytes Relative: 12 %
Neutro Abs: 5 10*3/uL (ref 1.7–7.7)
Neutrophils Relative %: 49 %
Platelets: 293 10*3/uL (ref 150–400)
RBC: 3.33 MIL/uL — ABNORMAL LOW (ref 3.87–5.11)
RDW: 14.5 % (ref 11.5–15.5)
WBC: 10.1 10*3/uL (ref 4.0–10.5)
nRBC: 0 % (ref 0.0–0.2)

## 2021-02-08 LAB — GLUCOSE, CAPILLARY
Glucose-Capillary: 90 mg/dL (ref 70–99)
Glucose-Capillary: 138 mg/dL — ABNORMAL HIGH (ref 70–99)

## 2021-02-08 LAB — FERRITIN: Ferritin: 70 ng/mL (ref 11–307)

## 2021-02-08 LAB — VITAMIN B12: Vitamin B-12: 341 pg/mL (ref 180–914)

## 2021-02-08 MED ORDER — TRAMADOL HCL 50 MG PO TABS: 50.0000 mg | ORAL_TABLET | Freq: Four times a day (QID) | ORAL | 0 refills | Status: DC | PRN

## 2021-02-08 MED ORDER — FOLIC ACID 1 MG PO TABS
1.0000 mg | ORAL_TABLET | Freq: Every day | ORAL | Status: DC
  Administered 2021-02-08: 1 mg via ORAL
  Filled 2021-02-08: qty 1

## 2021-02-08 MED ORDER — PANTOPRAZOLE SODIUM 40 MG PO TBEC
40.0000 mg | DELAYED_RELEASE_TABLET | Freq: Every day | ORAL | Status: DC
  Administered 2021-02-08: 40 mg via ORAL
  Filled 2021-02-08: qty 1

## 2021-02-08 MED ORDER — POLYETHYLENE GLYCOL 3350 17 G PO PACK: 17.0000 g | PACK | Freq: Every day | ORAL | 0 refills | Status: DC | PRN

## 2021-02-08 MED ORDER — ACETAMINOPHEN 325 MG PO TABS: 650.0000 mg | ORAL_TABLET | Freq: Four times a day (QID) | ORAL | Status: DC | PRN

## 2021-02-08 MED ORDER — CYANOCOBALAMIN 1000 MCG/ML IJ SOLN
1000.0000 ug | Freq: Every day | INTRAMUSCULAR | Status: DC
  Administered 2021-02-08: 1000 ug via SUBCUTANEOUS
  Filled 2021-02-08: qty 1

## 2021-02-08 MED ORDER — FOLIC ACID 1 MG PO TABS: 1.0000 mg | ORAL_TABLET | Freq: Every day | ORAL | 1 refills | Status: DC

## 2021-02-08 NOTE — Discharge Summary (Signed)
Physician Discharge Summary  Angelica [name] TKP:[id number] DOB: 1941/04/26 DOA: 01/26/2021  PCP: Velna Hatchet, MD  Admit date: 01/26/2021 Discharge date: 02/08/2021  Time spent: 55 minutes  Recommendations for Outpatient Follow-up:  1. Follow-up with Dr. Barry Dienes, general surgery on 03/07/2021 at 10 AM. 2. Follow-up with Dr. Burr Medico, oncology in 2 to 3 weeks. 3. Follow-up with Velna Hatchet, MD in 2 weeks.  On follow-up patient will need a basic metabolic profile done to follow-up on electrolytes and renal function.  Patient will need a CBC done to follow-up on H&H.  Patient's folate deficiency will need to be followed up upon.   Discharge Diagnoses:  Principal Problem:   Large bowel obstruction from mass s/p colectomy 02/02/2021 Active Problems:   Chronic anemia- symptomatic   Hypokalemia   Essential hypertension   COPD (chronic obstructive pulmonary disease)- on nocturnal oxygen at home   GERD (gastroesophageal reflux disease)   Pulmonary HTN (HCC)   Celiac disease   Acquired hypothyroidism   Hypotension   Hyperglycemia   Cancer of ascending colon pT3, pN2b s/p lap proximal colectomy 02/02/2021   Discharge Condition: Stable and improved/  Diet recommendation: Heart healthy/gluten-free diet  Filed Weights   02/01/21 1000 02/05/21 0417 02/07/21 0933  Weight: 84.2 kg 85.2 kg 88.9 kg    History of present illness:  HPI per Dr. Jennette Dubin is a 80 y.o. female with medical history significant for remote hx of thyroid cancer, celiac disease, rectal bleed, COPD, HTN, iron deficiency anemia who presented with abdominal pain, bloating and persistent nausea and vomiting.  Patient recently had CT of the abdomen done 4/14 showing abnormal thickening and luminal narrowing of the cecum and ascending colon with differential including carcinoid, IBD or focal colitis.  She had plan colonoscopy with GI in June however presents today given worsening symptoms. Repeat CT now showing high-grade  proximal large bowel obstruction involving the mid ascending colon.  ED Course: She was initially hypotensive with tachycardic tachypneic on room air in the ED.  States her symptoms have largely resolved.  She had leukocytosis of 14.7, lactate of 4.1.  Potassium of 3.3.  Creatinine of 1.59 from prior 0.99.  Anion gap 16.  Surgery Dr. Dema Severin has evaluated patient at bedside and recommends NG tube placement and GI consult.   Hospital Course:  1 large bowel obstruction secondary to moderately differentiated colorectal adenocarcinoma, POA -Patient had presented with ongoing abdominal pain, bloating, persistent nausea and vomiting.  Patient noted to have had a recent CT scan done on 414 that showed abnormal thickening and luminal narrowing of the cecum and ascending colon with differential including carcinoid, IBD or focal colitis. -CT abdomen and pelvis done did show a high-grade proximal large bowel obstruction involving the mid ascending colon. -General surgery consulted who recommended NG tube placement, bowel rest, IV fluids, evaluation for GI for potential endoscopic evaluation. -Patient was seen in consultation by GI and patient underwent colonoscopy with colonic biopsies revealing moderately differentiated colorectal adenocarcinoma. -Patient being followed by general surgery and patient s/p laparoscopic assisted partial colectomy 02/02/2021 with surgical pathology with invasive colorectal adenocarcinoma, 5.2 cm, carcinoma extends into pericolonic connective tissue, margins not involved, metastatic carcinoma in 7/28 lymph nodes, one extramural satellite nodule.   -CT chest which was done with multiple small bilateral pulmonary nodules measuring 6 mm and smaller, nonspecific although suspicious for pulmonary metastatic disease. -CEA at 5.8 -Patient tolerated clamping trial and NG tube discontinued.   -Patient started on diet with clears and diet  advanced to a soft diet which she tolerated.   -TPN  subsequently weaned off.  -Patient seen in consultation by oncology, Dr. Burr Medico and will follow-up in the outpatient setting. -Patient cleared by general surgery for discharge.  Patient will be discharged home in stable and improved condition with outpatient follow-up with general surgery, PCP, oncology.    2.  Hypokalemia -Repleted.   -Outpatient follow-up.    3.  Hyperglycemia -Hemoglobin A1c at 6. -Patient also maintained on TPN during the hospitalization which was subsequently weaned off.   -Patient diet was advanced and patient tolerated soft diet.   -Patient also noted to be on D5 half-normal saline which was subsequently discontinued.   -Outpatient follow-up with PCP.  4.  Hypotension -Felt secondary to dehydration, poor oral intake. -Improved with hydration.  -Patient's antihypertensive medication of Norvasc was discontinued during the hospitalization. -Outpatient follow-up with PCP.  5.  Hypothyroidism with remote history of thyroid cancer -Patient maintained on IV Synthroid during the hospitalization as she was n.p.o.   -Oral home regimen Synthroid will be resumed on discharge.   -Outpatient follow-up.   6.  COPD -Stable.  As needed oxygen at night at baseline.    7.  SIRS criteria without sepsis; likely reactive secondary to above, POA/lactic acidosis due to profound dehydration -Patient presented with tachycardia, tachypnea, noted to have a leukocytosis but no clear source. -Resolved with supportive care, pain management.  -Supportive care.  8.  Iron deficiency anemia/folate deficiency -Anemia panel from 12/09/2020 with a iron of 38, ferritin of 38, TIBC of 283. -Hemoglobin 9.5. -IV Feraheme given during this hospitalization.  -Patient also placed on folic acid supplementation.   -Outpatient follow-up.     Procedures:  CT chest 01/30/2021  CT abdomen and pelvis 01/26/2021  Abdominal films 01/27/2021, 01/26/2021  Chest x-ray 01/26/2021  NG tube/feeding  placement under fluoroscopy per IR 01/27/2021 Dr. Carlis Abbott  2D echo 01/31/2021  Colonoscopy with biopsy per Dr. Therisa Doyne, GI 01/29/2021  Laparoscopic right colectomy for obstructing ascending colon cancer per Dr. Barry Dienes 02/02/2021    Consultations:  General surgery: Dr. Dema Severin 01/27/2021  Gastroenterology: Dr. Therisa Doyne 01/28/2021  Oncology: Dr. Burr Medico 02/03/2021    Discharge Exam: Vitals:   02/07/21 2216 02/08/21 0504  BP: 125/71 120/75  Pulse: 85 82  Resp: 20 20  Temp: 98.6 F (37 C) 98.5 F (36.9 C)  SpO2: 97% 97%    General: NAD Cardiovascular: RRR Respiratory: CTAB  Discharge Instructions   Discharge Instructions    Diet - low sodium heart healthy   Complete by: As directed    Gluten-free diet   Discharge wound care:   Complete by: As directed    As above   Increase activity slowly   Complete by: As directed      Allergies as of 02/08/2021      Reactions   Other Hives   Pt states she has several other allergies but can't recall the names, pt recalled the medication to be codeine.   Fosamax [alendronate Sodium]    Penicillins Other (See Comments)   Unknown Did it involve swelling of the face/tongue/throat, SOB, or low BP? Unknown Did it involve sudden or severe rash/hives, skin peeling, or any reaction on the inside of your mouth or nose? Unknown Did you need to seek medical attention at a hospital or doctor's office? Unknown When did it last happen? If all above answers are "NO", may proceed with cephalosporin use.   Sulfa Antibiotics    Codeine Rash  Blisters "       Medication List    STOP taking these medications   amLODipine 5 MG tablet Commonly known as: NORVASC     TAKE these medications   acetaminophen 325 MG tablet Commonly known as: TYLENOL Take 2 tablets (650 mg total) by mouth every 6 (six) hours as needed for mild pain or fever. What changed:   medication strength  how much to take  reasons to take this   albuterol 108 (90 Base)  MCG/ACT inhaler Commonly known as: VENTOLIN HFA Inhale into the lungs every 6 (six) hours as needed for wheezing or shortness of breath.   docusate sodium 250 MG capsule Commonly known as: COLACE Take 250 mg by mouth daily as needed for constipation.   folic acid 1 MG tablet Commonly known as: FOLVITE Take 1 tablet (1 mg total) by mouth daily. Start taking on: Feb 09, 2021   hydrocortisone 25 MG suppository Commonly known as: ANUSOL-HC Place 1 suppository (25 mg total) rectally 2 (two) times daily as needed for hemorrhoids or anal itching.   levothyroxine 125 MCG tablet Commonly known as: SYNTHROID Take 125 mcg by mouth daily.   ondansetron 8 MG disintegrating tablet Commonly known as: Zofran ODT Take 1 tablet (8 mg total) by mouth every 8 (eight) hours as needed.   OXYGEN Inhale 2 L into the lungs as needed (LOW OXYGEN).   pantoprazole 40 MG tablet Commonly known as: PROTONIX Take 1 tablet (40 mg total) by mouth daily.   polyethylene glycol 17 g packet Commonly known as: MiraLax Take 17 g by mouth daily as needed for mild constipation. What changed:   when to take this  reasons to take this   traMADol 50 MG tablet Commonly known as: ULTRAM Take 1 tablet (50 mg total) by mouth every 6 (six) hours as needed for moderate pain or severe pain (50 mg for moderate, 100 mg for severe).            Discharge Care Instructions  (From admission, onward)         Start     Ordered   02/08/21 0000  Discharge wound care:       Comments: As above   02/08/21 1044         Allergies  Allergen Reactions  . Other Hives    Pt states she has several other allergies but can't recall the names, pt recalled the medication to be codeine.  . Fosamax [Alendronate Sodium]   . Penicillins Other (See Comments)    Unknown Did it involve swelling of the face/tongue/throat, SOB, or low BP? Unknown Did it involve sudden or severe rash/hives, skin peeling, or any reaction on the  inside of your mouth or nose? Unknown Did you need to seek medical attention at a hospital or doctor's office? Unknown When did it last happen? If all above answers are "NO", may proceed with cephalosporin use.  . Sulfa Antibiotics   . Codeine Rash    Blisters "     Follow-up Information    Stark Klein, MD. Go on 03/07/2021.   Specialty: General Surgery Why: 10:00 AM. Please arrive 30 min prior to appointment time for check in. Bring photo ID and insurance information with you.  Contact information: [address] [place] [address] [place], [place] Follow up.   Why: [facility]ational therapy Contact information: [place] [address] [phone]        Velna Hatchet, MD. Schedule an appointment as soon as possible for a visit in 2 week(s).   Specialty: Internal Medicine Contact information: [place] [address] [phone]        Truitt Merle, MD. Schedule an appointment as soon as possible for a visit in 2 week(s).   Specialties: Hematology, Oncology Why: f/u in 2-3 weeks. Contact information: [place] [address] [phone]                The results of significant diagnostics from this hospitalization (including imaging, microbiology, ancillary and laboratory) are listed below for reference.    Significant Diagnostic Studies: CT Abdomen Pelvis Wo Contrast  Result Date: 01/26/2021 CLINICAL DATA:  Unspecified abdominal pain, vomiting EXAM: CT ABDOMEN AND PELVIS WITHOUT CONTRAST TECHNIQUE: Multidetector CT imaging of the abdomen and pelvis was performed following the standard protocol without IV contrast. COMPARISON:  01/06/2021 FINDINGS: Lower chest: Large hiatal hernia is present which appears fluid-filled. The visualized lung bases are clear. The visualized heart and pericardium are unremarkable. Hepatobiliary:  Stable cyst within the left hepatic lobe. No definite solid intrahepatic mass identified on this noncontrast examination. Cholecystectomy has been performed. No intra or extrahepatic biliary ductal dilation. Pancreas: Unremarkable Spleen: Unremarkable Adrenals/Urinary Tract: The adrenal glands are unremarkable. The kidneys are normal in size and position. 6 mm nonobstructing calculus noted within the lower pole of the right kidney. The kidneys are otherwise unremarkable. The bladder is unremarkable. Stomach/Bowel: There is a high-grade proximal large bowel obstruction involving the mid ascending colon with the point transition best seen axial image # 39/2 and coronal image # 87/5. 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 An underlying intrinsic neoplasm, however, is not excluded on this examination. Proximally, the small bowel is dilated and fluid-filled. Distally, the large bowel is decompressed. There is moderate sigmoid diverticulosis. No free intraperitoneal gas or fluid. Vascular/Lymphatic: Moderate atherosclerotic calcification within the abdominal aorta. No aortic aneurysm. No pathologic adenopathy within the abdomen and pelvis. Reproductive: Status post hysterectomy. No adnexal masses. Other: Tiny fat containing umbilical hernia. The rectum is unremarkable. Musculoskeletal: No acute bone abnormality. No lytic or blastic bone lesion. IMPRESSION: High-grade proximal large bowel obstruction involving the mid ascending colon. 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 Mild right nonobstructing nephrolithiasis. Large hiatal hernia Aortic Atherosclerosis (ICD10-I70.0). Electronically Signed   By: Fidela Salisbury MD   On: 01/26/2021 21:50   DG Abdomen 1 View  Result Date:  01/27/2021 CLINICAL DATA:  NG tube placement. EXAM: ABDOMEN - 1 VIEW COMPARISON:  Jan 26, 2021 FINDINGS: A nasogastric tube is seen with its distal end looped upon itself, overlying the medial aspect of the right lung base and adjacent portion of the thoracic spine. This is likely within the patient's known large gastric hernia. The bowel gas pattern is normal. Radiopaque surgical clips are seen overlying the right upper quadrant. No radio-opaque calculi or other significant radiographic abnormality are seen. IMPRESSION: Nasogastric tube positioning, as described above Electronically Signed   By: Virgina Norfolk M.D.   On: 01/27/2021 02:26   CT CHEST W CONTRAST  Result Date: 01/30/2021 CLINICAL DATA:  Colorectal cancer staging, obstructing ascending colon mass EXAM: CT CHEST WITH CONTRAST TECHNIQUE: Multidetector CT imaging of the chest was performed  during intravenous contrast administration. CONTRAST:  64m OMNIPAQUE IOHEXOL 300 MG/ML  SOLN COMPARISON:  CT abdomen pelvis, 01/26/2021, CT chest, 08/11/2008 FINDINGS: Cardiovascular: No significant vascular findings. Normal heart size. No pericardial effusion. Mediastinum/Nodes: Prominent mediastinal lymph nodes, largest left hilar/AP window nodes measuring 1.8 x 1.2 cm (series 2, image 51). Moderate hiatal hernia with intrathoracic position of the gastric fundus. Status post thyroidectomy. Trachea and esophagus demonstrate no significant findings. Lungs/Pleura: There are multiple small bilateral pulmonary nodules, including a 5 mm nodule of the dependent right lower lobe (series 5, image 99), a 6 mm nodule of the posterior left upper lobe (series 5, image 52), a 6 mm nodule of the central left lower lobe (series 5, image 91) and a 5 mm nodule of the dependent left lower lobe (series 5, image 93). No pleural effusion or pneumothorax. Upper Abdomen: No acute abnormality. Esophagogastric tube, tip positioned in the duodenal bulb. Musculoskeletal: No chest wall mass  or suspicious bone lesions identified. IMPRESSION: 1. There are multiple small bilateral pulmonary nodules, measuring 6 mm and smaller, nonspecific although suspicious for pulmonary metastatic disease. These are new compared to very remote prior CT of the chest dated 08/11/2008 and likely below size threshold for confident PET-CT characterization. 2. Prominent mediastinal lymph nodes, similar in appearance to remote prior CT dated 08/11/2008 and likely benign and reactive. 3. Moderate hiatal hernia with intrathoracic position of the gastric fundus. 4. Esophagogastric tube, tip positioned in the duodenal bulb. Electronically Signed   By: AEddie CandleM.D.   On: 01/30/2021 20:44   DG Chest Port 1 View  Result Date: 01/26/2021 CLINICAL DATA:  Onset abdominal pain and vomiting this morning. EXAM: PORTABLE CHEST 1 VIEW COMPARISON:  PA and lateral chest 04/08/2014. FINDINGS: Lungs clear. Heart size normal. Hiatal hernia again seen. No pneumothorax or pleural fluid. No acute or focal bony abnormality. IMPRESSION: No acute disease. Hiatal hernia. Electronically Signed   By: TInge RiseM.D.   On: 01/26/2021 20:00   DG Abd Portable 1 View  Result Date: 01/26/2021 CLINICAL DATA:  Onset abdominal pain and vomiting this morning. EXAM: PORTABLE ABDOMEN - 1 VIEW COMPARISON:  None. FINDINGS: Gas-filled loops of small bowel are dilated up to approximately 4.3 cm. There is some gas in the ascending colon. No unexpected abdominal calcification is seen. No acute bony abnormality. IMPRESSION: Dilated loops of small bowel with gas in the colon worrisome for early or partial small bowel obstruction. Electronically Signed   By: TInge RiseM.D.   On: 01/26/2021 20:02   DG INTRO LONG GI TUBE  Result Date: 01/27/2021 CLINICAL DATA:  Small-bowel obstruction. Feeding tube for decompression. EXAM: FL FEEDING TUBE PLACEMENT CONTRAST:  50 mL of Omnipaque 300 through the NG tube FLUOROSCOPY TIME:  Fluoroscopy Time:  3 minutes  12 second Radiation Exposure Index (if provided by the fluoroscopic device): Number of Acquired Spot Images: 2 COMPARISON:  CT abdomen pelvis 01/26/2021 FINDINGS: Review of the prior CT reveals a large hiatal hernia and small-bowel obstruction. NG tube passes through the right nares after lubrication and anesthesia with lidocaine lubricant. NG tube placed into the hiatal hernia. It was difficult to enter the stomach below the hernia. Addition of the Amplatz wire and injection of contrast eventually allowed the tube to pass into the stomach. Final tube position is in the antrum of the stomach. IMPRESSION: Large hiatal hernia. Feeding tube placed with the tip in the antrum of the stomach. Electronically Signed   By: CFranchot GalloM.D.  On: 01/27/2021 10:20   ECHOCARDIOGRAM COMPLETE  Result Date: 01/31/2021    ECHOCARDIOGRAM REPORT   Patient Name:   Angelica [name] Date of Exam: 01/31/2021 Medical Rec #:  [id number]     Height:       63.0 in Accession #:    [id number]    Weight:       180.0 lb Date of Birth:  03-31-1941     BSA:          1.849 m Patient Age:    62 years      BP:           127/62 mmHg Patient Gender: F             HR:           81 bpm. Exam Location:  Inpatient Procedure: 2D Echo, Color Doppler and Cardiac Doppler Indications:    Pre op eval  History:        Patient has no prior history of Echocardiogram examinations.                 COPD; Risk Factors:Hypertension.  Sonographer:    Kandice Robinsons RDCS Referring Phys: [id number] [place]  Sonographer Comments: Patient is morbidly obese. Image acquisition challenging due to patient body habitus and Image acquisition challenging due to respiratory motion. IMPRESSIONS  1. Left ventricular ejection fraction, by estimation, is 55 to 60%. The left ventricle has normal function. The left ventricle has no regional wall motion abnormalities. Left ventricular diastolic parameters are indeterminate.  2. Right ventricular systolic function is low normal. The  right ventricular size is normal.  3. The mitral valve is abnormal. No evidence of mitral valve regurgitation.  4. The aortic valve is tricuspid. Aortic valve regurgitation is not visualized. Mild to moderate aortic valve sclerosis/calcification is present, without any evidence of aortic stenosis.  5. The inferior vena cava is normal in size with greater than 50% respiratory variability, suggesting right atrial pressure of 3 mmHg. FINDINGS  Left Ventricle: Left ventricular ejection fraction, by estimation, is 55 to 60%. The left ventricle has normal function. The left ventricle has no regional wall motion abnormalities. The left ventricular internal cavity size was normal in size. There is  no left ventricular hypertrophy. Left ventricular diastolic parameters are indeterminate. Right Ventricle: The right ventricular size is normal. Right vetricular wall thickness was not assessed. Right ventricular systolic function is low normal. Left Atrium: Left atrial size was normal in size. Right Atrium: Right atrial size was normal in size. Pericardium: There is no evidence of pericardial effusion. Mitral Valve: The mitral valve is abnormal. There is mild thickening of the mitral valve leaflet(s). Mild mitral annular calcification. No evidence of mitral valve regurgitation. MV peak gradient, 4.7 mmHg. The mean mitral valve gradient is 2.0 mmHg. Tricuspid Valve: The tricuspid valve is normal in structure. Tricuspid valve regurgitation is not demonstrated. Aortic Valve: The aortic valve is tricuspid. Aortic valve regurgitation is not visualized. Mild to moderate aortic valve sclerosis/calcification is present, without any evidence of aortic stenosis. Pulmonic Valve: The pulmonic valve was not well visualized. Pulmonic valve regurgitation is not visualized. Aorta: The aortic root is normal in size and structure. Venous: The inferior vena cava is normal in size with greater than 50% respiratory variability, suggesting right  atrial pressure of 3 mmHg. IAS/Shunts: The interatrial septum was not assessed.  LEFT VENTRICLE PLAX 2D LVIDd:         4.00 cm  Diastology LVIDs:         2.70 cm     LV e' medial:    4.90 cm/s LV PW:         0.90 cm     LV E/e' medial:  15.7 LV IVS:        0.90 cm     LV e' lateral:   5.22 cm/s LVOT diam:     1.80 cm     LV E/e' lateral: 14.8 LVOT Area:     2.54 cm  LV Volumes (MOD) LV vol d, MOD A2C: 29.3 ml LV vol d, MOD A4C: 34.8 ml LV vol s, MOD A2C: 13.5 ml LV vol s, MOD A4C: 18.3 ml LV SV MOD A2C:     15.8 ml LV SV MOD A4C:     34.8 ml LV SV MOD BP:      16.5 ml RIGHT VENTRICLE TAPSE (M-mode): 2.1 cm LEFT ATRIUM             Index LA diam:        2.30 cm 1.24 cm/m LA Vol (A2C):   8.8 ml  4.78 ml/m LA Vol (A4C):   48.1 ml 26.01 ml/m LA Biplane Vol: 22.0 ml 11.90 ml/m                        PULMONIC VALVE AORTA                 PV Vmax:       1.25 m/s Ao Root diam: 3.00 cm PV Vmean:      86.600 cm/s                       PV VTI:        0.190 m                       PV Peak grad:  6.2 mmHg                       PV Mean grad:  3.0 mmHg  MITRAL VALVE MV Area (PHT): 2.24 cm    SHUNTS MV Peak grad:  4.7 mmHg    Systemic Diam: 1.80 cm MV Mean grad:  2.0 mmHg MV Vmax:       1.08 m/s MV Vmean:      65.6 cm/s MV Decel Time: 338 msec MV E velocity: 77.10 cm/s MV A velocity: 87.50 cm/s MV E/A ratio:  0.88 Dorris Carnes MD Electronically signed by Dorris Carnes MD Signature Date/Time: 01/31/2021/5:43:22 PM    Final    [place] EKG SITE RITE  Result Date: 01/31/2021 If Site Rite image not attached, placement could not be confirmed due to current cardiac rhythm.   Microbiology: No results found for this or any previous visit (from the past 240 hour(s)).   Labs: Basic Metabolic Panel: Recent Labs  Lab 02/03/21 0358 02/04/21 0541 02/05/21 0356 02/06/21 0325 02/07/21 0325 02/08/21 0347  NA 135 135 134* 134* 136 136  K 5.0 4.3 4.4 4.3 4.1 4.2  CL 101 100 100 101 101 102  CO2 30 32 26 27 29 29   GLUCOSE 178* 146*  134* 143* 109* 93  BUN 18 21 20 22 19 15   CREATININE 0.58 0.63 0.52 0.55 0.61 0.62  CALCIUM 8.9 8.5* 8.6* 8.3* 8.3* 8.5*  MG 1.8 1.9 2.1 1.8 2.0  --   PHOS 2.5  4.4 3.9 4.1 4.1  --    Liver Function Tests: Recent Labs  Lab 02/03/21 0358 02/07/21 0325  AST 17 10*  ALT 15 10  ALKPHOS 40 41  BILITOT 0.4 0.5  PROT 6.0* 5.5*  ALBUMIN 3.1* 2.5*   No results for input(s): LIPASE, AMYLASE in the last 168 hours. No results for input(s): AMMONIA in the last 168 hours. CBC: Recent Labs  Lab 02/03/21 0358 02/04/21 0541 02/05/21 0356 02/07/21 0325 02/08/21 0347  WBC 12.6* 10.9* 11.1* 10.5 10.1  NEUTROABS 10.8*  --   --  5.8 5.0  HGB 12.0 11.0* 11.0* 9.5* 9.4*  HCT 37.2 34.3* 33.9* 30.3* 29.5*  MCV 87.3 87.9 88.5 89.4 88.6  PLT 236 208 238 293 293   Cardiac Enzymes: No results for input(s): CKTOTAL, CKMB, CKMBINDEX, TROPONINI in the last 168 hours. BNP: BNP (last 3 results) No results for input(s): BNP in the last 8760 hours.  ProBNP (last 3 results) No results for input(s): PROBNP in the last 8760 hours.  CBG: Recent Labs  Lab 02/07/21 0524 02/07/21 1122 02/07/21 1752 02/07/21 2215 02/08/21 0712  GLUCAP 113* 134* 155* 108* 90       Signed:  Irine Seal MD.  [facility] 02/08/2021, 10:58 AM

## 2021-02-08 NOTE — TOC Transition Note (Signed)
Transition of Care [facility]) - CM/SW Discharge Note   Patient Details  Name: MERIDEE BRANUM MRN: [id number] Date of Birth: 10/22/1940  [facility] CM/SW Contact:  Dessa Phi, RN Phone Number: 02/08/2021, 11:18 AM   Clinical Narrative: [facility] accepted for HHPT/OT. Family to transport home on own. No further CM needs.      Final next level of care: [place] Barriers to Discharge: No Barriers Identified   Patient Goals and CMS Choice Patient states their goals for this hospitalization and ongoing recovery are:: go home CMS Medicare.gov Compare Post Acute Care list provided to:: Patient Choice offered to / list presented to : Patient  Discharge Placement                       Discharge Plan and Services   Discharge Planning Services: CM Consult Post Acute Care Choice: Home Health                               Social Determinants of Health (SDOH) Interventions     Readmission Risk Interventions No flowsheet data found.

## 2021-02-08 NOTE — Progress Notes (Signed)
Right arm double lumen PICC removed per protocol per MD order. Manual pressure applied for 5 mins. No bleeding or swelling noted. Instructed patient to remain in bed for thirty mins. Educated patient about S/S of infection and when to call MD; no heavy lifting or pressure on right side for 24 hours; keep dressing dry and intact for 24 hours. Pt verbalized comprehension.

## 2021-02-08 NOTE — Telephone Encounter (Signed)
Received a staff from Dr. Burr Medico to sch Angelica [name] for a hosp f/u in 3-4 weeks. Angelica [name] has been scheduled to see Dr. Burr Medico on 6/15 at 3pm. Letter mailed to the pt.

## 2021-02-08 NOTE — Progress Notes (Signed)
Progress Note  6 Days Post-Op  Subjective: Some gas pains, but otherwise eating well and moving her bowels well.  No pain.  Not taking anything for pain.  Ready to go home.  Objective: Vital signs in last 24 hours: Temp:  [97.4 F (36.3 C)-98.6 F (37 C)] 98.5 F (36.9 C) (05/17 0504) Pulse Rate:  [82-86] 82 (05/17 0504) Resp:  [20] 20 (05/17 0504) BP: (120-125)/(71-75) 120/75 (05/17 0504) SpO2:  [97 %] 97 % (05/17 0504) Last BM Date: 02/07/21  Intake/Output from previous day: 05/16 0701 - 05/17 0700 In: 2423 [P.O.:240; I.V.:773; IV Piggyback:250] Out: -  Intake/Output this shift: No intake/output data recorded.  PE: Heart: regular, rate, and rhythm.  Lungs: CTAB Abd: soft,appropriately ttp, ND, incisions c/d/i with ecchymosis present, +BS  Lab Results:  Recent Labs    02/07/21 0325 02/08/21 0347  WBC 10.5 10.1  HGB 9.5* 9.4*  HCT 30.3* 29.5*  PLT 293 293   BMET Recent Labs    02/07/21 0325 02/08/21 0347  NA 136 136  K 4.1 4.2  CL 101 102  CO2 29 29  GLUCOSE 109* 93  BUN 19 15  CREATININE 0.61 0.62  CALCIUM 8.3* 8.5*   PT/INR No results for input(s): LABPROT, INR in the last 72 hours. CMP     Component Value Date/Time   NA 136 02/08/2021 0347   NA 141 11/10/2013 1449   K 4.2 02/08/2021 0347   K 3.7 11/10/2013 1449   CL 102 02/08/2021 0347   CO2 29 02/08/2021 0347   CO2 27 11/10/2013 1449   GLUCOSE 93 02/08/2021 0347   GLUCOSE 116 11/10/2013 1449   BUN 15 02/08/2021 0347   BUN 13.2 11/10/2013 1449   CREATININE 0.62 02/08/2021 0347   CREATININE 0.8 11/10/2013 1449   CALCIUM 8.5 (L) 02/08/2021 0347   CALCIUM 9.1 11/10/2013 1449   PROT 5.5 (L) 02/07/2021 0325   PROT 7.1 11/10/2013 1449   ALBUMIN 2.5 (L) 02/07/2021 0325   ALBUMIN 3.8 11/10/2013 1449   AST 10 (L) 02/07/2021 0325   AST 19 11/10/2013 1449   ALT 10 02/07/2021 0325   ALT 17 11/10/2013 1449   ALKPHOS 41 02/07/2021 0325   ALKPHOS 76 11/10/2013 1449   BILITOT 0.5 02/07/2021  0325   BILITOT 0.74 11/10/2013 1449   GFRNONAA >60 02/08/2021 0347   GFRAA >60 05/06/2020 1441   Lipase     Component Value Date/Time   LIPASE 35 01/26/2021 1830       Studies/Results: No results found.  Anti-infectives: Anti-infectives (From admission, onward)   Start     Dose/Rate Route Frequency Ordered Stop   02/02/21 1830  cefoTEtan (CEFOTAN) 2 g in sodium chloride 0.9 % 100 mL IVPB  Status:  Discontinued       Note to Pharmacy: Doesn't even recall if she is allergic to PCN   2 g 200 mL/hr over 30 Minutes Intravenous  Once 02/02/21 1742 02/02/21 1814   02/02/21 0600  ceFAZolin (ANCEF) 1 g in sodium chloride 0.9 % 100 mL IVPB  Status:  Discontinued       Note to Pharmacy: Patient states she doesn't even know if she is allergic to PCN   1 g 200 mL/hr over 30 Minutes Intravenous On call to O.R. 02/01/21 5361 02/01/21 1246   02/02/21 0600  cefoTEtan (CEFOTAN) 2 g in sodium chloride 0.9 % 100 mL IVPB       Note to Pharmacy: Doesn't even recall if she is allergic  to PCN   2 g 200 mL/hr over 30 Minutes Intravenous On call to O.R. 02/01/21 1246 02/02/21 1345       Assessment/Plan H/O thyroid cancer HTN COPD, prn 2L O2 at home Iron deficiency anemia -likely secondary to below, hgb 9.5 Severe protein calorie malnutrition - prealbumin 10.1, tolerating PO intake,  PO supplements   Partially obstructing ascending colon mass S/P laparoscopic right hemicolectomy 02/02/21 Dr. 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 Burr Medico saw her yesterday and will see her in the office in a couple of weeks. - patient mobilizing with PT/OT and hopeful to go home  - stable for discharge from a surgical perspective. D/w primary team  FEN - soft diet VTE -lovenox ID - cefotetanpre-op  LOS: 13 days    Angelica [name], [facility] 02/08/2021, 9:47  AM Please see Amion for pager number during day hours 7:00am-4:30pm

## 2021-02-08 NOTE — Plan of Care (Signed)
  Problem: Education: Goal: Knowledge of General Education information will improve Description: Including pain rating scale, medication(s)/side effects and non-pharmacologic comfort measures Outcome: Completed/Met   Problem: Activity: Goal: Risk for activity intolerance will decrease Outcome: Completed/Met   Problem: Nutrition: Goal: Adequate nutrition will be maintained Outcome: Completed/Met   Problem: Elimination: Goal: Will not experience complications related to bowel motility Outcome: Completed/Met Goal: Will not experience complications related to urinary retention Outcome: Completed/Met   Problem: Pain Managment: Goal: General experience of comfort will improve Outcome: Completed/Met   Problem: Safety: Goal: Ability to remain free from injury will improve Outcome: Completed/Met   Problem: Skin Integrity: Goal: Risk for impaired skin integrity will decrease Outcome: Completed/Met

## 2021-02-08 NOTE — Progress Notes (Addendum)
Nutrition Follow-up  DOCUMENTATION CODES:   Obesity unspecified  INTERVENTION:  - continue Ensure Plus BID, each supplement provides 350 kcal and 13 grams protein. - recommend continuing this supplement at home (retail version of Ensure Plus contains 16 grams protein/supplement).   NUTRITION DIAGNOSIS:   Inadequate oral intake related to acute illness as evidenced by per patient/family report. -ongoing, improving  GOAL:   Patient will meet greater than or equal to 90% of their needs -minimally met  MONITOR:   PO intake,Supplement acceptance,Labs,Weight trends  REASON FOR ASSESSMENT:   Consult Assessment of nutrition requirement/status  ASSESSMENT:   80 y.o. female with medical history of remote hx of thyroid cancer, celiac disease, rectal bleed, COPD, HTN, and iron deficiency anemia. She presented to the ED due to abdominal pain, bloating, and persistent N/V. She had a CT abdomen on 4/14 which showed abnormal thickening and luminal narrowing of the cecum and ascending colon with differential including carcinoid, IBD, or focal colitis. A colonoscopy with GI was planned for June however presented d/t worsening symptoms. Repeat CT now showing high-grade proximal large bowel obstruction involving the mid-ascending colon.  NGT removed on 5/13. Diet advanced to CLD on 5/14 at 0905, to Soft on 5/15 at 1000, and to [place] today at 0722.   On 5/14 she consumed 25% of lunch and 50% of dinner; on 5/15 she consumed 100% of breakfast; on 5/16 she consumed 100% of dinner; today she consumed 100% of a late breakfast.   Ensure Plus ordered BID starting yesterday and patient has accepted 2 of the 3 bottles offered.    Weight yesterday was documented as 196 lb which is significantly above weights 5/4-5/14. No information documented in the edema section of flow sheet.   Discharge order and discharge summary were entered earlier today for discharge home. PICC has been removed.   She has  been dx with stage 3 colorectal adenocarcinoma and underwent partial colectomy on 5/11.    Labs reviewed; CBGs: 90 and 138 mg/dl, Ca: 8.5 mg/dl. Medications reviewed; 1000 mcg oral cyanocobalamin/day, 1 mg folvite/day, sliding scale novolog, 125 mcg oral synthroid/day, 40 mg oral protonix/day.    Diet Order:   Diet Order            Diet Heart Room service appropriate? Yes; Fluid consistency: Thin  Diet effective now           Diet - low sodium heart healthy                 EDUCATION NEEDS:   No education needs have been identified at this time  Skin:  Skin Assessment: Reviewed RN Assessment  Last BM:  5/17 (type 6 x1; type 7 x1)  Height:   Ht Readings from Last 1 Encounters:  01/26/21 5' 3" (1.6 m)    Weight:   Wt Readings from Last 1 Encounters:  02/07/21 88.9 kg    Estimated Nutritional Needs:  Kcal:  1900-2100 kcal Protein:  90-100 grams Fluid:  >/= 2 L/day      Jarome Matin, MS, RD, LDN, CNSC Inpatient Clinical Dietitian RD pager # available in AMION  After hours/weekend pager # available in [facility]

## 2021-02-08 NOTE — Discharge Instructions (Signed)
[facility], [place] [phone]  OPEN ABDOMINAL SURGERY: POST OP INSTRUCTIONS  Always review your discharge instruction sheet given to you by the facility where your surgery was performed.  IF YOU HAVE DISABILITY OR FAMILY LEAVE FORMS, YOU MUST BRING THEM TO THE OFFICE FOR PROCESSING.  PLEASE DO NOT GIVE THEM TO YOUR DOCTOR.  1. A prescription for pain medication may be given to you upon discharge.  Take your pain medication as prescribed, if needed.  If narcotic pain medicine is not needed, then you may take acetaminophen (Tylenol) or ibuprofen (Advil) as needed. 2. Take your usually prescribed medications unless otherwise directed. 3. If you need a refill on your pain medication, please contact your pharmacy. They will contact our office to request authorization.  Prescriptions will not be filled after 5pm or on week-ends. 4. You should follow a light diet the first few days after arrival home, such as soup and crackers, pudding, etc.unless your doctor has advised otherwise. A high-fiber, low fat diet can be resumed as tolerated.   Be sure to include lots of fluids daily. Most patients will experience some swelling and bruising on the chest and neck area.  Ice packs will help.  Swelling and bruising can take several days to resolve 5. Most patients will experience some swelling and bruising in the area of the incision. Ice pack will help. Swelling and bruising can take several days to resolve..  6. It is common to experience some constipation if taking pain medication after surgery.  Increasing fluid intake and taking a stool softener will usually help or prevent this problem from occurring.  A mild laxative (Milk of Magnesia or Miralax) should be taken according to package directions if there are no bowel movements after 48 hours. 7.  You may have steri-strips (small skin tapes) in place directly over the incision.  These strips should be left on the skin for 7-10 days.  If your  surgeon used skin glue on the incision, you may shower in 24 hours.  The glue will flake off over the next 2-3 weeks.  Any sutures or staples will be removed at the office during your follow-up visit. You may find that a light gauze bandage over your incision may keep your staples from being rubbed or pulled. You may shower and replace the bandage daily. 8. ACTIVITIES:  You may resume regular (light) daily activities beginning the next day--such as daily self-care, walking, climbing stairs--gradually increasing activities as tolerated.  You may have sexual intercourse when it is comfortable.  Refrain from any heavy lifting or straining until approved by your doctor. a. You may drive when you no longer are taking prescription pain medication, you can comfortably wear a seatbelt, and you can safely maneuver your car and apply brakes  9. You should see your doctor in the office for a follow-up appointment approximately two weeks after your surgery.  Make sure that you call for this appointment within a day or two after you arrive home to insure a convenient appointment time.   WHEN TO CALL YOUR DOCTOR: 1. Fever over 101.0 2. Inability to urinate 3. Nausea and/or vomiting 4. Extreme swelling or bruising 5. Continued bleeding from incision. 6. Increased pain, redness, or drainage from the incision. 7. Difficulty swallowing or breathing 8. Muscle cramping or spasms. 9. Numbness or tingling in hands or feet or around lips.  The clinic staff is available to answer your questions during regular business hours.  Please don't hesitate to call and ask to speak to one of the nurses if you have concerns.  For further questions, please visit [email or web address]        [facility] Stay Proper nutrition can help your body recover from illness and injury.   Foods and beverages high in protein, vitamins, and minerals help rebuild muscle loss, promote healing, & reduce fall risk.    .In addition to eating healthy foods, a nutrition shake is an easy, delicious way to get the nutrition you need during and after your hospital stay  It is recommended that you continue to drink 2 bottles per day of: Ensure Plus for at least 1 month (30 days) after your hospital stay   Tips for adding a nutrition shake into your routine: As allowed, drink one with vitamins or medications instead of water or juice Enjoy one as a tasty mid-morning or afternoon snack Drink cold or make a milkshake out of it Drink one instead of milk with cereal or snacks Use as a coffee creamer   Available at the following grocery stores and pharmacies:           * [place] [phone]            For COUPONS visit: www.ensure.com/join or http://dawson-may.com/   Suggested Substitutions Ensure Plus = Boost Plus = Carnation Breakfast Essentials = Boost Compact Ensure Active Clear = Boost Breeze Glucerna Shake = Boost Glucose Control = Carnation Breakfast Essentials SUGAR FREE

## 2021-02-14 LAB — SURGICAL PATHOLOGY

## 2021-02-15 ENCOUNTER — Ambulatory Visit: Payer: Medicare Other

## 2021-02-15 ENCOUNTER — Other Ambulatory Visit: Payer: Medicare Other

## 2021-02-15 ENCOUNTER — Ambulatory Visit: Payer: Medicare Other | Admitting: Hematology and Oncology

## 2021-02-16 ENCOUNTER — Other Ambulatory Visit: Payer: Self-pay

## 2021-02-16 NOTE — Progress Notes (Signed)
The proposed treatment discussed in conference is for discussion purposes only and is not a binding recommendation.  The patients have not been physically examined, or presented with their treatment options.  Therefore, final treatment plans cannot be decided.

## 2021-02-28 DIAGNOSIS — C189 Malignant neoplasm of colon, unspecified: Secondary | ICD-10-CM | POA: Diagnosis not present

## 2021-02-28 DIAGNOSIS — E039 Hypothyroidism, unspecified: Secondary | ICD-10-CM | POA: Diagnosis not present

## 2021-02-28 DIAGNOSIS — R739 Hyperglycemia, unspecified: Secondary | ICD-10-CM | POA: Diagnosis not present

## 2021-02-28 DIAGNOSIS — R6510 Systemic inflammatory response syndrome (SIRS) of non-infectious origin without acute organ dysfunction: Secondary | ICD-10-CM | POA: Diagnosis not present

## 2021-02-28 DIAGNOSIS — K56609 Unspecified intestinal obstruction, unspecified as to partial versus complete obstruction: Secondary | ICD-10-CM | POA: Diagnosis not present

## 2021-02-28 DIAGNOSIS — E876 Hypokalemia: Secondary | ICD-10-CM | POA: Diagnosis not present

## 2021-02-28 DIAGNOSIS — D509 Iron deficiency anemia, unspecified: Secondary | ICD-10-CM | POA: Diagnosis not present

## 2021-02-28 DIAGNOSIS — R918 Other nonspecific abnormal finding of lung field: Secondary | ICD-10-CM | POA: Diagnosis not present

## 2021-02-28 DIAGNOSIS — I959 Hypotension, unspecified: Secondary | ICD-10-CM | POA: Diagnosis not present

## 2021-02-28 DIAGNOSIS — E538 Deficiency of other specified B group vitamins: Secondary | ICD-10-CM | POA: Diagnosis not present

## 2021-02-28 DIAGNOSIS — J449 Chronic obstructive pulmonary disease, unspecified: Secondary | ICD-10-CM | POA: Diagnosis not present

## 2021-02-28 DIAGNOSIS — E1169 Type 2 diabetes mellitus with other specified complication: Secondary | ICD-10-CM | POA: Diagnosis not present

## 2021-03-04 NOTE — Progress Notes (Signed)
[facility]   [phone] [phone]   Clinic Follow up Note   Patient Care Team: Angelica Hatchet, MD as PCP - General (Internal Medicine) Angelica Klein, MD as Consulting Physician (General Surgery) Angelica Juniper, MD as Consulting Physician (Gastroenterology) Angelica Lark, MD as Consulting Physician (Hematology and Oncology) Angelica Merle, MD as Consulting Physician (Hematology and Oncology) Angelica Finner, RN as Oncology Nurse Navigator  Date of Service:  03/09/2021  CHIEF COMPLAINT: f/u of colon cancer  SUMMARY OF ONCOLOGIC HISTORY: Oncology History Overview Note  Cancer Staging Cancer of ascending colon pT3, pN2b s/p lap proximal colectomy 02/02/2021 Staging form: Colon and Rectum, AJCC 8th Edition - Pathologic stage from 02/02/2021: Stage IIIC (pT3, pN2b, cM0) - Signed by Angelica Merle, MD on 03/09/2021 Stage prefix: Initial diagnosis Histologic grading system: 4 grade system Histologic grade (G): G2 Residual tumor (R): R0 - None    Cancer of ascending colon pT3, pN2b s/p lap proximal colectomy 02/02/2021   Initial Diagnosis   Cancer of ascending colon pT3, pN2b s/p lap proximal colectomy 02/02/2021   01/06/2021 Imaging   CT AP  IMPRESSION: 1. Abnormal thickening and luminal narrowing involving the cecum and ascending colon up to roughly the level of the mid ascending colon. There also appears to be contiguous thickening in the terminal ileum. Differential considerations include carcinoma, inflammatory bowel disease or focal colitis also involving the terminal ileum. No evidence of associated small bowel obstruction, abscess or bowel perforation. Correlation with evidence of blood in the stool is recommended as well as direct visualization with colonoscopy. 2. Stable large hiatal hernia. 3. Aortic atherosclerosis without evidence of aneurysm.   Aortic Atherosclerosis (ICD10-I70.0).   01/26/2021 Imaging   IMPRESSION: High-grade proximal large  bowel obstruction involving the mid ascending colon. 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   Mild right nonobstructing nephrolithiasis.   Large hiatal hernia   Aortic Atherosclerosis (ICD10-I70.0).     01/29/2021 Procedure   Colonoscopy by Angelica [name]  IMPRESSION - Preparation of the colon was poor. - Likely malignant completely obstructing tumor in the distal ascending colon. Biopsied. Tattooed. - Stool in the entire examined colon. - Diverticulosis in the sigmoid colon and in the descending colon.   01/29/2021 Initial Biopsy   FINAL MICROSCOPIC DIAGNOSIS:   A. ASCENDING COLON BIOPSY:  - Moderately differentiated colorectal adenocarcinoma.  - See comment.   COMMENT:  Angelica. Melina [name] agrees.      01/30/2021 Imaging   CT Chest  IMPRESSION: 1. There are multiple small bilateral pulmonary nodules, measuring 6 mm and smaller, nonspecific although suspicious for pulmonary metastatic disease. These are new compared to very remote prior CT of the chest dated 08/11/2008 and likely below size threshold for confident PET-CT characterization. 2. Prominent mediastinal lymph nodes, similar in appearance to remote prior CT dated 08/11/2008 and likely benign and reactive. 3. Moderate hiatal hernia with intrathoracic position of the gastric fundus. 4. Esophagogastric tube, tip positioned in the duodenal bulb.   02/02/2021 Surgery   Laparoscopic Assisted Partial Colectomy by Angelica [name]    02/02/2021 Pathology Results   FINAL MICROSCOPIC DIAGNOSIS:   A. COLON, RIGHT, RESECTION:  - Invasive colorectal adenocarcinoma, 5.2 cm.  - Carcinoma extends into pericolonic connective tissue.  - Margins not involved.  - Metastatic carcinoma in seven of twenty eight lymph nodes (7/28).  - One extramural satellite nodule.  -  See oncology table.   MSI -Stable   Mismatch repair normal   Preserved nuclear expressoin of MLH1, MSHS2, MSH6, PMS2    02/02/2021 Cancer Staging   Staging form: Colon and Rectum, AJCC 8th Edition - Pathologic stage from 02/02/2021: Stage IIIC (pT3, pN2b, cM0) - Signed by Angelica Merle, MD on 03/09/2021  Stage prefix: Initial diagnosis  Histologic grading system: 4 grade system  Histologic grade (G): G2  Residual tumor (R): R0 - None    03/28/2021 -  Chemotherapy   PENDING Adjuvant Xeloda 1082m in the AM and 1509min the PM 2 weeks on/1 week off for 6 months starting 03/28/21.           CURRENT THERAPY:  PENDING Adjuvant Xeloda 100053mn the AM and 1500m3m the PM 2 weeks on/1 week off for 6 months starting 03/28/21.   INTERVAL HISTORY:  Angelica [name] for a follow up. She presents to the clinic with her niece Angelica [name] Skyee lives in [place]. She notes her friend MaryStanton [name] her 2 older sisters also live in [place] notes both of her children and her husband have passed away. She does not have grandchildren. She notes lives alone. She notes since her surgery, she was visited by her sisters. She did f/u with Angelica ByerBarry Dienest week and was told she is halfway healed, because the inside needs to heal. She notes her BM is frequent and she has gas. Her stool is soft. She notes her energy is still low given leg weakness since being in hospitalization. She notes this has lead to left knee pain and lower back pain. She was seen by Orthopedist before and after many rounds of injections, there was nothing else he could do. She notes she mostly uses walker. She is able to do lighter housework and gets help with her food. She can do other ADLs. She   REVIEW OF SYSTEMS:    Constitutional: Denies fevers, chills or abnormal weight loss (+) Fatigue  Eyes: Denies blurriness of vision Ears, nose, mouth, throat, and face: Denies mucositis or sore throat Respiratory: Denies cough, dyspnea or  wheezes Cardiovascular: Denies palpitation, chest discomfort or lower extremity swelling Gastrointestinal:  Denies nausea, heartburn (+) More frequent BM (+) Gas  Skin: Denies abnormal skin rashes Lymphatics: Denies new lymphadenopathy or easy bruising MSK :(+) left knee pain (+) Lower back pain  Neurological:Denies numbness, tingling or new weaknesses (+) Leg weakness  Behavioral/Psych: Mood is stable, no new changes  All other systems were reviewed with the patient and are negative.  MEDICAL HISTORY:  Past Medical History:  Diagnosis Date   Anemia    Arthritis    Asthma    Celiac disease 11/17/2013   COPD (chronic obstructive pulmonary disease) (HCC)[place] on 2L prn home O2   Hypertension    Rectal bleed 10/23/2018   Thyroid cancer (HCC)[place] remote    SURGICAL HISTORY: Past Surgical History:  Procedure Laterality Date   ABDOMINAL HYSTERECTOMY     BIOPSY  01/29/2021   Procedure: BIOPSY;  Surgeon: KarkRonnette [name];  [facility];  Service: Gastroenterology;;   BREAST EXCISIONAL BIOPSY Bilateral    CHOLECYSTECTOMY     COLONOSCOPY  07/05/2012   Procedure: COLONOSCOPY;  Surgeon: JameWinfield CunasD;  [facility];  Service: Endoscopy;  Laterality: N/A;   COLONOSCOPY N/A 03/12/2015   Procedure: COLONOSCOPY;  Surgeon: JameLaurence Spates;  Location: [facility];  Service: Endoscopy;  Laterality: N/A;   COLONOSCOPY N/A 01/29/2021   Procedure: COLONOSCOPY;  Surgeon: Angelica Juniper, MD;  Location[facility];  Service: Gastroenterology;  Laterality: N/A;   ESOPHAGOGASTRODUODENOSCOPY  07/05/2012   Procedure: ESOPHAGOGASTRODUODENOSCOPY (EGD);  Surgeon: Winfield Cunas., MD;  [facility];  Service: Endoscopy;  Laterality: N/A;   HOT HEMOSTASIS N/A 03/12/2015   Procedure: HOT HEMOSTASIS (ARGON PLASMA COAGULATION/BICAP);  Surgeon: Laurence Spates, MD;  [facility];  Service: Endoscopy;  Laterality: N/A;   LAPAROSCOPIC PARTIAL COLECTOMY N/A 02/02/2021    Procedure: LAPAROSCOPIC ASSISTED PARTIAL COLECTOMY;  Surgeon: Angelica Klein, MD;  Location: [facility]S;  Service: General;  Laterality: N/A;   SUBMUCOSAL TATTOO INJECTION  01/29/2021   Procedure: SUBMUCOSAL TATTOO INJECTION;  Surgeon: Angelica Juniper, MD;  [facility];  Service: Gastroenterology;;   TONSILLECTOMY      I have reviewed the social history and family history with the patient and they are unchanged from previous note.  ALLERGIES:  is allergic to other, fosamax [alendronate sodium], penicillins, sulfa antibiotics, and codeine.  MEDICATIONS:  Current Outpatient Medications  Medication Sig Dispense Refill   capecitabine (XELODA) 500 MG tablet Take 2 tabs in morning and 3 tabs in evening for 7 days then off for 7 days. Start on 03/28/2021 70 tablet 0   acetaminophen (TYLENOL) 325 MG tablet Take 2 tablets (650 mg total) by mouth every 6 (six) hours as needed for mild pain or fever.     albuterol (PROVENTIL HFA;VENTOLIN HFA) 108 (90 Base) MCG/ACT inhaler Inhale into the lungs every 6 (six) hours as needed for wheezing or shortness of breath.     docusate sodium (COLACE) 250 MG capsule Take 250 mg by mouth daily as needed for constipation.     folic acid (FOLVITE) 1 MG tablet Take 1 tablet (1 mg total) by mouth daily. 30 tablet 1   hydrocortisone (ANUSOL-HC) 25 MG suppository Place 1 suppository (25 mg total) rectally 2 (two) times daily as needed for hemorrhoids or anal itching. (Patient not taking: Reported on 01/27/2021) 12 suppository 0   levothyroxine (SYNTHROID, LEVOTHROID) 125 MCG tablet Take 125 mcg by mouth daily.     ondansetron (ZOFRAN ODT) 8 MG disintegrating tablet Take 1 tablet (8 mg total) by mouth every 8 (eight) hours as needed. 8 tablet 0   OXYGEN Inhale 2 L into the lungs as needed (LOW OXYGEN).     pantoprazole (PROTONIX) 40 MG tablet Take 1 tablet (40 mg total) by mouth daily. 30 tablet 0   polyethylene glycol (MIRALAX) 17 g packet Take 17 g by mouth daily as needed for  mild constipation. 14 each 0   traMADol (ULTRAM) 50 MG tablet Take 1 tablet (50 mg total) by mouth every 6 (six) hours as needed for moderate pain or severe pain (50 mg for moderate, 100 mg for severe). 10 tablet 0   No current facility-administered medications for this visit.    PHYSICAL EXAMINATION: ECOG PERFORMANCE STATUS: 3 - Symptomatic, >50% confined to bed  Vitals:   03/09/21 1455  BP: 120/73  Pulse: 76  Resp: 17  Temp: 97.7 F (36.5 C)  SpO2: 97%   Filed Weights   03/09/21 1455  Weight: 183 lb 4.8 oz (83.1 kg)    GENERAL:alert, no distress and comfortable SKIN: skin color, texture, turgor are normal, no rashes or significant lesions EYES: normal, Conjunctiva are pink and non-injected, sclera clear  NECK: supple, thyroid normal size, non-tender, without nodularity LYMPH:  no palpable lymphadenopathy in the cervical, axillary  LUNGS: clear to auscultation and percussion with normal breathing effort HEART: regular rate & rhythm and no murmurs and no lower extremity edema ABDOMEN:abdomen soft, non-tender and normal bowel sounds (+) Superficial Abdominal incisions healed well Musculoskeletal:no cyanosis of digits and no clubbing  NEURO: alert & oriented x 3 with fluent speech, no focal motor/sensory deficits  LABORATORY DATA:  I have reviewed the data as listed CBC Latest Ref Rng & Units 03/09/2021 02/08/2021 02/07/2021  WBC 4.0 - 10.5 K/uL 6.9 10.1 10.5  Hemoglobin 12.0 - 15.0 g/dL 12.3 9.4(L) 9.5(L)  Hematocrit 36.0 - 46.0 % 38.7 29.5(L) 30.3(L)  Platelets 150 - 400 K/uL 239 293 293     CMP Latest Ref Rng & Units 03/09/2021 02/08/2021 02/07/2021  Glucose 70 - 99 mg/dL 106(H) 93 109(H)  BUN 8 - 23 mg/dL _0 Creatinine 0.44 - 1.00 mg/dL 0.86 0.62 0.61  Sodium 135 - 145 mmol/L 143 136 136  Potassium 3.5 - 5.1 mmol/L 3.1(L) 4.2 4.1  Chloride 98 - 111 mmol/L 104 102 101  CO2 22 - 32 mmol/L _1 Calcium 8.9 - 10.3 mg/dL 9.4 8.5(L) 8.3(L)  Total Protein 6.5 -  8.1 g/dL 6.9 - 5.5(L)  Total Bilirubin 0.3 - 1.2 mg/dL 1.1 - 0.5  Alkaline Phos 38 - 126 U/L 56 - 41  AST 15 - 41 U/L 18 - 10(L)  ALT 0 - 44 U/L 12 - 10      RADIOGRAPHIC STUDIES: I have personally reviewed the radiological images as listed and agreed with the findings in the report. No results found.   ASSESSMENT & PLAN:  Angelica [name] is a 80 y.o. female with    1.Colon adenocarcinoma, pT3N2bM0, stage IIIC -She was seen to have obstructive colon mass on 01/06/21 CT AP. Her 01/29/21 Colonoscopy showed this to be Moderately differentiated colorectal adenocarcinoma. Staging CT chest showed nonspecific although suspicious for pulmonary metastatic disease.  -She underwent Partial colectomy on 02/02/21 with Angelica [name]. Surgical path showed 5.2cm of Invasive colorectal adenocarcinoma was completely removed with clear margins. She did have 7/28 positive lymph nodes.  -I discussed with this much LN involvement, her cancer was locally advanced and she has higher risk of recurrence (>50%). I discussed reducing this risk of recurrence with systemic chemotherapy. Given her age and tolerance, I recommend less intensive chemo with oral Xeloda for 6 months. I would start her at lower dose 1046m in the AM and 15051min the PM one week on/one week off. May increase to 150044mID if tolerable. She can dissolve this given her issues with swallowing pills. I highly recommend she use pill box.   --Chemotherapy consent: Side effects including but does not limited to, fatigue, nausea, vomiting, diarrhea, hair loss, neuropathy, fluid retention, renal and [name] dysfunction, neutropenic fever, needed for blood transfusion, bleeding, were discussed with patient in great detail. She agrees to proceed. Plan to start on 03/28/21.  -The goal of therapy is curative -Patient is concerned about potential side effect, may want stop if she has significant side effects. -Her surgical incision healed well superficially. She has mild  increased BM frequency. Her energy is low to fair and eating overall adequate. Will monitor on chemo.  -F/u on 7/14 and 7/15. I recommend she contact me sooner if needed.    2.  Chronic iron deficiency anemia -She has been treated with B12 injections and IV Iron before managed by Angelica GorAlvy Bimlertopped in 08/2020. Pt notes low iron absorption with oral  iron.  -With her Colon cancer, she had GI blood loss which lead to worsened anemia. After colon surgery, her Hg is 9.4, but now resolved today (03/09/21) -I recommend she take Oral B12 and Prenatal vitamin.    3.  Small pulmonary nodules noted on CT scan, too small to characterize.  -Will repeat scan in 04/2021.    4. Comorbidities: HTN, COPD, Hypothyroidism with remote history of thyroid cancer, Arthritis  -She has chronic left knee pain and use to have repeated injections. She notes since long hospitalization she has left leg pain and weakness that radiates to her lower back.    5. Social Support  -Her husband and 2 children passed away years ago. She lives alone but has help form her 2 older sisters and her niece Quita Skye and Friend. Her niece will be her first point of contact.  -She notes she lives off social security and has insurance with [employer]. We will find financial assistance if needed.    PLAN:  -I called in Xeloda today. Plan to start 03/28/21. She will take 1000 mg in the morning and 1500 mg in the evening, 7 days on and 7 days off -Lab and F/u on 7/14 or 7/15   No problem-specific Assessment & Plan notes found for this encounter.   No orders of the defined types were placed in this encounter.  All questions were answered. The patient knows to call the clinic with any problems, questions or concerns. No barriers to learning was detected. The total time spent in the appointment was 40 minutes.     Angelica Merle, MD 03/09/2021   I, Joslyn Devon, am acting as scribe for Angelica Merle, MD.   I have reviewed the above documentation for  accuracy and completeness, and I agree with the above.

## 2021-03-08 ENCOUNTER — Other Ambulatory Visit: Payer: Self-pay

## 2021-03-08 DIAGNOSIS — C182 Malignant neoplasm of ascending colon: Secondary | ICD-10-CM

## 2021-03-08 NOTE — Progress Notes (Signed)
Spoke with patient regarding her appointment tomorrow at 3:00 pm with Dr. Burr Medico.  I asked that she come earlier for lab work at 2:30.  She knows she can bring one person with her to the appointment.  She also states she will not do IV chemotherapy but will consider the pills.  Patient is aware of our location and that she can bring one person with her.

## 2021-03-09 ENCOUNTER — Other Ambulatory Visit: Payer: Self-pay | Admitting: Hematology and Oncology

## 2021-03-09 ENCOUNTER — Inpatient Hospital Stay: Payer: Medicare Other

## 2021-03-09 ENCOUNTER — Telehealth: Payer: Self-pay | Admitting: Hematology

## 2021-03-09 ENCOUNTER — Inpatient Hospital Stay: Payer: Medicare Other | Attending: Hematology and Oncology | Admitting: Hematology

## 2021-03-09 ENCOUNTER — Encounter: Payer: Self-pay | Admitting: Hematology

## 2021-03-09 ENCOUNTER — Other Ambulatory Visit: Payer: Self-pay

## 2021-03-09 ENCOUNTER — Telehealth: Payer: Self-pay | Admitting: Pharmacist

## 2021-03-09 ENCOUNTER — Other Ambulatory Visit (HOSPITAL_COMMUNITY): Payer: Self-pay

## 2021-03-09 VITALS — BP 120/73 | HR 76 | Temp 97.7°F | Resp 17 | Ht 63.0 in | Wt 183.3 lb

## 2021-03-09 DIAGNOSIS — J449 Chronic obstructive pulmonary disease, unspecified: Secondary | ICD-10-CM | POA: Diagnosis not present

## 2021-03-09 DIAGNOSIS — D509 Iron deficiency anemia, unspecified: Secondary | ICD-10-CM

## 2021-03-09 DIAGNOSIS — E039 Hypothyroidism, unspecified: Secondary | ICD-10-CM | POA: Diagnosis not present

## 2021-03-09 DIAGNOSIS — Z85850 Personal history of malignant neoplasm of thyroid: Secondary | ICD-10-CM | POA: Insufficient documentation

## 2021-03-09 DIAGNOSIS — Z9049 Acquired absence of other specified parts of digestive tract: Secondary | ICD-10-CM | POA: Insufficient documentation

## 2021-03-09 DIAGNOSIS — C182 Malignant neoplasm of ascending colon: Secondary | ICD-10-CM | POA: Diagnosis not present

## 2021-03-09 DIAGNOSIS — Z79899 Other long term (current) drug therapy: Secondary | ICD-10-CM | POA: Insufficient documentation

## 2021-03-09 DIAGNOSIS — I10 Essential (primary) hypertension: Secondary | ICD-10-CM | POA: Diagnosis not present

## 2021-03-09 LAB — CBC WITH DIFFERENTIAL (CANCER CENTER ONLY)
Abs Immature Granulocytes: 0.01 10*3/uL (ref 0.00–0.07)
Basophils Absolute: 0.1 10*3/uL (ref 0.0–0.1)
Basophils Relative: 1 %
Eosinophils Absolute: 0.2 10*3/uL (ref 0.0–0.5)
Eosinophils Relative: 3 %
HCT: 38.7 % (ref 36.0–46.0)
Hemoglobin: 12.3 g/dL (ref 12.0–15.0)
Immature Granulocytes: 0 %
Lymphocytes Relative: 35 %
Lymphs Abs: 2.4 10*3/uL (ref 0.7–4.0)
MCH: 28.5 pg (ref 26.0–34.0)
MCHC: 31.8 g/dL (ref 30.0–36.0)
MCV: 89.8 fL (ref 80.0–100.0)
Monocytes Absolute: 0.7 10*3/uL (ref 0.1–1.0)
Monocytes Relative: 9 %
Neutro Abs: 3.5 10*3/uL (ref 1.7–7.7)
Neutrophils Relative %: 52 %
Platelet Count: 239 10*3/uL (ref 150–400)
RBC: 4.31 MIL/uL (ref 3.87–5.11)
RDW: 15 % (ref 11.5–15.5)
WBC Count: 6.9 10*3/uL (ref 4.0–10.5)
nRBC: 0 % (ref 0.0–0.2)

## 2021-03-09 LAB — CMP (CANCER CENTER ONLY)
ALT: 12 U/L (ref 0–44)
AST: 18 U/L (ref 15–41)
Albumin: 3.7 g/dL (ref 3.5–5.0)
Alkaline Phosphatase: 56 U/L (ref 38–126)
Anion gap: 9 (ref 5–15)
BUN: 12 mg/dL (ref 8–23)
CO2: 30 mmol/L (ref 22–32)
Calcium: 9.4 mg/dL (ref 8.9–10.3)
Chloride: 104 mmol/L (ref 98–111)
Creatinine: 0.86 mg/dL (ref 0.44–1.00)
GFR, Estimated: >60 mL/min (ref >60)
Glucose, Bld: 106 mg/dL — ABNORMAL HIGH (ref 70–99)
Potassium: 3.1 mmol/L — ABNORMAL LOW (ref 3.5–5.1)
Sodium: 143 mmol/L (ref 135–145)
Total Bilirubin: 1.1 mg/dL (ref 0.3–1.2)
Total Protein: 6.9 g/dL (ref 6.5–8.1)

## 2021-03-09 LAB — FERRITIN: Ferritin: 105 ng/mL (ref 11–307)

## 2021-03-09 LAB — IRON AND TIBC
Iron: 57 ug/dL (ref 41–142)
Saturation Ratios: 22 % (ref 21–57)
TIBC: 264 ug/dL (ref 236–444)
UIBC: 207 ug/dL (ref 120–384)

## 2021-03-09 MED ORDER — CAPECITABINE 500 MG PO TABS
ORAL_TABLET | ORAL | 0 refills | Status: DC
  Filled 2021-03-09: qty 70, fill #0

## 2021-03-09 NOTE — Telephone Encounter (Signed)
Scheduled per los. Gave avs and calendar  

## 2021-03-10 ENCOUNTER — Telehealth: Payer: Self-pay

## 2021-03-10 ENCOUNTER — Other Ambulatory Visit (HOSPITAL_COMMUNITY): Payer: Self-pay

## 2021-03-10 NOTE — Progress Notes (Signed)
Met with patient and her niece Quita Skye today at hospital follow up with Dr. Truitt Merle in medical oncology.  I had previously spoken with the patient on the phone but introduced myself and explained my role as nurse navigator.  She was given my direct contact information.  The patient stated that Dr. Barry Dienes told her she was halfway there with her healing from hr colon surgery.  She has been receiving PT at home to help with strengthening and ambulation.  She states she does get wobbly and dizzy sometimes.  I reviewed her current hydration with her which mainly consists of iced sweet tea and Dr. Samson Frederic.  I explained that these are diuretics and pull fluid off of her body and really do nothing to hydrate her.  I have encouraged her to at least substitute every other beverage with a bottle of water.  She states she will try this.  She has good family and friend support who have been coming in and bringing her food.  She states that she will not do traditional IV chemotherapy but will consider the pills that Dr. Burr Medico spoke with her about while in the hospital.  Her niece is currently providing transportation to and from the [place].

## 2021-03-10 NOTE — Telephone Encounter (Signed)
Oral Oncology Pharmacist Encounter  Received new prescription for Xeloda (capecitabine) for the adjuvant treatment of stage IIIC colon cancer, planned duration 6 months.  Prescription dose and frequency assessed for appropriateness. Per MD dose may be increased if patient tolerates first cycle. Plan for patient to start first cycle 03/28/21.  CMP and CBC w/ Diff from 03/09/21 assessed, labs overall stable for treatment initiation in July.   Current medication list in Epic reviewed, DDIs with Xeloda identified: Category C DDI between Xeloda and Pantoprazole - proton-pump inhibitors can decrease efficacy of Xeloda - will confirm if patient is still taking PPI, if so will discuss with patient alternatives to pantoprazole, such as H2RA's like famotidine while on Xeloda. Category C DDI between Xeloda and Folic acid - folic acid may increase risk for ADEs of fluorouracil products like Xeloda. Will discuss with patient monitoring for increased ADEs including but not limited to diarrhea, mucositis while on Xeloda if she is still taking folic acid.   Evaluated chart and no patient barriers to medication adherence noted.   Patient agreement for treatment documented in MD note on 03/09/21.  Prescription has been e-scribed to the [facility] for benefits analysis and approval.  Oral Oncology Clinic will continue to follow for insurance authorization, copayment issues, initial counseling and start date.  Leron Croak, PharmD, BCPS Hematology/Oncology Clinical Pharmacist [facility] [phone] 03/10/2021 8:24 AM

## 2021-03-10 NOTE — Telephone Encounter (Signed)
Oral Oncology Patient Advocate Encounter  After completing a benefits investigation, prior authorization for Xeloda is not required at this time through Medicare b.  Patient's copay is $15.96  [facility] [place] Phone [phone] Fax [phone] 03/10/2021 10:12 AM

## 2021-03-15 ENCOUNTER — Telehealth: Payer: Self-pay | Admitting: *Deleted

## 2021-03-15 NOTE — Telephone Encounter (Signed)
Called pt to inform of low K+ & need for K-rich foods & supplement for 7 days.  She wants to try foods first b/c she reports have difficulty swallowing K+.  K-rich foods discussed & she will try diet first.

## 2021-03-15 NOTE — Telephone Encounter (Signed)
-----   Message from Truitt Merle, MD sent at 03/13/2021 10:49 PM EDT ----- Please let pt know her K level was low and I recommend K-rich food, and please call in KCL 40mq daily for 7 days for her if she is willing to take, thanks   YTruitt Merle 03/13/2021

## 2021-03-18 ENCOUNTER — Inpatient Hospital Stay: Payer: Medicare Other

## 2021-03-18 NOTE — Progress Notes (Signed)
Nutrition Assessment   Reason for Assessment:  Referral from inpatient RD team   ASSESSMENT: 80 year old female with  Colon cancer, followed by Dr Burr Medico.  Patient s/p lap proximal colectomy on 02/02/21.  Planning to start xeloda on 7/4.  Spoke with patient via phone and introduced self.  Patient reports that her appetite is good, "too good."  "I eat all the time."  "I don't want to gain all my weight back."  Patient lives alone and prepares meals for herself, goes to grocery store. Has 2 sisters that help her as well.  Typically eats 2 eggs with bacon, toast with jelly and butter for breakfast and juice.  Lunch is soup. Dinner is meat and vegetables.  Likes fruit.  Has a had time swallowing peanut butter. Does not like ensure shakes and has not been drinking them.  Denies nausea. Has bowel movement (regular) at least daily sometimes 2 times per day.     Medications: folic acid, colace, zofran, protonix   Labs: reviewed   Anthropometrics:   Height: 63 inches Weight: 183 lb on 6/15 Reports weight loss of 40-50 lb in last 6 months Per chart 03/2020 218 lb BMI: 32 16% weight loss in the last year   NUTRITION DIAGNOSIS: Inadequate oral intake related to altered GI function (colon cancer) requiring surgery as evidenced by 16% weight loss in the last year   INTERVENTION:  Discussed importance of good nutrition and weight maintenance with patient today.  Encouraged good sources of protein. Examples of those discussed today.  Contact information provided   MONITORING, EVALUATION, GOAL: weight trends, intake   Next Visit: Friday, July 22nd phone call  Angelica [name], [place], [place] Registered Dietitian [phone] (mobile)

## 2021-03-21 ENCOUNTER — Other Ambulatory Visit (HOSPITAL_COMMUNITY): Payer: Self-pay

## 2021-03-21 MED ORDER — CAPECITABINE 500 MG PO TABS
ORAL_TABLET | ORAL | 0 refills | Status: DC
  Filled 2021-03-21: qty 70, fill #0
  Filled 2021-03-23: qty 70, 28d supply, fill #0

## 2021-03-21 NOTE — Telephone Encounter (Signed)
Oral Chemotherapy Pharmacist Encounter   Attempted to reach patient to provide update and offer for initial counseling on oral medication: Xeloda (capecitabine).   No answer. Unable to leave voicemail - will call back 6/28 to discuss details of medication acquisition and initial counseling session.  Leron Croak, PharmD, BCPS Hematology/Oncology Clinical Pharmacist [facility] [phone] 03/21/2021 9:59 AM

## 2021-03-22 NOTE — Telephone Encounter (Signed)
Oral Chemotherapy Pharmacist Encounter   Attempted to reach patient to provide update and offer for initial counseling on oral medication: Xeloda (capecitabine).   No answer. Left voicemail for patient to call back to discuss details of medication acquisition and initial counseling session.  Leron Croak, PharmD, BCPS Hematology/Oncology Clinical Pharmacist [facility] [phone] 03/22/2021 1:49 PM

## 2021-03-23 ENCOUNTER — Other Ambulatory Visit (HOSPITAL_COMMUNITY): Payer: Self-pay

## 2021-03-23 ENCOUNTER — Ambulatory Visit (HOSPITAL_COMMUNITY): Admit: 2021-03-23 | Payer: Medicare Other | Admitting: Gastroenterology

## 2021-03-23 ENCOUNTER — Encounter (HOSPITAL_COMMUNITY): Payer: Self-pay

## 2021-03-23 SURGERY — COLONOSCOPY WITH PROPOFOL
Anesthesia: Monitor Anesthesia Care

## 2021-03-23 NOTE — Telephone Encounter (Signed)
Oral Chemotherapy Pharmacist Encounter  I spoke with patient's niece, Clerance Lav, for overview of: Xeloda (capecitabine) for the adjuvant treatment of stage IIIC colon cancer, planned duration 6 months.  Counseled on administration, dosing, side effects, monitoring, drug-food interactions, safe handling, storage, and disposal.  Patient will take Xeloda 519m tablets, 2 tablets (1009m by mouth in AM and 3 tabs (150028mby mouth in PM, within 30 minutes of finishing meals, for 7 days on, 7 days off, repeated every 14 days.  Xeloda start date: 03/28/21  Adverse effects include but are not limited to: fatigue, decreased blood counts, GI upset, diarrhea, mouth sores, and hand-foot syndrome.  Patient will obtain anti diarrheal and alert the office of 4 or more loose stools above baseline.  Reviewed with patient importance of keeping a medication schedule and plan for any missed doses. No barriers to medication adherence identified.  Medication reconciliation performed and medication/allergy list updated. Discussed drug-drug interaction between Xeloda and pantoprazole and potential for decreased efficacy of Xeloda when used together. Patient's niece is going to try and have patient try Pepcid (famotidine) while she is on the Xeloda, but will alert office if this is ineffective.   This will ship from the [facility] 03/22/21 to deliver to patient's home on 03/23/21.  Patient's niece informed the pharmacy will reach out 5-7 days prior to needing next fill of Xeloda to coordinate continued medication acquisition to prevent break in therapy.  All questions answered.  Patient's niece voiced understanding and appreciation.   Medication education handout placed in mail for patient. Family knows to call the office with questions or concerns. Oral Chemotherapy Clinic phone number provided.   RebLeron CroakharmD, BCPS Hematology/Oncology Clinical Pharmacist [id number] [phone] [id number]/2022 2:25 PM

## 2021-03-24 ENCOUNTER — Other Ambulatory Visit (HOSPITAL_COMMUNITY): Payer: Self-pay

## 2021-04-07 ENCOUNTER — Other Ambulatory Visit (HOSPITAL_COMMUNITY): Payer: Self-pay

## 2021-04-07 ENCOUNTER — Other Ambulatory Visit: Payer: Self-pay

## 2021-04-07 ENCOUNTER — Encounter: Payer: Self-pay | Admitting: Hematology

## 2021-04-07 ENCOUNTER — Inpatient Hospital Stay: Payer: Medicare Other

## 2021-04-07 ENCOUNTER — Inpatient Hospital Stay: Payer: Medicare Other | Attending: Hematology and Oncology | Admitting: Hematology

## 2021-04-07 VITALS — BP 119/74 | HR 74 | Temp 98.5°F | Resp 19 | Ht 63.0 in | Wt 185.4 lb

## 2021-04-07 DIAGNOSIS — K219 Gastro-esophageal reflux disease without esophagitis: Secondary | ICD-10-CM | POA: Diagnosis not present

## 2021-04-07 DIAGNOSIS — Z85850 Personal history of malignant neoplasm of thyroid: Secondary | ICD-10-CM | POA: Diagnosis not present

## 2021-04-07 DIAGNOSIS — C182 Malignant neoplasm of ascending colon: Secondary | ICD-10-CM | POA: Insufficient documentation

## 2021-04-07 DIAGNOSIS — J449 Chronic obstructive pulmonary disease, unspecified: Secondary | ICD-10-CM | POA: Diagnosis not present

## 2021-04-07 DIAGNOSIS — I10 Essential (primary) hypertension: Secondary | ICD-10-CM | POA: Insufficient documentation

## 2021-04-07 DIAGNOSIS — D509 Iron deficiency anemia, unspecified: Secondary | ICD-10-CM | POA: Insufficient documentation

## 2021-04-07 LAB — COMPREHENSIVE METABOLIC PANEL
ALT: 12 U/L (ref 0–44)
AST: 17 U/L (ref 15–41)
Albumin: 3.6 g/dL (ref 3.5–5.0)
Alkaline Phosphatase: 58 U/L (ref 38–126)
Anion gap: 9 (ref 5–15)
BUN: 15 mg/dL (ref 8–23)
CO2: 29 mmol/L (ref 22–32)
Calcium: 9.1 mg/dL (ref 8.9–10.3)
Chloride: 104 mmol/L (ref 98–111)
Creatinine, Ser: 0.78 mg/dL (ref 0.44–1.00)
GFR, Estimated: >60 mL/min (ref >60)
Glucose, Bld: 102 mg/dL — ABNORMAL HIGH (ref 70–99)
Potassium: 3.2 mmol/L — ABNORMAL LOW (ref 3.5–5.1)
Sodium: 142 mmol/L (ref 135–145)
Total Bilirubin: 0.8 mg/dL (ref 0.3–1.2)
Total Protein: 7.1 g/dL (ref 6.5–8.1)

## 2021-04-07 LAB — CBC WITH DIFFERENTIAL/PLATELET
Abs Immature Granulocytes: 0.02 10*3/uL (ref 0.00–0.07)
Basophils Absolute: 0.1 10*3/uL (ref 0.0–0.1)
Basophils Relative: 1 %
Eosinophils Absolute: 0.3 10*3/uL (ref 0.0–0.5)
Eosinophils Relative: 3 %
HCT: 39.6 % (ref 36.0–46.0)
Hemoglobin: 13.2 g/dL (ref 12.0–15.0)
Immature Granulocytes: 0 %
Lymphocytes Relative: 34 %
Lymphs Abs: 3.1 10*3/uL (ref 0.7–4.0)
MCH: 29 pg (ref 26.0–34.0)
MCHC: 33.3 g/dL (ref 30.0–36.0)
MCV: 87 fL (ref 80.0–100.0)
Monocytes Absolute: 0.7 10*3/uL (ref 0.1–1.0)
Monocytes Relative: 8 %
Neutro Abs: 4.8 10*3/uL (ref 1.7–7.7)
Neutrophils Relative %: 54 %
Platelets: 234 10*3/uL (ref 150–400)
RBC: 4.55 MIL/uL (ref 3.87–5.11)
RDW: 14 % (ref 11.5–15.5)
WBC: 9 10*3/uL (ref 4.0–10.5)
nRBC: 0 % (ref 0.0–0.2)

## 2021-04-07 LAB — IRON AND TIBC
Iron: 52 ug/dL (ref 41–142)
Saturation Ratios: 19 % — ABNORMAL LOW (ref 21–57)
TIBC: 283 ug/dL (ref 236–444)
UIBC: 230 ug/dL (ref 120–384)

## 2021-04-07 LAB — FERRITIN: Ferritin: 63 ng/mL (ref 11–307)

## 2021-04-07 MED ORDER — CAPECITABINE 500 MG PO TABS
ORAL_TABLET | ORAL | 0 refills | Status: DC
  Filled 2021-04-07 – 2021-04-20 (×2): qty 70, 28d supply, fill #0

## 2021-04-07 NOTE — Progress Notes (Signed)
[place]   Telephone:(336) [phone] [phone]   Clinic Follow up Note   Patient Care Team: Angelica Hatchet, MD as PCP - General (Internal Medicine) Angelica Klein, MD as Consulting Physician (General Surgery) Angelica Juniper, MD as Consulting Physician (Gastroenterology) Angelica Lark, MD as Consulting Physician (Hematology and Oncology) Angelica Merle, MD as Consulting Physician (Hematology and Oncology) Angelica Finner, RN (Inactive) as Oncology Nurse Navigator  Date of Service:  04/07/2021  CHIEF COMPLAINT: f/u of colon cancer  SUMMARY OF ONCOLOGIC HISTORY: Oncology History Overview Note  Cancer Staging Cancer of ascending colon pT3, pN2b s/p lap proximal colectomy 02/02/2021 Staging form: Colon and Rectum, AJCC 8th Edition - Pathologic stage from 02/02/2021: Stage IIIC (pT3, pN2b, cM0) - Signed by Angelica Merle, MD on 03/09/2021 Stage prefix: Initial diagnosis Histologic grading system: 4 grade system Histologic grade (G): G2 Residual tumor (R): R0 - None    Cancer of ascending colon pT3, pN2b s/p lap proximal colectomy 02/02/2021   Initial Diagnosis   Cancer of ascending colon pT3, pN2b s/p lap proximal colectomy 02/02/2021   01/06/2021 Imaging   CT AP  IMPRESSION: 1. Abnormal thickening and luminal narrowing involving the cecum and ascending colon up to roughly the level of the mid ascending colon. There also appears to be contiguous thickening in the terminal ileum. Differential considerations include carcinoma, inflammatory bowel disease or focal colitis also involving the terminal ileum. No evidence of associated small bowel obstruction, abscess or bowel perforation. Correlation with evidence of blood in the stool is recommended as well as direct visualization with colonoscopy. 2. Stable large hiatal hernia. 3. Aortic atherosclerosis without evidence of aneurysm.   Aortic Atherosclerosis (ICD10-I70.0).   01/26/2021 Imaging   IMPRESSION: High-grade  proximal large bowel obstruction involving the mid ascending colon. 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   Mild right nonobstructing nephrolithiasis.   Large hiatal hernia   Aortic Atherosclerosis (ICD10-I70.0).     01/29/2021 Procedure   Colonoscopy by Angelica Angelica [name]  IMPRESSION - Preparation of the colon was poor. - Likely malignant completely obstructing tumor in the distal ascending colon. Biopsied. Tattooed. - Stool in the entire examined colon. - Diverticulosis in the sigmoid colon and in the descending colon.   01/29/2021 Initial Biopsy   FINAL MICROSCOPIC DIAGNOSIS:   A. ASCENDING COLON BIOPSY:  - Moderately differentiated colorectal adenocarcinoma.  - See comment.   COMMENT:  Angelica. Melina [name] agrees.      01/30/2021 Imaging   CT Chest  IMPRESSION: 1. There are multiple small bilateral pulmonary nodules, measuring 6 mm and smaller, nonspecific although suspicious for pulmonary metastatic disease. These are new compared to very remote prior CT of the chest dated 08/11/2008 and likely below size threshold for confident PET-CT characterization. 2. Prominent mediastinal lymph nodes, similar in appearance to remote prior CT dated 08/11/2008 and likely benign and reactive. 3. Moderate hiatal hernia with intrathoracic position of the gastric fundus. 4. Esophagogastric tube, tip positioned in the duodenal bulb.   02/02/2021 Surgery   Laparoscopic Assisted Partial Colectomy by Angelica [name]    02/02/2021 Pathology Results   FINAL MICROSCOPIC DIAGNOSIS:   A. COLON, RIGHT, RESECTION:  - Invasive colorectal adenocarcinoma, 5.2 cm.  - Carcinoma extends into pericolonic connective tissue.  - Margins not involved.  - Metastatic carcinoma in seven of twenty eight lymph nodes (7/28).  - One extramural satellite  nodule.  -  See oncology table.   MSI -Stable   Mismatch repair normal   Preserved nuclear expressoin of MLH1, MSHS2, MSH6, PMS2    02/02/2021 Cancer Staging   Staging form: Colon and Rectum, AJCC 8th Edition - Pathologic stage from 02/02/2021: Stage IIIC (pT3, pN2b, cM0) - Signed by Angelica Merle, MD on 03/09/2021  Stage prefix: Initial diagnosis  Histologic grading system: 4 grade system  Histologic grade (G): G2  Residual tumor (R): R0 - None    03/28/2021 -  Chemotherapy   PENDING Adjuvant Xeloda 1027m in the AM and 15089min the PM 2 weeks on/1 week off for 6 months starting 03/28/21.           CURRENT THERAPY:  Adjuvant Xeloda 100045mn the AM and 1500m75m the PM 2 weeks on/1 week off for 6 months, starting 03/29/21  INTERVAL HISTORY:  RuthKAELIE HENIGANhere for a follow up of colon cancer. She was last seen by me on 03/09/21. She presents to the clinic accompanied by her good friend. She reports she experienced diarrhea over the 4th of July weekend. She notes she tried to call our office and left a message on the 4th. She went ahead and started the Xeloda until the following day. According to the patient and her friend, we reached out to her friend because we were unable to reach the patient. She experienced issues swallowing the pills and subsequently has had more issues swallowing anything. She reports she vomited this morning. She notes she has been taking her first two pills at 2 pm and the three at 5 pm.   All other systems were reviewed with the patient and are negative.  MEDICAL HISTORY:  Past Medical History:  Diagnosis Date   Anemia    Arthritis    Asthma    Celiac disease 11/17/2013   COPD (chronic obstructive pulmonary disease) (HCC)[place] on 2L prn home O2   Hypertension    Rectal bleed 10/23/2018   Thyroid cancer (HCC)[place] remote    SURGICAL HISTORY: Past Surgical History:  Procedure Laterality Date   ABDOMINAL HYSTERECTOMY     BIOPSY  01/29/2021    Procedure: BIOPSY;  Surgeon: Angelica [name];  [facility];  Service: Gastroenterology;;   BREAST EXCISIONAL BIOPSY Bilateral    CHOLECYSTECTOMY     COLONOSCOPY  07/05/2012   Procedure: COLONOSCOPY;  Surgeon: Angelica [name];  [facility];  Service: Endoscopy;  Laterality: N/A;   COLONOSCOPY N/A 03/12/2015   Procedure: COLONOSCOPY;  Surgeon: JameLaurence Spates;  Location: [facility];  Service: Endoscopy;  Laterality: N/A;   COLONOSCOPY N/A 01/29/2021   Procedure: COLONOSCOPY;  Surgeon: Angelica [name];  Location: [facility];  Service: Gastroenterology;  Laterality: N/A;   ESOPHAGOGASTRODUODENOSCOPY  07/05/2012   Procedure: ESOPHAGOGASTRODUODENOSCOPY (EGD);  Surgeon: Angelica [name];  [facility];  Service: Endoscopy;  Laterality: N/A;   HOT HEMOSTASIS N/A 03/12/2015   Procedure: HOT HEMOSTASIS (ARGON PLASMA COAGULATION/BICAP);  Surgeon: JameLaurence Spates;  [facility];  Service: Endoscopy;  Laterality: N/A;   LAPAROSCOPIC PARTIAL COLECTOMY N/A 02/02/2021   Procedure: LAPAROSCOPIC ASSISTED PARTIAL COLECTOMY;  Surgeon: ByerStark [name];  Location: [facility];  Service: General;  Laterality: N/A;   SUBMUCOSAL TATTOO INJECTION  01/29/2021   Procedure: SUBMUCOSAL TATTOO INJECTION;  Surgeon: Angelica [name];  [facility];  Service: Gastroenterology;;   TONSILLECTOMY      I have reviewed the social history  and family history with the patient and they are unchanged from previous note.  ALLERGIES:  is allergic to other, fosamax [alendronate sodium], penicillins, sulfa antibiotics, and codeine.  MEDICATIONS:  Current Outpatient Medications  Medication Sig Dispense Refill   acetaminophen (TYLENOL) 325 MG tablet Take 2 tablets (650 mg total) by mouth every 6 (six) hours as needed for mild pain or fever.     albuterol (PROVENTIL HFA;VENTOLIN HFA) 108 (90 Base) MCG/ACT inhaler Inhale into the lungs every 6 (six) hours as needed for  wheezing or shortness of breath.     capecitabine (XELODA) 500 MG tablet Take 2 tablets by mouth in the morning and 3 tablets by mouth in the evening for 7 days then off for 7 days. Take within 30 minutes after meals. Start on 03/28/2021 70 tablet 0   docusate sodium (COLACE) 250 MG capsule Take 250 mg by mouth daily as needed for constipation.     folic acid (FOLVITE) 1 MG tablet Take 1 tablet (1 mg total) by mouth daily. 30 tablet 1   hydrocortisone (ANUSOL-HC) 25 MG suppository Place 1 suppository (25 mg total) rectally 2 (two) times daily as needed for hemorrhoids or anal itching. (Patient not taking: Reported on 01/27/2021) 12 suppository 0   levothyroxine (SYNTHROID, LEVOTHROID) 125 MCG tablet Take 125 mcg by mouth daily.     ondansetron (ZOFRAN ODT) 8 MG disintegrating tablet Take 1 tablet (8 mg total) by mouth every 8 (eight) hours as needed. 8 tablet 0   OXYGEN Inhale 2 L into the lungs as needed (LOW OXYGEN).     polyethylene glycol (MIRALAX) 17 g packet Take 17 g by mouth daily as needed for mild constipation. 14 each 0   traMADol (ULTRAM) 50 MG tablet Take 1 tablet (50 mg total) by mouth every 6 (six) hours as needed for moderate pain or severe pain (50 mg for moderate, 100 mg for severe). 10 tablet 0   No current facility-administered medications for this visit.    PHYSICAL EXAMINATION: ECOG PERFORMANCE STATUS: 2 - Symptomatic, <50% confined to bed  Vitals:   04/07/21 1444  BP: 119/74  Pulse: 74  Resp: 19  Temp: 98.5 F (36.9 C)  SpO2: 97%   Filed Weights   04/07/21 1444  Weight: 185 lb 6.4 oz (84.1 kg)    Due to COVID19 we will limit examination to appearance. Patient had no complaints.  GENERAL:alert, no distress and comfortable SKIN: skin color normal, no rashes or significant lesions EYES: normal, Conjunctiva are pink and non-injected, sclera clear  NEURO: alert & oriented x 3 with fluent speech  LABORATORY DATA:  I have reviewed the data as listed CBC Latest Ref  Rng & Units 04/07/2021 03/09/2021 02/08/2021  WBC 4.0 - 10.5 K/uL 9.0 6.9 10.1  Hemoglobin 12.0 - 15.0 g/dL 13.2 12.3 9.4(L)  Hematocrit 36.0 - 46.0 % 39.6 38.7 29.5(L)  Platelets 150 - 400 K/uL 234 239 293     CMP Latest Ref Rng & Units 04/07/2021 03/09/2021 02/08/2021  Glucose 70 - 99 mg/dL 102(H) 106(H) 93  BUN 8 - 23 mg/dL 15 12 15   Creatinine 0.44 - 1.00 mg/dL 0.78 0.86 0.62  Sodium 135 - 145 mmol/L 142 143 136  Potassium 3.5 - 5.1 mmol/L 3.2(L) 3.1(L) 4.2  Chloride 98 - 111 mmol/L 104 104 102  CO2 22 - 32 mmol/L 29 30 29   Calcium 8.9 - 10.3 mg/dL 9.1 9.4 8.5(L)  Total Protein 6.5 - 8.1 g/dL 7.1 6.9 -  Total Bilirubin  0.3 - 1.2 mg/dL 0.8 1.1 -  Alkaline Phos 38 - 126 U/L 58 56 -  AST 15 - 41 U/L 17 18 -  ALT 0 - 44 U/L 12 12 -      RADIOGRAPHIC STUDIES: I have personally reviewed the radiological images as listed and agreed with the findings in the report. No results found.   ASSESSMENT & PLAN:  Angelica [name] is a 80 y.o. female with   Colon adenocarcinoma, pT3N2bM0, stage IIIC, MSS -She was seen to have obstructive colon mass on 01/06/21 CT AP. Her 01/29/21 Colonoscopy showed this to be Moderately differentiated colorectal adenocarcinoma. Staging CT chest showed nonspecific although suspicious for pulmonary metastatic disease. -She underwent Partial colectomy on 02/02/21 with Angelica [name]. Surgical path showed 5.2cm of Invasive colorectal adenocarcinoma was completely removed with clear margins. She did have 7/28 positive lymph nodes. -With this much LN involvement, her cancer was locally advanced and she has higher risk of recurrence (>50%). Given her age and tolerance, I recommend less intensive chemo with oral Xeloda for 6 months to reduce her risk of recurrence. She at lower dose 1053m in the AM and 15041min the PM one week on/one week off.  -She began the Xeloda on 03/29/21. She did experience issues swallowing the pills. She did not dissolve the medicine. She has also been taking  the two sets of medicine to close together, which has also caused issues with her stomach. I reviewed the correct schedule with her and her friend  -She is now in her off week of C1. She will begin C2 next week and return to see me 04/25/21. She is OK to continue for now, but probably will stop if she has more side effects  -f/u on 8/1 before cyle 3, I refilled Xeloda today    2.  Chronic iron deficiency anemia -She has been treated with B12 injections and IV Iron before managed by Angelica GoAlvy Bimlerstopped in 08/2020. Pt notes low iron absorption with oral iron. -With her Colon cancer, she had GI blood loss which lead to worsened anemia. After colon surgery, her Hg was 9.4. -resolved since colectomy. Hgb 13.2 today (04/07/21)   3. Symptom Management: diarrhea, GERD -worsened since colectomy -she enjoys salads, but this brought on diarrhea recently. -has issues swallowing the pills, can cause burning. I advised her to take them with food, which she reports she does. -I recommend she use Tums to manage this. I also discussed she needs to ensure she drinks enough fluids. -She has physical therapists coming to her home to work with her.   3.  Small pulmonary nodules noted on CT scan, too small to characterize.  -Will repeat scan in 04/2021.    4. Comorbidities: HTN, COPD, Hypothyroidism with remote history of thyroid cancer, Arthritis -She has chronic left knee pain and use to have repeated injections. She notes since long hospitalization she has left leg pain and weakness that radiates to her lower back.   5. Social Support -Her husband and 2 children passed away years ago. She lives alone but has help form her 2 older sisters and her niece DaQuita Skyend Friend. Her niece will be her first point of contact. -She notes she lives off social security and has insurance with [employer] will find financial assistance if needed.      PLAN:  -Continue Xeloda at same reduced dose (1000 mg in the morning and 1500 mg  in the evening, 7 days on and 7 days off),  start cycle 2 on 04/13/21 -Lab and F/u on 04/25/21    No problem-specific Assessment & Plan notes found for this encounter.   No orders of the defined types were placed in this encounter.  All questions were answered. The patient knows to call the clinic with any problems, questions or concerns. No barriers to learning was detected. The total time spent in the appointment was 30 minutes.     Angelica Merle, MD 04/07/2021   I, Wilburn Mylar, am acting as scribe for Angelica Merle, MD.   I have reviewed the above documentation for accuracy and completeness, and I agree with the above.

## 2021-04-08 ENCOUNTER — Telehealth: Payer: Self-pay | Admitting: Hematology

## 2021-04-08 NOTE — Telephone Encounter (Signed)
Scheduled follow-up appointment per 7/14 los. Patient is aware.

## 2021-04-12 ENCOUNTER — Telehealth: Payer: Self-pay

## 2021-04-12 NOTE — Telephone Encounter (Signed)
Patient has been made aware of lab results and verbalized understanding. We went over many potassium rich foods together and I also mailed her a potassium content of foods to help as well.

## 2021-04-12 NOTE — Telephone Encounter (Signed)
-----   Message from Truitt Merle, MD sent at 04/10/2021 10:47 PM EDT ----- Please let pt know her K was slightly low last week, encourage her to take K rich food, thanks   Truitt Merle  04/10/2021

## 2021-04-15 ENCOUNTER — Inpatient Hospital Stay: Payer: Medicare Other | Attending: Oncology

## 2021-04-15 ENCOUNTER — Other Ambulatory Visit (HOSPITAL_COMMUNITY): Payer: Self-pay

## 2021-04-15 NOTE — Progress Notes (Signed)
Nutrition Follow-up:   Patient with colon cancer followed by Dr Burr Medico. Currently on xeloda.  S/p lap proximal colectomy on 02/02/21.    Spoke with patient via phone.  Patient reports that appetite is still good.  Says that today she has not felt up to par but started chemo pill on Wednesday.  Ate a good breakfast of 2 eggs, 2 pieces of bacon, 2 pieces of toast with jelly, and juice.  Says that she felt nauseated and laid down for about an hour and feels better. Ate some cantelope for dinner.  Planning spaghetti for supper.  "I am doing good on this chemo pill."    Medications: reviewed  Labs: reviewed  Anthropometrics:   Weight 185 lb 6.4 oz on 7/6  183 lb on 6/15  218 lb on 03/2020   NUTRITION DIAGNOSIS: Inadequate oral intake improving   INTERVENTION:  Encouraged taking nausea medication if needed to help symptoms.  Patient does not like to take medicine Encouraged well balanced diet including good sources of protein for weight maintenance    MONITORING, EVALUATION, GOAL: weight trends, intake   NEXT VISIT: Friday, Sept 2nd phone call  Angelica [name] B. Zenia Resides, [place], [place] Registered Dietitian [phone] (mobile)

## 2021-04-20 ENCOUNTER — Other Ambulatory Visit (HOSPITAL_COMMUNITY): Payer: Self-pay

## 2021-04-21 ENCOUNTER — Other Ambulatory Visit (HOSPITAL_COMMUNITY): Payer: Self-pay

## 2021-04-29 ENCOUNTER — Inpatient Hospital Stay: Payer: Medicare Other | Attending: Hematology | Admitting: Hematology

## 2021-04-29 ENCOUNTER — Other Ambulatory Visit: Payer: Self-pay

## 2021-04-29 ENCOUNTER — Inpatient Hospital Stay: Payer: Medicare Other

## 2021-04-29 VITALS — BP 138/76 | HR 71 | Temp 97.7°F | Resp 18 | Ht 63.0 in | Wt 187.7 lb

## 2021-04-29 DIAGNOSIS — I10 Essential (primary) hypertension: Secondary | ICD-10-CM | POA: Diagnosis not present

## 2021-04-29 DIAGNOSIS — J449 Chronic obstructive pulmonary disease, unspecified: Secondary | ICD-10-CM | POA: Diagnosis not present

## 2021-04-29 DIAGNOSIS — C182 Malignant neoplasm of ascending colon: Secondary | ICD-10-CM

## 2021-04-29 DIAGNOSIS — R197 Diarrhea, unspecified: Secondary | ICD-10-CM | POA: Insufficient documentation

## 2021-04-29 DIAGNOSIS — D509 Iron deficiency anemia, unspecified: Secondary | ICD-10-CM

## 2021-04-29 DIAGNOSIS — K219 Gastro-esophageal reflux disease without esophagitis: Secondary | ICD-10-CM | POA: Insufficient documentation

## 2021-04-29 DIAGNOSIS — E039 Hypothyroidism, unspecified: Secondary | ICD-10-CM | POA: Diagnosis not present

## 2021-04-29 LAB — COMPREHENSIVE METABOLIC PANEL
ALT: 10 U/L (ref 0–44)
AST: 16 U/L (ref 15–41)
Albumin: 3.7 g/dL (ref 3.5–5.0)
Alkaline Phosphatase: 58 U/L (ref 38–126)
Anion gap: 10 (ref 5–15)
BUN: 16 mg/dL (ref 8–23)
CO2: 29 mmol/L (ref 22–32)
Calcium: 9.4 mg/dL (ref 8.9–10.3)
Chloride: 103 mmol/L (ref 98–111)
Creatinine, Ser: 0.78 mg/dL (ref 0.44–1.00)
GFR, Estimated: >60 mL/min (ref >60)
Glucose, Bld: 97 mg/dL (ref 70–99)
Potassium: 4.1 mmol/L (ref 3.5–5.1)
Sodium: 142 mmol/L (ref 135–145)
Total Bilirubin: 0.8 mg/dL (ref 0.3–1.2)
Total Protein: 6.9 g/dL (ref 6.5–8.1)

## 2021-04-29 LAB — CBC WITH DIFFERENTIAL/PLATELET
Abs Immature Granulocytes: 0.02 10*3/uL (ref 0.00–0.07)
Basophils Absolute: 0.1 10*3/uL (ref 0.0–0.1)
Basophils Relative: 1 %
Eosinophils Absolute: 0.3 10*3/uL (ref 0.0–0.5)
Eosinophils Relative: 4 %
HCT: 40.2 % (ref 36.0–46.0)
Hemoglobin: 13.2 g/dL (ref 12.0–15.0)
Immature Granulocytes: 0 %
Lymphocytes Relative: 41 %
Lymphs Abs: 3.4 10*3/uL (ref 0.7–4.0)
MCH: 29.6 pg (ref 26.0–34.0)
MCHC: 32.8 g/dL (ref 30.0–36.0)
MCV: 90.1 fL (ref 80.0–100.0)
Monocytes Absolute: 0.7 10*3/uL (ref 0.1–1.0)
Monocytes Relative: 9 %
Neutro Abs: 3.8 10*3/uL (ref 1.7–7.7)
Neutrophils Relative %: 45 %
Platelets: 230 10*3/uL (ref 150–400)
RBC: 4.46 MIL/uL (ref 3.87–5.11)
RDW: 15.4 % (ref 11.5–15.5)
WBC: 8.3 10*3/uL (ref 4.0–10.5)
nRBC: 0 % (ref 0.0–0.2)

## 2021-04-29 LAB — IRON AND TIBC
Iron: 74 ug/dL (ref 41–142)
Saturation Ratios: 23 % (ref 21–57)
TIBC: 316 ug/dL (ref 236–444)
UIBC: 242 ug/dL (ref 120–384)

## 2021-04-29 LAB — FERRITIN: Ferritin: 42 ng/mL (ref 11–307)

## 2021-04-29 NOTE — Progress Notes (Signed)
[place]   [phone] [phone]   Clinic Follow up Note   Patient Care Team: Velna Hatchet, MD as PCP - General (Internal Medicine) Stark Klein, MD as Consulting Physician (General Surgery) Ronnette Juniper, MD as Consulting Physician (Gastroenterology) [name], MD as Consulting Physician (Hematology and Oncology) Truitt Merle, MD as Consulting Physician (Hematology and Oncology) Jonnie Finner, RN (Inactive) as Oncology Nurse Navigator  Date of Service:  04/29/2021  CHIEF COMPLAINT: f/u of colon cancer  SUMMARY OF ONCOLOGIC HISTORY: Oncology History Overview Note  Cancer Staging Cancer of ascending colon pT3, pN2b s/p lap proximal colectomy 02/02/2021 Staging form: Colon and Rectum, AJCC 8th Edition - Pathologic stage from 02/02/2021: Stage IIIC (pT3, pN2b, cM0) - Signed by Truitt Merle, MD on 03/09/2021 Stage prefix: Initial diagnosis Histologic grading system: 4 grade system Histologic grade (G): G2 Residual tumor (R): R0 - None    Cancer of ascending colon pT3, pN2b s/p lap proximal colectomy 02/02/2021   Initial Diagnosis   Cancer of ascending colon pT3, pN2b s/p lap proximal colectomy 02/02/2021   01/06/2021 Imaging   CT AP  IMPRESSION: 1. Abnormal thickening and luminal narrowing involving the cecum and ascending colon up to roughly the level of the mid ascending colon. There also appears to be contiguous thickening in the terminal ileum. Differential considerations include carcinoma, inflammatory bowel disease or focal colitis also involving the terminal ileum. No evidence of associated small bowel obstruction, abscess or bowel perforation. Correlation with evidence of blood in the stool is recommended as well as direct visualization with colonoscopy. 2. Stable large hiatal hernia. 3. Aortic atherosclerosis without evidence of aneurysm.   Aortic Atherosclerosis (ICD10-I70.0).   01/26/2021 Imaging   IMPRESSION: High-grade  proximal large bowel obstruction involving the mid ascending colon. 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   Mild right nonobstructing nephrolithiasis.   Large hiatal hernia   Aortic Atherosclerosis (ICD10-I70.0).     01/29/2021 Procedure   Colonoscopy by Dr Therisa Doyne  IMPRESSION - Preparation of the colon was poor. - Likely malignant completely obstructing tumor in the distal ascending colon. Biopsied. Tattooed. - Stool in the entire examined colon. - Diverticulosis in the sigmoid colon and in the descending colon.   01/29/2021 Initial Biopsy   FINAL MICROSCOPIC DIAGNOSIS:   A. ASCENDING COLON BIOPSY:  - Moderately differentiated colorectal adenocarcinoma.  - See comment.   COMMENT:  Dr. Melina Copa agrees.      01/30/2021 Imaging   CT Chest  IMPRESSION: 1. There are multiple small bilateral pulmonary nodules, measuring 6 mm and smaller, nonspecific although suspicious for pulmonary metastatic disease. These are new compared to very remote prior CT of the chest dated 08/11/2008 and likely below size threshold for confident PET-CT characterization. 2. Prominent mediastinal lymph nodes, similar in appearance to remote prior CT dated 08/11/2008 and likely benign and reactive. 3. Moderate hiatal hernia with intrathoracic position of the gastric fundus. 4. Esophagogastric tube, tip positioned in the duodenal bulb.   02/02/2021 Surgery   Laparoscopic Assisted Partial Colectomy by Dr Barry Dienes    02/02/2021 Pathology Results   FINAL MICROSCOPIC DIAGNOSIS:   A. COLON, RIGHT, RESECTION:  - Invasive colorectal adenocarcinoma, 5.2 cm.  - Carcinoma extends into pericolonic connective tissue.  - Margins not involved.  - Metastatic carcinoma in seven of twenty eight lymph nodes (7/28).  - One extramural satellite  nodule.  -  See oncology table.   MSI -Stable   Mismatch repair normal   Preserved nuclear expressoin of MLH1, MSHS2, MSH6, PMS2    02/02/2021 Cancer Staging   Staging form: Colon and Rectum, AJCC 8th Edition - Pathologic stage from 02/02/2021: Stage IIIC (pT3, pN2b, cM0) - Signed by Truitt Merle, MD on 03/09/2021  Stage prefix: Initial diagnosis  Histologic grading system: 4 grade system  Histologic grade (G): G2  Residual tumor (R): R0 - None    03/28/2021 -  Chemotherapy   PENDING Adjuvant Xeloda 1010m in the AM and 15075min the PM 2 weeks on/1 week off for 6 months starting 03/28/21.           CURRENT THERAPY:  Adjuvant Xeloda 100074mn the AM and 1500m6m the PM 2 weeks on/1 week off for 6 months, starting 03/29/21  INTERVAL HISTORY:  Angelica LOEZAhere for a follow up of colon cancer. She was last seen by me on 04/07/21. She presents to the clinic accompanied by a good friend. She reports she recently developed a red spot to her incision site. She also reports having a lot of gas and some hair thinning. She notes she has home care come to her house. She reports she has tailbone pain.   All other systems were reviewed with the patient and are negative.  MEDICAL HISTORY:  Past Medical History:  Diagnosis Date   Anemia    Arthritis    Asthma    Celiac disease 11/17/2013   COPD (chronic obstructive pulmonary disease) (HCC)[place] on 2L prn home O2   Hypertension    Rectal bleed 10/23/2018   Thyroid cancer (HCC)[place] remote    SURGICAL HISTORY: Past Surgical History:  Procedure Laterality Date   ABDOMINAL HYSTERECTOMY     BIOPSY  01/29/2021   Procedure: BIOPSY;  Surgeon: KarkRonnette Juniper;  [facility];  Service: Gastroenterology;;   BREAST EXCISIONAL BIOPSY Bilateral    CHOLECYSTECTOMY     COLONOSCOPY  07/05/2012   Procedure: COLONOSCOPY;  Surgeon: JameWinfield CunasD;  [facility];  Service: Endoscopy;  Laterality: N/A;   COLONOSCOPY N/A  03/12/2015   Procedure: COLONOSCOPY;  Surgeon: JameLaurence Spates;  Location: [facility];  Service: Endoscopy;  Laterality: N/A;   COLONOSCOPY N/A 01/29/2021   Procedure: COLONOSCOPY;  Surgeon: KarkRonnette Juniper;  [facility];  Service: Gastroenterology;  Laterality: N/A;   ESOPHAGOGASTRODUODENOSCOPY  07/05/2012   Procedure: ESOPHAGOGASTRODUODENOSCOPY (EGD);  Surgeon: JameWinfield CunasD;  [facility];  Service: Endoscopy;  Laterality: N/A;   HOT HEMOSTASIS N/A 03/12/2015   Procedure: HOT HEMOSTASIS (ARGON PLASMA COAGULATION/BICAP);  Surgeon: JameLaurence Spates;  [facility];  Service: Endoscopy;  Laterality: N/A;   LAPAROSCOPIC PARTIAL COLECTOMY N/A 02/02/2021   Procedure: LAPAROSCOPIC ASSISTED PARTIAL COLECTOMY;  Surgeon: ByerStark Klein;  [facility];  Service: General;  Laterality: N/A;   SUBMUCOSAL TATTOO INJECTION  01/29/2021   Procedure: SUBMUCOSAL TATTOO INJECTION;  Surgeon: KarkRonnette Juniper;  [facility];  Service: Gastroenterology;;   TONSILLECTOMY      I have reviewed the social history and family history with the patient and they are unchanged from previous note.  ALLERGIES:  is allergic to other, fosamax [alendronate sodium], penicillins, sulfa antibiotics, and codeine.  MEDICATIONS:  Current Outpatient Medications  Medication Sig Dispense Refill   acetaminophen (TYLENOL) 325 MG tablet Take 2 tablets (650 mg total) by mouth every 6 (six)  hours as needed for mild pain or fever.     albuterol (PROVENTIL HFA;VENTOLIN HFA) 108 (90 Base) MCG/ACT inhaler Inhale into the lungs every 6 (six) hours as needed for wheezing or shortness of breath.     capecitabine (XELODA) 500 MG tablet Take 2 tablets by mouth in the morning and 3 tablets by mouth in the evening for 7 days then off for 7 days. Take within 30 minutes after meals. Start on 03/28/2021 70 tablet 0   docusate sodium (COLACE) 250 MG capsule Take 250 mg by mouth daily as needed for  constipation.     folic acid (FOLVITE) 1 MG tablet Take 1 tablet (1 mg total) by mouth daily. 30 tablet 1   hydrocortisone (ANUSOL-HC) 25 MG suppository Place 1 suppository (25 mg total) rectally 2 (two) times daily as needed for hemorrhoids or anal itching. (Patient not taking: Reported on 01/27/2021) 12 suppository 0   levothyroxine (SYNTHROID, LEVOTHROID) 125 MCG tablet Take 125 mcg by mouth daily.     ondansetron (ZOFRAN ODT) 8 MG disintegrating tablet Take 1 tablet (8 mg total) by mouth every 8 (eight) hours as needed. 8 tablet 0   OXYGEN Inhale 2 L into the lungs as needed (LOW OXYGEN).     polyethylene glycol (MIRALAX) 17 g packet Take 17 g by mouth daily as needed for mild constipation. 14 each 0   traMADol (ULTRAM) 50 MG tablet Take 1 tablet (50 mg total) by mouth every 6 (six) hours as needed for moderate pain or severe pain (50 mg for moderate, 100 mg for severe). 10 tablet 0   No current facility-administered medications for this visit.    PHYSICAL EXAMINATION: ECOG PERFORMANCE STATUS: 2 - Symptomatic, <50% confined to bed  Vitals:   04/29/21 1444  BP: 138/76  Pulse: 71  Resp: 18  Temp: 97.7 F (36.5 C)  SpO2: 98%   Wt Readings from Last 3 Encounters:  04/29/21 187 lb 11.2 oz (85.1 kg)  04/07/21 185 lb 6.4 oz (84.1 kg)  03/09/21 183 lb 4.8 oz (83.1 kg)     GENERAL:alert, no distress and comfortable SKIN: skin color normal, no rashes or significant lesions EYES: normal, Conjunctiva are pink and non-injected, sclera clear  NEURO: alert & oriented x 3 with fluent speech  LABORATORY DATA:  I have reviewed the data as listed CBC Latest Ref Rng & Units 04/29/2021 04/07/2021 03/09/2021  WBC 4.0 - 10.5 K/uL 8.3 9.0 6.9  Hemoglobin 12.0 - 15.0 g/dL 13.2 13.2 12.3  Hematocrit 36.0 - 46.0 % 40.2 39.6 38.7  Platelets 150 - 400 K/uL 230 234 239     CMP Latest Ref Rng & Units 04/07/2021 03/09/2021 02/08/2021  Glucose 70 - 99 mg/dL 102(H) 106(H) 93  BUN 8 - 23 mg/dL 15 12 15    Creatinine 0.44 - 1.00 mg/dL 0.78 0.86 0.62  Sodium 135 - 145 mmol/L 142 143 136  Potassium 3.5 - 5.1 mmol/L 3.2(L) 3.1(L) 4.2  Chloride 98 - 111 mmol/L 104 104 102  CO2 22 - 32 mmol/L 29 30 29   Calcium 8.9 - 10.3 mg/dL 9.1 9.4 8.5(L)  Total Protein 6.5 - 8.1 g/dL 7.1 6.9 -  Total Bilirubin 0.3 - 1.2 mg/dL 0.8 1.1 -  Alkaline Phos 38 - 126 U/L 58 56 -  AST 15 - 41 U/L 17 18 -  ALT 0 - 44 U/L 12 12 -      RADIOGRAPHIC STUDIES: I have personally reviewed the radiological images as listed and agreed with  the findings in the report. No results found.   ASSESSMENT & PLAN:  Angelica [name] is a 80 y.o. female with   Colon adenocarcinoma, pT3N2bM0, stage IIIC, MSS -She was seen to have obstructive colon mass on 01/06/21 CT AP. Her 01/29/21 Colonoscopy showed this to be Moderately differentiated colorectal adenocarcinoma. Staging CT chest showed nonspecific although suspicious for pulmonary metastatic disease. -She underwent Partial colectomy on 02/02/21 with Dr Barry Dienes. Surgical path showed 5.2cm of Invasive colorectal adenocarcinoma was completely removed with clear margins. She did have 7/28 positive lymph nodes. -With this much LN involvement, her cancer was locally advanced and she has higher risk of recurrence (>50%). Given her age and tolerance, I recommend less intensive chemo with oral Xeloda for 6 months to reduce her risk of recurrence. She at lower dose 1061m in the AM and 1505min the PM one week on/one week off.  -She began the Xeloda on 03/29/21. She expressed difficulty swallowing the three pills at the same time in the evening. I advised her to take two in the morning, one after lunch and two in the evening, as long as the two in the morning and two in the evening are 10 hours apart. -she is clinically doing well. Labs reviewed, CBC and CMP WNL. We will continue Xeloda. She started her most recent cycle this past Wednesday, 04/27/21.   2.  Chronic iron deficiency anemia -She has been  treated with B12 injections and IV Iron before managed by Dr GoAlvy Bimlerstopped in 08/2020. Pt notes low iron absorption with oral iron. -With her Colon cancer, she had GI blood loss which lead to worsened anemia. After colon surgery, her Hg was 9.4. -resolved since colectomy.    3. Symptom Management: diarrhea, GERD -worsened since colectomy -has issues swallowing the pills, can cause burning. I advised her to take them with food, which she reports she does. -She has physical therapists coming to her home to work with her.   3.  Small pulmonary nodules noted on CT scan, too small to characterize.  -Will repeat scan in 04/2021.    4. Comorbidities: HTN, COPD, Hypothyroidism with remote history of thyroid cancer, Arthritis -She has chronic left knee pain and use to have repeated injections. She notes since long hospitalization she has left leg pain and weakness that radiates to her lower back.   5. Social Support -Her husband and 2 children passed away years ago. She lives alone but has help form her 2 older sisters and her niece DaQuita Skyend Friend. Her niece will be her first point of contact. -She notes she lives off social security and has insurance with [employer] will find financial assistance if needed.      PLAN:  -Continue Xeloda at same reduced dose (she has difficulty to take 3 tabs at one time, so she will take 2 tab am, 1 tab after lunch and 2 tab in evening) for 7 days on and 7 days off  -Lab and F/u in 1 month, with CT chest w/o contrast prior to next visit.   No problem-specific Assessment & Plan notes found for this encounter.   Orders Placed This Encounter  Procedures   CT Chest Wo Contrast    Standing Status:   Future    Standing Expiration Date:   04/29/2022    Order Specific Question:   [facility]?    [facility]  Order Specific Question:   Release to patient  Answer:   Immediate   All questions were answered. The patient knows to  call the clinic with any problems, questions or concerns. No barriers to learning was detected. The total time spent in the appointment was 30 minutes.     Truitt Merle, MD 04/29/2021   I, Wilburn Mylar, am acting as scribe for Truitt Merle, MD.   I have reviewed the above documentation for accuracy and completeness, and I agree with the above.

## 2021-05-01 ENCOUNTER — Encounter: Payer: Self-pay | Admitting: Hematology

## 2021-05-06 ENCOUNTER — Ambulatory Visit
Admission: RE | Admit: 2021-05-06 | Discharge: 2021-05-06 | Disposition: A | Discharge status: Home / Self Care | Payer: Medicare Other | Source: Ambulatory Visit | Attending: Hematology | Admitting: Hematology

## 2021-05-06 DIAGNOSIS — R918 Other nonspecific abnormal finding of lung field: Secondary | ICD-10-CM | POA: Diagnosis not present

## 2021-05-06 DIAGNOSIS — R911 Solitary pulmonary nodule: Secondary | ICD-10-CM | POA: Diagnosis not present

## 2021-05-06 DIAGNOSIS — J439 Emphysema, unspecified: Secondary | ICD-10-CM | POA: Diagnosis not present

## 2021-05-06 DIAGNOSIS — I700 Atherosclerosis of aorta: Secondary | ICD-10-CM | POA: Diagnosis not present

## 2021-05-06 DIAGNOSIS — C182 Malignant neoplasm of ascending colon: Secondary | ICD-10-CM

## 2021-05-06 IMAGING — CT CT CHEST W/O CM
2 of 4 series · 15 of 36 positions shown, 18 images · non-contrast
Comparison: [DATE]

CLINICAL DATA: Follow-up pulmonary nodules. History of colon
cancer. On oral chemotherapy.

EXAM:
CT CHEST WITHOUT CONTRAST
TECHNIQUE: Multidetector CT imaging of the chest was performed following the
standard protocol without IV contrast.

[Series 2: chest 2.00 br40 s3 · axial · 0.82mm/px · z∈[+1599,+1857]mm · 12 of 153 slices shown, 15 images (1 of 2)]
[im 12/153  mediastinal]
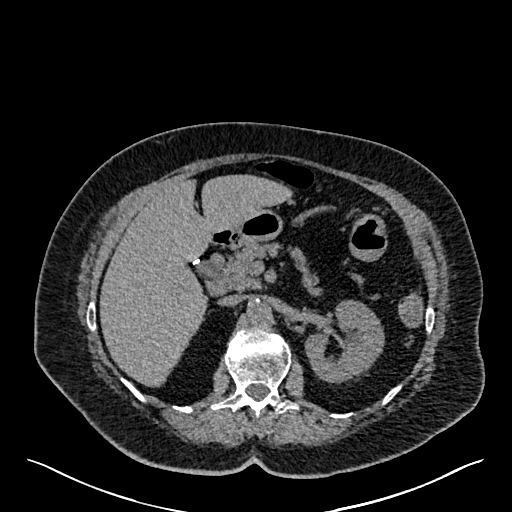
[im 12/153  lung]
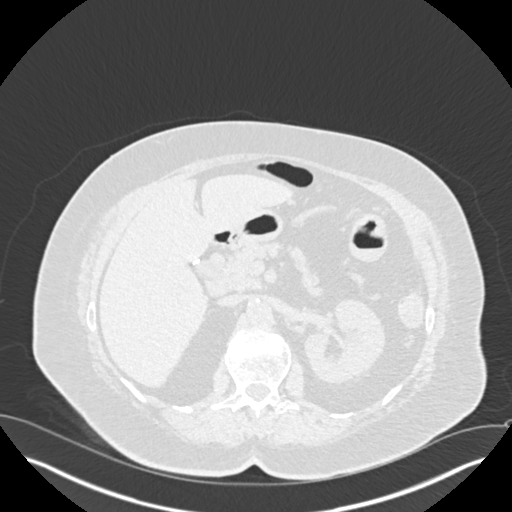
[im 24/153  lung]
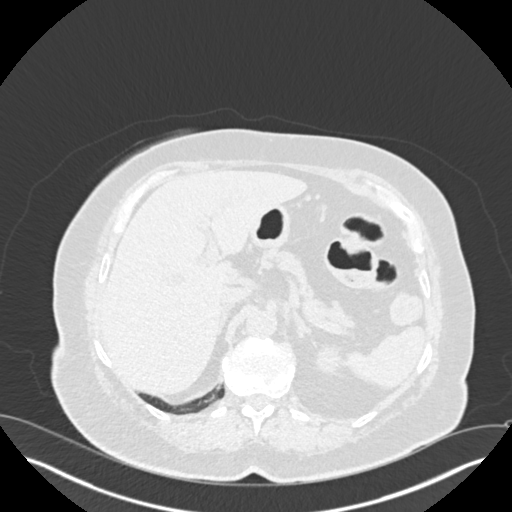
[im 36/153  lung]
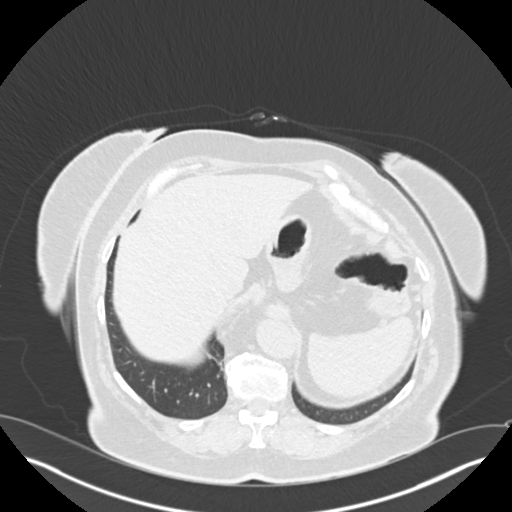
[im 47/153  lung]
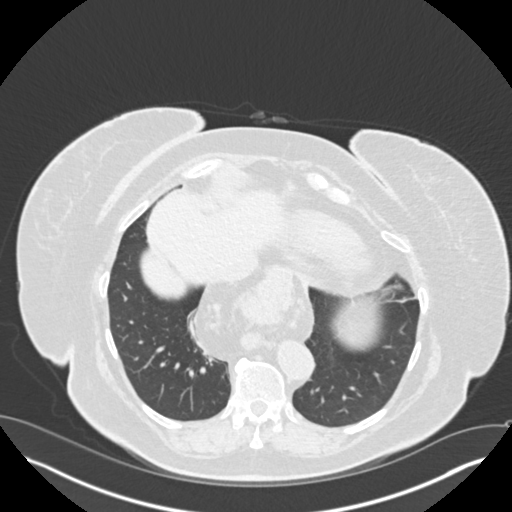
[im 59/153  mediastinal]
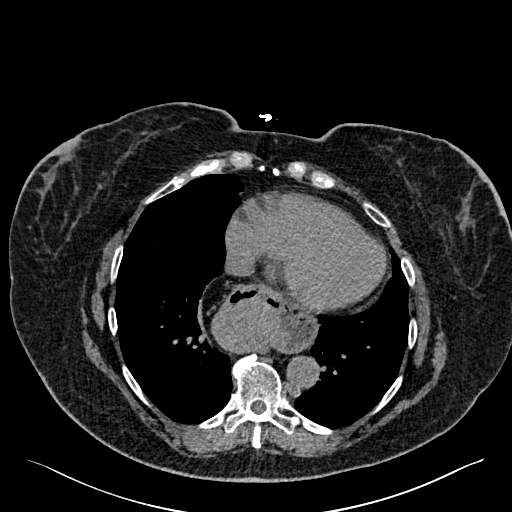
[im 59/153  lung]
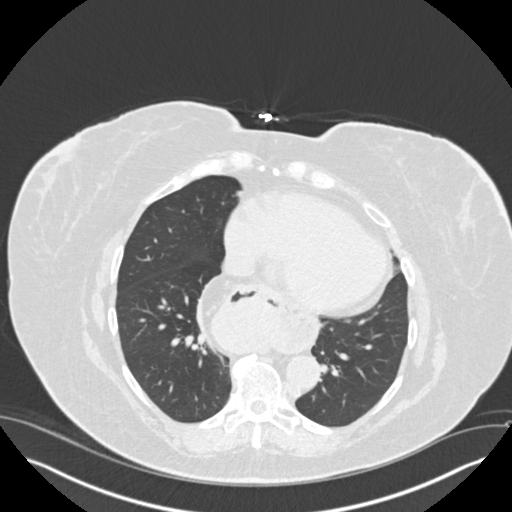
[im 71/153  lung]
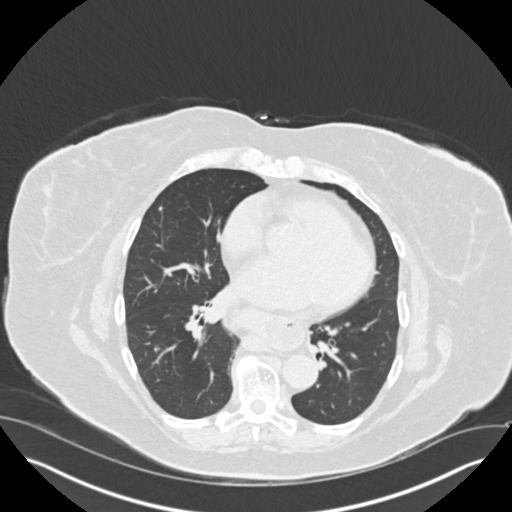
[im 82/153  lung]
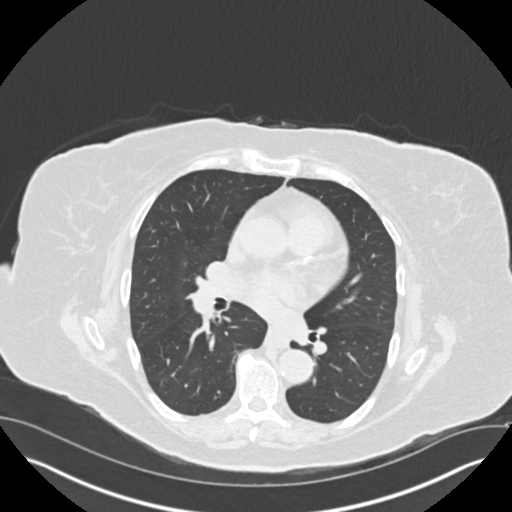
[im 94/153  lung]
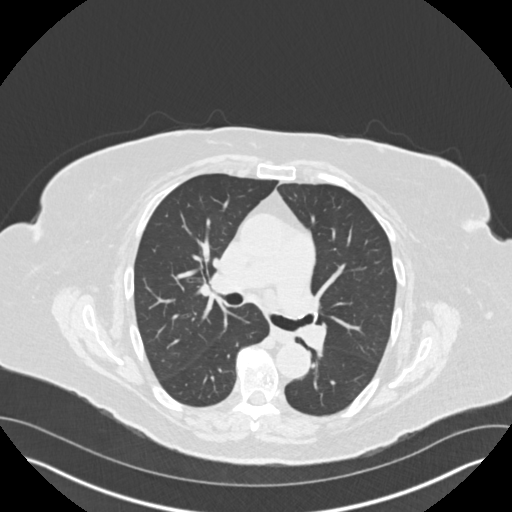
[im 106/153  mediastinal]
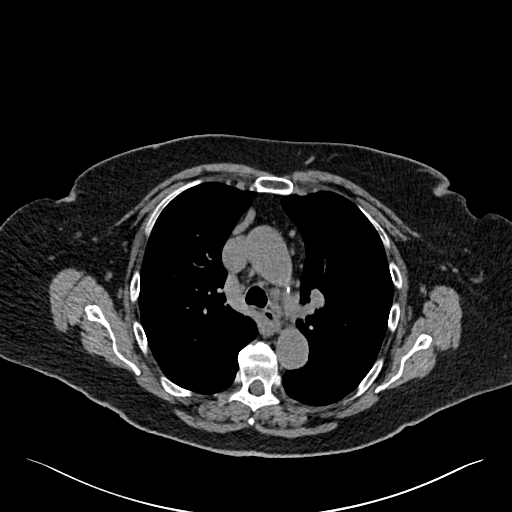
[im 106/153  lung]
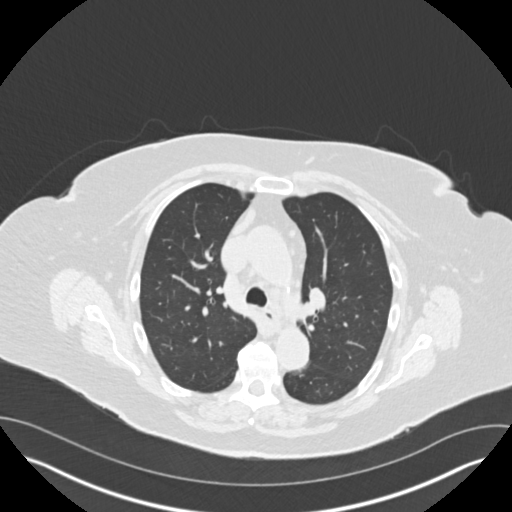
[im 117/153  lung]
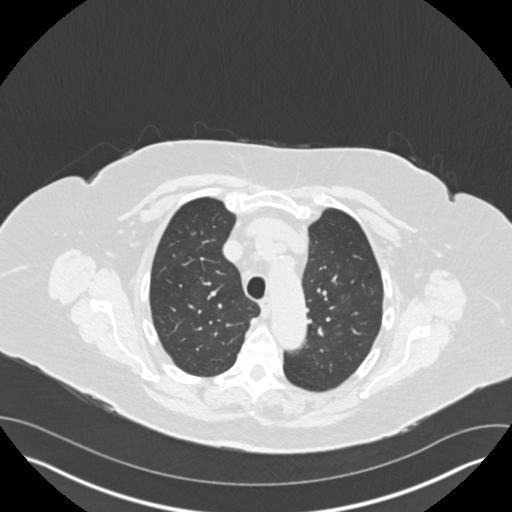
[im 129/153  lung]
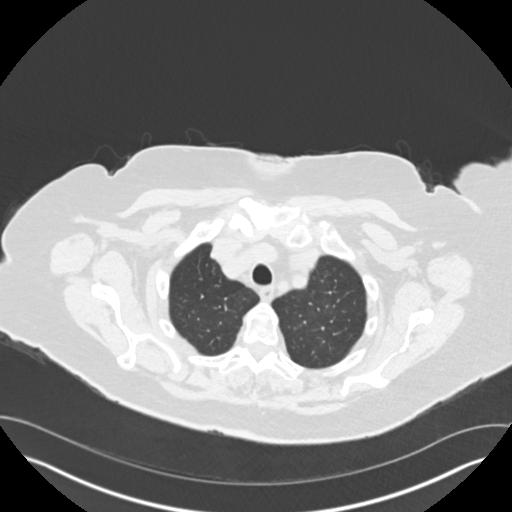
[im 141/153  lung]
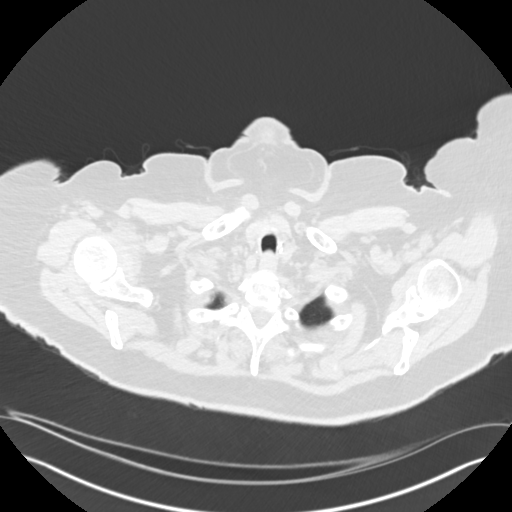

[Series 4: chest 2.00 br40 s3 · coronal · 0.60mm/px · 3 of 209 slices shown (2 of 2)]
[im 42/209  lung]
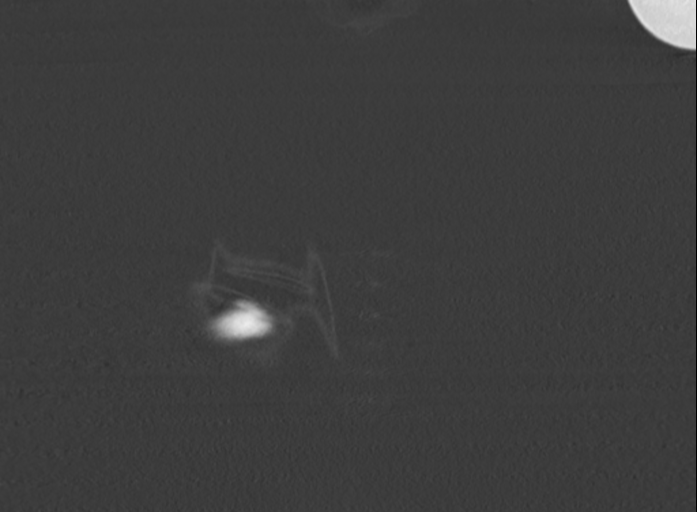
[im 84/209  lung]
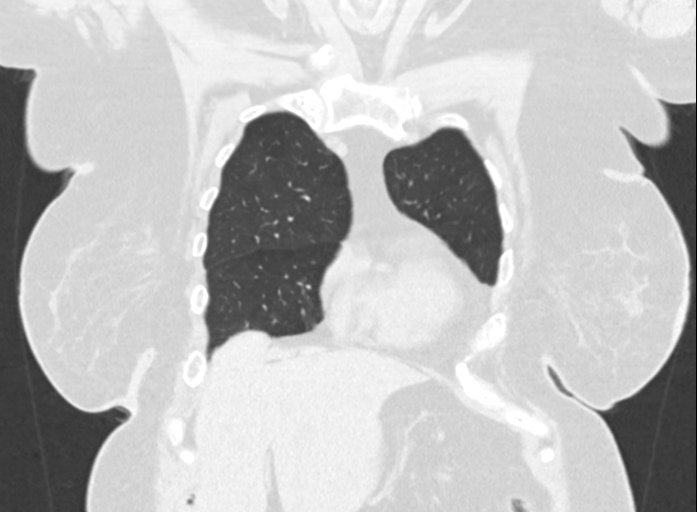
[im 125/209  lung]
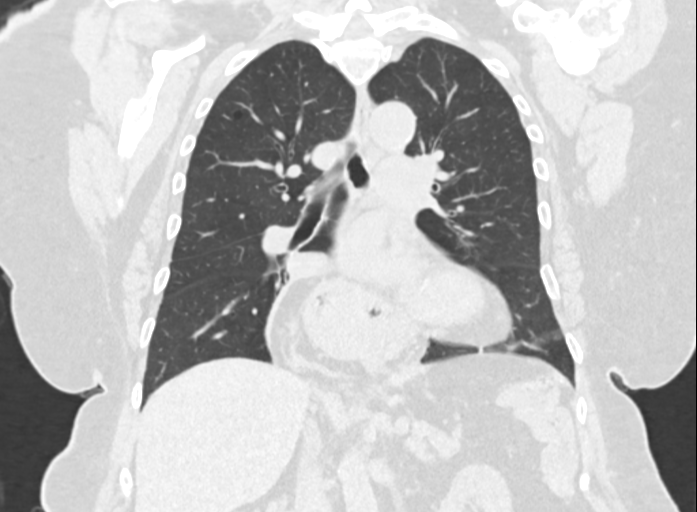

[15 of 36 positions shown; findings below may reference images not displayed]

FINDINGS: Cardiovascular: Heart is normal in size.  No pericardial effusion.

No evidence of thoracic aortic aneurysm.

Coronary atherosclerosis of the LAD.

Mediastinum/Nodes: Small mediastinal lymph nodes which do not meet
pathologic CT size criteria.

Status post thyroidectomy.

Lungs/Pleura: Bilateral pulmonary nodules, including a dominant 7 mm
irregular nodule in the posteromedial left upper lobe (series
8/image 43), previously 6 mm. 5 mm nodule in the posterior left
lower lobe (series 8/image 32), unchanged. 6 mm nodule in the
posterior right lower lobe (series 8/image 92), previously 5 mm.
These findings are suspicious for metastatic disease.

Mild centrilobular and paraseptal emphysematous changes, upper lung
predominant.

No focal consolidation.

No pleural effusion or pneumothorax.

Upper Abdomen: Visualized upper abdomen is notable for a large
hiatal hernia, prior cholecystectomy, and mild vascular
calcifications.

Musculoskeletal: Degenerative changes of the visualized
thoracolumbar spine.
IMPRESSION: Bilateral pulmonary nodules, mildly progressive, suspicious for
metastases.

Aortic Atherosclerosis ([0J]-[0J]) and Emphysema ([0J]-[0J]).

## 2021-05-13 ENCOUNTER — Other Ambulatory Visit (HOSPITAL_COMMUNITY): Payer: Self-pay

## 2021-05-13 ENCOUNTER — Other Ambulatory Visit: Payer: Self-pay | Admitting: Hematology

## 2021-05-13 DIAGNOSIS — C182 Malignant neoplasm of ascending colon: Secondary | ICD-10-CM

## 2021-05-16 ENCOUNTER — Other Ambulatory Visit (HOSPITAL_COMMUNITY): Payer: Self-pay

## 2021-05-16 MED ORDER — CAPECITABINE 500 MG PO TABS
ORAL_TABLET | ORAL | 0 refills | Status: DC
  Filled 2021-05-16: qty 70, 28d supply, fill #0

## 2021-05-18 ENCOUNTER — Other Ambulatory Visit (HOSPITAL_COMMUNITY): Payer: Self-pay

## 2021-05-27 ENCOUNTER — Other Ambulatory Visit: Payer: Self-pay

## 2021-05-27 ENCOUNTER — Inpatient Hospital Stay: Payer: Medicare Other | Attending: Oncology

## 2021-05-27 ENCOUNTER — Inpatient Hospital Stay: Payer: Medicare Other | Attending: Hematology and Oncology

## 2021-05-27 ENCOUNTER — Encounter: Payer: Self-pay | Admitting: Hematology

## 2021-05-27 ENCOUNTER — Inpatient Hospital Stay (HOSPITAL_BASED_OUTPATIENT_CLINIC_OR_DEPARTMENT_OTHER): Payer: Medicare Other | Admitting: Hematology

## 2021-05-27 VITALS — BP 148/6 | HR 67 | Temp 97.7°F | Resp 18 | Wt 189.3 lb

## 2021-05-27 DIAGNOSIS — D509 Iron deficiency anemia, unspecified: Secondary | ICD-10-CM

## 2021-05-27 DIAGNOSIS — J449 Chronic obstructive pulmonary disease, unspecified: Secondary | ICD-10-CM | POA: Diagnosis not present

## 2021-05-27 DIAGNOSIS — C182 Malignant neoplasm of ascending colon: Secondary | ICD-10-CM

## 2021-05-27 DIAGNOSIS — E039 Hypothyroidism, unspecified: Secondary | ICD-10-CM | POA: Insufficient documentation

## 2021-05-27 DIAGNOSIS — G8929 Other chronic pain: Secondary | ICD-10-CM | POA: Diagnosis not present

## 2021-05-27 DIAGNOSIS — K219 Gastro-esophageal reflux disease without esophagitis: Secondary | ICD-10-CM | POA: Diagnosis not present

## 2021-05-27 DIAGNOSIS — Z85850 Personal history of malignant neoplasm of thyroid: Secondary | ICD-10-CM | POA: Diagnosis not present

## 2021-05-27 DIAGNOSIS — M1990 Unspecified osteoarthritis, unspecified site: Secondary | ICD-10-CM | POA: Diagnosis not present

## 2021-05-27 DIAGNOSIS — M25562 Pain in left knee: Secondary | ICD-10-CM | POA: Diagnosis not present

## 2021-05-27 DIAGNOSIS — R918 Other nonspecific abnormal finding of lung field: Secondary | ICD-10-CM | POA: Diagnosis not present

## 2021-05-27 DIAGNOSIS — R197 Diarrhea, unspecified: Secondary | ICD-10-CM | POA: Diagnosis not present

## 2021-05-27 DIAGNOSIS — Z9049 Acquired absence of other specified parts of digestive tract: Secondary | ICD-10-CM | POA: Diagnosis not present

## 2021-05-27 DIAGNOSIS — I10 Essential (primary) hypertension: Secondary | ICD-10-CM | POA: Insufficient documentation

## 2021-05-27 LAB — CBC WITH DIFFERENTIAL/PLATELET
Abs Immature Granulocytes: 0.02 10*3/uL (ref 0.00–0.07)
Basophils Absolute: 0.1 10*3/uL (ref 0.0–0.1)
Basophils Relative: 1 %
Eosinophils Absolute: 0.2 10*3/uL (ref 0.0–0.5)
Eosinophils Relative: 3 %
HCT: 40.2 % (ref 36.0–46.0)
Hemoglobin: 13.4 g/dL (ref 12.0–15.0)
Immature Granulocytes: 0 %
Lymphocytes Relative: 32 %
Lymphs Abs: 2.8 10*3/uL (ref 0.7–4.0)
MCH: 29.6 pg (ref 26.0–34.0)
MCHC: 33.3 g/dL (ref 30.0–36.0)
MCV: 88.7 fL (ref 80.0–100.0)
Monocytes Absolute: 0.7 10*3/uL (ref 0.1–1.0)
Monocytes Relative: 8 %
Neutro Abs: 4.8 10*3/uL (ref 1.7–7.7)
Neutrophils Relative %: 56 %
Platelets: 230 10*3/uL (ref 150–400)
RBC: 4.53 MIL/uL (ref 3.87–5.11)
RDW: 17.2 % — ABNORMAL HIGH (ref 11.5–15.5)
WBC: 8.7 10*3/uL (ref 4.0–10.5)
nRBC: 0 % (ref 0.0–0.2)

## 2021-05-27 LAB — COMPREHENSIVE METABOLIC PANEL
ALT: 9 U/L (ref 0–44)
AST: 13 U/L — ABNORMAL LOW (ref 15–41)
Albumin: 3.9 g/dL (ref 3.5–5.0)
Alkaline Phosphatase: 61 U/L (ref 38–126)
Anion gap: 11 (ref 5–15)
BUN: 17 mg/dL (ref 8–23)
CO2: 28 mmol/L (ref 22–32)
Calcium: 9.4 mg/dL (ref 8.9–10.3)
Chloride: 105 mmol/L (ref 98–111)
Creatinine, Ser: 0.86 mg/dL (ref 0.44–1.00)
GFR, Estimated: >60 mL/min (ref >60)
Glucose, Bld: 109 mg/dL — ABNORMAL HIGH (ref 70–99)
Potassium: 3.8 mmol/L (ref 3.5–5.1)
Sodium: 144 mmol/L (ref 135–145)
Total Bilirubin: 0.9 mg/dL (ref 0.3–1.2)
Total Protein: 7 g/dL (ref 6.5–8.1)

## 2021-05-27 NOTE — Progress Notes (Signed)
Nutrition Follow-up:  Patient with colon cancer followed by Dr Burr Medico.  Currently on xeloda.  S/p lap proximal colectomy on 02/02/21.    Spoke with patient via phone for nutrition follow-up.  Patient reports that she is tired from the chemo pill.  Sees Dr Burr Medico this afternoon.  Says that her appetite is good.  Ate 2 eggs, 3 pieces of bacon and 2 pieces of toast this am for breakfast.  Supper last night was fried chicken, pasta salad and macaroni and cheese.  Denies nausea or diarrhea/constipation.  Patient says "there is nothing wrong with my appetite."    Medications: reviewed  Labs: reviewed  Anthropometrics:   Weight 187 lb 11.2 oz on 8/5  185 lb 6.4 oz on 7/6 183 lb on 6/15 218 lb on 03/2020   NUTRITION DIAGNOSIS: Inadequate oral intake improving   INTERVENTION:  Patient to continue well balanced diet including good sources of protein.   Will discuss fatigue with MD at afternoon visit    MONITORING, EVALUATION, GOAL: weight trends, intake   NEXT VISIT: Friday, October 28 phone call  Kasha Howeth B. Zenia Resides, [place], [place] Registered Dietitian [phone] (mobile)

## 2021-05-27 NOTE — Progress Notes (Signed)
[place]   [phone] [phone]   Clinic Follow up Note   Patient Care Team: Velna Hatchet, MD as PCP - General (Internal Medicine) Stark Klein, MD as Consulting Physician (General Surgery) Ronnette Juniper, MD as Consulting Physician (Gastroenterology) [name], MD as Consulting Physician (Hematology and Oncology) Truitt Merle, MD as Consulting Physician (Hematology and Oncology) Jonnie Finner, RN (Inactive) as Oncology Nurse Navigator  Date of Service:  05/27/2021  CHIEF COMPLAINT: f/u of colon cancer  CURRENT THERAPY:  Adjuvant Xeloda 1046m in the AM and 15051min the PM 2 weeks on/1 week off for 6 months, starting 03/29/21, reduced to 100051mid for 7 days on and 7 days off from 05/27/2021  ASSESSMENT & PLAN:  Angelica [name] a 79 15o. female with   1. Colon adenocarcinoma, pT3N2bM0, stage IIIC, MSS -She was seen to have obstructive colon mass on 01/06/21 CT AP. Her 01/29/21 Colonoscopy showed this to be Moderately differentiated colorectal adenocarcinoma. Staging CT chest showed nonspecific although suspicious pulmonary metastatic disease. -She underwent Partial colectomy on 02/02/21 with Dr ByeBarry Dienesurgical path showed 5.2cm of Invasive colorectal adenocarcinoma was completely removed with clear margins. She did have 7/28 positive lymph nodes. -She began adjuvant Xeloda on 03/29/21. She tolerates relatively well with fatigue -she is clinically doing well but fatigue has gotten worse. Labs reviewed, CBC WNL, CMP and iron are pending. We will continue Xeloda but reduce her dose tp 1000m71md. She started her most recent cycle this past Wednesday, 05/25/21.   2. Symptom Management: diarrhea, GERD -worsened since colectomy -she has started to have some diarrhea from Xeloda. I advised her to hold her dose this evening and restart in the morning. -She has physical therapists coming to her home to work with her.   3.  Bilateral pulmonary  nodules -initially seen on CT 01/30/21, measured 6 mm and smaller and were nonspecific -repeat CT 05/06/21 showed mild progression, suspicious for metastatic disease. -given her age, we will continue to monitor at this point.  4.  Chronic iron deficiency anemia -She has been treated with B12 injections and IV Iron before managed by Dr GorsAlvy Bimleropped in 08/2020. -With her Colon cancer, she had GI blood loss which lead to worsened anemia. After colon surgery, her Hg was 9.4. -resolved since colectomy in 01/2021.    5. Comorbidities: HTN, COPD, Hypothyroidism with remote history of thyroid cancer, Arthritis -She has chronic left knee pain and use to have repeated injections. She notes since long hospitalization she has left leg pain and weakness that radiates to her lower back.   6. Social Support -Her husband and 2 children passed away years ago. She lives alone but has help form her 2 older sisters and her niece [name]. Her niece will be her first point of contact. -She notes she lives off social security and has insurance with [employer] will find financial assistance if needed.      PLAN:  -hold Xeloda tonight due to diarrhea -Continue Xeloda, and reduce to 1000mg93mAM and PM) for 7 days on and 7 days off  -Lab and F/u in 1 month   No problem-specific Assessment & Plan notes found for this encounter.   SUMMARY OF ONCOLOGIC HISTORY: Oncology History Overview Note  Cancer Staging Cancer of ascending colon pT3, pN2b s/p lap proximal colectomy 02/02/2021 Staging form: Colon and Rectum, AJCC 8th Edition - Pathologic stage from 02/02/2021: Stage IIIC (pT3, pN2b, cM0) - Signed by Mackayla Mullins,Burr Medico  Krista Blue, MD on 03/09/2021 Stage prefix: Initial diagnosis Histologic grading system: 4 grade system Histologic grade (G): G2 Residual tumor (R): R0 - None    Cancer of ascending colon pT3, pN2b s/p lap proximal colectomy 02/02/2021   Initial Diagnosis   Cancer of ascending colon pT3, pN2b s/p lap  proximal colectomy 02/02/2021   01/06/2021 Imaging   CT AP  IMPRESSION: 1. Abnormal thickening and luminal narrowing involving the cecum and ascending colon up to roughly the level of the mid ascending colon. There also appears to be contiguous thickening in the terminal ileum. Differential considerations include carcinoma, inflammatory bowel disease or focal colitis also involving the terminal ileum. No evidence of associated small bowel obstruction, abscess or bowel perforation. Correlation with evidence of blood in the stool is recommended as well as direct visualization with colonoscopy. 2. Stable large hiatal hernia. 3. Aortic atherosclerosis without evidence of aneurysm.   Aortic Atherosclerosis (ICD10-I70.0).   01/26/2021 Imaging   IMPRESSION: High-grade proximal large bowel obstruction involving the mid ascending colon. 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   Mild right nonobstructing nephrolithiasis.   Large hiatal hernia   Aortic Atherosclerosis (ICD10-I70.0).     01/29/2021 Procedure   Colonoscopy by Dr Therisa Doyne  IMPRESSION - Preparation of the colon was poor. - Likely malignant completely obstructing tumor in the distal ascending colon. Biopsied. Tattooed. - Stool in the entire examined colon. - Diverticulosis in the sigmoid colon and in the descending colon.   01/29/2021 Initial Biopsy   FINAL MICROSCOPIC DIAGNOSIS:   A. ASCENDING COLON BIOPSY:  - Moderately differentiated colorectal adenocarcinoma.  - See comment.   COMMENT:  Dr. Melina Copa agrees.      01/30/2021 Imaging   CT Chest  IMPRESSION: 1. There are multiple small bilateral pulmonary nodules, measuring 6 mm and smaller, nonspecific although suspicious for pulmonary metastatic disease. These are new compared to very remote  prior CT of the chest dated 08/11/2008 and likely below size threshold for confident PET-CT characterization. 2. Prominent mediastinal lymph nodes, similar in appearance to remote prior CT dated 08/11/2008 and likely benign and reactive. 3. Moderate hiatal hernia with intrathoracic position of the gastric fundus. 4. Esophagogastric tube, tip positioned in the duodenal bulb.   02/02/2021 Surgery   Laparoscopic Assisted Partial Colectomy by Dr Barry Dienes    02/02/2021 Pathology Results   FINAL MICROSCOPIC DIAGNOSIS:   A. COLON, RIGHT, RESECTION:  - Invasive colorectal adenocarcinoma, 5.2 cm.  - Carcinoma extends into pericolonic connective tissue.  - Margins not involved.  - Metastatic carcinoma in seven of twenty eight lymph nodes (7/28).  - One extramural satellite nodule.  - See oncology table.   MSI -Stable   Mismatch repair normal   Preserved nuclear expressoin of MLH1, MSHS2, MSH6, PMS2    02/02/2021 Cancer Staging   Staging form: Colon and Rectum, AJCC 8th Edition - Pathologic stage from 02/02/2021: Stage IIIC (pT3, pN2b, cM0) - Signed by Truitt Merle, MD on 03/09/2021 Stage prefix: Initial diagnosis Histologic grading system: 4 grade system Histologic grade (G): G2 Residual tumor (R): R0 - None   03/28/2021 -  Chemotherapy   PENDING Adjuvant Xeloda 1050m in the AM and 15060min the PM 2 weeks on/1 week off for 6 months starting 03/28/21.        05/06/2021 Imaging   CT Chest w/o contrast  IMPRESSION: Bilateral pulmonary nodules, mildly progressive,  suspicious for metastases.   Aortic Atherosclerosis (ICD10-I70.0) and Emphysema (ICD10-J43.9).      INTERVAL HISTORY:  Angelica [name] is here for a follow up of colon cancer. She was last seen by me on 04/29/21. She presents to the clinic accompanied by her good friend. She reports "feeling under the weather" today. She notes fatigue, as well as continued gas. She notes she began Xeloda on Wednesday, 05/25/21. She reports diarrhea  today that is new. She denies bothersome nausea or vomiting.    All other systems were reviewed with the patient and are negative.  MEDICAL HISTORY:  Past Medical History:  Diagnosis Date   Anemia    Arthritis    Asthma    Celiac disease 11/17/2013   COPD (chronic obstructive pulmonary disease) ([place])    on 2L prn home O2   Hypertension    Rectal bleed 10/23/2018   Thyroid cancer ([place])    remote    SURGICAL HISTORY: Past Surgical History:  Procedure Laterality Date   ABDOMINAL HYSTERECTOMY     BIOPSY  01/29/2021   Procedure: BIOPSY;  Surgeon: Ronnette Juniper, MD;  Location: [facility];  Service: Gastroenterology;;   BREAST EXCISIONAL BIOPSY Bilateral    CHOLECYSTECTOMY     COLONOSCOPY  07/05/2012   Procedure: COLONOSCOPY;  Surgeon: Winfield Cunas., MD;  Location: [facility];  Service: Endoscopy;  Laterality: N/A;   COLONOSCOPY N/A 03/12/2015   Procedure: COLONOSCOPY;  Surgeon: Laurence Spates, MD;  Location: [facility] ENDOSCOPY;  Service: Endoscopy;  Laterality: N/A;   COLONOSCOPY N/A 01/29/2021   Procedure: COLONOSCOPY;  Surgeon: Ronnette Juniper, MD;  Location: [facility] ENDOSCOPY;  Service: Gastroenterology;  Laterality: N/A;   ESOPHAGOGASTRODUODENOSCOPY  07/05/2012   Procedure: ESOPHAGOGASTRODUODENOSCOPY (EGD);  Surgeon: Winfield Cunas., MD;  Location[facility] ENDOSCOPY;  Service: Endoscopy;  Laterality: N/A;   HOT HEMOSTASIS N/A 03/12/2015   Procedure: HOT HEMOSTASIS (ARGON PLASMA COAGULATION/BICAP);  Surgeon: Laurence Spates, MD;  Location: [facility]OS[facility];  Service: Endoscopy;  Laterality: N/A;   LAPAROSCOPIC PARTIAL COLECTOMY N/A 02/02/2021   Procedure: LAPAROSCOPIC ASSISTED PARTIAL COLECTOMY;  Surgeon: Stark Klein, MD;  Location: [facility] ORS;  Service: General;  Laterality: N/A;   SUBMUCOSAL TATTOO INJECTION  01/29/2021   Procedure: SUBMUCOSAL TATTOO INJECTION;  Surgeon: Ronnette Juniper, MD;  Location: [facility] ENDOSCOPY;  Service: Gastroenterology;;   TONSILLECTOMY      I have reviewed the social history and  family history with the patient and they are unchanged from previous note.  ALLERGIES:  is allergic to other, fosamax [alendronate sodium], penicillins, sulfa antibiotics, and codeine.  MEDICATIONS:  Current Outpatient Medications  Medication Sig Dispense Refill   acetaminophen (TYLENOL) 325 MG tablet Take 2 tablets (650 mg total) by mouth every 6 (six) hours as needed for mild pain or fever.     albuterol (PROVENTIL HFA;VENTOLIN HFA) 108 (90 Base) MCG/ACT inhaler Inhale into the lungs every 6 (six) hours as needed for wheezing or shortness of breath.     capecitabine (XELODA) 500 MG tablet Take 2 tablets by mouth in the morning and 3 tablets by mouth in the evening for 7 days then off for 7 days. Take within 30 minutes after meals. Start on 03/28/2021 70 tablet 0   docusate sodium (COLACE) 250 MG capsule Take 250 mg by mouth daily as needed for constipation.     folic acid (FOLVITE) 1 MG tablet Take 1 tablet (1 mg total) by mouth daily. 30 tablet 1   hydrocortisone (ANUSOL-HC) 25 MG suppository Place 1 suppository (  25 mg total) rectally 2 (two) times daily as needed for hemorrhoids or anal itching. (Patient not taking: Reported on 01/27/2021) 12 suppository 0   levothyroxine (SYNTHROID, LEVOTHROID) 125 MCG tablet Take 125 mcg by mouth daily.     ondansetron (ZOFRAN ODT) 8 MG disintegrating tablet Take 1 tablet (8 mg total) by mouth every 8 (eight) hours as needed. 8 tablet 0   OXYGEN Inhale 2 L into the lungs as needed (LOW OXYGEN).     polyethylene glycol (MIRALAX) 17 g packet Take 17 g by mouth daily as needed for mild constipation. 14 each 0   traMADol (ULTRAM) 50 MG tablet Take 1 tablet (50 mg total) by mouth every 6 (six) hours as needed for moderate pain or severe pain (50 mg for moderate, 100 mg for severe). 10 tablet 0   No current facility-administered medications for this visit.    PHYSICAL EXAMINATION: ECOG PERFORMANCE STATUS: 2 - Symptomatic, <50% confined to bed  Vitals:    05/27/21 1518  BP: (!) 148/6  Pulse: 67  Resp: 18  Temp: 97.7 F (36.5 C)  SpO2: 93%   Wt Readings from Last 3 Encounters:  05/27/21 189 lb 4.8 oz (85.9 kg)  04/29/21 187 lb 11.2 oz (85.1 kg)  04/07/21 185 lb 6.4 oz (84.1 kg)     GENERAL:alert, no distress and comfortable SKIN: skin color normal, no rashes or significant lesions EYES: normal, Conjunctiva are pink and non-injected, sclera clear  NEURO: alert & oriented x 3 with fluent speech  LABORATORY DATA:  I have reviewed the data as listed CBC Latest Ref Rng & Units 05/27/2021 04/29/2021 04/07/2021  WBC 4.0 - 10.5 K/uL 8.7 8.3 9.0  Hemoglobin 12.0 - 15.0 g/dL 13.4 13.2 13.2  Hematocrit 36.0 - 46.0 % 40.2 40.2 39.6  Platelets 150 - 400 K/uL 230 230 234     CMP Latest Ref Rng & Units 05/27/2021 04/29/2021 04/07/2021  Glucose 70 - 99 mg/dL 109(H) 97 102(H)  BUN 8 - 23 mg/dL _0 Creatinine 0.44 - 1.00 mg/dL 0.86 0.78 0.78  Sodium 135 - 145 mmol/L 144 142 142  Potassium 3.5 - 5.1 mmol/L 3.8 4.1 3.2(L)  Chloride 98 - 111 mmol/L 105 103 104  CO2 22 - 32 mmol/L _1 Calcium 8.9 - 10.3 mg/dL 9.4 9.4 9.1  Total Protein 6.5 - 8.1 g/dL 7.0 6.9 7.1  Total Bilirubin 0.3 - 1.2 mg/dL 0.9 0.8 0.8  Alkaline Phos 38 - 126 U/L 61 58 58  AST 15 - 41 U/L 13(L) 16 17  ALT 0 - 44 U/L _2 RADIOGRAPHIC STUDIES: I have personally reviewed the radiological images as listed and agreed with the findings in the report. No results found.    No orders of the defined types were placed in this encounter.  All questions were answered. The patient knows to call the clinic with any problems, questions or concerns. No barriers to learning was detected. The total time spent in the appointment was 30 minutes.     Truitt Merle, MD 05/27/2021   I, Wilburn Mylar, am acting as scribe for Truitt Merle, MD.   I have reviewed the above documentation for accuracy and completeness, and I agree with the above.

## 2021-05-31 ENCOUNTER — Other Ambulatory Visit: Payer: Medicare Other

## 2021-05-31 LAB — FERRITIN: Ferritin: 29 ng/mL (ref 11–307)

## 2021-05-31 LAB — IRON AND TIBC
Iron: 66 ug/dL (ref 41–142)
Saturation Ratios: 19 % — ABNORMAL LOW (ref 21–57)
TIBC: 345 ug/dL (ref 236–444)
UIBC: 279 ug/dL (ref 120–384)

## 2021-06-01 ENCOUNTER — Telehealth: Payer: Self-pay | Admitting: Hematology

## 2021-06-01 NOTE — Telephone Encounter (Signed)
Scheduled follow-up appointment per 9/2 los. Patient is aware.

## 2021-06-03 ENCOUNTER — Ambulatory Visit: Payer: Medicare Other | Admitting: Hematology

## 2021-06-13 ENCOUNTER — Other Ambulatory Visit (HOSPITAL_COMMUNITY): Payer: Self-pay

## 2021-06-14 ENCOUNTER — Telehealth: Payer: Self-pay

## 2021-06-14 NOTE — Telephone Encounter (Addendum)
Oral Oncology Pharmacist Encounter  Attempted to call patient regarding Xeloda medication and potentially restarting prior to the next appointment with Dr. Burr Medico. Unable to reach the patient but will try again later this afternoon.   Drema Halon, PharmD Hematology/Oncology Clinical Pharmacist [facility] [phone]

## 2021-06-15 NOTE — Telephone Encounter (Signed)
Oral Oncology Pharmacist Encounter  Spoke to Angelica [name] regarding xeloda medication and taking for 7 days on to restart prior to the next appointment with Dr. Burr Medico. Due to side effects she is experiencing she did not want to restart medication. I notified Dr. Burr Medico.   Drema Halon, PharmD Hematology/Oncology Clinical Pharmacist [facility] [phone]

## 2021-06-21 ENCOUNTER — Other Ambulatory Visit (HOSPITAL_COMMUNITY): Payer: Self-pay

## 2021-06-23 ENCOUNTER — Encounter: Payer: Self-pay | Admitting: Hematology

## 2021-06-23 ENCOUNTER — Other Ambulatory Visit (HOSPITAL_COMMUNITY): Payer: Self-pay

## 2021-06-23 ENCOUNTER — Other Ambulatory Visit: Payer: Self-pay

## 2021-06-23 ENCOUNTER — Inpatient Hospital Stay (HOSPITAL_BASED_OUTPATIENT_CLINIC_OR_DEPARTMENT_OTHER): Payer: Medicare Other | Admitting: Hematology

## 2021-06-23 ENCOUNTER — Inpatient Hospital Stay: Payer: Medicare Other

## 2021-06-23 VITALS — BP 125/77 | HR 74 | Temp 98.0°F | Resp 19 | Ht 63.0 in | Wt 193.3 lb

## 2021-06-23 DIAGNOSIS — C182 Malignant neoplasm of ascending colon: Secondary | ICD-10-CM | POA: Diagnosis not present

## 2021-06-23 DIAGNOSIS — D509 Iron deficiency anemia, unspecified: Secondary | ICD-10-CM

## 2021-06-23 LAB — CBC WITH DIFFERENTIAL/PLATELET
Abs Immature Granulocytes: 0.02 10*3/uL (ref 0.00–0.07)
Basophils Absolute: 0.1 10*3/uL (ref 0.0–0.1)
Basophils Relative: 1 %
Eosinophils Absolute: 0.3 10*3/uL (ref 0.0–0.5)
Eosinophils Relative: 3 %
HCT: 38 % (ref 36.0–46.0)
Hemoglobin: 12.7 g/dL (ref 12.0–15.0)
Immature Granulocytes: 0 %
Lymphocytes Relative: 35 %
Lymphs Abs: 2.9 10*3/uL (ref 0.7–4.0)
MCH: 30.4 pg (ref 26.0–34.0)
MCHC: 33.4 g/dL (ref 30.0–36.0)
MCV: 90.9 fL (ref 80.0–100.0)
Monocytes Absolute: 0.8 10*3/uL (ref 0.1–1.0)
Monocytes Relative: 9 %
Neutro Abs: 4.3 10*3/uL (ref 1.7–7.7)
Neutrophils Relative %: 52 %
Platelets: 253 10*3/uL (ref 150–400)
RBC: 4.18 MIL/uL (ref 3.87–5.11)
RDW: 17.6 % — ABNORMAL HIGH (ref 11.5–15.5)
WBC: 8.4 10*3/uL (ref 4.0–10.5)
nRBC: 0 % (ref 0.0–0.2)

## 2021-06-23 LAB — COMPREHENSIVE METABOLIC PANEL
ALT: 8 U/L (ref 0–44)
AST: 14 U/L — ABNORMAL LOW (ref 15–41)
Albumin: 3.8 g/dL (ref 3.5–5.0)
Alkaline Phosphatase: 62 U/L (ref 38–126)
Anion gap: 11 (ref 5–15)
BUN: 13 mg/dL (ref 8–23)
CO2: 27 mmol/L (ref 22–32)
Calcium: 9 mg/dL (ref 8.9–10.3)
Chloride: 106 mmol/L (ref 98–111)
Creatinine, Ser: 0.78 mg/dL (ref 0.44–1.00)
GFR, Estimated: >60 mL/min (ref >60)
Glucose, Bld: 92 mg/dL (ref 70–99)
Potassium: 3.4 mmol/L — ABNORMAL LOW (ref 3.5–5.1)
Sodium: 144 mmol/L (ref 135–145)
Total Bilirubin: 0.8 mg/dL (ref 0.3–1.2)
Total Protein: 7 g/dL (ref 6.5–8.1)

## 2021-06-23 LAB — IRON AND TIBC
Iron: 43 ug/dL (ref 41–142)
Saturation Ratios: 13 % — ABNORMAL LOW (ref 21–57)
TIBC: 335 ug/dL (ref 236–444)
UIBC: 292 ug/dL (ref 120–384)

## 2021-06-23 LAB — FERRITIN: Ferritin: 27 ng/mL (ref 11–307)

## 2021-06-23 NOTE — Progress Notes (Signed)
[place]   [phone]) [phone] Fax:(336) [phone]   Clinic Follow up Note   Patient Care Team: Velna Hatchet, MD as PCP - General (Internal Medicine) Stark Klein, MD as Consulting Physician (General Surgery) Ronnette Juniper, MD as Consulting Physician (Gastroenterology) [name], MD as Consulting Physician (Hematology and Oncology) Truitt Merle, MD as Consulting Physician (Hematology and Oncology) Jonnie Finner, RN (Inactive) as Oncology Nurse Navigator  Date of Service:  06/23/2021  CHIEF COMPLAINT: f/u of colon cancer  CURRENT THERAPY:  Surveillance  ASSESSMENT & PLAN:  Angelica [name] is a 80 y.o. female with   1. Colon adenocarcinoma, pT3N2bM0, stage IIIC, MSS -She presented with abdominal pain, nausea, diarrhea, and bloating. She was seen to have obstructive colon mass on 01/06/21 CT AP. Her 01/29/21 Colonoscopy showed this to be Moderately differentiated colorectal adenocarcinoma. Staging CT chest showed nonspecific although suspicious pulmonary metastatic disease. -She underwent Partial colectomy on 02/02/21 with Dr Barry Dienes. Surgical path showed 5.2cm of Invasive colorectal adenocarcinoma was completely removed with clear margins. She did have 7/28 positive lymph nodes. -She began adjuvant Xeloda on 03/29/21. She initially tolerated relatively well with fatigue. With her most recent cycle starting 05/25/21, evern with dose reduction, she had worsening fatigue, some SOB, and lower leg pain (knees, calf areas). She stopped taking Xeloda on her own.  She took it for 2 months, we originally planned 6 month therapy.  - We will move to surveillance for now. -f/u in 3 months, with lab and CT CAP a few days before   2. Symptom Management: diarrhea, GERD -worsened since colectomy -she has started to have some diarrhea from Xeloda. I advised her to hold her dose this evening and restart in the morning. -She has physical therapists coming to her home to work with her.    3.  Bilateral pulmonary nodules -initially seen on CT 01/30/21, measured 6 mm and smaller and were nonspecific -repeat CT 05/06/21 showed mild progression, suspicious for metastatic disease. -given her age, we will continue to monitor at this point.   4.  Chronic iron deficiency anemia -She has been treated with B12 injections and IV Iron before managed by Dr Alvy Bimler, stopped in 08/2020. -With her Colon cancer, she had GI blood loss which lead to worsened anemia. After colon surgery, her Hg was 9.4. -resolved since colectomy in 01/2021.    5. Comorbidities: HTN, COPD, Hypothyroidism with remote history of thyroid cancer, Arthritis -She has chronic left knee pain and use to have repeated injections. She notes since long hospitalization she has left leg pain and weakness that radiates to her lower back.   6. Social Support -Her husband and 2 children passed away years ago. She lives alone but has help form her 2 older sisters and her niece Quita Skye and Friend. Her niece will be her first point of contact. -She notes she lives off social security and has insurance with [employer]. We will find financial assistance if needed.      PLAN:  -stop Xeloda due to poor tolerance  -f/u in 3 months with lab and CT CAP w/ contrast a few days before   No problem-specific Assessment & Plan notes found for this encounter.   SUMMARY OF ONCOLOGIC HISTORY: Oncology History Overview Note  Cancer Staging Cancer of ascending colon pT3, pN2b s/p lap proximal colectomy 02/02/2021 Staging form: Colon and Rectum, AJCC 8th Edition - Pathologic stage from 02/02/2021: Stage IIIC (pT3, pN2b, cM0) - Signed by Truitt Merle, MD on 03/09/2021 Stage prefix:  Initial diagnosis Histologic grading system: 4 grade system Histologic grade (G): G2 Residual tumor (R): R0 - None    Cancer of ascending colon pT3, pN2b s/p lap proximal colectomy 02/02/2021   Initial Diagnosis   Cancer of ascending colon pT3, pN2b s/p lap proximal colectomy  02/02/2021   01/06/2021 Imaging   CT AP  IMPRESSION: 1. Abnormal thickening and luminal narrowing involving the cecum and ascending colon up to roughly the level of the mid ascending colon. There also appears to be contiguous thickening in the terminal ileum. Differential considerations include carcinoma, inflammatory bowel disease or focal colitis also involving the terminal ileum. No evidence of associated small bowel obstruction, abscess or bowel perforation. Correlation with evidence of blood in the stool is recommended as well as direct visualization with colonoscopy. 2. Stable large hiatal hernia. 3. Aortic atherosclerosis without evidence of aneurysm.   Aortic Atherosclerosis (ICD10-I70.0).   01/26/2021 Imaging   IMPRESSION: High-grade proximal large bowel obstruction involving the mid ascending colon. 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   Mild right nonobstructing nephrolithiasis.   Large hiatal hernia   Aortic Atherosclerosis (ICD10-I70.0).     01/29/2021 Procedure   Colonoscopy by Dr Therisa Doyne  IMPRESSION - Preparation of the colon was poor. - Likely malignant completely obstructing tumor in the distal ascending colon. Biopsied. Tattooed. - Stool in the entire examined colon. - Diverticulosis in the sigmoid colon and in the descending colon.   01/29/2021 Initial Biopsy   FINAL MICROSCOPIC DIAGNOSIS:   A. ASCENDING COLON BIOPSY:  - Moderately differentiated colorectal adenocarcinoma.  - See comment.   COMMENT:  Dr. Melina Copa agrees.      01/30/2021 Imaging   CT Chest  IMPRESSION: 1. There are multiple small bilateral pulmonary nodules, measuring 6 mm and smaller, nonspecific although suspicious for pulmonary metastatic disease. These are new compared to very remote prior CT of the  chest dated 08/11/2008 and likely below size threshold for confident PET-CT characterization. 2. Prominent mediastinal lymph nodes, similar in appearance to remote prior CT dated 08/11/2008 and likely benign and reactive. 3. Moderate hiatal hernia with intrathoracic position of the gastric fundus. 4. Esophagogastric tube, tip positioned in the duodenal bulb.   02/02/2021 Surgery   Laparoscopic Assisted Partial Colectomy by Dr Barry Dienes    02/02/2021 Pathology Results   FINAL MICROSCOPIC DIAGNOSIS:   A. COLON, RIGHT, RESECTION:  - Invasive colorectal adenocarcinoma, 5.2 cm.  - Carcinoma extends into pericolonic connective tissue.  - Margins not involved.  - Metastatic carcinoma in seven of twenty eight lymph nodes (7/28).  - One extramural satellite nodule.  - See oncology table.   MSI -Stable   Mismatch repair normal   Preserved nuclear expressoin of MLH1, MSHS2, MSH6, PMS2    02/02/2021 Cancer Staging   Staging form: Colon and Rectum, AJCC 8th Edition - Pathologic stage from 02/02/2021: Stage IIIC (pT3, pN2b, cM0) - Signed by Truitt Merle, MD on 03/09/2021 Stage prefix: Initial diagnosis Histologic grading system: 4 grade system Histologic grade (G): G2 Residual tumor (R): R0 - None   03/28/2021 -  Chemotherapy   PENDING Adjuvant Xeloda 1027m in the AM and 15084min the PM 2 weeks on/1 week off for 6 months starting 03/28/21.        05/06/2021 Imaging   CT Chest w/o contrast  IMPRESSION: Bilateral pulmonary nodules, mildly progressive, suspicious for metastases.   Aortic  Atherosclerosis (ICD10-I70.0) and Emphysema (ICD10-J43.9).      INTERVAL HISTORY:  Angelica [name] is here for a follow up of colon cancer. She was last seen by me on 05/27/21. She presents to the clinic accompanied by her sister, Barnetta Chapel. She reports she had increased leg pain, fatigue, and some SOB with her most recent Xeloda cycle. She notes she feels mostly back to normal, except for her leg pain. She does  not want to go back on the Xeloda. She notes, however, that she would be willing to go back on it if we see signs of disease progression-- "if you say I need it, then I'll take it." She reports a lot of gas and occasional diarrhea (depending on what she eats).   All other systems were reviewed with the patient and are negative.  MEDICAL HISTORY:  Past Medical History:  Diagnosis Date   Anemia    Arthritis    Asthma    Celiac disease 11/17/2013   COPD (chronic obstructive pulmonary disease) ([place])    on 2L prn home O2   Hypertension    Rectal bleed 10/23/2018   Thyroid cancer ([place])    remote    SURGICAL HISTORY: Past Surgical History:  Procedure Laterality Date   ABDOMINAL HYSTERECTOMY     BIOPSY  01/29/2021   Procedure: BIOPSY;  Surgeon: Ronnette Juniper, MD;  Location: [facility];  Service: Gastroenterology;;   BREAST EXCISIONAL BIOPSY Bilateral    CHOLECYSTECTOMY     COLONOSCOPY  07/05/2012   Procedure: COLONOSCOPY;  Surgeon: Winfield Cunas., MD;  Location: [facility] ENDOSCOPY;  Service: Endoscopy;  Laterality: N/A;   COLONOSCOPY N/A 03/12/2015   Procedure: COLONOSCOPY;  Surgeon: Laurence Spates, MD;  Location: [facility] ENDOSCOPY;  Service: Endoscopy;  Laterality: N/A;   COLONOSCOPY N/A 01/29/2021   Procedure: COLONOSCOPY;  Surgeon: Ronnette Juniper, MD;  Location: [facility] ENDOSCOPY;  Service: Gastroenterology;  Laterality: N/A;   ESOPHAGOGASTRODUODENOSCOPY  07/05/2012   Procedure: ESOPHAGOGASTRODUODENOSCOPY (EGD);  Surgeon: Winfield Cunas., MD;  Location: [facility] ENDOSCOPY;  Service: Endoscopy;  Laterality: N/A;   HOT HEMOSTASIS N/A 03/12/2015   Procedure: HOT HEMOSTASIS (ARGON PLASMA COAGULATION/BICAP);  Surgeon: Laurence Spates, MD;  Location: [facility] ENDOS[facility];  Service: Endoscopy;  Laterality: N/A;   LAPAROSCOPIC PARTIAL COLECTOMY N/A 02/02/2021   Procedure: LAPAROSCOPIC ASSISTED PARTIAL COLECTOMY;  Surgeon: Stark Klein, MD;  Location: [facility] ORS;  Service: General;  Laterality: N/A;   SUBMUCOSAL TATTOO  INJECTION  01/29/2021   Procedure: SUBMUCOSAL TATTOO INJECTION;  Surgeon: Ronnette Juniper, MD;  Location: [facility] ENDOSCOPY;  Service: Gastroenterology;;   TONSILLECTOMY      I have reviewed the social history and family history with the patient and they are unchanged from previous note.  ALLERGIES:  is allergic to other, fosamax [alendronate sodium], penicillins, sulfa antibiotics, and codeine.  MEDICATIONS:  Current Outpatient Medications  Medication Sig Dispense Refill   acetaminophen (TYLENOL) 325 MG tablet Take 2 tablets (650 mg total) by mouth every 6 (six) hours as needed for mild pain or fever.     albuterol (PROVENTIL HFA;VENTOLIN HFA) 108 (90 Base) MCG/ACT inhaler Inhale into the lungs every 6 (six) hours as needed for wheezing or shortness of breath.     capecitabine (XELODA) 500 MG tablet Take 2 tablets by mouth in the morning and 3 tablets by mouth in the evening for 7 days then off for 7 days. Take within 30 minutes after meals. Start on 03/28/2021 70 tablet 0   docusate sodium (COLACE) 250 MG capsule Take  250 mg by mouth daily as needed for constipation.     folic acid (FOLVITE) 1 MG tablet Take 1 tablet (1 mg total) by mouth daily. 30 tablet 1   hydrocortisone (ANUSOL-HC) 25 MG suppository Place 1 suppository (25 mg total) rectally 2 (two) times daily as needed for hemorrhoids or anal itching. (Patient not taking: Reported on 01/27/2021) 12 suppository 0   levothyroxine (SYNTHROID, LEVOTHROID) 125 MCG tablet Take 125 mcg by mouth daily.     ondansetron (ZOFRAN ODT) 8 MG disintegrating tablet Take 1 tablet (8 mg total) by mouth every 8 (eight) hours as needed. 8 tablet 0   OXYGEN Inhale 2 L into the lungs as needed (LOW OXYGEN).     polyethylene glycol (MIRALAX) 17 g packet Take 17 g by mouth daily as needed for mild constipation. 14 each 0   traMADol (ULTRAM) 50 MG tablet Take 1 tablet (50 mg total) by mouth every 6 (six) hours as needed for moderate pain or severe pain (50 mg for moderate,  100 mg for severe). 10 tablet 0   No current facility-administered medications for this visit.    PHYSICAL EXAMINATION: ECOG PERFORMANCE STATUS: 2 - Symptomatic, <50% confined to bed  Vitals:   06/23/21 1410  BP: 125/77  Pulse: 74  Resp: 19  Temp: 98 F (36.7 C)  SpO2: 94%   Wt Readings from Last 3 Encounters:  06/23/21 193 lb 4.8 oz (87.7 kg)  05/27/21 189 lb 4.8 oz (85.9 kg)  04/29/21 187 lb 11.2 oz (85.1 kg)     GENERAL:alert, no distress and comfortable SKIN: skin color normal, no rashes or significant lesions EYES: normal, Conjunctiva are pink and non-injected, sclera clear  NEURO: alert & oriented x 3 with fluent speech  LABORATORY DATA:  I have reviewed the data as listed CBC Latest Ref Rng & Units 06/23/2021 05/27/2021 04/29/2021  WBC 4.0 - 10.5 K/uL 8.4 8.7 8.3  Hemoglobin 12.0 - 15.0 g/dL 12.7 13.4 13.2  Hematocrit 36.0 - 46.0 % 38.0 40.2 40.2  Platelets 150 - 400 K/uL 253 230 230     CMP Latest Ref Rng & Units 06/23/2021 05/27/2021 04/29/2021  Glucose 70 - 99 mg/dL 92 109(H) 97  BUN 8 - 23 mg/dL 13 17 16   Creatinine 0.44 - 1.00 mg/dL 0.78 0.86 0.78  Sodium 135 - 145 mmol/L 144 144 142  Potassium 3.5 - 5.1 mmol/L 3.4(L) 3.8 4.1  Chloride 98 - 111 mmol/L 106 105 103  CO2 22 - 32 mmol/L 27 28 29   Calcium 8.9 - 10.3 mg/dL 9.0 9.4 9.4  Total Protein 6.5 - 8.1 g/dL 7.0 7.0 6.9  Total Bilirubin 0.3 - 1.2 mg/dL 0.8 0.9 0.8  Alkaline Phos 38 - 126 U/L 62 61 58  AST 15 - 41 U/L 14(L) 13(L) 16  ALT 0 - 44 U/L 8 9 10       RADIOGRAPHIC STUDIES: I have personally reviewed the radiological images as listed and agreed with the findings in the report. No results found.    Orders Placed This Encounter  Procedures   CT CHEST ABDOMEN PELVIS W CONTRAST    Standing Status:   Future    Standing Expiration Date:   06/23/2022    Order Specific Question:   Preferred imaging location?    Answer:   [facility]    Order Specific Question:   Release to patient     Answer:   Immediate    Order Specific Question:   Is Oral Contrast  requested for this exam?    Answer:   Yes, Per Radiology protocol   All questions were answered. The patient knows to call the clinic with any problems, questions or concerns. No barriers to learning was detected. The total time spent in the appointment was 30 minutes.     Truitt Merle, MD 06/23/2021   I, Wilburn Mylar, am acting as scribe for Truitt Merle, MD.   I have reviewed the above documentation for accuracy and completeness, and I agree with the above.

## 2021-07-18 ENCOUNTER — Other Ambulatory Visit (HOSPITAL_COMMUNITY): Payer: Self-pay

## 2021-07-22 ENCOUNTER — Inpatient Hospital Stay: Payer: BLUE CROSS/BLUE SHIELD | Attending: Oncology

## 2021-07-22 NOTE — Progress Notes (Signed)
Nutrition Follow-up:  Patient with colon cancer followed by Dr Burr Medico.  Currently on xeloda.  S/p lap proximal colectomy on 02/02/21.    Spoke with patient via phone.  Patient reports that appetite is still good.  Says she is getting ready to go to the grocery store.  Reports that she stopped taking the chemo pill on 9/20 due to not feeling well.  MD aware.  Patient currently on surveillance.  Patient says that she is eating meats, eggs, vegetables.  Cooks for herself and would rather cook than go out to eat.    Denies any nutrition impact symptoms    Medications: reviewed  Labs: reviewed  Anthropometrics:   Weight 193 lb on 9/29  185 lb 6.4 on 7/6 218 lb on 03/2020    NUTRITION DIAGNOSIS: Inadequate oral intake improved    INTERVENTION:  Encouraged well balanced diet, including lean protein foods and plant foods.  Contact information provided and encouraged patient to reach out if has further questions or concerns     NEXT VISIT: no follow-up RD available as needed  Lamarr Feenstra B. Zenia Resides, [place], [place] Registered Dietitian [phone] (mobile)

## 2021-09-07 ENCOUNTER — Other Ambulatory Visit (HOSPITAL_COMMUNITY): Payer: Self-pay

## 2021-09-13 ENCOUNTER — Other Ambulatory Visit (HOSPITAL_COMMUNITY): Payer: Self-pay

## 2021-09-20 ENCOUNTER — Other Ambulatory Visit: Payer: Self-pay

## 2021-09-20 ENCOUNTER — Ambulatory Visit (HOSPITAL_COMMUNITY)
Admission: RE | Admit: 2021-09-20 | Discharge: 2021-09-20 | Disposition: A | Discharge status: Home / Self Care | Payer: Medicare Other | Source: Ambulatory Visit | Attending: Hematology | Admitting: Hematology

## 2021-09-20 ENCOUNTER — Encounter (HOSPITAL_COMMUNITY): Payer: Self-pay

## 2021-09-20 ENCOUNTER — Inpatient Hospital Stay: Payer: Medicare Other | Attending: Hematology

## 2021-09-20 DIAGNOSIS — Z85850 Personal history of malignant neoplasm of thyroid: Secondary | ICD-10-CM | POA: Insufficient documentation

## 2021-09-20 DIAGNOSIS — C182 Malignant neoplasm of ascending colon: Secondary | ICD-10-CM | POA: Diagnosis present

## 2021-09-20 DIAGNOSIS — D509 Iron deficiency anemia, unspecified: Secondary | ICD-10-CM

## 2021-09-20 DIAGNOSIS — I10 Essential (primary) hypertension: Secondary | ICD-10-CM | POA: Insufficient documentation

## 2021-09-20 DIAGNOSIS — Z90710 Acquired absence of both cervix and uterus: Secondary | ICD-10-CM | POA: Insufficient documentation

## 2021-09-20 DIAGNOSIS — C7800 Secondary malignant neoplasm of unspecified lung: Secondary | ICD-10-CM | POA: Insufficient documentation

## 2021-09-20 DIAGNOSIS — C786 Secondary malignant neoplasm of retroperitoneum and peritoneum: Secondary | ICD-10-CM | POA: Insufficient documentation

## 2021-09-20 LAB — CBC WITH DIFFERENTIAL/PLATELET
Abs Immature Granulocytes: 0.01 10*3/uL (ref 0.00–0.07)
Basophils Absolute: 0.1 10*3/uL (ref 0.0–0.1)
Basophils Relative: 1 %
Eosinophils Absolute: 0.4 10*3/uL (ref 0.0–0.5)
Eosinophils Relative: 4 %
HCT: 40.8 % (ref 36.0–46.0)
Hemoglobin: 12.9 g/dL (ref 12.0–15.0)
Immature Granulocytes: 0 %
Lymphocytes Relative: 39 %
Lymphs Abs: 3.2 10*3/uL (ref 0.7–4.0)
MCH: 28.5 pg (ref 26.0–34.0)
MCHC: 31.6 g/dL (ref 30.0–36.0)
MCV: 90.3 fL (ref 80.0–100.0)
Monocytes Absolute: 0.8 10*3/uL (ref 0.1–1.0)
Monocytes Relative: 10 %
Neutro Abs: 3.8 10*3/uL (ref 1.7–7.7)
Neutrophils Relative %: 46 %
Platelets: 262 10*3/uL (ref 150–400)
RBC: 4.52 MIL/uL (ref 3.87–5.11)
RDW: 12.6 % (ref 11.5–15.5)
WBC: 8.2 10*3/uL (ref 4.0–10.5)
nRBC: 0 % (ref 0.0–0.2)

## 2021-09-20 LAB — FERRITIN: Ferritin: 9 ng/mL — ABNORMAL LOW (ref 11–307)

## 2021-09-20 LAB — COMPREHENSIVE METABOLIC PANEL
ALT: 8 U/L (ref 0–44)
AST: 12 U/L — ABNORMAL LOW (ref 15–41)
Albumin: 4 g/dL (ref 3.5–5.0)
Alkaline Phosphatase: 59 U/L (ref 38–126)
Anion gap: 7 (ref 5–15)
BUN: 12 mg/dL (ref 8–23)
CO2: 28 mmol/L (ref 22–32)
Calcium: 9.2 mg/dL (ref 8.9–10.3)
Chloride: 105 mmol/L (ref 98–111)
Creatinine, Ser: 0.79 mg/dL (ref 0.44–1.00)
GFR, Estimated: >60 mL/min (ref >60)
Glucose, Bld: 95 mg/dL (ref 70–99)
Potassium: 3.4 mmol/L — ABNORMAL LOW (ref 3.5–5.1)
Sodium: 140 mmol/L (ref 135–145)
Total Bilirubin: 0.9 mg/dL (ref 0.3–1.2)
Total Protein: 7 g/dL (ref 6.5–8.1)

## 2021-09-20 LAB — IRON AND IRON BINDING CAPACITY (CC-WL,HP ONLY)
Iron: 49 ug/dL (ref 28–170)
Saturation Ratios: 11 % (ref 10.4–31.8)
TIBC: 431 ug/dL (ref 250–450)
UIBC: 382 ug/dL (ref 148–442)

## 2021-09-20 MED ORDER — IOHEXOL 350 MG/ML SOLN
75.0000 mL | Freq: Once | INTRAVENOUS | Status: AC | PRN
  Administered 2021-09-20: 15:00:00 75 mL via INTRAVENOUS

## 2021-09-20 MED ORDER — SODIUM CHLORIDE (PF) 0.9 % IJ SOLN
INTRAMUSCULAR | Status: AC
  Filled 2021-09-20: qty 50

## 2021-09-20 NOTE — Progress Notes (Signed)
Error. Angelica [name] Angelica Jaleeyah Munce, RN  

## 2021-09-22 ENCOUNTER — Other Ambulatory Visit: Payer: BLUE CROSS/BLUE SHIELD

## 2021-09-22 ENCOUNTER — Other Ambulatory Visit: Payer: Self-pay

## 2021-09-22 ENCOUNTER — Inpatient Hospital Stay (HOSPITAL_BASED_OUTPATIENT_CLINIC_OR_DEPARTMENT_OTHER): Payer: Medicare Other | Admitting: Hematology

## 2021-09-22 ENCOUNTER — Encounter: Payer: Self-pay | Admitting: Hematology

## 2021-09-22 VITALS — BP 151/75 | HR 81 | Temp 98.1°F | Resp 18 | Ht 63.0 in | Wt 200.6 lb

## 2021-09-22 DIAGNOSIS — C7800 Secondary malignant neoplasm of unspecified lung: Secondary | ICD-10-CM | POA: Diagnosis not present

## 2021-09-22 DIAGNOSIS — Z85850 Personal history of malignant neoplasm of thyroid: Secondary | ICD-10-CM | POA: Diagnosis not present

## 2021-09-22 DIAGNOSIS — C786 Secondary malignant neoplasm of retroperitoneum and peritoneum: Secondary | ICD-10-CM | POA: Diagnosis not present

## 2021-09-22 DIAGNOSIS — C182 Malignant neoplasm of ascending colon: Secondary | ICD-10-CM

## 2021-09-22 DIAGNOSIS — I10 Essential (primary) hypertension: Secondary | ICD-10-CM | POA: Diagnosis not present

## 2021-09-22 DIAGNOSIS — D509 Iron deficiency anemia, unspecified: Secondary | ICD-10-CM | POA: Diagnosis not present

## 2021-09-22 DIAGNOSIS — Z90710 Acquired absence of both cervix and uterus: Secondary | ICD-10-CM | POA: Diagnosis not present

## 2021-09-22 MED ORDER — FOLIC ACID 1 MG PO TABS: 1.0000 mg | ORAL_TABLET | Freq: Every day | ORAL | 3 refills | Status: DC

## 2021-09-22 NOTE — Progress Notes (Signed)
[place]   [phone] [phone]   Clinic Follow up Note   Patient Care Team: Velna Hatchet, MD as PCP - General (Internal Medicine) Stark Klein, MD as Consulting Physician (General Surgery) Ronnette Juniper, MD as Consulting Physician (Gastroenterology) [name], MD as Consulting Physician (Hematology and Oncology) Truitt Merle, MD as Consulting Physician (Hematology and Oncology) Jonnie Finner, RN (Inactive) as Oncology Nurse Navigator  Date of Service:  09/22/2021  CHIEF COMPLAINT: f/u of colon cancer  CURRENT THERAPY:  Surveillance  ASSESSMENT & PLAN:  Angelica [name] is a 80 y.o. female with   1. Colon adenocarcinoma, pT3N2bM0, stage IIIC, MSS, peritoneal and lung metastatic recurrence in 08/2021 -She presented with abdominal pain, nausea, diarrhea, and bloating. She was seen to have obstructive colon mass on 01/06/21 CT AP. Her 01/29/21 Colonoscopy showed this to be Moderately differentiated colorectal adenocarcinoma. Staging CT chest showed nonspecific although suspicious pulmonary metastatic disease. -She underwent Partial colectomy on 02/02/21 with Dr Barry Dienes. Surgical path showed 5.2cm of Invasive colorectal adenocarcinoma was completely removed with clear margins. She did have 7/28 positive lymph nodes. -She began adjuvant Xeloda on 03/29/21. She initially tolerated relatively well with fatigue. But  she had worsening fatigue, some SOB, and lower leg pain (knees, calf areas) in subsequent cycles. She stopped taking Xeloda on her own.  She took it for 2 months, we originally planned 6 month therapy. She was moved to surveillance. -repeat CT CAP on 09/20/21 showed progression in pulmonary metastatic disease and new omental nodularity and nodular soft tissue mass within midline incision which is palpable on exam. I reviewed the images and discussed the results with the patient and her sister today. I discussed that I feel there is a 82% certainty  that this is cancer recurrence. I offered her to undergo PET scan or a biopsy if she wants to know for certain. -we discussed management options I reviewed that she did not tolerate Xeloda well previously. Given her age and comorbidities, I would not recommend any other form of treatment for her.  -I discussed palliative care and hospice with pt today, which I think she would benefit from to maintain QOL. She initially was quite resistant to hospice, but after my explanation, she would like to consider her options and discuss with her family. She will call me back in a few weeks  2. Goal of care discussion  -We again discussed the incurable nature of her cancer, and the overall poor prognosis, especially considering her poor tolerance of chemo Xeloda. -we also discussed involving palliative care and/or hospice care to help keep up her quality of life.   3.  Bilateral pulmonary nodules -initially seen on CT 01/30/21, measured 6 mm and smaller and were nonspecific -progression on 05/06/21 and 09/20/21 CT scans.   4.  Chronic iron deficiency anemia -She has been treated with B12 injections and IV Iron before managed by Dr Alvy Bimler, stopped in 08/2020. -With her Colon cancer, she had GI blood loss which lead to worsened anemia. After colon surgery, her Hg was 9.4. -resolved since colectomy in 01/2021.  -her iron and hgb remain WNL, but her ferritin dropped to 9 on 09/20/21 lab work. She notes she cannot tolerate oral iron.   5. Comorbidities: HTN, COPD, Hypothyroidism with remote history of thyroid cancer, Arthritis -She has chronic left knee pain and use to have repeated injections. She notes since long hospitalization she has left leg pain and weakness that radiates to her lower back.  6. Social Support -Her husband and 2 children passed away years ago. She lives alone but has help form her 2 older sisters and her niece Quita Skye and Friend. Her niece will be her first point of contact. -She notes she  lives off social security and has insurance with [employer]. We will find financial assistance if needed.      PLAN:  -I refilled her folic acid per her request -I discussed the option of restarting Xeloda vs hospice with her, she will think about it and call me back in next few weeks    No problem-specific Assessment & Plan notes found for this encounter.   SUMMARY OF ONCOLOGIC HISTORY: Oncology History Overview Note  Cancer Staging Cancer of ascending colon pT3, pN2b s/p lap proximal colectomy 02/02/2021 Staging form: Colon and Rectum, AJCC 8th Edition - Pathologic stage from 02/02/2021: Stage IIIC (pT3, pN2b, cM0) - Signed by Truitt Merle, MD on 03/09/2021 Stage prefix: Initial diagnosis Histologic grading system: 4 grade system Histologic grade (G): G2 Residual tumor (R): R0 - None    Cancer of ascending colon pT3, pN2b s/p lap proximal colectomy 02/02/2021   Initial Diagnosis   Cancer of ascending colon pT3, pN2b s/p lap proximal colectomy 02/02/2021   01/06/2021 Imaging   CT AP  IMPRESSION: 1. Abnormal thickening and luminal narrowing involving the cecum and ascending colon up to roughly the level of the mid ascending colon. There also appears to be contiguous thickening in the terminal ileum. Differential considerations include carcinoma, inflammatory bowel disease or focal colitis also involving the terminal ileum. No evidence of associated small bowel obstruction, abscess or bowel perforation. Correlation with evidence of blood in the stool is recommended as well as direct visualization with colonoscopy. 2. Stable large hiatal hernia. 3. Aortic atherosclerosis without evidence of aneurysm.   Aortic Atherosclerosis (ICD10-I70.0).   01/26/2021 Imaging   IMPRESSION: High-grade proximal large bowel obstruction involving the mid ascending colon. Given the extensive inflammatory changes noted on prior examination, this may represent a post inflammatory stricture, however,  correlation with endoscopy is recommended as an underlying neoplasm could appear similarly.   Mild right nonobstructing nephrolithiasis.   Large hiatal hernia   Aortic Atherosclerosis (ICD10-I70.0).     01/29/2021 Procedure   Colonoscopy by Dr Therisa Doyne  IMPRESSION - Preparation of the colon was poor. - Likely malignant completely obstructing tumor in the distal ascending colon. Biopsied. Tattooed. - Stool in the entire examined colon. - Diverticulosis in the sigmoid colon and in the descending colon.   01/29/2021 Initial Biopsy   FINAL MICROSCOPIC DIAGNOSIS:   A. ASCENDING COLON BIOPSY:  - Moderately differentiated colorectal adenocarcinoma.  - See comment.   COMMENT:  Dr. Melina Copa agrees.      01/30/2021 Imaging   CT Chest  IMPRESSION: 1. There are multiple small bilateral pulmonary nodules, measuring 6 mm and smaller, nonspecific although suspicious for pulmonary metastatic disease. These are new compared to very remote prior CT of the chest dated 08/11/2008 and likely below size threshold for confident PET-CT characterization. 2. Prominent mediastinal lymph nodes, similar in appearance to remote prior CT dated 08/11/2008 and likely benign and reactive. 3. Moderate hiatal hernia with intrathoracic position of the gastric fundus. 4. Esophagogastric tube, tip positioned in the duodenal bulb.   02/02/2021 Surgery   Laparoscopic Assisted Partial Colectomy by Dr Barry Dienes    02/02/2021 Pathology Results   FINAL MICROSCOPIC DIAGNOSIS:   A. COLON, RIGHT, RESECTION:  - Invasive colorectal adenocarcinoma, 5.2 cm.  - Carcinoma extends into pericolonic  connective tissue.  - Margins not involved.  - Metastatic carcinoma in seven of twenty eight lymph nodes (7/28).  - One extramural satellite nodule.  - See oncology table.   MSI -Stable   Mismatch repair normal   Preserved nuclear expressoin of MLH1, MSHS2, MSH6, PMS2    02/02/2021 Cancer Staging   Staging form: Colon and  Rectum, AJCC 8th Edition - Pathologic stage from 02/02/2021: Stage IIIC (pT3, pN2b, cM0) - Signed by Truitt Merle, MD on 03/09/2021 Stage prefix: Initial diagnosis Histologic grading system: 4 grade system Histologic grade (G): G2 Residual tumor (R): R0 - None    03/28/2021 -  Chemotherapy   PENDING Adjuvant Xeloda 1054m in the AM and 1509min the PM 2 weeks on/1 week off for 6 months starting 03/28/21.        05/06/2021 Imaging   CT Chest w/o contrast  IMPRESSION: Bilateral pulmonary nodules, mildly progressive, suspicious for metastases.   Aortic Atherosclerosis (ICD10-I70.0) and Emphysema (ICD10-J43.9).   09/20/2021 Imaging   EXAM: CT CHEST, ABDOMEN, AND PELVIS WITH CONTRAST  IMPRESSION: 1. Further progression in diffuse pulmonary metastatic disease. 2. 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 3. No evidence of hepatic metastatic disease. 4. Stable incidental findings as detailed above.      INTERVAL HISTORY:  Angelica KEILs here for a follow up of colon cancer. She was last seen by me on 06/23/21. She presents to the clinic accompanied by her sister. She came in a wheelchair today.  She reports she is doing well overall; she notes her Christmas was quiet. She reports a hardness to her incision site, which she feels is related to a hernia. She states she first noticed it about a week ago. So significant pain or N/V or bloating.  She reports she has been taking folic acid but ran out recently. She notes her stomach felt better when she was taking it.   All other systems were reviewed with the patient and are negative.  MEDICAL HISTORY:  Past Medical History:  Diagnosis Date   Anemia    Arthritis    Asthma    Celiac disease 11/17/2013   [place]   [place]2    Hypertension    Rectal bleed 10/23/2018   [place]   [place]    SURGICAL HISTORY: Past Surgical History:  Procedure Laterality Date   ABDOMINAL HYSTERECTOMY     BIOPSY  01/29/2021   Procedure: BIOPSY;  Surgeon: KaRonnette JuniperMD;  [facility];  Service: Gastroenterology;;   BREAST EXCISIONAL BIOPSY Bilateral    CHOLECYSTECTOMY     COLONOSCOPY  07/05/2012   Procedure: COLONOSCOPY;  Surgeon: JaWinfield Cunas MD;  [facility];  Service: Endoscopy;  Laterality: N/A;   COLONOSCOPY N/A 03/12/2015   Procedure: COLONOSCOPY;  Surgeon: JaLaurence SpatesMD;  Location: [facility];  Service: Endoscopy;  Laterality: N/A;   COLONOSCOPY N/A 01/29/2021   Procedure: COLONOSCOPY;  Surgeon: KaRonnette JuniperMD;  [facility];  Service: Gastroenterology;  Laterality: N/A;   ESOPHAGOGASTRODUODENOSCOPY  07/05/2012   Procedure: ESOPHAGOGASTRODUODENOSCOPY (EGD);  Surgeon: JaWinfield Cunas MD;  [facility];  Service: Endoscopy;  Laterality: N/A;   HOT HEMOSTASIS N/A 03/12/2015   Procedure: HOT HEMOSTASIS (ARGON PLASMA COAGULATION/BICAP);  Surgeon: JaLaurence SpatesMD;  [facility];  Service: Endoscopy;  Laterality: N/A;   LAPAROSCOPIC PARTIAL COLECTOMY N/A 02/02/2021   Procedure: LAPAROSCOPIC ASSISTED PARTIAL COLECTOMY;  Surgeon: Stark Klein, MD;  Location[facility];  Service: General;  Laterality: N/A;   SUBMUCOSAL TATTOO INJECTION  01/29/2021   Procedure: SUBMUCOSAL TATTOO INJECTION;  Surgeon: Ronnette Juniper, MD;  Location: [facility];  Service: Gastroenterology;;   TONSILLECTOMY      I have reviewed the social history and family history with the patient and they are unchanged from previous note.  ALLERGIES:  is allergic to other, alendronate, fosamax [alendronate sodium], penicillins, sulfa antibiotics, zoledronic acid, and codeine.  MEDICATIONS:  Current Outpatient Medications  Medication Sig Dispense Refill   folic acid (FOLVITE) 1 MG tablet  Take 1 tablet (1 mg total) by mouth daily. 30 tablet 3   acetaminophen (TYLENOL) 325 MG tablet Take 2 tablets (650 mg total) by mouth every 6 (six) hours as needed for mild pain or fever.     albuterol (PROVENTIL HFA;VENTOLIN HFA) 108 (90 Base) MCG/ACT inhaler Inhale into the lungs every 6 (six) hours as needed for wheezing or shortness of breath.     docusate sodium (COLACE) 250 MG capsule Take 250 mg by mouth daily as needed for constipation.     folic acid (FOLVITE) 1 MG tablet Take 1 tablet (1 mg total) by mouth daily. 30 tablet 1   hydrocortisone (ANUSOL-HC) 25 MG suppository Place 1 suppository (25 mg total) rectally 2 (two) times daily as needed for hemorrhoids or anal itching. (Patient not taking: Reported on 01/27/2021) 12 suppository 0   levothyroxine (SYNTHROID, LEVOTHROID) 125 MCG tablet Take 125 mcg by mouth daily.     ondansetron (ZOFRAN ODT) 8 MG disintegrating tablet Take 1 tablet (8 mg total) by mouth every 8 (eight) hours as needed. 8 tablet 0   OXYGEN Inhale 2 L into the lungs as needed (LOW OXYGEN).     polyethylene glycol (MIRALAX) 17 g packet Take 17 g by mouth daily as needed for mild constipation. 14 each 0   traMADol (ULTRAM) 50 MG tablet Take 1 tablet (50 mg total) by mouth every 6 (six) hours as needed for moderate pain or severe pain (50 mg for moderate, 100 mg for severe). 10 tablet 0   No current facility-administered medications for this visit.    PHYSICAL EXAMINATION: ECOG PERFORMANCE STATUS: 3 - Symptomatic, >50% confined to bed  Vitals:   09/22/21 1353  BP: (!) 151/75  Pulse: 81  Resp: 18  Temp: 98.1 F (36.7 C)  SpO2: 97%   Wt Readings from Last 3 Encounters:  09/22/21 200 lb 9.6 oz (91 kg)  06/23/21 193 lb 4.8 oz (87.7 kg)  05/27/21 189 lb 4.8 oz (85.9 kg)     GENERAL:alert, no distress and comfortable SKIN: skin color normal, no rashes or significant lesions EYES: normal, Conjunctiva are pink and non-injected, sclera clear  NEURO: alert &  oriented x 3 with fluent speech  LABORATORY DATA:  I have reviewed the data as listed CBC Latest Ref Rng & Units 09/20/2021 06/23/2021 05/27/2021  WBC 4.0 - 10.5 K/uL 8.2 8.4 8.7  Hemoglobin 12.0 - 15.0 g/dL 12.9 12.7 13.4  Hematocrit 36.0 - 46.0 % 40.8 38.0 40.2  Platelets 150 - 400 K/uL 262 253 230     CMP Latest Ref Rng & Units 09/20/2021 06/23/2021 05/27/2021  Glucose 70 - 99 mg/dL 95 92 109(H)  BUN 8 - 23 mg/dL _0 Creatinine 0.44 - 1.00 mg/dL 0.79 0.78 0.86  Sodium 135 -  145 mmol/L 140 144 144  Potassium 3.5 - 5.1 mmol/L 3.4(L) 3.4(L) 3.8  Chloride 98 - 111 mmol/L 105 106 105  CO2 22 - 32 mmol/L _0 Calcium 8.9 - 10.3 mg/dL 9.2 9.0 9.4  Total Protein 6.5 - 8.1 g/dL 7.0 7.0 7.0  Total Bilirubin 0.3 - 1.2 mg/dL 0.9 0.8 0.9  Alkaline Phos 38 - 126 U/L 59 62 61  AST 15 - 41 U/L 12(L) 14(L) 13(L)  ALT 0 - 44 U/L _1 RADIOGRAPHIC STUDIES: I have personally reviewed the radiological images as listed and agreed with the findings in the report. No results found.    No orders of the defined types were placed in this encounter.  All questions were answered. The patient knows to call the clinic with any problems, questions or concerns. No barriers to learning was detected. The total time spent in the appointment was 40 minutes.     Truitt Merle, MD 09/22/2021   I, Wilburn Mylar, am acting as scribe for Truitt Merle, MD.   I have reviewed the above documentation for accuracy and completeness, and I agree with the above.

## 2022-01-01 ENCOUNTER — Other Ambulatory Visit: Payer: Self-pay | Admitting: Hematology

## 2022-03-30 ENCOUNTER — Other Ambulatory Visit: Payer: Self-pay | Admitting: Internal Medicine

## 2022-03-30 DIAGNOSIS — C772 Secondary and unspecified malignant neoplasm of intra-abdominal lymph nodes: Secondary | ICD-10-CM

## 2022-03-30 DIAGNOSIS — R918 Other nonspecific abnormal finding of lung field: Secondary | ICD-10-CM

## 2022-03-30 DIAGNOSIS — C189 Malignant neoplasm of colon, unspecified: Secondary | ICD-10-CM

## 2022-04-10 ENCOUNTER — Ambulatory Visit
Admission: RE | Admit: 2022-04-10 | Discharge: 2022-04-10 | Disposition: A | Discharge status: Home / Self Care | Payer: Medicare Other | Source: Ambulatory Visit | Attending: Internal Medicine | Admitting: Internal Medicine

## 2022-04-10 DIAGNOSIS — C772 Secondary and unspecified malignant neoplasm of intra-abdominal lymph nodes: Secondary | ICD-10-CM

## 2022-04-10 DIAGNOSIS — C189 Malignant neoplasm of colon, unspecified: Secondary | ICD-10-CM

## 2022-04-10 DIAGNOSIS — R918 Other nonspecific abnormal finding of lung field: Secondary | ICD-10-CM

## 2022-04-10 MED ORDER — IOPAMIDOL (ISOVUE-300) INJECTION 61%
100.0000 mL | Freq: Once | INTRAVENOUS | Status: AC | PRN
  Administered 2022-04-10: 100 mL via INTRAVENOUS

## 2022-09-25 DEATH — deceased
